# Patient Record
Sex: Female | Born: 1965 | Race: White | Hispanic: No | Marital: Married | Smoking: Former smoker
Health system: Southern US, Community
[De-identification: ages and names within clinical notes are randomized; demographics above are authoritative.]

## PROBLEM LIST (undated history)

## (undated) DIAGNOSIS — R3129 Other microscopic hematuria: Secondary | ICD-10-CM

## (undated) DIAGNOSIS — Z9889 Other specified postprocedural states: Secondary | ICD-10-CM

## (undated) DIAGNOSIS — Z7901 Long term (current) use of anticoagulants: Secondary | ICD-10-CM

## (undated) DIAGNOSIS — M1990 Unspecified osteoarthritis, unspecified site: Secondary | ICD-10-CM

## (undated) DIAGNOSIS — Z8742 Personal history of other diseases of the female genital tract: Secondary | ICD-10-CM

## (undated) DIAGNOSIS — Z87442 Personal history of urinary calculi: Secondary | ICD-10-CM

## (undated) DIAGNOSIS — R112 Nausea with vomiting, unspecified: Secondary | ICD-10-CM

## (undated) DIAGNOSIS — K602 Anal fissure, unspecified: Secondary | ICD-10-CM

## (undated) DIAGNOSIS — T7840XA Allergy, unspecified, initial encounter: Secondary | ICD-10-CM

## (undated) DIAGNOSIS — M25511 Pain in right shoulder: Secondary | ICD-10-CM

## (undated) DIAGNOSIS — I499 Cardiac arrhythmia, unspecified: Secondary | ICD-10-CM

## (undated) DIAGNOSIS — E785 Hyperlipidemia, unspecified: Secondary | ICD-10-CM

## (undated) DIAGNOSIS — E119 Type 2 diabetes mellitus without complications: Secondary | ICD-10-CM

## (undated) DIAGNOSIS — Z8489 Family history of other specified conditions: Secondary | ICD-10-CM

## (undated) DIAGNOSIS — Z8719 Personal history of other diseases of the digestive system: Secondary | ICD-10-CM

## (undated) DIAGNOSIS — G8929 Other chronic pain: Secondary | ICD-10-CM

## (undated) DIAGNOSIS — K589 Irritable bowel syndrome without diarrhea: Secondary | ICD-10-CM

## (undated) DIAGNOSIS — L719 Rosacea, unspecified: Secondary | ICD-10-CM

## (undated) DIAGNOSIS — I480 Paroxysmal atrial fibrillation: Secondary | ICD-10-CM

## (undated) DIAGNOSIS — F419 Anxiety disorder, unspecified: Secondary | ICD-10-CM

## (undated) DIAGNOSIS — E282 Polycystic ovarian syndrome: Secondary | ICD-10-CM

## (undated) DIAGNOSIS — N201 Calculus of ureter: Secondary | ICD-10-CM

## (undated) DIAGNOSIS — I10 Essential (primary) hypertension: Secondary | ICD-10-CM

## (undated) DIAGNOSIS — R011 Cardiac murmur, unspecified: Secondary | ICD-10-CM

## (undated) DIAGNOSIS — G2581 Restless legs syndrome: Secondary | ICD-10-CM

## (undated) HISTORY — DX: Anal fissure, unspecified: K60.2

## (undated) HISTORY — DX: Other microscopic hematuria: R31.29

## (undated) HISTORY — PX: CARPAL TUNNEL RELEASE: SHX101

## (undated) HISTORY — DX: Polycystic ovarian syndrome: E28.2

## (undated) HISTORY — DX: Irritable bowel syndrome, unspecified: K58.9

## (undated) HISTORY — DX: Essential (primary) hypertension: I10

## (undated) HISTORY — DX: Allergy, unspecified, initial encounter: T78.40XA

## (undated) HISTORY — PX: KNEE ARTHROSCOPY: SUR90

## (undated) HISTORY — DX: Hyperlipidemia, unspecified: E78.5

## (undated) HISTORY — PX: OTHER SURGICAL HISTORY: SHX169

## (undated) HISTORY — PX: INTRAUTERINE DEVICE (IUD) INSERTION: SHX5877

## (undated) HISTORY — DX: Unspecified osteoarthritis, unspecified site: M19.90

## (undated) HISTORY — PX: WISDOM TOOTH EXTRACTION: SHX21

## (undated) HISTORY — DX: Personal history of other diseases of the female genital tract: Z87.42

## (undated) HISTORY — DX: Type 2 diabetes mellitus without complications: E11.9

## (undated) HISTORY — PX: HAND TENDON SURGERY: SHX663

## (undated) HISTORY — DX: Rosacea, unspecified: L71.9

---

## 1997-01-13 ENCOUNTER — Encounter: Payer: Self-pay | Admitting: Internal Medicine

## 1997-01-13 LAB — CONVERTED CEMR LAB

## 1998-05-16 ENCOUNTER — Other Ambulatory Visit: Admission: RE | Admit: 1998-05-16 | Discharge: 1998-05-16 | Payer: Self-pay | Admitting: *Deleted

## 1999-09-23 ENCOUNTER — Other Ambulatory Visit: Admission: RE | Admit: 1999-09-23 | Discharge: 1999-09-23 | Payer: Self-pay | Admitting: *Deleted

## 2000-12-02 ENCOUNTER — Other Ambulatory Visit: Admission: RE | Admit: 2000-12-02 | Discharge: 2000-12-02 | Payer: Self-pay | Admitting: *Deleted

## 2002-04-03 ENCOUNTER — Other Ambulatory Visit: Admission: RE | Admit: 2002-04-03 | Discharge: 2002-04-03 | Payer: Self-pay | Admitting: Internal Medicine

## 2003-04-16 ENCOUNTER — Other Ambulatory Visit: Admission: RE | Admit: 2003-04-16 | Discharge: 2003-04-16 | Payer: Self-pay | Admitting: *Deleted

## 2003-04-29 ENCOUNTER — Encounter: Payer: Self-pay | Admitting: Internal Medicine

## 2004-12-13 HISTORY — PX: CARPAL TUNNEL RELEASE: SHX101

## 2004-12-21 ENCOUNTER — Other Ambulatory Visit: Admission: RE | Admit: 2004-12-21 | Discharge: 2004-12-21 | Payer: Self-pay | Admitting: Obstetrics and Gynecology

## 2005-05-12 ENCOUNTER — Ambulatory Visit (HOSPITAL_BASED_OUTPATIENT_CLINIC_OR_DEPARTMENT_OTHER): Admission: RE | Admit: 2005-05-12 | Discharge: 2005-05-12 | Payer: Self-pay | Admitting: *Deleted

## 2005-05-12 ENCOUNTER — Ambulatory Visit (HOSPITAL_COMMUNITY): Admission: RE | Admit: 2005-05-12 | Discharge: 2005-05-12 | Payer: Self-pay | Admitting: *Deleted

## 2005-12-22 ENCOUNTER — Ambulatory Visit: Payer: Self-pay | Admitting: Internal Medicine

## 2005-12-28 ENCOUNTER — Ambulatory Visit: Payer: Self-pay | Admitting: Internal Medicine

## 2006-02-01 ENCOUNTER — Encounter: Admission: RE | Admit: 2006-02-01 | Discharge: 2006-02-01 | Payer: Self-pay | Admitting: Orthopaedic Surgery

## 2006-02-08 ENCOUNTER — Encounter: Admission: RE | Admit: 2006-02-08 | Discharge: 2006-02-08 | Payer: Self-pay | Admitting: Orthopaedic Surgery

## 2006-09-15 ENCOUNTER — Other Ambulatory Visit: Admission: RE | Admit: 2006-09-15 | Discharge: 2006-09-15 | Payer: Self-pay | Admitting: Obstetrics and Gynecology

## 2006-11-11 ENCOUNTER — Encounter: Payer: Self-pay | Admitting: Internal Medicine

## 2006-11-11 ENCOUNTER — Ambulatory Visit: Payer: Self-pay | Admitting: Cardiology

## 2006-11-22 ENCOUNTER — Ambulatory Visit: Payer: Self-pay

## 2006-11-22 ENCOUNTER — Encounter: Payer: Self-pay | Admitting: Cardiology

## 2006-11-24 ENCOUNTER — Ambulatory Visit: Payer: Self-pay | Admitting: Cardiology

## 2007-01-02 ENCOUNTER — Ambulatory Visit: Payer: Self-pay | Admitting: Internal Medicine

## 2007-01-02 LAB — CONVERTED CEMR LAB
AST: 18 units/L (ref 0–37)
BUN: 12 mg/dL (ref 6–23)
Calcium: 9.2 mg/dL (ref 8.4–10.5)
Cholesterol: 214 mg/dL (ref 0–200)
Direct LDL: 153.5 mg/dL
GFR calc Af Amer: 90 mL/min
GFR calc non Af Amer: 74 mL/min
HDL: 47.4 mg/dL (ref >39.0)
Potassium: 5.2 meq/L — ABNORMAL HIGH (ref 3.5–5.1)
Total CHOL/HDL Ratio: 4.5
Triglycerides: 55 mg/dL (ref 0–149)
VLDL: 11 mg/dL (ref 0–40)

## 2007-06-19 ENCOUNTER — Ambulatory Visit: Payer: Self-pay | Admitting: Internal Medicine

## 2007-06-19 LAB — CONVERTED CEMR LAB
BUN: 14 mg/dL (ref 6–23)
Basophils Absolute: 0 10*3/uL (ref 0.0–0.1)
Basophils Relative: 0.8 % (ref 0.0–1.0)
CO2: 30 meq/L (ref 19–32)
Calcium: 9 mg/dL (ref 8.4–10.5)
Creatinine, Ser: 0.8 mg/dL (ref 0.4–1.2)
Direct LDL: 157 mg/dL
Free T4: 0.9 ng/dL (ref 0.6–1.6)
GFR calc non Af Amer: 85 mL/min
Glucose, Bld: 91 mg/dL (ref 70–99)
HDL: 39.5 mg/dL (ref >39.0)
Hgb A1c MFr Bld: 5.7 % (ref 4.6–6.0)
Lymphocytes Relative: 32 % (ref 12.0–46.0)
MCHC: 33.4 g/dL (ref 30.0–36.0)
Platelets: 214 10*3/uL (ref 150–400)
RDW: 11.8 % (ref 11.5–14.6)
Sodium: 142 meq/L (ref 135–145)
TSH: 1.91 microintl units/mL (ref 0.35–5.50)

## 2007-07-03 ENCOUNTER — Telehealth: Payer: Self-pay | Admitting: Internal Medicine

## 2007-07-18 ENCOUNTER — Encounter: Payer: Self-pay | Admitting: Internal Medicine

## 2007-07-18 DIAGNOSIS — I10 Essential (primary) hypertension: Secondary | ICD-10-CM | POA: Insufficient documentation

## 2007-07-18 DIAGNOSIS — E785 Hyperlipidemia, unspecified: Secondary | ICD-10-CM | POA: Insufficient documentation

## 2007-07-21 ENCOUNTER — Inpatient Hospital Stay (HOSPITAL_COMMUNITY): Admission: AD | Admit: 2007-07-21 | Discharge: 2007-07-21 | Payer: Self-pay | Admitting: Obstetrics and Gynecology

## 2007-07-24 ENCOUNTER — Telehealth: Payer: Self-pay | Admitting: Internal Medicine

## 2007-08-22 ENCOUNTER — Telehealth: Payer: Self-pay | Admitting: Internal Medicine

## 2007-10-16 ENCOUNTER — Ambulatory Visit: Payer: Self-pay | Admitting: Internal Medicine

## 2007-10-16 DIAGNOSIS — N979 Female infertility, unspecified: Secondary | ICD-10-CM | POA: Insufficient documentation

## 2007-10-16 DIAGNOSIS — E282 Polycystic ovarian syndrome: Secondary | ICD-10-CM | POA: Insufficient documentation

## 2007-10-18 ENCOUNTER — Encounter: Payer: Self-pay | Admitting: Internal Medicine

## 2007-10-24 LAB — CONVERTED CEMR LAB
CO2: 29 meq/L (ref 19–32)
Chloride: 105 meq/L (ref 96–112)
Cholesterol: 203 mg/dL (ref 0–200)
Direct LDL: 150.5 mg/dL
Potassium: 4.1 meq/L (ref 3.5–5.1)
Sodium: 142 meq/L (ref 135–145)
Total CHOL/HDL Ratio: 5.6
Triglycerides: 62 mg/dL (ref 0–149)
VLDL: 12 mg/dL (ref 0–40)

## 2007-11-20 ENCOUNTER — Ambulatory Visit: Payer: Self-pay | Admitting: Internal Medicine

## 2007-11-20 LAB — CONVERTED CEMR LAB: LDL Goal: 130 mg/dL

## 2007-11-22 ENCOUNTER — Encounter: Payer: Self-pay | Admitting: Internal Medicine

## 2007-11-23 ENCOUNTER — Telehealth: Payer: Self-pay | Admitting: Internal Medicine

## 2007-12-19 ENCOUNTER — Ambulatory Visit: Payer: Self-pay | Admitting: Internal Medicine

## 2008-01-15 ENCOUNTER — Ambulatory Visit: Payer: Self-pay | Admitting: Internal Medicine

## 2008-01-19 ENCOUNTER — Telehealth: Payer: Self-pay | Admitting: Internal Medicine

## 2008-02-06 ENCOUNTER — Telehealth: Payer: Self-pay | Admitting: Family Medicine

## 2008-03-18 ENCOUNTER — Telehealth: Payer: Self-pay | Admitting: Internal Medicine

## 2008-04-04 ENCOUNTER — Telehealth: Payer: Self-pay | Admitting: *Deleted

## 2008-04-05 ENCOUNTER — Telehealth: Payer: Self-pay | Admitting: Internal Medicine

## 2008-04-09 ENCOUNTER — Telehealth: Payer: Self-pay | Admitting: Internal Medicine

## 2008-04-12 ENCOUNTER — Ambulatory Visit: Payer: Self-pay | Admitting: Internal Medicine

## 2008-04-30 ENCOUNTER — Ambulatory Visit: Payer: Self-pay | Admitting: Gastroenterology

## 2008-04-30 DIAGNOSIS — M129 Arthropathy, unspecified: Secondary | ICD-10-CM | POA: Insufficient documentation

## 2008-04-30 LAB — CONVERTED CEMR LAB
Basophils Absolute: 0 10*3/uL (ref 0.0–0.1)
Basophils Relative: 0.4 % (ref 0.0–1.0)
Eosinophils Relative: 3.4 % (ref 0.0–5.0)
HCT: 43.9 % (ref 36.0–46.0)
Lymphocytes Relative: 22.8 % (ref 12.0–46.0)
Monocytes Relative: 8.4 % (ref 3.0–12.0)
Platelets: 219 10*3/uL (ref 150–400)
RBC: 4.65 M/uL (ref 3.87–5.11)
RDW: 11.8 % (ref 11.5–14.6)

## 2008-05-01 ENCOUNTER — Ambulatory Visit: Payer: Self-pay | Admitting: Gastroenterology

## 2008-05-07 ENCOUNTER — Telehealth: Payer: Self-pay | Admitting: Gastroenterology

## 2008-05-21 ENCOUNTER — Telehealth: Payer: Self-pay | Admitting: Gastroenterology

## 2008-05-23 ENCOUNTER — Telehealth: Payer: Self-pay | Admitting: Gastroenterology

## 2008-06-04 ENCOUNTER — Ambulatory Visit: Payer: Self-pay | Admitting: Gastroenterology

## 2008-07-22 ENCOUNTER — Telehealth: Payer: Self-pay | Admitting: Gastroenterology

## 2008-08-09 ENCOUNTER — Telehealth: Payer: Self-pay | Admitting: Gastroenterology

## 2008-08-20 ENCOUNTER — Ambulatory Visit: Payer: Self-pay | Admitting: Gastroenterology

## 2008-08-29 ENCOUNTER — Ambulatory Visit: Payer: Self-pay | Admitting: Internal Medicine

## 2008-09-02 ENCOUNTER — Telehealth: Payer: Self-pay | Admitting: Internal Medicine

## 2008-09-09 ENCOUNTER — Telehealth: Payer: Self-pay | Admitting: Gastroenterology

## 2008-09-09 ENCOUNTER — Telehealth: Payer: Self-pay | Admitting: *Deleted

## 2008-09-17 ENCOUNTER — Telehealth: Payer: Self-pay | Admitting: Internal Medicine

## 2008-09-30 ENCOUNTER — Ambulatory Visit: Payer: Self-pay | Admitting: Internal Medicine

## 2008-09-30 DIAGNOSIS — J209 Acute bronchitis, unspecified: Secondary | ICD-10-CM | POA: Insufficient documentation

## 2008-10-16 ENCOUNTER — Telehealth: Payer: Self-pay | Admitting: Gastroenterology

## 2008-10-23 ENCOUNTER — Ambulatory Visit: Payer: Self-pay | Admitting: Internal Medicine

## 2008-11-11 ENCOUNTER — Telehealth: Payer: Self-pay | Admitting: Internal Medicine

## 2008-11-18 ENCOUNTER — Ambulatory Visit: Payer: Self-pay | Admitting: Internal Medicine

## 2008-11-18 LAB — CONVERTED CEMR LAB
Bilirubin Urine: NEGATIVE
Blood in Urine, dipstick: NEGATIVE
Glucose, Urine, Semiquant: NEGATIVE
Ketones, urine, test strip: NEGATIVE
Nitrite: NEGATIVE
Protein, U semiquant: NEGATIVE
Specific Gravity, Urine: 1.02
Urobilinogen, UA: 0.2
WBC Urine, dipstick: NEGATIVE
pH: 7

## 2008-11-19 ENCOUNTER — Encounter: Payer: Self-pay | Admitting: Internal Medicine

## 2008-11-19 LAB — CONVERTED CEMR LAB
AST: 19 units/L (ref 0–37)
Albumin: 3.8 g/dL (ref 3.5–5.2)
BUN: 14 mg/dL (ref 6–23)
Basophils Absolute: 0 10*3/uL (ref 0.0–0.1)
Basophils Relative: 0.9 % (ref 0.0–3.0)
Calcium: 9 mg/dL (ref 8.4–10.5)
Cholesterol: 187 mg/dL (ref 0–200)
Creatinine, Ser: 0.7 mg/dL (ref 0.4–1.2)
Eosinophils Absolute: 0.2 10*3/uL (ref 0.0–0.7)
GFR calc non Af Amer: 99 mL/min
HCT: 43.5 % (ref 36.0–46.0)
Lymphocytes Relative: 29.4 % (ref 12.0–46.0)
MCHC: 34.9 g/dL (ref 30.0–36.0)
MCV: 94.7 fL (ref 78.0–100.0)
Monocytes Relative: 9.9 % (ref 3.0–12.0)
Neutrophils Relative %: 56.8 % (ref 43.0–77.0)
Platelets: 202 10*3/uL (ref 150–400)
RDW: 12.5 % (ref 11.5–14.6)
TSH: 2.3 microintl units/mL (ref 0.35–5.50)
Triglycerides: 37 mg/dL (ref 0–149)
WBC: 5.5 10*3/uL (ref 4.5–10.5)

## 2008-12-13 LAB — HM PAP SMEAR: HM Pap smear: NORMAL

## 2008-12-18 ENCOUNTER — Encounter: Admission: RE | Admit: 2008-12-18 | Discharge: 2008-12-18 | Payer: Self-pay | Admitting: Obstetrics and Gynecology

## 2008-12-26 ENCOUNTER — Telehealth: Payer: Self-pay | Admitting: *Deleted

## 2008-12-27 ENCOUNTER — Encounter: Payer: Self-pay | Admitting: Internal Medicine

## 2008-12-27 ENCOUNTER — Telehealth: Payer: Self-pay | Admitting: *Deleted

## 2009-01-24 ENCOUNTER — Telehealth: Payer: Self-pay | Admitting: Gastroenterology

## 2009-01-27 ENCOUNTER — Encounter: Payer: Self-pay | Admitting: Gastroenterology

## 2009-02-21 ENCOUNTER — Encounter: Admission: RE | Admit: 2009-02-21 | Discharge: 2009-02-21 | Payer: Self-pay | Admitting: Family Medicine

## 2009-03-10 ENCOUNTER — Telehealth: Payer: Self-pay | Admitting: Internal Medicine

## 2009-03-27 ENCOUNTER — Telehealth: Payer: Self-pay | Admitting: *Deleted

## 2009-04-21 ENCOUNTER — Ambulatory Visit: Payer: Self-pay | Admitting: Internal Medicine

## 2009-05-19 ENCOUNTER — Telehealth: Payer: Self-pay | Admitting: *Deleted

## 2009-06-05 ENCOUNTER — Telehealth: Payer: Self-pay | Admitting: Gastroenterology

## 2009-06-30 ENCOUNTER — Telehealth: Payer: Self-pay | Admitting: Internal Medicine

## 2009-07-15 ENCOUNTER — Ambulatory Visit: Payer: Self-pay | Admitting: Internal Medicine

## 2009-07-21 ENCOUNTER — Encounter: Payer: Self-pay | Admitting: Internal Medicine

## 2009-07-24 ENCOUNTER — Telehealth: Payer: Self-pay | Admitting: *Deleted

## 2009-09-17 ENCOUNTER — Telehealth: Payer: Self-pay | Admitting: *Deleted

## 2009-09-23 ENCOUNTER — Ambulatory Visit: Payer: Self-pay | Admitting: Internal Medicine

## 2009-09-23 DIAGNOSIS — R002 Palpitations: Secondary | ICD-10-CM | POA: Insufficient documentation

## 2009-09-23 DIAGNOSIS — Z87891 Personal history of nicotine dependence: Secondary | ICD-10-CM | POA: Insufficient documentation

## 2009-11-18 ENCOUNTER — Ambulatory Visit: Payer: Self-pay | Admitting: Internal Medicine

## 2009-11-18 LAB — CONVERTED CEMR LAB
Albumin: 4 g/dL (ref 3.5–5.2)
BUN: 10 mg/dL (ref 6–23)
CO2: 28 meq/L (ref 19–32)
Calcium: 9 mg/dL (ref 8.4–10.5)
Chloride: 102 meq/L (ref 96–112)
Cholesterol: 201 mg/dL — ABNORMAL HIGH (ref 0–200)
Creatinine, Ser: 1 mg/dL (ref 0.4–1.2)
Direct LDL: 154.4 mg/dL
Eosinophils Relative: 3 % (ref 0.0–5.0)
HDL: 40.1 mg/dL (ref >39.00)
Lymphs Abs: 1.1 10*3/uL (ref 0.7–4.0)
Monocytes Absolute: 0.7 10*3/uL (ref 0.1–1.0)
Monocytes Relative: 13.4 % — ABNORMAL HIGH (ref 3.0–12.0)
Neutrophils Relative %: 61.5 % (ref 43.0–77.0)
Platelets: 184 10*3/uL (ref 150.0–400.0)
RBC: 4.62 M/uL (ref 3.87–5.11)
RDW: 11.8 % (ref 11.5–14.6)
Total CHOL/HDL Ratio: 5
Total Protein: 7.4 g/dL (ref 6.0–8.3)
WBC: 5 10*3/uL (ref 4.5–10.5)

## 2009-11-25 ENCOUNTER — Ambulatory Visit: Payer: Self-pay | Admitting: Internal Medicine

## 2009-11-25 DIAGNOSIS — R7309 Other abnormal glucose: Secondary | ICD-10-CM | POA: Insufficient documentation

## 2009-12-24 ENCOUNTER — Telehealth: Payer: Self-pay | Admitting: *Deleted

## 2009-12-29 ENCOUNTER — Ambulatory Visit: Payer: Self-pay | Admitting: Internal Medicine

## 2009-12-29 DIAGNOSIS — J45909 Unspecified asthma, uncomplicated: Secondary | ICD-10-CM | POA: Insufficient documentation

## 2010-01-28 ENCOUNTER — Telehealth: Payer: Self-pay | Admitting: Internal Medicine

## 2010-01-28 ENCOUNTER — Ambulatory Visit: Payer: Self-pay | Admitting: Internal Medicine

## 2010-03-06 ENCOUNTER — Telehealth: Payer: Self-pay | Admitting: Internal Medicine

## 2010-03-10 ENCOUNTER — Ambulatory Visit: Payer: Self-pay | Admitting: Internal Medicine

## 2010-03-10 DIAGNOSIS — L255 Unspecified contact dermatitis due to plants, except food: Secondary | ICD-10-CM | POA: Insufficient documentation

## 2010-03-27 ENCOUNTER — Telehealth: Payer: Self-pay | Admitting: Internal Medicine

## 2010-05-25 ENCOUNTER — Ambulatory Visit: Payer: Self-pay | Admitting: Family Medicine

## 2010-07-08 ENCOUNTER — Ambulatory Visit (HOSPITAL_BASED_OUTPATIENT_CLINIC_OR_DEPARTMENT_OTHER): Admission: RE | Admit: 2010-07-08 | Discharge: 2010-07-08 | Payer: Self-pay | Admitting: Orthopedic Surgery

## 2010-07-08 HISTORY — PX: KNEE ARTHROSCOPY: SUR90

## 2010-10-13 ENCOUNTER — Encounter: Payer: Self-pay | Admitting: Internal Medicine

## 2010-10-13 LAB — CONVERTED CEMR LAB: Pap Smear: NORMAL

## 2010-11-06 ENCOUNTER — Encounter: Payer: Self-pay | Admitting: Internal Medicine

## 2010-11-09 ENCOUNTER — Telehealth: Payer: Self-pay | Admitting: Internal Medicine

## 2010-11-11 ENCOUNTER — Ambulatory Visit: Payer: Self-pay | Admitting: Internal Medicine

## 2010-11-11 DIAGNOSIS — J042 Acute laryngotracheitis: Secondary | ICD-10-CM | POA: Insufficient documentation

## 2010-11-11 DIAGNOSIS — R062 Wheezing: Secondary | ICD-10-CM | POA: Insufficient documentation

## 2010-11-13 ENCOUNTER — Telehealth: Payer: Self-pay | Admitting: Internal Medicine

## 2010-11-19 ENCOUNTER — Telehealth: Payer: Self-pay | Admitting: *Deleted

## 2010-11-20 ENCOUNTER — Telehealth: Payer: Self-pay | Admitting: *Deleted

## 2010-11-23 ENCOUNTER — Telehealth: Payer: Self-pay | Admitting: Internal Medicine

## 2010-12-10 ENCOUNTER — Ambulatory Visit: Payer: Self-pay | Admitting: Internal Medicine

## 2010-12-10 LAB — CONVERTED CEMR LAB
AST: 22 units/L (ref 0–37)
Albumin: 3.9 g/dL (ref 3.5–5.2)
Alkaline Phosphatase: 79 units/L (ref 39–117)
BUN: 15 mg/dL (ref 6–23)
Bilirubin, Direct: 0.1 mg/dL (ref 0.0–0.3)
CO2: 25 meq/L (ref 19–32)
Calcium: 8.6 mg/dL (ref 8.4–10.5)
Chloride: 101 meq/L (ref 96–112)
Creatinine, Ser: 0.8 mg/dL (ref 0.4–1.2)
Eosinophils Absolute: 0.2 10*3/uL (ref 0.0–0.7)
Eosinophils Relative: 2.9 % (ref 0.0–5.0)
GFR calc non Af Amer: 89.95 mL/min (ref >60.00)
MCV: 95.2 fL (ref 78.0–100.0)
Monocytes Relative: 10.4 % (ref 3.0–12.0)
Neutrophils Relative %: 58.5 % (ref 43.0–77.0)
Platelets: 242 10*3/uL (ref 150.0–400.0)
WBC: 5.8 10*3/uL (ref 4.5–10.5)

## 2011-01-05 ENCOUNTER — Encounter: Payer: Self-pay | Admitting: Internal Medicine

## 2011-01-05 ENCOUNTER — Ambulatory Visit
Admission: RE | Admit: 2011-01-05 | Discharge: 2011-01-05 | Payer: Self-pay | Source: Home / Self Care | Attending: Internal Medicine | Admitting: Internal Medicine

## 2011-01-05 DIAGNOSIS — E669 Obesity, unspecified: Secondary | ICD-10-CM | POA: Insufficient documentation

## 2011-01-08 ENCOUNTER — Ambulatory Visit
Admission: RE | Admit: 2011-01-08 | Discharge: 2011-01-08 | Payer: Self-pay | Source: Home / Self Care | Attending: Internal Medicine | Admitting: Internal Medicine

## 2011-01-10 LAB — CONVERTED CEMR LAB: Pap Smear: NORMAL

## 2011-01-14 NOTE — Progress Notes (Signed)
Summary: need refill on cardizem until mail order gets here  Phone Note Call from Patient Call back at [facility] Phone [phone]   Caller: Patient Summary of Call: Pt would like just [facility] pills [facility] due to her mail order company not getting it to her for a few days. Initial call taken by: Romualdo Bolk, CMA (AAMA),  December 24, 2009 2:05 PM  Follow-up for Phone Call        Pt aware that rx was sent. Follow-up by: Romualdo Bolk, CMA (AAMA),  December 24, 2009 2:06 PM    Prescriptions: CARDIZEM LA 180 MG  TB24 (DILTIAZEM HCL COATED BEADS) 1 by mouth once daily  #30 x 0   Entered by:   Romualdo Bolk, CMA (AAMA)   Authorized by:   Madelin Headings MD   Signed by:   Romualdo Bolk, CMA (AAMA) on 12/24/2009   Method used:   [facility] to        [facility] [phone]* (retail)       [address]       [place], [place]  [address]       Ph: [phone] or [phone]       Fax: [phone]   RxID:   [id number]

## 2011-01-14 NOTE — Progress Notes (Signed)
Summary: Pt needs to know if she should still use Albuterol?  Phone Note Call from Patient Call back at Home Phone [phone]   Caller: Patient Summary of Call: Pt is wondering if she can still use the Albuterol along with other meds given? Pls call.  Initial call taken by: Lucy Antigua,  November 13, 2010 3:52 PM  Follow-up for Phone Call        yes Follow-up by: Madelin Headings MD,  November 13, 2010 3:54 PM  Additional Follow-up for Phone Call Additional follow up Details #1::        called pt - informed what dr. Fabian Sharp said    KIK Additional Follow-up by: Duard Brady LPN,  November 13, 2010 5:26 PM

## 2011-01-14 NOTE — Assessment & Plan Note (Signed)
Summary: sore throat /wheezing/dm/pt rsc/cjr   Vital Signs:  Patient profile:   45 year old female Menstrual status:  irregular Weight:      183 pounds BMI:     36.47 O2 Sat:      97 % on Room air Temp:     98.6 degrees F oral Pulse rate:   106 / minute BP sitting:   172 / 84  (left arm) Cuff size:   regular  Vitals Entered By: Alfred Levins, CMA (November 11, 2010 1:42 PM)  O2 Flow:  Room air CC: st, wheezing, cough, laryngitis x1 wk   History of Present Illness: Gail [name] comes in today  for above . [facility] with inhaler etc.  now is "coughing so much  getting reflux" and coughing badly  that throat hurts a lot  last pm had 45 minute couhging fit .No current fever.  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  Taking albuterol and and  chlortrimeton.   and promethizine and not he[ing. as well as  tessalon perles   Bp had been normal.     but up in [facility].    Taking meds  needs refill   Preventive Screening-Counseling & Management  Alcohol-Tobacco     Alcohol drinks/day: <1     Alcohol type: wine     Smoking Status: quit     Year Quit: age 49  Caffeine-Diet-Exercise     Caffeine use/day: 0     Does Patient Exercise: no  Current Medications (verified): 1)  Cardizem La 180 Mg  Tb24 (Diltiazem Hcl Coated Beads) .Marland Kitchen.. 1 By Mouth Once Daily 2)  Zinc Oxide Cream 30 Grams One Tube .... Apply To Rectal Area Two Times A Day 3)  Miralax   Powd (Polyethylene Glycol 3350) .... Take One Scoop Mixed in 8 Ounces of Water in The Morning and At Bedtime. 4)  Asmanex 120 Metered Doses 220 Mcg/inh Aepb (Mometasone Furoate) .... 2 Puffs Two Times A Day 5)  Proair Hfa 108 (90 Base) Mcg/act Aers (Albuterol Sulfate) .... 2 Puffs Every 4 Hours As Needed 6)  Prilosec Otc 20 Mg Tbec (Omeprazole Magnesium) 7)  Boric Acid  Powd (Boric Acid) 8)  Mirena 20 Mcg/24hr Iud (Levonorgestrel) 9)  Nystatin 100000 Unit/gm Crea  (Nystatin)  Allergies (verified): 1)  ! Codeine 2)  Erythromycin Ethylsuccinate  Past History:  Past medical, surgical, family and social histories (including risk factors) reviewed, and no changes noted (except as noted below).  Past Medical History: Reviewed history from 01/28/2010 and no changes required. Hyperlipidemia Hypertension PCO Cardiology consult for palpitations nl echo Dr Diona Browner Anal Fissure Arthritis hx of allergies    as a child on shots but not   asthma.    rosacea?  Past Surgical History: Reviewed history from 06/04/2008 and no changes required. Carpal tunnel release x2 (bilateral) Wisdom teeth extraction Left knee arthroscopy Wrist tendon release bilateral thumbs  Family History: Reviewed history from 10/23/2008 and no changes required. Family History of CAD Female 1st degree relative  Family History Hypertension Family History Lung cancer uncle Family History of Breast Cancer: Great Aunt No FH of Colon Cancer     Social History: Reviewed history from 04/21/2009 and no changes required. Occupation:early childcare worker   Married Former Smoker-stopped at age 13 Alcohol Use - yes 3 drinks in a month Caffeine Use 2-3 drinks a month Illicit Drug Use - no  Patient does not  get regular exercise.    goin to school also for degree has new pet    Review of Systems       The patient complains of anorexia, hoarseness, chest pain, and prolonged cough.  The patient denies fever, weight loss, vision loss, decreased hearing, syncope, dyspnea on exertion, peripheral edema, headaches, abdominal pain, melena, difficulty walking, abnormal bleeding, enlarged lymph nodes, and angioedema.    Physical Exam  General:  very hoarse   in nad  hard to talk   no stridor   Head:  Normocephalic and atraumatic without obvious abnormalities. No apparent alopecia or balding. Eyes:  clear  Ears:  External ear exam shows no significant lesions or deformities.  Otoscopic  examination reveals clear canals, tympanic membranes are intact bilaterally without bulging, retraction, inflammation or discharge. Hearing is grossly normal bilaterally. Nose:  no external deformity and no external erythema.  mild congestion and face onn tender Mouth:  pharynx pink and moist.  no edema and nl airway Neck:  No deformities, masses, or tenderness noted. Lungs:  no intercostal retractions, no dullness, and no fremitus.  upper airway  wheeziness  with forced expiration  ? if wheezing lower.  soome improved after neb but very hoarse anduncomfortable Heart:  normal rate, regular rhythm, no murmur, and no gallop.   Abdomen:  Bowel sounds positive,abdomen soft and non-tender without masses, organomegaly or hernias noted. Pulses:  nl cap refill  Neurologic:  non focal  Skin:  turgor normal and color normal.   Cervical Nodes:  No lymphadenopathy noted Psych:  Oriented X3, good eye contact, not anxious appearing, and not depressed appearing.     Impression & Recommendations:  Problem # 1:  LARYNGOTRACHEITIS, ACUTE (ICD-464.20) severe and exposures  viral  vs atypicals   with..post coughing emmesis  but no stridore  some response to neb,     all cough measures not helpful   Problem # 2:  WHEEZING (ICD-786.07)  see above .  Orders: Nebulizer Tx (32440)  Problem # 3:  HYPERTENSION (ICD-401.9)  has been ol  otherwise     will recheck out of office  Her updated medication list for this problem includes:    Cardizem La 180 Mg Tb24 (Diltiazem hcl coated beads) .Marland Kitchen... 1 by mouth once daily  BP today: 172/84 Prior BP: 150/82 (05/25/2010)  Prior 10 Yr Risk Heart Disease: 6 % (07/15/2009)  Labs Reviewed: K+: 3.4 (11/18/2009) Creat: : 1.0 (11/18/2009)   Chol: 201 (11/18/2009)   HDL: 40.10 (11/18/2009)   LDL: 136 (11/18/2008)   TG: 61.0 (11/18/2009)  Problem # 4:  ASTHMA (ICD-493.90)  The following medications were removed from the medication list:    Prednisone 20 Mg Tabs  (Prednisone) .Marland Kitchen... Take 3 by mouth  once daily x 3 day ,2 po qd x 3 day,1 by mouth once daily x 3d, 1/2 by mouth  for 3 days or as directed Her updated medication list for this problem includes:    Asmanex 120 Metered Doses 220 Mcg/inh Aepb (Mometasone furoate) .Marland Kitchen... 2 puffs two times a day    Proair Hfa 108 (90 Base) Mcg/act Aers (Albuterol sulfate) .Marland Kitchen... 2 puffs every 4 hours as needed    Prednisone 20 Mg Tabs (Prednisone) .Marland Kitchen... Take  3 by mouth once daily  for 2 days then 2 by mouth once daily for 3 days or as directed  Orders: Nebulizer Tx (10272)  Problem # 5:  gerd adding to signs   taking  otc zantac to help  Complete Medication List: 1)  Cardizem La 180 Mg Tb24 (Diltiazem hcl coated beads) .Marland Kitchen.. 1 by mouth once daily 2)  Zinc Oxide Cream 30 Grams One Tube  .... Apply to rectal area two times a day 3)  Miralax Powd (Polyethylene glycol 3350) .... Take one scoop mixed in 8 ounces of water in the morning and at bedtime. 4)  Asmanex 120 Metered Doses 220 Mcg/inh Aepb (Mometasone furoate) .... 2 puffs two times a day 5)  Proair Hfa 108 (90 Base) Mcg/act Aers (Albuterol sulfate) .... 2 puffs every 4 hours as needed 6)  Prilosec Otc 20 Mg Tbec (Omeprazole magnesium) 7)  Boric Acid Powd (Boric acid) 8)  Mirena 20 Mcg/24hr Iud (Levonorgestrel) 9)  Nystatin 100000 Unit/gm Crea (Nystatin) 10)  Azithromycin 250 Mg Tabs (Azithromycin) .... 2po x 1 then 1 by mouth once daily for 4 more days 11)  Prednisone 20 Mg Tabs (Prednisone) .... Take  3 by mouth once daily  for 2 days then 2 by mouth once daily for 3 days or as directed  Patient Instructions: 1)  fluids  and  relative voice rest   until better  2)  meds  a sdicussed  3)  call if fever  shortness of breath .   4)  expect improvement in 5 days  . cough may last longer.   Prescriptions: CARDIZEM LA 180 MG  TB24 (DILTIAZEM HCL COATED BEADS) 1 by mouth once daily  #30 Tablet x 3   Entered and Authorized by:   Madelin Headings MD   Signed  by:   Madelin Headings MD on 11/11/2010   Method used:   Electronically to        [facility] [id number]* (retail)       [address] [place], [place]  [address]       Ph: [phone]       Fax: [phone]   RxID:   [id number] PREDNISONE 20 MG TABS (PREDNISONE) take  3 by mouth once daily  for 2 days then 2 by mouth once daily for 3 days or as directed  #20 x 0   Entered and Authorized by:   Madelin Headings MD   Signed by:   Madelin Headings MD on 11/11/2010   Method used:   Electronically to        [facility] [id number]* (retail)       [address] [place], [place]  [address]       Ph: [phone]       Fax: [phone]   RxID:   [id number] AZITHROMYCIN 250 MG TABS (AZITHROMYCIN) 2po x 1 then 1 by mouth once daily for 4 more days  #6 x 0   Entered and Authorized by:   Madelin Headings MD   Signed by:   Madelin Headings MD on 11/11/2010   Method used:   Electronically to        [facility] [address] (retail)       [address] [place], [place]  [address]       Ph: [phone]       Fax: [phone]   RxID:   [id number]    Orders Added: 1)  Est. Patient Level IV [26948] 2)  Nebulizer Tx [phone]

## 2011-01-14 NOTE — Assessment & Plan Note (Signed)
Summary: diarrhea/ssc   Vital Signs:  Patient profile:   45 year old female Menstrual status:  irregular Height:      59.5 inches Weight:      177 pounds BMI:     35.28 Temp:     98.8 degrees F oral Pulse rate:   78 / minute BP sitting:   140 / 80  (left arm) Cuff size:   regular  Vitals Entered By: Romualdo Bolk, CMA (AAMA) (January 28, 2010 9:24 AM) CC: This started on 2/9 with nausea, vomiting and fever. Then 2/10 had diarrhea and abd. cramp and has had this since then. Ate Brat diet.   History of Present Illness: Gail [name] comesin today for  acute work in . Onset with myalgias and vomiting  last week  and then  developed diarrhea   ...was out 2 days of work.   then 3 days ago  ate light and just  had diarrhea     Some improvement and beginning to eat reg food.   36 hours ago but had abd cramps.   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    went out to eat and had pain after  reg food.  Increasing diarrhea again.   watery stool      about 6  some togetther.  No recent fever.  No diarrhea in the daycare.  both parents have had Gi NVD  in the past weeks.   On rosacea  to start  doxy soon.   on metrogel generic   .    Preventive Screening-Counseling & Management  Alcohol-Tobacco     Alcohol drinks/day: <1     Alcohol type: wine     Smoking Status: quit     Year Quit: age 80  Caffeine-Diet-Exercise     Caffeine use/day: 0     Does Patient Exercise: no  Current Medications (verified): 1)  Cardizem La 180 Mg  Tb24 (Diltiazem Hcl Coated Beads) .Marland Kitchen.. 1 By Mouth Once Daily 2)  Zinc Oxide Cream 30 Grams One Tube .... Apply To Rectal Area Two Times A Day 3)  Miralax   Powd (Polyethylene Glycol 3350) .... Take One Scoop Mixed in 8 Ounces of Water in The Morning and At Bedtime. 4)  Benefiber   Powd (Wheat Dextrin) .... Take 1 Scoop Dissolved in Water Once Daily 5)  Asmanex 120 Metered Doses 220 Mcg/inh Aepb (Mometasone Furoate) .... 2 Puffs Two Times A Day 6)   Proair Hfa 108 (90 Base) Mcg/act Aers (Albuterol Sulfate) 7)  Prilosec Otc 20 Mg Tbec (Omeprazole Magnesium) 8)  Boric Acid  Powd (Boric Acid) 9)  Mirena 20 Mcg/24hr Iud (Levonorgestrel) 10)  Nystatin 100000 Unit/gm Crea (Nystatin) 11)  Verapamil Hcl  Powd (Verapamil Hcl) Gel .... Apply A Small Amount To Anal Fissure Two To Three Times A Day For 2-3 Weeks. 12)  Benzonatate 100 Mg Caps (Benzonatate) .... Take 1-2 By Mouth Three Times A Day As Needed Cough  Allergies (verified): 1)  ! Codeine 2)  Erythromycin Ethylsuccinate  Past History:  Past medical, surgical, family and social histories (including risk factors) reviewed, and no changes noted (except as noted below).  Past Medical History: Hyperlipidemia Hypertension PCO Cardiology consult for palpitations nl echo Dr Diona Browner Anal Fissure Arthritis hx of allergies    as a child on shots but not   asthma.    rosacea?  Past Surgical History: Reviewed history from 06/04/2008 and no changes required. Carpal tunnel release x2 (  bilateral) Wisdom teeth extraction Left knee arthroscopy Wrist tendon release bilateral thumbs  Past History:  Care Management: Gynecology: [facility]: [facility]   Marylene Buerger.  Family History: Reviewed history from 10/23/2008 and no changes required. Family History of CAD Female 1st degree relative  Family History Hypertension Family History Lung cancer uncle Family History of Breast Cancer: Great Aunt No FH of Colon Cancer     Social History: Reviewed history from 04/21/2009 and no changes required. Occupation:early childcare worker   Married Former Smoker-stopped at age 40 Alcohol Use - yes 3 drinks in a month Caffeine Use 2-3 drinks a month Illicit Drug Use - no  Patient does not get regular exercise.    goin to school also for degree  Review of Systems       The patient complains of anorexia.  The patient denies fever, weight loss, weight gain, vision loss,  decreased hearing, hoarseness, chest pain, syncope, dyspnea on exertion, peripheral edema, prolonged cough, headaches, hemoptysis, melena, hematochezia, severe indigestion/heartburn, hematuria, incontinence, difficulty walking, unusual weight change, abnormal bleeding, enlarged lymph nodes, and angioedema.    Physical Exam  General:  midly ill in nad   non toxic Head:  normocephalic and atraumatic.   Eyes:  vision grossly intact, pupils equal, and pupils round.   Ears:  R ear normal and L ear normal.   Nose:  no external deformity, no external erythema, and no nasal discharge.   Mouth:  good dentition and pharynx pink and moist.   Lungs:  Normal respiratory effort, chest expands symmetrically. Lungs are clear to auscultation, no crackles or wheezes.no dullness.   Heart:  Normal rate and regular rhythm. S1 and S2 normal without gallop, murmur, click, rub or other extra sounds.no lifts.   Abdomen:  Bowel sounds positive,abdomen soft and  without masses, organomegaly or hernias noted.no distention, no guarding, no rigidity, and no rebound tenderness.   Pulses:  nl cap refill  Extremities:  no clubbing cyanosis or edema  Neurologic:  non focal alert & oriented X3 and gait normal.   Skin:  turgor normal, color normal, no ecchymoses, and no petechiae.   Cervical Nodes:  No lymphadenopathy noted Psych:  Oriented X3, good eye contact, not anxious appearing, and not depressed appearing.     Impression & Recommendations:  Problem # 1:  DIARRHEA (ICD-787.91) suspect viral or norovirus that is in the community . 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 currently contiue symptom rx .  get stool culture if persistent and progressive  call about this.  note for work.   Complete Medication List: 1)  Cardizem La 180 Mg Tb24 (Diltiazem hcl coated beads) .Marland Kitchen.. 1 by mouth once daily 2)  Zinc Oxide Cream 30 Grams One Tube  .... Apply to rectal area two times a day 3)  Miralax Powd (Polyethylene glycol  3350) .... Take one scoop mixed in 8 ounces of water in the morning and at bedtime. 4)  Benefiber Powd (Wheat dextrin) .... Take 1 scoop dissolved in water once daily 5)  Asmanex 120 Metered Doses 220 Mcg/inh Aepb (Mometasone furoate) .... 2 puffs two times a day 6)  Proair Hfa 108 (90 Base) Mcg/act Aers (Albuterol sulfate) 7)  Prilosec Otc 20 Mg Tbec (Omeprazole magnesium) 8)  Boric Acid Powd (Boric acid) 9)  Mirena 20 Mcg/24hr Iud (Levonorgestrel) 10)  Nystatin 100000 Unit/gm Crea (Nystatin) 11)  Verapamil Hcl Powd (verapamil Hcl) Gel  .... Apply a small amount to anal fissure two to three  times a day for 2-3 weeks. 12)  Benzonatate 100 Mg Caps (Benzonatate) .... Take 1-2 by mouth three times a day as needed cough 13)  Promethazine Hcl 25 Mg Tabs (Promethazine hcl) .Marland Kitchen.. 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    2)  if getting worse   tomorrow call and we can arrange    stool culture.. 3)  avoid caffiene sweet beverages  fried and heavy foods.  4)  Do not go out to eat until better.  Prescriptions: PROMETHAZINE HCL 25 MG TABS (PROMETHAZINE HCL) 1 by mouth q4-6 hours  for nausea and vomiting  #15 x 0   Entered and Authorized by:   Madelin Headings MD   Signed by:   Madelin Headings MD on 01/28/2010   Method used:   [facility]        [facility] [phone]* (retail)       [address]       [place], [place]  [address]       Ph: [phone] or [phone]       Fax: [phone]   RxID:   [id number]

## 2011-01-14 NOTE — Progress Notes (Signed)
Summary: Not any better- please triage  Phone Note Call from Patient Call back at Home Phone [phone]   Caller: Patient Summary of Call: [facility] We did recieve the records. Pt is still not any better. They done a strep test and told her that they were neg. Pt still has a sore throat, no voice and coughing. Pt is taking promethazine cough syrup, tessalone pearls, and proventil inh. Pt was told by [facility] that she needs an antibiotic.  Initial call taken by: Romualdo Bolk, CMA (AAMA),  November 09, 2010 11:03 AM  Follow-up for Phone Call        Left message to call back or come in tomorrow for OV. Follow-up by: Lynann Beaver CMA AAMA,  November 09, 2010 11:07 AM

## 2011-01-14 NOTE — Assessment & Plan Note (Signed)
Summary: COUGH, URI? // RS   Vital Signs:  Patient profile:   45 year old female Menstrual status:  irregular Weight:      182 pounds O2 Sat:      98 % on Room air Temp:     98.4 degrees F Pulse rate:   122 / minute BP sitting:   120 / 80  (right arm) Cuff size:   regular  Vitals Entered By: Romualdo Bolk, CMA (AAMA) (December 29, 2009 9:52 AM)  O2 Flow:  Room air CC: Coughing x 1 week with congestion and some chest tighness and wheezing that started on 1/16   History of Present Illness: Gail [name] comes in today for   acute problem.  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   head is improved but still drainage.  Tried promethizine at night.    No  fever.    now couging up yellow phlegm  no blood.   No cp sob or exacerbation of her wheezing . No NVD. NO rashes . RSV is in the infant room at work.  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   BP Palpitations ..seem to be stable. Hx of asthmatic bronchitis prev assoicated with mold in previous residence.   Preventive Screening-Counseling & Management  Alcohol-Tobacco     Alcohol drinks/day: <1     Alcohol type: wine     Smoking Status: quit     Year Quit: age 35  Caffeine-Diet-Exercise     Caffeine use/day: 0     Does Patient Exercise: no  Current Medications (verified): 1)  Cardizem La 180 Mg  Tb24 (Diltiazem Hcl Coated Beads) .Marland Kitchen.. 1 By Mouth Once Daily 2)  Zinc Oxide Cream 30 Grams One Tube .... Apply To Rectal Area Two Times A Day 3)  Miralax   Powd (Polyethylene Glycol 3350) .... Take One Scoop Mixed in 8 Ounces of Water in The Morning and At Bedtime. 4)  Benefiber   Powd (Wheat Dextrin) .... Take 1 Scoop Dissolved in Water Once Daily 5)  Asmanex 120 Metered Doses 220 Mcg/inh Aepb (Mometasone Furoate) .... 2 Puffs Two Times A Day 6)  Proair Hfa 108 (90 Base) Mcg/act Aers (Albuterol Sulfate) 7)  Prilosec Otc 20 Mg Tbec  (Omeprazole Magnesium) 8)  Boric Acid  Powd (Boric Acid) 9)  Mirena 20 Mcg/24hr Iud (Levonorgestrel) 10)  Nystatin 100000 Unit/gm Crea (Nystatin) 11)  Cardizem La 180 Mg Xr24h-Tab (Diltiazem Hcl Coated Beads) .Marland Kitchen.. 1 By Mouth Once Daily 12)  Amoxicillin 500 Mg Tabs (Amoxicillin) .Marland Kitchen.. 1 By Mouth Two Times A Day 13)  Verapamil Hcl  Powd (Verapamil Hcl) Gel .... Apply A Small Amount To Anal Fissure Two To Three Times A Day For 2-3 Weeks.  Allergies (verified): 1)  ! Codeine 2)  Erythromycin Ethylsuccinate  Past History:  Past medical, surgical, family and social histories (including risk factors) reviewed, and no changes noted (except as noted below).  Past Medical History: Reviewed history from 07/15/2009 and no changes required. Hyperlipidemia Hypertension PCO Cardiology consult for palpitations nl echo Dr Diona Browner Anal Fissure Arthritis hx of allergies    as a child on shots but not   asthma.     Past Surgical History: Reviewed history from 06/04/2008 and no changes required. Carpal tunnel release x2 (bilateral) Wisdom teeth extraction Left knee arthroscopy Wrist tendon release bilateral thumbs  Family History: Reviewed history from 10/23/2008 and no changes required. Family History of CAD Female 1st degree relative  Family History Hypertension Family History Lung cancer uncle Family History of Breast Cancer: Great Aunt No FH of Colon Cancer     Social History: Reviewed history from 04/21/2009 and no changes required. Occupation:early childcare worker   Married Former Smoker-stopped at age 34 Alcohol Use - yes 3 drinks in a month Caffeine Use 2-3 drinks a month Illicit Drug Use - no  Patient does not get regular exercise.    goin to school also for degree  Review of Systems  The patient denies anorexia, fever, weight loss, weight gain, vision loss, decreased hearing, chest pain, prolonged cough, abdominal pain, difficulty walking, unusual weight change, abnormal  bleeding, and enlarged lymph nodes.    Physical Exam  General:  mildly hoarse  dry  ittitative bronchial cough in nad  Head:  Normocephalic and atraumatic without obvious abnormalities. No apparent alopecia or balding. Eyes:  PERRL, EOMs full, conjunctiva clear  Ears:  R ear normal, L ear normal, and no external deformities.   Nose:  mild congestion    Mouth:  pharynx pink and moist.   Neck:  No deformities, masses, or tenderness noted. Lungs:  Normal respiratory effort, chest expands symmetrically. Lungs are clear to auscultation, no crackles or wheezes.no dullness.   Heart:  Normal rate and regular rhythm. S1 and S2 normal without gallop, murmur, click, rub or other extra sounds.no lifts.   Skin:  nl cap refill   Cervical Nodes:  No lymphadenopathy noted   Impression & Recommendations:  Problem # 1:  COUGH (ICD-786.2) poss exposure to RSV  in infant rooom   could be   c/w this.       Problem # 2:  ASTHMA (ICD-493.90) seems stable but at risk   Her updated medication list for this problem includes:    Asmanex 120 Metered Doses 220 Mcg/inh Aepb (Mometasone furoate) .Marland Kitchen... 2 puffs two times a day    Proair Hfa 108 (90 Base) Mcg/act Aers (Albuterol sulfate)  Problem # 3:  PALPITATIONS, RECURRENT (ICD-785.1) Assessment: Improved  Problem # 4:  HYPERTENSION (ICD-401.9) Assessment: Unchanged stable for now.  Her updated medication list for this problem includes:    Cardizem La 180 Mg Tb24 (Diltiazem hcl coated beads) .Marland Kitchen... 1 by mouth once daily    Cardizem La 180 Mg Xr24h-tab (Diltiazem hcl coated beads) .Marland Kitchen... 1 by mouth once daily  Complete Medication List: 1)  Cardizem La 180 Mg Tb24 (Diltiazem hcl coated beads) .Marland Kitchen.. 1 by mouth once daily 2)  Zinc Oxide Cream 30 Grams One Tube  .... Apply to rectal area two times a day 3)  Miralax Powd (Polyethylene glycol 3350) .... Take one scoop mixed in 8 ounces of water in the morning and at bedtime. 4)  Benefiber Powd (Wheat dextrin) ....  Take 1 scoop dissolved in water once daily 5)  Asmanex 120 Metered Doses 220 Mcg/inh Aepb (Mometasone furoate) .... 2 puffs two times a day 6)  Proair Hfa 108 (90 Base) Mcg/act Aers (Albuterol sulfate) 7)  Prilosec Otc 20 Mg Tbec (Omeprazole magnesium) 8)  Boric Acid Powd (Boric acid) 9)  Mirena 20 Mcg/24hr Iud (Levonorgestrel) 10)  Nystatin 100000 Unit/gm Crea (Nystatin) 11)  Cardizem La 180 Mg Xr24h-tab (Diltiazem hcl coated beads) .Marland Kitchen.. 1 by mouth once daily 12)  Amoxicillin 500 Mg Tabs (Amoxicillin) .Marland Kitchen.. 1 by mouth two times a day 13)  Verapamil Hcl Powd (verapamil Hcl) Gel  .Marland KitchenMarland KitchenMarland Kitchen  Apply a small amount to anal fissure two to three times a day for 2-3 weeks. 14)  Benzonatate 100 Mg Caps (Benzonatate) .... Take 1-2 by mouth three times a day as needed cough 15)  Zithromax 250 Mg Tabs (Azithromycin) .... Take 2 then 1 by mouth once daily  Patient Instructions: 1)  I think this is viral  but possible atypical bacteria  .  2)  Acute Bronchitis symptoms for less then 10 days are not  helped by antibiotics. Take over the counter cough medications. Call if no improvement in 5-7 days, sooner if increasing cough, fever, or new symptoms ( shortness of breath, chest pain) .  3)  Can add antibiotic if not improving in the next 3-4 days. 4)  Chlorpheniramine antihistamin OTC may be helpful for drainage and help cough ( they come in 4-6 hour and 12 hours and could cause drowsiness. Prescriptions: ZITHROMAX 250 MG TABS (AZITHROMYCIN) take 2 then 1 by mouth once daily  #6 x 0   Entered and Authorized by:   Madelin Headings MD   Signed by:   Madelin Headings MD on 12/29/2009   Method used:   Print then Give to Patient   RxID:   [id number] BENZONATATE 100 MG CAPS (BENZONATATE) take 1-2 by mouth three times a day as needed cough  #40 x 1   Entered and Authorized by:   Madelin Headings MD   Signed by:   Madelin Headings MD on 12/29/2009   Method used:   Electronically to        [facility] [address]  (retail)       [address] [place], [place]  [address]       Ph: [phone]       Fax: [phone]   RxID:   [id number]

## 2011-01-14 NOTE — Assessment & Plan Note (Signed)
Summary: poison ivy all over/pt req shot of prednisone/cjr   Vital Signs:  Patient profile:   45 year old female Menstrual status:  irregular Weight:      182 pounds Pulse rate:   72 / minute BP sitting:   150 / 100  (left arm) Cuff size:   regular  Vitals Entered By: Romualdo Bolk, CMA Duncan Dull) (March 10, 2010 2:32 PM) CC: Poison oak all over body x 1 week.   History of Present Illness: Gail [name] comesin today for   fu of PI  rx over the phone with topical rx steroids.  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 Itching on trunk and arms  none on face.  Left knee pain and arthritis ? asks about  rx  for this .  had exercise in the past ? what to take.   Bp had been good but rushing today. Asthma : stable  Preventive Screening-Counseling & Management  Alcohol-Tobacco     Alcohol drinks/day: <1     Alcohol type: wine     Smoking Status: quit     Year Quit: age 22  Caffeine-Diet-Exercise     Caffeine use/day: 0     Does Patient Exercise: no  Current Medications (verified): 1)  Cardizem La 180 Mg  Tb24 (Diltiazem Hcl Coated Beads) .Marland Kitchen.. 1 By Mouth Once Daily 2)  Zinc Oxide Cream 30 Grams One Tube .... Apply To Rectal Area Two Times A Day 3)  Miralax   Powd (Polyethylene Glycol 3350) .... Take One Scoop Mixed in 8 Ounces of Water in The Morning and At Bedtime. 4)  Benefiber   Powd (Wheat Dextrin) .... Take 1 Scoop Dissolved in Water Once Daily 5)  Asmanex 120 Metered Doses 220 Mcg/inh Aepb (Mometasone Furoate) .... 2 Puffs Two Times A Day 6)  Proair Hfa 108 (90 Base) Mcg/act Aers (Albuterol Sulfate) 7)  Prilosec Otc 20 Mg Tbec (Omeprazole Magnesium) 8)  Boric Acid  Powd (Boric Acid) 9)  Mirena 20 Mcg/24hr Iud (Levonorgestrel) 10)  Nystatin 100000 Unit/gm Crea (Nystatin) 11)  Verapamil Hcl  Powd (Verapamil Hcl) Gel .... Apply A Small Amount  To Anal Fissure Two To Three Times A Day For 2-3 Weeks. 12)  Benzonatate 100 Mg Caps (Benzonatate) .... Take 1-2 By Mouth Three Times A Day As Needed Cough 13)  Promethazine Hcl 25 Mg Tabs (Promethazine Hcl) .Marland Kitchen.. 1 By Mouth Q4-6 Hours  For Nausea and Vomiting 14)  Fluocinonide 0.05 % Crea (Fluocinonide) .... Use Two Times A Day To Poison Ivy Not On Face  Allergies (verified): 1)  ! Codeine 2)  Erythromycin Ethylsuccinate  Past History:  Past medical, surgical, family and social histories (including risk factors) reviewed for relevance to current acute and chronic problems.  Past Medical History: Reviewed history from 01/28/2010 and no changes required. Hyperlipidemia Hypertension PCO Cardiology consult for palpitations nl echo Dr Diona Browner Anal Fissure Arthritis hx of allergies    as a child on shots but not   asthma.    rosacea?  Past Surgical History: Reviewed history from 06/04/2008 and no changes required. Carpal tunnel release x2 (bilateral) Wisdom teeth extraction Left knee arthroscopy Wrist tendon release bilateral thumbs  Past History:  Care Management: Gynecology: [facility]: [facility]   Marylene Buerger.  Family History: Reviewed history from 10/23/2008 and no changes required.  Family History of CAD Female 1st degree relative  Family History Hypertension Family History Lung cancer uncle Family History of Breast Cancer: Great Aunt No FH of Colon Cancer     Social History: Reviewed history from 04/21/2009 and no changes required. Occupation:early childcare worker   Married Former Smoker-stopped at age 60 Alcohol Use - yes 3 drinks in a month Caffeine Use 2-3 drinks a month Illicit Drug Use - no  Patient does not get regular exercise.    goin to school also for degree  Review of Systems  The patient denies anorexia, fever, weight loss, weight gain, vision loss, chest pain, syncope, dyspnea on exertion, peripheral edema, abdominal pain,  muscle weakness, difficulty walking, abnormal bleeding, enlarged lymph nodes, and angioedema.         hard to sleep cause of itching  Physical Exam  General:  Well-developed,well-nourished,in no acute distress; alert,appropriate and cooperative throughout examination Head:  normocephalic and atraumatic.   Msk:  mildly antalgic gait  Pulses:  pulses intact without delay   Skin:  turgor normal, color normal, no ecchymoses, and no petechiae.  patches or red excoriated areas  on trunk upper back proximal thighs  and one area left breast  face looks ok faced area on arms . No edema . Cervical Nodes:  No lymphadenopathy noted Psych:  Oriented X3, good eye contact, not anxious appearing, and not depressed appearing.     Impression & Recommendations:  Problem # 1:  RHUS DERMATITIS (ICD-692.6) Assessment New failed  topicals    disc options and will do oral CS    avoidance and prevention counseled  Her updated medication list for this problem includes:    Promethazine Hcl 25 Mg Tabs (Promethazine hcl) .Marland Kitchen... 1 by mouth q4-6 hours  for nausea and vomiting    Fluocinonide 0.05 % Crea (Fluocinonide) ..... Use two times a day to poison ivy not on face    Prednisone 20 Mg Tabs (Prednisone) .Marland Kitchen... Take 3 by mouth  once daily x 3 day ,2 po qd x 3 day,1 by mouth once daily x 3d, 1/2 by mouth  for 3 days or as directed  Problem # 2:  KNEE PAIN (UJW-119.14) Assessment: New disc   about  poss exercises to prevent further eterioration and should check with her specialist.   Problem # 3:  HYPERTENSION (ICD-401.9) up today  recheck  and call if not controlled  Her updated medication list for this problem includes:    Cardizem La 180 Mg Tb24 (Diltiazem hcl coated beads) .Marland Kitchen... 1 by mouth once daily  Complete Medication List: 1)  Cardizem La 180 Mg Tb24 (Diltiazem hcl coated beads) .Marland Kitchen.. 1 by mouth once daily 2)  Zinc Oxide Cream 30 Grams One Tube  .... Apply to rectal area two times a day 3)  Miralax Powd  (Polyethylene glycol 3350) .... Take one scoop mixed in 8 ounces of water in the morning and at bedtime. 4)  Benefiber Powd (Wheat dextrin) .... Take 1 scoop dissolved in water once daily 5)  Asmanex 120 Metered Doses 220 Mcg/inh Aepb (Mometasone furoate) .... 2 puffs two times a day 6)  Proair Hfa 108 (90 Base) Mcg/act Aers (Albuterol sulfate) 7)  Prilosec Otc 20 Mg Tbec (Omeprazole magnesium) 8)  Boric Acid Powd (Boric acid) 9)  Mirena 20 Mcg/24hr Iud (Levonorgestrel) 10)  Nystatin 100000 Unit/gm Crea (Nystatin) 11)  Verapamil Hcl Powd (verapamil Hcl) Gel  .... Apply a small amount to anal fissure two to three times a  day for 2-3 weeks. 12)  Benzonatate 100 Mg Caps (Benzonatate) .... Take 1-2 by mouth three times a day as needed cough 13)  Promethazine Hcl 25 Mg Tabs (Promethazine hcl) .Marland Kitchen.. 1 by mouth q4-6 hours  for nausea and vomiting 14)  Fluocinonide 0.05 % Crea (Fluocinonide) .... Use two times a day to poison ivy not on face 15)  Prednisone 20 Mg Tabs (Prednisone) .... Take 3 by mouth  once daily x 3 day ,2 po qd x 3 day,1 by mouth once daily x 3d, 1/2 by mouth  for 3 days or as directed  Patient Instructions: 1)  take the prednisone    2)  call if not improving 3)  Knee exercises to help knee  Prescriptions: PREDNISONE 20 MG TABS (PREDNISONE) take 3 by mouth  once daily x 3 day ,2 po qd x 3 day,1 by mouth once daily x 3d, 1/2 by mouth  for 3 days or as directed  #30 x 0   Entered and Authorized by:   Madelin Headings MD   Signed by:   Madelin Headings MD on 03/10/2010   Method used:   Electronically to        [facility] [phone]* (retail)       [address]       [place], [place]  [address]       Ph: [phone] or [phone]       Fax: [phone]   RxID:   [id number]

## 2011-01-14 NOTE — Letter (Signed)
Summary: [facility]  [facility]   Imported By: Maryln Gottron 11/12/2010 13:24:31  _____________________________________________________________________  External Attachment:    Type:   Image     Comment:   External Document

## 2011-01-14 NOTE — Assessment & Plan Note (Signed)
Summary: tb reading//ccm  Nurse Visit   Allergies: 1)  ! Codeine 2)  Erythromycin Ethylsuccinate  PPD Results    Date of reading: 01/08/2011    Results: < 5mm    Interpretation: negative   Romualdo Bolk, CMA Duncan Dull)  January 08, 2011 4:07 PM

## 2011-01-14 NOTE — Progress Notes (Signed)
Summary: REQUEST FOR Rx CHANGE  Phone Note From Pharmacy   Caller: [facility]) Summary of Call: [facility] [place] called to adv that Cardizem is not available.... They have been changing most Rx for Cardizem to  Diltiazem  /  Diltiazem XR.... Need a return call from Dr Fabian Sharp or nurse adv that Rx can be switched ....  # [phone] (can speak to sarah or ben)...... Debbra Riding  November 20, 2010 3:37 PM  Initial call taken by: Debbra Riding,  November 20, 2010 3:39 PM  Follow-up for Phone Call        see previous phone note. Follow-up by: Romualdo Bolk, CMA Duncan Dull),  November 20, 2010 3:51 PM
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
## 2011-01-14 NOTE — Progress Notes (Signed)
Summary: Cardizem la out of stock  Phone Note From Pharmacy   Caller: [facility] Summary of Call: CArdizem LA 180mg  generic is currently out of stock and the only thing available is the brand name. Would you like to switch it to something or have them get the brand name? Initial call taken by: Romualdo Bolk, CMA Duncan Dull),  November 19, 2010 11:35 AM  Follow-up for Phone Call        what ever patient thinks will be better  she had a problem with the last generic .   does she want to try again or go with the brand? Follow-up by: Madelin Headings MD,  November 19, 2010 12:45 PM  Additional Follow-up for Phone Call Additional follow up Details #1::        Left message to call back. Additional Follow-up by: Romualdo Bolk, CMA Duncan Dull),  November 19, 2010 12:48 PM    Additional Follow-up for Phone Call Additional follow up Details #2::    pt cb Follow-up by: Heron Sabins,  November 19, 2010 1:24 PM  Additional Follow-up for Phone Call Additional follow up Details #3:: Details for Additional Follow-up Action Taken: Left message for pt to call back and let us know what she would like to do. Romualdo Bolk, CMA (AAMA)  November 20, 2010 8:55 AM    [facility] called to adv that Cardizem is not available.... They have been changing most Rx for Cardizem to  Diltiazem  /  Diltiazem XR.... Need a return call from Dr Fabian Sharp or nurse adv that Rx can be switched ....  # [phone]...Marland KitchenMarland KitchenMarland Kitchen Debbra Riding  November 20, 2010 3:37 PM  Spoke to pt and she has been taking diltiazem all along. Pt wants to go ahead and change it. [facility] and told them that it would be okay to do generic diltiazem xr 180. I called [facility] and told them to go ahead and do dilitazem xr 180.  Additional Follow-up by: Romualdo Bolk, CMA (AAMA),  November 20, 2010 3:54 PM

## 2011-01-14 NOTE — Progress Notes (Signed)
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 Now her eyes are itching, red and matted shut. Pt uses [facility] Initial call taken by: Romualdo Bolk, CMA Duncan Dull),  March 27, 2010 9:50 AM  Follow-up for Phone Call        Pt called back saying that she is not running a fever and doesn't wear contacts. Eyes are still red and itchy.  Follow-up by: Romualdo Bolk, CMA Duncan Dull),  March 27, 2010 1:47 PM  Additional Follow-up for Phone Call Additional follow up Details #1::        Per Dr. Fabian Sharp- Polytrim #1 1 gtt to affected eye qid Left message on machine that we sent rx into [facility] on [address].  Additional Follow-up by: Romualdo Bolk, CMA (AAMA),  March 27, 2010 5:22 PM    New/Updated Medications: POLYTRIM 10000-0.1 UNIT/ML-% SOLN (POLYMYXIN B-TRIMETHOPRIM) 1 gtt in affected qid Prescriptions: POLYTRIM 10000-0.1 UNIT/ML-% SOLN (POLYMYXIN B-TRIMETHOPRIM) 1 gtt in affected qid  #1 x 0   Entered by:   Romualdo Bolk, CMA (AAMA)   Authorized by:   Madelin Headings MD   Signed by:   Romualdo Bolk, CMA (AAMA) on 03/27/2010   Method used:   [facility]ronically to        [facility] [phone][facility] (retail)       [address]       [place], [place]  [address]       Ph: [phone]       Fax: [phone]   RxID:   [id number]

## 2011-01-14 NOTE — Progress Notes (Signed)
Summary: Pt  is having a severe problem with yeast per OBGYN-  Phone Note Call from Patient Call back at Home Phone [phone]   Caller: Patient Summary of Call: Pt called and went to OBGYN and has severe with yeast. Pt is req for [facility] to call her asap.  Initial call taken by: Lucy Antigua,  November 23, 2010 4:40 PM  Follow-up for Phone Call        Left message to call back. Follow-up by: Romualdo Bolk, CMA Duncan Dull),  November 24, 2010 8:39 AM  Additional Follow-up for Phone Call Additional follow up Details #1::        Spoke to pt- Pt is wanting to know if she needs a cpx or just a ov. Pt takes boric acid pills for yeast. Pt was covered from front to back with yeast. Pt is wanting to take metformin because they think her sugar is elevated. Pt wants to know if she just needs a cpx, ov or labs then a ov. Additional Follow-up by: Romualdo Bolk, CMA Duncan Dull),  November 24, 2010 4:26 PM    Additional Follow-up for Phone Call Additional follow up Details #2::    Per Dr. Fabian Sharp- get cpx labs, plus hga1c, then schedule cpx 1st week of jan. Also get records from gyn. 9:15 1st wed in Jan. Follow-up by: Romualdo Bolk, CMA Duncan Dull),  November 24, 2010 5:23 PM  Additional Follow-up for Phone Call Additional follow up Details #3:: Details for Additional Follow-up Action Taken: Pt aware and appts schedule. Pt to call gyn to get her records faxed. Additional Follow-up by: Romualdo Bolk, CMA (AAMA),  November 25, 2010 8:52 AM

## 2011-01-14 NOTE — Progress Notes (Signed)
Summary: diarrhea  Phone Note Call from Patient   Caller: Patient Summary of Call: Pt called saying that on Wed she had nausea, vomiting and diarrhea. Today it is mostly abd pain and diarrhea. Initial call taken by: Romualdo Bolk, CMA Duncan Dull),  January 28, 2010 8:13 AM  Follow-up for Phone Call        Per Dr. Gilda Crease in at 9:30am Pt aware. Follow-up by: Romualdo Bolk, CMA (AAMA),  January 28, 2010 8:19 AM

## 2011-01-14 NOTE — Assessment & Plan Note (Signed)
Summary: fever/runny nose/coughing/wheezing/cjr   Vital Signs:  Patient profile:   45 year old female Menstrual status:  irregular Temp:     98.7 degrees F oral BP sitting:   150 / 82  (left arm) Cuff size:   regular  Vitals Entered By: Sid Falcon LPN (May 25, 2010 2:01 PM) CC: Runny nose, cough   History of Present Illness: Onset last week nasal congestion, sneezing, and cough. ?low grade fever.  Body aches and fatigue.  Cough mostly dry. Some mild wheezing off and on. ?hx of asthma.  Pt takes Asmanex regularly-once daily.  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  Allergies: 1)  ! Codeine 2)  Erythromycin Ethylsuccinate  Past History:  Past Medical History: Last updated: 01/28/2010 Hyperlipidemia Hypertension PCO Cardiology consult for palpitations nl echo Dr Diona Browner Anal Fissure Arthritis hx of allergies    as a child on shots but not   asthma.    rosacea?  Review of Systems      See HPI  Physical Exam  General:  Well-developed,well-nourished,in no acute distress; alert,appropriate and cooperative throughout examination Ears:  External ear exam shows no significant lesions or deformities.  Otoscopic examination reveals clear canals, tympanic membranes are intact bilaterally without bulging, retraction, inflammation or discharge. Hearing is grossly normal bilaterally. Mouth:  Oral mucosa and oropharynx without lesions or exudates.  Teeth in good repair. Neck:  No deformities, masses, or tenderness noted. Lungs:  few faint wheezes.  No rales.  symmetric breath sounds. Heart:  Normal rate and regular rhythm. S1 and S2 normal without gallop, murmur, click, rub or other extra sounds.   Impression & Recommendations:  Problem # 1:  ASTHMATIC BRONCHITIS, ACUTE (ICD-466.0) suspect viral trigger.  No antibiotics at this time.  Refill Proair.  Increase Asmanex to two times a day (at least short term) and pt requesting refill  promethazine DM cough syrup. Her updated medication list for this problem includes:    Asmanex 120 Metered Doses 220 Mcg/inh Aepb (Mometasone furoate) .Marland Kitchen... 2 puffs two times a day    Proair Hfa 108 (90 Base) Mcg/act Aers (Albuterol sulfate) .Marland Kitchen... 2 puffs every 4 hours as needed    Benzonatate 100 Mg Caps (Benzonatate) .Marland Kitchen... Take 1-2 by mouth three times a day as needed cough    Promethazine-dm 6.25-15 Mg/77ml Syrp (Promethazine-dm) ..... One tsp every 4-6 hours as needed cough  Complete Medication List: 1)  Cardizem La 180 Mg Tb24 (Diltiazem hcl coated beads) .Marland Kitchen.. 1 by mouth once daily 2)  Zinc Oxide Cream 30 Grams One Tube  .... Apply to rectal area two times a day 3)  Miralax Powd (Polyethylene glycol 3350) .... Take one scoop mixed in 8 ounces of water in the morning and at bedtime. 4)  Benefiber Powd (Wheat dextrin) .... Take 1 scoop dissolved in water once daily 5)  Asmanex 120 Metered Doses 220 Mcg/inh Aepb (Mometasone furoate) .... 2 puffs two times a day 6)  Proair Hfa 108 (90 Base) Mcg/act Aers (Albuterol sulfate) .... 2 puffs every 4 hours as needed 7)  Prilosec Otc 20 Mg Tbec (Omeprazole magnesium) 8)  Boric Acid Powd (Boric acid) 9)  Mirena 20 Mcg/24hr Iud (Levonorgestrel) 10)  Nystatin 100000 Unit/gm Crea (Nystatin) 11)  Verapamil Hcl Powd (verapamil Hcl) Gel  .... Apply a small amount to anal fissure two to three times a day for 2-3 weeks. 12)  Benzonatate 100 Mg Caps (Benzonatate) .... Take 1-2 by mouth three times a day as  needed cough 13)  Promethazine Hcl 25 Mg Tabs (Promethazine hcl) .Marland Kitchen.. 1 by mouth q4-6 hours  for nausea and vomiting 14)  Fluocinonide 0.05 % Crea (Fluocinonide) .... Use two times a day to poison ivy not on face 15)  Prednisone 20 Mg Tabs (Prednisone) .... Take 3 by mouth  once daily x 3 day ,2 po qd x 3 day,1 by mouth once daily x 3d, 1/2 by mouth  for 3 days or as directed 16)  Polytrim 10000-0.1 Unit/ml-% Soln (Polymyxin b-trimethoprim) .Marland Kitchen.. 1 gtt in  affected qid 17)  Promethazine-dm 6.25-15 Mg/90ml Syrp (Promethazine-dm) .... One tsp every 4-6 hours as needed cough  Patient Instructions: 1)  Acute Bronchitis symptoms for less then 10 days are not  helped by antibiotics. Take over the counter cough medications. Call if no improvement in 5-7 days, sooner if increasing cough, fever, or new symptoms ( shortness of breath, chest pain) .  Prescriptions: PROMETHAZINE-DM 6.25-15 MG/5ML SYRP (PROMETHAZINE-DM) one tsp every 4-6 hours as needed cough  #120 x 0   Entered and Authorized by:   Evelena Peat MD   Signed by:   Evelena Peat MD on 05/25/2010   Method used:   Electronically to        [facility] [address] [phone]* (retail)       [address] [place], [place]  [address]       Ph: [phone]       Fax: [phone]   RxID:   [id number] PROAIR HFA 108 (90 BASE) MCG/ACT AERS (ALBUTEROL SULFATE) 2 puffs every 4 hours as needed  #1 x 2   Entered and Authorized by:   Evelena Peat MD   Signed by:   Evelena Peat MD on 05/25/2010   Method used:   Electronically to        [facility] [address] [phone]* (retail)       [address] [place], [place]  [address]       Ph: [phone]       Fax: [phone]   RxID:   [id number] ASMANEX 120 METERED DOSES 220 MCG/INH AEPB (MOMETASONE FUROATE) 2 puffs two times a day  #1 x 4   Entered and Authorized by:   Evelena Peat MD   Signed by:   Evelena Peat MD on 05/25/2010   Method used:   Electronically to        [facility] [address] [phone]* (retail)       [address] [place], [place]  [address]       Ph: [phone]       Fax: [phone]   RxID:   [id number]

## 2011-01-20 NOTE — Assessment & Plan Note (Signed)
Summary: CPX   Vital Signs:  Patient profile:   45 year old female Menstrual status:  IUD Height:      59.25 inches Weight:      181 pounds BMI:     36.38 Pulse rate:   83 / minute BP sitting:   120 / 80  (left arm) Cuff size:   regular  Vitals Entered By: Romualdo Bolk, CMA (AAMA) (January 05, 2011 3:41 PM)  Nutrition Counseling: Patient's BMI is greater than 25 and therefore counseled on weight management options. CC: CPX without pap- Pt has a gyn who does paps. LMP - Character: varies Menarche (age onset years): 14   Menses interval (days): 28 Menstrual flow (days): 5 Menstrual Status IUD Last PAP Result normal   History of Present Illness: Gail [name] comes in today  for  preventive visit and follow up of medical problems.  Bp: seem s to be doing ok .denies side effects of medication Asthma: Uing asmanex  1  or twice a day as needed.is better from her last respiratory exacerbation. Recurrent yeast:   Using boric acid  weekly to control the  yeast .   had antibiotic and pred then.   OBESITY:  still working hard on this her GYN and her had questions about possibility of [place] and to get her sugar checked because she had such a bad yeast infection   Preventive Care Screening  Pap Smear:    Date:  10/13/2010    Results:  normal   Prior Values:    Pap Smear:  normal (11/04/2009)    Mammogram:  normal (12/13/2008)    Colonoscopy:  Location:  [facility].   (05/01/2008)    Last Tetanus Booster:  Tdap (11/25/2009)   Preventive Screening-Counseling & Management  Alcohol-Tobacco     Alcohol drinks/day: <1     Alcohol type: wine     Smoking Status: quit     Year Quit: age 88  Caffeine-Diet-Exercise     Caffeine use/day: 0     Does Patient Exercise: no  Hep-HIV-STD-Contraception     Dental Visit-last 6 months yes     Sun Exposure-Excessive: no  Safety-Violence-Falls     Seat Belt Use: yes     Firearms in the Home: no firearms in  the home     Smoke Detectors: yes  Current Medications (verified): 1)  Cardizem La 180 Mg  Tb24 (Diltiazem Hcl Coated Beads) .Marland Kitchen.. 1 By Mouth Once Daily 2)  Zinc Oxide Cream 30 Grams One Tube .... Apply To Rectal Area Two Times A Day 3)  Miralax   Powd (Polyethylene Glycol 3350) .... Take One Scoop Mixed in 8 Ounces of Water in The Morning and At Bedtime. 4)  Asmanex 120 Metered Doses 220 Mcg/inh Aepb (Mometasone Furoate) .... 2 Puffs Two Times A Day 5)  Proair Hfa 108 (90 Base) Mcg/act Aers (Albuterol Sulfate) .... 2 Puffs Every 4 Hours As Needed 6)  Prilosec Otc 20 Mg Tbec (Omeprazole Magnesium) 7)  Boric Acid  Powd (Boric Acid) 8)  Mirena 20 Mcg/24hr Iud (Levonorgestrel) 9)  Nystatin 100000 Unit/gm Crea (Nystatin) 10)  Acidophilus  Caps (Lactobacillus)  Allergies (verified): 1)  ! Codeine 2)  Erythromycin Ethylsuccinate  Past History:  Past medical, surgical, family and social histories (including risk factors) reviewed, and no changes noted (except as noted below).  Past Medical History: Reviewed history from 01/28/2010 and no changes required. Hyperlipidemia Hypertension PCO Cardiology consult for palpitations nl echo Dr Clance Boll  Fissure Arthritis hx of allergies    as a child on shots but not   asthma.    rosacea?  Past Surgical History: Reviewed history from 06/04/2008 and no changes required. Carpal tunnel release x2 (bilateral) Wisdom teeth extraction Left knee arthroscopy Wrist tendon release bilateral thumbs  Past History:  Care Management: Gynecology: [facility]: [facility]   Marylene Buerger.  Family History: Reviewed history from 10/23/2008 and no changes required. Family History of CAD Female 1st degree relative  Family History Hypertension Family History Lung cancer uncle Family History of Breast Cancer: Great Aunt No FH of Colon Cancer     Social History: Reviewed history from 11/11/2010 and no changes  required. Occupation:early childcare worker    has a new Printmaker.  Married Former Smoker-stopped at age 27 Alcohol Use - yes 3 drinks in a month Caffeine Use 2-3 drinks a month Illicit Drug Use - no  Patient does not get regular exercise.    goin to school also for degree has new pet   Dental Care w/in 6 mos.:  yes Sun Exposure-Excessive:  no  Review of Systems  The patient denies anorexia, fever, weight loss, vision loss, decreased hearing, hoarseness, chest pain, syncope, peripheral edema, prolonged cough, headaches, abdominal pain, melena, hematochezia, severe indigestion/heartburn, hematuria, transient blindness, difficulty walking, unusual weight change, abnormal bleeding, and enlarged lymph nodes.         sob could be from condition   some cough  in cold. l eft knee    had recent AGE  from school getting better.   Physical Exam  General:  Well-developed,well-nourished,in no acute distress; alert,appropriate and cooperative throughout examination Head:  normocephalic and atraumatic.   Eyes:  PERRL, EOMs full, conjunctiva clear  Ears:  R ear normal, L ear normal, and no external deformities.   Nose:  no external deformity and no nasal discharge.   Mouth:  good dentition and pharynx pink and moist.   Neck:  No deformities, masses, or tenderness noted. Breasts:  No mass, nodules, thickening, tenderness, bulging, retraction, inflamation, nipple discharge or skin changes noted.   Lungs:  Normal respiratory effort, chest expands symmetrically. Lungs are clear to auscultation, no crackles or wheezes.no dullness.   Heart:  Normal rate and regular rhythm. S1 and S2 normal without gallop, murmur, click, rub or other extra sounds. Abdomen:  Bowel sounds positive,abdomen soft and non-tender without masses, organomegaly or hernias noted. Genitalia:  per gyne Msk:  no joint swelling, no redness over joints, and no joint deformities.   Pulses:  pulses intact without delay   Extremities:  no  clubbing cyanosis or edema  Neurologic:  alert & oriented X3, cranial nerves IIi-XII intact, strength normal in all extremities, gait normal, and DTRs symmetrical and normal.   Skin:  turgor normal, color normal, no ecchymoses, no petechiae, no purpura, and no ulcerations.   Cervical Nodes:  No lymphadenopathy noted Axillary Nodes:  No palpable lymphadenopathy Inguinal Nodes:  No significant adenopathy Psych:  Normal eye contact, appropriate affect. Cognition appears normal.    Impression & Recommendations:  Problem # 1:  PREVENTIVE HEALTH CARE (ICD-V70.0) Discussed nutrition,exercise,diet,healthy weight, vitamin D and calcium.     counseled   Problem # 2:  HYPERTENSION (ICD-401.9) Assessment: Unchanged  Her updated medication list for this problem includes:    Cardizem La 180 Mg Tb24 (Diltiazem hcl coated beads) .Marland Kitchen... 1 by mouth once daily  BP today: 120/80 Prior BP: 172/84 (11/11/2010)  Prior 10 Yr Risk Heart Disease: 6 % (  07/15/2009)  Labs Reviewed: K+: 3.9 (12/10/2010) Creat: : 0.8 (12/10/2010)   Chol: 205 (12/10/2010)   HDL: 40.40 (12/10/2010)   LDL: 136 (11/18/2008)   TG: 68.0 (12/10/2010)  Problem # 3:  HYPERGLYCEMIA, BORDERLINE (ICD-790.29) disc options  and limitations of data   but med is weight lnuetral   she states she sometimes gets 180 PP bg  Her updated medication list for this problem includes:    Metformin Hcl 500 Mg Xr24h-tab (Metformin hcl) .Marland Kitchen... 1 by mouth once daily  or as directed  Problem # 4:  ASTHMA (ICD-493.90) reviewed using  controller inhalers early if resp infections  The following medications were removed from the medication list:    Prednisone 20 Mg Tabs (Prednisone) .Marland Kitchen... Take  3 by mouth once daily  for 2 days then 2 by mouth once daily for 3 days or as directed Her updated medication list for this problem includes:    Asmanex 120 Metered Doses 220 Mcg/inh Aepb (Mometasone furoate) .Marland Kitchen... 2 puffs two times a day    Proair Hfa 108 (90 Base)  Mcg/act Aers (Albuterol sulfate) .Marland Kitchen... 2 puffs every 4 hours as needed  Problem # 5:  HYPERLIPIDEMIA (ICD-272.4) Assessment: Unchanged  Labs Reviewed: SGOT: 22 (12/10/2010)   SGPT: 22 (12/10/2010)  Lipid Goals: Chol Goal: 200 (11/20/2007)   HDL Goal: 40 (11/20/2007)   LDL Goal: 130 (11/20/2007)   TG Goal: 150 (11/20/2007)  Prior 10 Yr Risk Heart Disease: 6 % (07/15/2009)   HDL:40.40 (12/10/2010), 40.10 (11/18/2009)  LDL:136 (11/18/2008), DEL (10/18/2007)  Chol:205 (12/10/2010), 201 (11/18/2009)  Trig:68.0 (12/10/2010), 61.0 (11/18/2009)  Problem # 6:  OBESITY (ICD-278.00)  Complete Medication List: 1)  Cardizem La 180 Mg Tb24 (Diltiazem hcl coated beads) .Marland Kitchen.. 1 by mouth once daily 2)  Zinc Oxide Cream 30 Grams One Tube  .... Apply to rectal area two times a day 3)  Miralax Powd (Polyethylene glycol 3350) .... Take one scoop mixed in 8 ounces of water in the morning and at bedtime. 4)  Asmanex 120 Metered Doses 220 Mcg/inh Aepb (Mometasone furoate) .... 2 puffs two times a day 5)  Proair Hfa 108 (90 Base) Mcg/act Aers (Albuterol sulfate) .... 2 puffs every 4 hours as needed 6)  Prilosec Otc 20 Mg Tbec (Omeprazole magnesium) 7)  Boric Acid Powd (Boric acid) 8)  Mirena 20 Mcg/24hr Iud (Levonorgestrel) 9)  Nystatin 100000 Unit/gm Crea (Nystatin) 10)  Acidophilus Caps (Lactobacillus) 11)  Metformin Hcl 500 Mg Xr24h-tab (Metformin hcl) .Marland Kitchen.. 1 by mouth once daily  or as directed  Other Orders: TB Skin Test [id number])  Patient Instructions: 1)  on the asmanex  increase to two times a day  when get a  respiratory infection.  2)  If You sugars are elevated  2 hours post eating in the 150 -180 range   can consider taking the metformin once a day  3)  Avoiding  simple sugars will help as well as losing weight 4)  hg a1c  in 3-4  months and then  return office visit .   Prescriptions: METFORMIN HCL 500 MG XR24H-TAB (METFORMIN HCL) 1 by mouth once daily  or as directed  #30 x 4   Entered and  Authorized by:   Madelin Headings MD   Signed by:   Madelin Headings MD on 01/05/2011   Method used:   Print then Give to Patient   RxID:   [id number]    Orders Added: 1)  TB Skin Test [86580] 2)  Est. Patient 40-64 years [99396] 3)  Est. Patient Level II [99212]   Immunizations Administered:  PPD Skin Test:    Vaccine Type: PPD    Site: right forearm    Mfr: Sanofi Pasteur    Dose: 0.1 ml    Route: ID    Given by: Romualdo Bolk, CMA (AAMA)    Exp. Date: 08/13/2012    Lot #: [id number]   Immunizations Administered:  PPD Skin Test:    Vaccine Type: PPD    Site: right forearm    Mfr: Sanofi Pasteur    Dose: 0.1 ml    Route: ID    Given by: Romualdo Bolk, CMA (AAMA)    Exp. Date: 08/13/2012    Lot #: [id number]

## 2011-02-08 ENCOUNTER — Telehealth: Payer: Self-pay | Admitting: *Deleted

## 2011-02-08 DIAGNOSIS — R7303 Prediabetes: Secondary | ICD-10-CM

## 2011-02-08 NOTE — Telephone Encounter (Signed)
Order sent.

## 2011-02-27 LAB — POCT I-STAT, CHEM 8
Calcium, Ion: 1.12 mmol/L (ref 1.12–1.32)
Chloride: 105 mEq/L (ref 96–112)
Creatinine, Ser: 0.9 mg/dL (ref 0.4–1.2)

## 2011-03-04 ENCOUNTER — Encounter: Payer: BC Managed Care – PPO | Attending: Internal Medicine | Admitting: *Deleted

## 2011-03-04 DIAGNOSIS — Z713 Dietary counseling and surveillance: Secondary | ICD-10-CM | POA: Insufficient documentation

## 2011-03-04 DIAGNOSIS — R7309 Other abnormal glucose: Secondary | ICD-10-CM | POA: Insufficient documentation

## 2011-03-27 ENCOUNTER — Other Ambulatory Visit: Payer: Self-pay | Admitting: Internal Medicine

## 2011-04-01 ENCOUNTER — Ambulatory Visit (INDEPENDENT_AMBULATORY_CARE_PROVIDER_SITE_OTHER): Payer: BC Managed Care – PPO | Admitting: Internal Medicine

## 2011-04-01 ENCOUNTER — Encounter: Payer: Self-pay | Admitting: Internal Medicine

## 2011-04-01 VITALS — BP 120/80 | HR 76 | Temp 98.6°F | Wt 177.0 lb

## 2011-04-01 DIAGNOSIS — M549 Dorsalgia, unspecified: Secondary | ICD-10-CM | POA: Insufficient documentation

## 2011-04-01 DIAGNOSIS — I10 Essential (primary) hypertension: Secondary | ICD-10-CM

## 2011-04-01 DIAGNOSIS — M542 Cervicalgia: Secondary | ICD-10-CM

## 2011-04-01 MED ORDER — CYCLOBENZAPRINE HCL 10 MG PO TABS: 10.0000 mg | ORAL_TABLET | Freq: Three times a day (TID) | ORAL | Status: AC | PRN

## 2011-04-01 NOTE — Progress Notes (Signed)
  Subjective:    Patient ID: Gail [name], female    DOB: [birth year], 45 y.o.   MRN: [id number]  HPI Patient comes in for acute visit for above  Was having problem with neck and  Left upper arm pain and spasm and stiffness and now when walking yesterday  Began to have left lower back pain. Aggravated by lifting and bendingLeft  Left Arm and shoulder neck pain began about 3 weeks  Ago when she  Awoke    In the am with a crick in her neck and stiffness.  Usedadvil  And biofreeze some help but then  Got stiffer and now now pain down left arm  .  Marland Kitchen  After massage  Has muscle spasm.     No weakness .    Yesterday got a catch in back. Some help with advil.   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 Review of Systems Negative for chest pain shortness of breath new GI problems blood pressures been stable.    Objective:   Physical Exam Well-developed well-nourished in no acute distress but with obvious discomfort with sitting. Gait pretty normal. Neck: No masses negative midline tenderness very tight left trapezius range of motion of shoulder within normal limits but some discomfort with rotation internal. Tenderness left SI area back no midline tenderness. Extremities no obvious weakness. Neurologic:  Normal strength in extremities toe heel walk DTR symmetrical. No focal atrophy     Assessment & Plan:  Neck pain with left trapezius spasm and radiation to the left upper arm and he this could be all cervical unclear.. if the shoulders involved. No alarm features.     Acute left back spasm  Or  catch with remote  history of same, again no alarm symptoms.  Recommend relative rest normal activity no bending or lifting note written for work. I think it is safe to use anti-inflammatory  dosage for the next week or 2 monitor blood pressure and stay on Prilosec for GI protection. We will add  Flexeril to use as needed.  If she is not getting better we'll consider physical therapy. Some stretching exercises discussed.  Hypertension controlled  continue medication.

## 2011-04-01 NOTE — Patient Instructions (Signed)
No bending or lifting for 3  Days .  Antiinflammatory  meds ok  For now.  Take  Ibuprofen 600 - 800 mg every 8 - 12 hours  For a 7-14 days   Or  2 aleve 2 x per day.  Muscle relaxant  As needed.  If not getting better then consider Physical therapy .

## 2011-04-09 ENCOUNTER — Other Ambulatory Visit (INDEPENDENT_AMBULATORY_CARE_PROVIDER_SITE_OTHER): Payer: BC Managed Care – PPO | Admitting: Internal Medicine

## 2011-04-09 DIAGNOSIS — IMO0001 Reserved for inherently not codable concepts without codable children: Secondary | ICD-10-CM

## 2011-04-13 ENCOUNTER — Encounter: Payer: Self-pay | Admitting: *Deleted

## 2011-04-15 ENCOUNTER — Encounter: Payer: Self-pay | Admitting: Internal Medicine

## 2011-04-20 ENCOUNTER — Ambulatory Visit (INDEPENDENT_AMBULATORY_CARE_PROVIDER_SITE_OTHER): Payer: BC Managed Care – PPO | Admitting: Internal Medicine

## 2011-04-20 ENCOUNTER — Encounter: Payer: Self-pay | Admitting: Internal Medicine

## 2011-04-20 VITALS — BP 130/90 | HR 60 | Wt 177.0 lb

## 2011-04-20 DIAGNOSIS — R7309 Other abnormal glucose: Secondary | ICD-10-CM

## 2011-04-20 DIAGNOSIS — J45909 Unspecified asthma, uncomplicated: Secondary | ICD-10-CM

## 2011-04-20 DIAGNOSIS — M542 Cervicalgia: Secondary | ICD-10-CM

## 2011-04-20 MED ORDER — ALBUTEROL SULFATE HFA 108 (90 BASE) MCG/ACT IN AERS: 2.0000 | INHALATION_SPRAY | Freq: Four times a day (QID) | RESPIRATORY_TRACT | Status: DC | PRN

## 2011-04-20 MED ORDER — PREDNISONE 10 MG PO TABS: ORAL_TABLET | ORAL | Status: DC

## 2011-04-20 MED ORDER — HYDROCODONE-GUAIFENESIN 5-100 MG/5ML PO SYRP: ORAL_SOLUTION | ORAL | Status: DC

## 2011-04-20 NOTE — Assessment & Plan Note (Signed)
Discussed rationale of prednisone referral as needed.

## 2011-04-20 NOTE — Progress Notes (Signed)
  Subjective:    Patient ID: Gail [name], female    DOB: 1966/05/04, 45 y.o.   MRN: [id number]  HPI  Patient comes in today as followup of her couple medical problems. Regard to her neck and back pain:  Left  Neck pain  now down upper arm  Like pinching upper left back.   Back spasm some better . Is still very uncomfortable. 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 No weakness or numbness. Massage doesn't help. Hyperglycemia: I decided to not add the metformin andHas seen nutritionist  and getting some good advice for dietary changes. Now stopped sweet tea. Used splenda.    And stevia  Better with fatigue.  His changing the way she contacts. Had lab test done.  Hypertension: Hasn't checked her reading feels that it has been okay thinks pain may be related to her numbers. 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  Wants refill of her pro-air.  No fever.  Past Medical History  Diagnosis Date  . Hyperlipidemia   . Hypertension   . PCO (polycystic ovaries)   . Palpitation     cardiology consult nl echo  . Anal fissure   . Arthritis   . Allergy     History of shots as a child  . Rosacea   . History of infertility, female    Past Surgical History  Procedure Date  . Carpal tunnel release     x2 bilateral  . Wisdom tooth extraction   . Knee arthroscopy     left   . Wrist tendon release     bilateral thumbs    reports that she has quit smoking. She does not have any smokeless tobacco history on file. She reports that she drinks alcohol. She reports that she does not use illicit drugs. family history includes Breast cancer in an unspecified family member; Coronary artery disease in an unspecified family member; Hypertension in an unspecified family member; and Lung cancer in an unspecified family member. Allergies  Allergen  Reactions  . Codeine     REACTION: itching  . Erythromycin Ethylsuccinate     REACTION: unspecified   can take azithromycin       Objective:   Physical Exam Well-developed well-nourished no acute distress but is uncomfortable trying to get a good position sitting in a chair. Negative midline tenderness but tenderness throughout the left upper trapezius and upper scapular area and proximal arm. Good range of motion of arm. No rashes noted there  slight amount of poison ivy on the right arm  Review of labs hemoglobin A1c 6.1 Mood  Stable  Intact      Assessment & Plan:  Neck and arm pain  Sounds radicular  No weakness  Start prednisone taper  expectant management if not getting better we'll see her orthopedist Dr. Luiz Blare. Hyperglycemia   discussed at length interventions really sound appropriate hemoglobin A1c is stable.  Warned that her blood sugars could be going off on the prednisone temporarily.  Hypertension  uncontrolled diastolic slightly elevated today this may be related to her pain level  Asthma stable cough today     Total visit > 50% spent counseling and coordinating care

## 2011-04-20 NOTE — Patient Instructions (Signed)
Take prednisone to see if will help pinched nerve pain. If not getting better then we should have you see orthopedic specialist.  Check BP readings ocassional. Continue lifestyle intervention healthy eating and exercise .

## 2011-04-26 ENCOUNTER — Telehealth: Payer: Self-pay

## 2011-04-26 NOTE — Telephone Encounter (Signed)
Pt called and stated that she ran out of prednisone she was taking 6 pillsx 3 days, then 4 pills x 3 days; pt stated that medicaiton is helping and she would like the rest called in to [facility] [place]

## 2011-04-27 MED ORDER — PREDNISONE 10 MG PO TABS: ORAL_TABLET | ORAL | Status: DC

## 2011-04-27 NOTE — Telephone Encounter (Signed)
Rx sent to pharmacy   

## 2011-04-30 NOTE — Assessment & Plan Note (Signed)
[facility]                            CARDIOLOGY OFFICE NOTE   TOPANGA, ALVELO                      MRN:          [id number]  DATE:11/11/2006                            DOB:          [birth year]    REFERRING PHYSICIAN:  Silas Sacramento, M.D.   PRIMARY CARE PHYSICIAN:  Neta Mends. Fabian Sharp, MD   REQUESTING PHYSICIAN:  Silas Sacramento, M.D. at [facility].   REASON FOR CONSULTATION:  Palpitations.   HISTORY OF PRESENT ILLNESS:  Ms. Oestreicher is a pleasant, 45 year old woman  with an apparent longstanding history of hypertension, as well as  polycystic ovary disease.  She apparently, in the past, was evaluated by  an endocrinologist without any other major findings suggestive of  secondary hypertension.  She has been on methyldopa long-term.  She  describes to me, at least a 2-3 year history of intermittent  palpitations, not associated with syncope.  She does feel somewhat  breathless with these events which are sometimes very brief, although at  other times more prolonged with a feeling of rapid heart rate.  She  feels somewhat anxious with these events; and, at times, has chest  discomfort.  There are several electrocardiograms available for review,  including the recent one done yesterday; and a previous tracing from  January of this year.  Each shows sinus tachycardia although the most  recent tracing from the 29th shows some inferolateral ST segment  depression less than 1 mm and with nonspecific T wave abnormalities.  These changes are less noted today although with continued sinus  tachycardia at 109 beats/minute.  It is difficult to ascertain whether  these are sudden in onset or not, based on her description.  She has no  known history of coronary artery disease, type 2 diabetes mellitus, or  family history of premature coronary artery disease.  She did have a  resting echocardiogram done in 2004 secondary to palpitations and  this  revealed normal left ventricular systolic function with no major  valvular abnormalities.  Recently in the last few weeks, she has had  more intense symptoms as well as an increase in her blood pressure.   ALLERGIES:  Erythromycin and Vicodin.   PRESENT MEDICATIONS INCLUDE:  1. Methyl dopa 250 mg p.o. b.i.d.  2. Recently added Cardizem LA 120 mg p.o. daily.  3. She takes Advil and Tylenol p.r.n.   PAST MEDICAL HISTORY:  As outlined in the history of present illness.  She reports a history of carpal tunnel surgery in 1992, prior knee  arthroscopy when she was in high school.   SOCIAL HISTORY:  The patient is married.  She has no children.  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  She  denies any active tobacco use.  She quit when she was 60 or 45 years  old.  She denies any recent recreational drug use.  She drinks perhaps 1  or 2 alcoholic beverages a month.  She has been trying to do some  exercise on a stationery bicycle and treadmill recently and  been trying  to watch her carbohydrate intake with a goal of weight loss.   REVIEW OF SYSTEMS:  As described in the history of present illness. She  wonders about some potential anxiety, although does not seem to be  particularly bothered by this on a regular basis.  She has menstrual  dysfunction, some fatigue and seasonal allergies.  She has been seeing a  chiropractor for back pain.   FAMILY HISTORY:  Significant for congestive heart failure in her  paternal grandmother who died in her late 49's.   PHYSICAL EXAMINATION:  Blood pressure initially 173/111, rechecked by me  at 166/100.  Heart rate 109 down to 100.  Weight is 174 pounds.  Overweight woman in no acute distress, without active chest pain.  HEENT:  Conjunctiva is normal.  Pharynx is clear.  NECK:  Supple without elevated jugular venous pressure, without bruits.  No thyromegaly is noted.  LUNGS:  Clear without labored breathing at  rest.  CARDIAC EXAM:  Reveals a regular rate and rhythm, without S3 gallop,  pericardial rub, or loud systolic murmur.  ABDOMEN:  Soft, nontender, no bruits are evident.  Bowel sounds are  present.  EXTREMITIES:  Show no significant pitting edema.  Distal pulses are 2+.  SKIN:  Warm and dry .  MUSCULOSKELETAL:  No kyphosis is noted.  NEUROPSYCHIATRIC:  The patient is alert and oriented x3.   RECENT LABORATORY DATA:  WBC is 6.6, hemoglobin 14.9, platelets 198,  previous TSH normal at 2.29 in January of this year LDL cholesterol of  174 at that time.   IMPRESSION/RECOMMENDATIONS:  1. A fairly longstanding history of intermittent palpitations, some      breathlessness, and intermittent chest pain.  The symptoms have      been worse recently and also associated with hypertension, although      this has also been a longstanding problem.  Her electrocardiogram      has fairly consistently shown sinus tachycardia with some ST-T wave      changes and no other specific dysrhythmias have been uncovered.      She has not had frank syncope, or any clearly exertional      precipitation of these symptoms.  I discussed a number of issues      with her today, and we will begin with a 43 year old Holter      monitor.  She has been describing daily symptoms recently.  We will      also precede with an echocardiogram with exercise echocardiogram to      assess for any potential exercise induced arrhythmias or potential      ischemia although this is less likely.  I will also arrange a 24-      hour urine collection for metanephrines and normetanephrines; and      then have her followup in the office over the      next few weeks to discuss the results.  2. Further plans to follow.     Jonelle Sidle, MD  Electronically Signed    SGM/MedQ  DD: 11/11/2006  DT: 11/12/2006  Job #: [id number]   cc:   Silas Sacramento, M.D.  Neta Mends. Fabian Sharp, MD

## 2011-04-30 NOTE — Op Note (Signed)
NAMEJESSAH, DANSER               ACCOUNT NO.:  [id number]   MEDICAL RECORD NO.:  [id number]          PATIENT TYPE:  AMB   LOCATION:  DSC                          FACILITY:  [facility]   PHYSICIAN:  Lowell Bouton, M.D.DATE OF BIRTH:  04-07-1966   DATE OF PROCEDURE:  05/12/2005  DATE OF DISCHARGE:                                 OPERATIVE REPORT   PREOPERATIVE DIAGNOSIS:  Lollie Sails stenosing tenosynovitis, left wrist.   POSTOPERATIVE DIAGNOSIS:  Lollie Sails stenosing tenosynovitis, left wrist.   PROCEDURE:  Release of first dorsal compartment, left wrist.   SURGEON:  Lowell Bouton, M.D.   ANESTHESIA:  Half percent Marcaine local with sedation.   OPERATIVE FINDINGS:  The patient had one sheath that contained multiple  slips of the APL and the EPB tendon. There was inflammation surrounding the  tendons.   PROCEDURE:  Under 1/2% Marcaine local anesthesia with a tourniquet on the  left arm, the left hand was prepped and draped in usual fashion.  After  exsanguinating the limb, the tourniquet was inflated to 250 mmHg. A  transverse incision was made over the radial styloid.  Sharp dissection was  carried through the subcutaneous tissues. Blunt dissection was carried to  the tendon sheath and the sheath was incised sharply. The scissors then used  to release the compartment completely around the tendons. Three tendons  slips were identified, one being the EPB and two the APL. The wound was then  irrigated copiously with saline and the skin was closed with a 3-0  subcuticular Prolene. Steri-Strips were applied. Half percent Marcaine had  been inserted for pain control. The patient was placed in the sterile  dressings and a thumb spica splint. She tolerated the procedure well and  went to recovery room awake and stable in good condition.      EMM/MEDQ  D:  05/12/2005  T:  05/12/2005  Job:  [id number]

## 2011-04-30 NOTE — Assessment & Plan Note (Signed)
[facility]                                 ON-CALL NOTE   TIEA, MANNINEN                        MRN:          [id number]  DATE:09/30/2007                            DOB:          1966/04/23    PRIMARY CARE PHYSICIAN:  Neta Mends. Panosh, M.D.   Phone number is [phone].   The patient is on multiple medications and wants to know what over-the-  counter medications to take for a cold.  I advised Tylenol, lots of  liquids.     Jeffrey A. Tawanna Cooler, MD  Electronically Signed    JAT/MedQ  DD: 09/30/2007  DT: 10/01/2007  Job #: [id number]

## 2011-04-30 NOTE — Assessment & Plan Note (Signed)
[facility]                            CARDIOLOGY OFFICE NOTE   RUVI, FULLENWIDER                      MRN:          [id number]  DATE:11/24/2006                            DOB:          07-04-1966    PRIMARY CARE PHYSICIAN:  Neta Mends. Panosh, MD   REASON FOR VISIT:  Followup cardiac testing.   HISTORY OF PRESENT ILLNESS:  I saw Ms. Gail [name] back in late November. She  was referred at that time with a history of palpitations, hypertension,  and some intermittent chest pain. We referred her for followup testing  including a resting echocardiogram which demonstrated normal left  ventricular systolic function at 55% to 65%, without regional wall  motion abnormalities. She underwent an exercise echocardiogram, which  showed abnormal ST segment changes (likely false-positive), although no  associated inducible wall motion abnormalities by echocardiography and  no stress-induced arrhythmias. A 24-hour urinalysis for metanephrines  and normetanephrines was also within normal limits. She comes into the  office today stating that generally she feels better having been started  on Cardizem. Her blood pressure is actually normal today. She is not  having any major problems with chest pain and states that her  palpitations are much improved. I reviewed her results today and she was  reassured. At this particular point, I would focus on blood pressure  control and observation. She planned to followup with Dr. Fabian Sharp.   ALLERGIES:  ERYTHROMYCIN AND VICODIN.   PRESENT MEDICATIONS:  1. Methyldopa 250 mg p.o. b.i.d.  2. Cardizem LA 120 mg p.o. nightly.   REVIEW OF SYSTEMS:  As described in History of Present Illness.   PHYSICAL EXAMINATION:  Blood pressure is 128/70, heart rate is 60 and  regular, weight is 172 pounds. The patient is comfortable and in no  acute distress. Examination is otherwise unchanged.   IMPRESSIONS AND RECOMMENDATIONS:  1. History of  palpitations. The 48-hour Holter monitoring demonstrated      occasional premature ventricular complexes without any sustained      arrhythmias and I suspect that the patient is symptomatic due to      this. She has normal left ventricular systolic function and no      inducible wall motion abnormalities with stress to suggest      significant ischemia. I suspect that her electrocardiographic      changes were a false-positive finding in the absence of      echocardiographic abnormalities. No evidence of pheochromocytoma      based on 24-hour urine collection. At this point, I would recommend      continued follow up with blood pressure with Dr. Fabian Sharp and      medication titration as needed. We can certainly reevaluate her if      symptoms progress significantly.  2. Cardiology follow up as needed.     Jonelle Sidle, MD  Electronically Signed    SGM/MedQ  DD: 11/24/2006  DT: 11/24/2006  Job #: [id number]   cc:   Neta Mends. Fabian Sharp, MD

## 2011-05-14 ENCOUNTER — Telehealth: Payer: Self-pay | Admitting: *Deleted

## 2011-05-14 DIAGNOSIS — M542 Cervicalgia: Secondary | ICD-10-CM

## 2011-05-14 DIAGNOSIS — M549 Dorsalgia, unspecified: Secondary | ICD-10-CM

## 2011-05-14 NOTE — Telephone Encounter (Signed)
Ok to do referral?  

## 2011-05-14 NOTE — Telephone Encounter (Signed)
Left message on machine for pt about this and order sent to [facility].

## 2011-05-14 NOTE — Telephone Encounter (Signed)
Spoke to pt and as soon as she got off the prednisone the pain comes back. She would like a referral to Katie at [facility] for Physical Therapy.

## 2011-05-25 ENCOUNTER — Other Ambulatory Visit: Payer: Self-pay | Admitting: Obstetrics and Gynecology

## 2011-05-25 DIAGNOSIS — Z1231 Encounter for screening mammogram for malignant neoplasm of breast: Secondary | ICD-10-CM

## 2011-06-01 ENCOUNTER — Ambulatory Visit
Admission: RE | Admit: 2011-06-01 | Discharge: 2011-06-01 | Disposition: A | Discharge status: Home / Self Care | Payer: BC Managed Care – PPO | Source: Ambulatory Visit | Attending: Obstetrics and Gynecology | Admitting: Obstetrics and Gynecology

## 2011-06-01 DIAGNOSIS — Z1231 Encounter for screening mammogram for malignant neoplasm of breast: Secondary | ICD-10-CM

## 2011-07-04 ENCOUNTER — Other Ambulatory Visit: Payer: Self-pay | Admitting: Internal Medicine

## 2011-07-06 ENCOUNTER — Ambulatory Visit (INDEPENDENT_AMBULATORY_CARE_PROVIDER_SITE_OTHER): Payer: BC Managed Care – PPO | Admitting: Internal Medicine

## 2011-07-06 ENCOUNTER — Encounter: Payer: Self-pay | Admitting: Internal Medicine

## 2011-07-06 ENCOUNTER — Encounter: Payer: Self-pay | Admitting: Cardiology

## 2011-07-06 VITALS — BP 170/96 | HR 114 | Temp 98.6°F | Wt 172.0 lb

## 2011-07-06 DIAGNOSIS — R002 Palpitations: Secondary | ICD-10-CM

## 2011-07-06 DIAGNOSIS — I10 Essential (primary) hypertension: Secondary | ICD-10-CM

## 2011-07-06 LAB — BASIC METABOLIC PANEL
BUN: 14 mg/dL (ref 6–23)
CO2: 29 mEq/L (ref 19–32)
Calcium: 9.4 mg/dL (ref 8.4–10.5)
Creatinine, Ser: 0.8 mg/dL (ref 0.4–1.2)
Glucose, Bld: 114 mg/dL — ABNORMAL HIGH (ref 70–99)

## 2011-07-06 LAB — T4, FREE: Free T4: 0.93 ng/dL (ref 0.60–1.60)

## 2011-07-06 LAB — MAGNESIUM: Magnesium: 2.8 mg/dL — ABNORMAL HIGH (ref 1.5–2.5)

## 2011-07-06 MED ORDER — DILTIAZEM HCL ER 240 MG PO CP24: 240.0000 mg | ORAL_CAPSULE | Freq: Every day | ORAL | Status: DC

## 2011-07-06 NOTE — Progress Notes (Signed)
Subjective:    Patient ID: Gail [name], female    DOB: [birth year], 45 y.o.   MRN: [id number]  HPI Patient  comes in as a walk in this a.m. because of concerns about above. Since her last visit she had been doing fairly well. However a 3  days ago  she went out to eat and had 2 glasses of tea with caffeine that she usually doesn't have. She began having frequent palpitations and difficulty sleeping that night. She was very busy during the weekend and had no chest pain shortness of breath and maybe a minor cough. Since that time she is having increasing frequency of palpitations and irregular heartbeat and becoming very scared and feeling bad with this. Unsure she is having racing heart. She has no syncope fever wheezing bleeding upper respiratory changes. Her blood pressure had been controlled on her diltiazem. She hasn't missed very many doses. Is difficult for her to work yesterday she comes in today for evaluation.  He has a remote history about 5 years ago but evaluation for palpitations that weren't as bad had an echocardiogram and a stress test unsure she had another monitor.  Review of Systems Negative for fever vision hearing change no recent use of inhaler. No syncope but did feel weak when she bent over and stood up. No unusual headache vision changes hearing changes. Past history family history social history reviewed in the electronic medical record.      Objective:   Physical Exam Physical Exam: Vital signs reviewed BJY:NWGN is a well-developed well-nourished alert cooperative  white female who appears her stated age in no acute distress. She appears a bit tired and somewhat anxious. HEENT: normocephalic  traumatic , Eyes: PERRL EOM's full, conjunctiva clear, Nares: paten,t no deformity discharge or tenderness., Ears: no deformity EAC's clear TMs with normal landmarks. Mouth: clear OP, no lesions, edema.  Moist mucous membranes. Dentition in adequate repair. NECK: supple without  masses, thyromegaly or bruits. CHEST/PULM:  Clear to auscultation and percussion breath sounds equal no wheeze , rales or rhonchi. No chest wall deformities or tenderness. CV: PMI is nondisplaced, S1 S2 no gallops, murmurs, rubs. Peripheral pulses are full without delay.No JVD .  EKG shows sinus rhythm at 100 regular rate with normal intervals. However on exam she had very frequent premature beats versus irregular beats that lasted at least 30 seconds. This was not captured on her EKG. She states these were not as bad at its worst.  Repeat blood pressure lying  130/82  Large cuff  right ABDOMEN: Bowel sounds normal nontender  No guard or rebound, no hepato splenomegal no CVA tenderness.  No hernia. Extremtities:  No clubbing cyanosis or edema, no acute joint swelling or redness no focal atrophy NEURO:  Oriented x3, cranial nerves 3-12 appear to be intact, no obvious focal weakness,gait within normal limits no abnormal reflexes or asymmetrical SKIN: No acute rashes normal turgor, color, no bruising or petechiae. PSYCH: Oriented, good eye contact, no obvious depression cognition and judgment appear normal. Mildly anxious. EKG SR no acute changes rate 100       Assessment & Plan:  Sudden onset and increased severity of palpitations.  I am assuming that these are very frequent premature beats versus other arrhythmia. Would assume that her caffeine effect would be worn off. Her blood pressure is very high today however comes down with rest.  Rule out metabolic triggers.  She feels badly when these events occur although there is some secondary anxiety. These  are lasting over the last 3-4 days.  Hypertension had been controlled.   Will increase her diltiazem now to try to help both issues no work today and tomorrow.  Have cardiology see her again about the advisability of either a repeat echo or other monitor.

## 2011-07-06 NOTE — Patient Instructions (Signed)
Increase the diltiazem  To 240 mg per day Will notify you  of labs when available.  then cardiology  .  Evaluation. No caffiene .

## 2011-07-07 ENCOUNTER — Ambulatory Visit (HOSPITAL_COMMUNITY): Payer: BC Managed Care – PPO | Attending: Cardiology | Admitting: Radiology

## 2011-07-07 ENCOUNTER — Ambulatory Visit (INDEPENDENT_AMBULATORY_CARE_PROVIDER_SITE_OTHER): Payer: BC Managed Care – PPO | Admitting: Cardiology

## 2011-07-07 ENCOUNTER — Encounter: Payer: Self-pay | Admitting: Cardiology

## 2011-07-07 DIAGNOSIS — I10 Essential (primary) hypertension: Secondary | ICD-10-CM

## 2011-07-07 DIAGNOSIS — R002 Palpitations: Secondary | ICD-10-CM | POA: Insufficient documentation

## 2011-07-07 DIAGNOSIS — E785 Hyperlipidemia, unspecified: Secondary | ICD-10-CM | POA: Insufficient documentation

## 2011-07-07 MED ORDER — METOPROLOL SUCCINATE ER 50 MG PO TB24: 50.0000 mg | ORAL_TABLET | Freq: Every day | ORAL | Status: DC

## 2011-07-07 NOTE — Assessment & Plan Note (Addendum)
Patient complains of recurrent palpitations. Previous stress test and 24-hour Holter unremarkable other than PVCs. She did have symptoms while I was examining her. Her symptoms correlate with what sounds to be premature beats. Recent TSH and electrolytes normal. Repeat echocardiogram. Discontinue Cardizem and add Toprol 50 mg daily for symptomatic relief. Patient instructed to avoid caffeine.

## 2011-07-07 NOTE — Patient Instructions (Signed)
Your physician recommends that you schedule a follow-up appointment in: 8 WEEKS  Your physician has requested that you have an echocardiogram. Echocardiography is a painless test that uses sound waves to create images of your heart. It provides your doctor with information about the size and shape of your heart and how well your heart's chambers and valves are working. This procedure takes approximately one hour. There are no restrictions for this procedure.   STOP DILTIAZEM  START METOPROLOL SUCC 50MG  ONCE DAILY

## 2011-07-07 NOTE — Progress Notes (Signed)
HPI: 45 year old female for evaluation of palpitations. TSH, magnesium and potassium normal in July of 2012. Stress echocardiogram in 2007 showed no ischemia and echocardiogram in 2007 showed normal LV function. Holter monitor showed occasional PVC. Laboratories for pheochromocytoma were negative. Patient presents today with complaints of increased palpitations. She has had these intermittently for years. She drank 2 caffeinated beverages last week and her palpitations increased. They are described as a butterfly sensation. They are not sustained. There are no associated symptoms. She also has some dyspnea on exertion but no orthopnea, PND, pedal edema or exertional chest pain. Because of her symptoms we were asked to further evaluate.  Current Outpatient Prescriptions  Medication Sig Dispense Refill  . albuterol (PROAIR HFA) 108 (90 BASE) MCG/ACT inhaler Inhale 2 puffs into the lungs every 6 (six) hours as needed.  1 Inhaler  3  . Boric Acid POWD by Does not apply route.        . cyclobenzaprine (FLEXERIL) 10 MG tablet Take 1 tablet (10 mg total) by mouth every 8 (eight) hours as needed for muscle spasms.  30 tablet  1  . hydrocodone-guaifenesin (HYCOTUSS) 5-100 MG/5ML syrup Take 1-2 tsp every 4-6 hours as needed for cough  180 mL  0  . Lactobacillus (ACIDOPHILUS) 10 MG CAPS Take by mouth.        . levonorgestrel (MIRENA) 20 MCG/24HR IUD 1 each by Intrauterine route once.        . mometasone (ASMANEX) 220 MCG/INH inhaler Inhale 2 puffs into the lungs daily.        Marland Kitchen nystatin (MYCOSTATIN) cream Apply topically 2 (two) times daily.        Marland Kitchen omeprazole (PRILOSEC OTC) 20 MG tablet Take 20 mg by mouth daily.        . Zinc Oxide 30.6 % CREA Apply topically.        . metoprolol (TOPROL XL) 50 MG 24 hr tablet Take 1 tablet (50 mg total) by mouth daily.  30 tablet  11    Allergies  Allergen Reactions  . Codeine     REACTION: itching  . Erythromycin Ethylsuccinate     REACTION: unspecified   can take  azithromycin    Past Medical History  Diagnosis Date  . Hyperlipidemia   . Hypertension   . PCO (polycystic ovaries)   . Anal fissure   . Arthritis   . Allergy     History of shots as a child  . Rosacea   . History of infertility, female   . IBS (irritable bowel syndrome)   . Asthma     Reflux induced    Past Surgical History  Procedure Date  . Carpal tunnel release     x2 bilateral  . Wisdom tooth extraction   . Knee arthroscopy     left   . Wrist tendon release     bilateral thumbs    History   Social History  . Marital Status: Married    Spouse Name: N/A    Number of Children: 0  . Years of Education: N/A   Occupational History  . INFANT TEACHER    Social History Main Topics  . Smoking status: Former Smoker    Quit date: 12/13/1986  . Smokeless tobacco: Not on file  . Alcohol Use: Yes     socially  . Drug Use: No  . Sexually Active: Not on file   Other Topics Concern  . 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    Family History  Problem Relation Age of Onset  . Hypertension    . Lung cancer      uncle  . Breast cancer      great aunt    ROS: no fevers or chills, productive cough, hemoptysis, dysphasia, odynophagia, melena, hematochezia, dysuria, hematuria, rash, seizure activity, orthopnea, PND, pedal edema, claudication. Remaining systems are negative.  Physical Exam: General:  Well developed/well nourished in NAD Skin warm/dry Patient not depressed No peripheral clubbing Back-normal HEENT-normal/normal eyelids Neck supple/normal carotid upstroke bilaterally; no bruits; no JVD; no thyromegaly chest - CTA/ normal expansion CV - RRR/normal S1 and S2; no rubs or gallops;  PMI nondisplaced; 1/6 systolic ejection murmur Abdomen -NT/ND, no HSM, no mass, + bowel sounds, no bruit 2+  femoral pulses, no bruits Ext-no edema, chords, 2+ DP Neuro-grossly nonfocal  ECG sinus rhythm at a rate of 101. Occasional PAC. Axis normal. No ST changes.

## 2011-07-07 NOTE — Assessment & Plan Note (Signed)
Management per primary care. 

## 2011-07-07 NOTE — Assessment & Plan Note (Signed)
We are changing Cardizem to Toprol both for blood pressure and palpitations. Follow blood pressure and increase Toprol if needed.

## 2011-07-08 ENCOUNTER — Telehealth: Payer: Self-pay | Admitting: Cardiology

## 2011-07-08 NOTE — Telephone Encounter (Signed)
PER PT AWARE OF ECHO RESULTS HAD ANOTHER QUESTION CONT TO C/O PALPITATIONS TODAY  WANTED TO KNOW HOW LONG  IT WOULD TAKE FOR  MEDS TO HELP INSTRUCTED PT TO GIVE A FEW MORE DAYS IF NO IMPROVEMENT IN PALPITATIONS OR B/P TO CALL OFF. VERBALIZED UNDERSTANDING.

## 2011-07-08 NOTE — Telephone Encounter (Signed)
Returning call back to Debra  

## 2011-07-12 ENCOUNTER — Other Ambulatory Visit: Payer: Self-pay | Admitting: Internal Medicine

## 2011-07-12 NOTE — Telephone Encounter (Signed)
Pt has seen the cardiologist. Make sure it's not a long term cough. Okay to refill x 1 per Dr. Fabian Sharp.

## 2011-07-12 NOTE — Progress Notes (Signed)
Left message on machine about results. 

## 2011-07-12 NOTE — Telephone Encounter (Signed)
Spoke to pt and she had it some last week. She hasn't had a cold with it. But it's mainly at night. She doesn't think it's from the medication. She is now on Toprol from the cardiologist.

## 2011-07-14 ENCOUNTER — Encounter: Payer: Self-pay | Admitting: *Deleted

## 2011-07-21 ENCOUNTER — Ambulatory Visit (INDEPENDENT_AMBULATORY_CARE_PROVIDER_SITE_OTHER): Payer: BC Managed Care – PPO | Admitting: Family Medicine

## 2011-07-21 ENCOUNTER — Encounter: Payer: Self-pay | Admitting: Family Medicine

## 2011-07-21 VITALS — BP 142/88 | Temp 99.2°F | Resp 12 | Wt 177.0 lb

## 2011-07-21 DIAGNOSIS — R062 Wheezing: Secondary | ICD-10-CM

## 2011-07-21 MED ORDER — PREDNISONE 10 MG PO TABS: ORAL_TABLET | ORAL | Status: AC

## 2011-07-21 NOTE — Patient Instructions (Signed)
Follow up promptly for any fever or worsening symptoms 

## 2011-07-21 NOTE — Progress Notes (Signed)
  Subjective:    Patient ID: Gail [name], female    DOB: 1966/07/08, 45 y.o.   MRN: [id number]  HPI Patient seen with cough for almost 2 weeks duration. Nonsmoker. No fever. Cough mostly dry. Was recently placed on Toprol but has had some prior history of reactive airway problems and generally well-controlled with Asmanex. She takes Asmanex 220 mcg 1 puff daily and also uses albuterol inhaler as needed. Minimal nasal congestion. Denies any significant dyspnea.   Review of Systems  Constitutional: Negative for fever and chills.  HENT: Negative for sore throat and sinus pressure.   Respiratory: Positive for cough and wheezing. Negative for shortness of breath.   Cardiovascular: Negative for chest pain.       Objective:   Physical Exam  Constitutional: She appears well-developed and well-nourished.  HENT:  Right Ear: External ear normal.  Left Ear: External ear normal.  Mouth/Throat: Oropharynx is clear and moist.  Neck: Neck supple.  Cardiovascular: Normal rate and regular rhythm.   Pulmonary/Chest:       No retractions. No tachypnea. Mild diffuse wheezes. No rales  Lymphadenopathy:    She has no cervical adenopathy.          Assessment & Plan:  Wheezing. Probably triggered by viral URI. Brief prednisone taper. Continue albuterol as needed. Increase Asmanex to 2 puffs daily during flareups. If more frequent flareups may need to discuss with cardiologist beta blocker use

## 2011-08-12 ENCOUNTER — Telehealth: Payer: Self-pay | Admitting: Cardiology

## 2011-08-12 NOTE — Telephone Encounter (Signed)
Pt is having SOB and problems she thinks it regarding her prednisone

## 2011-08-12 NOTE — Telephone Encounter (Signed)
Spoke with pt, she has been taking toprol and she has developed a cough and wheezing. Her medical doctor put her a prednisone dose pack and the symptoms have not really changed. She cont with a congested cough and wheezing. Medical doctor told her it could be the toprol. She will take 1/2 tablet to see if that changes her symptoms. Will forward for dr Jens Som review Gail [name]

## 2011-08-13 ENCOUNTER — Telehealth: Payer: Self-pay | Admitting: *Deleted

## 2011-08-13 NOTE — Telephone Encounter (Signed)
Try half dose toprol. If symptoms persist, dc toprol and resume cardizem at previous dose Gail [name]

## 2011-08-13 NOTE — Telephone Encounter (Signed)
could be but more likely triggered by rti  And ?  ragweek season.

## 2011-08-13 NOTE — Telephone Encounter (Signed)
Pt left a voicemail saying that she saw Dr. Caryl Never on 07/20/11 and was given prednisone. She is still wheezing and has started coughing again. Pt is wanting to know if this could be acid reflux. She has spoke with the cards about her medication and they told her to Try half dose toprol. If symptoms persist, dc toprol and resume cardizem at previous dose. She is wanting to know if this could be gerd causing this?

## 2011-08-13 NOTE — Telephone Encounter (Signed)
Pt aware of this. 

## 2011-08-26 NOTE — Telephone Encounter (Signed)
Left message for pt to call to discuss symptoms Gail [name]

## 2011-08-27 ENCOUNTER — Telehealth: Payer: Self-pay | Admitting: *Deleted

## 2011-08-27 NOTE — Telephone Encounter (Signed)
Pt calling stating 1/2 dose toprol is still causing some wheezing, coughing--advised to d/c toprol and i will send message to debra and dr Jens Som to call pt with a new med to try--pt agrees--nt

## 2011-08-27 NOTE — Telephone Encounter (Signed)
She can dc toprol and resume cardizem but palpitations will most likely be worse Gail [name]

## 2011-08-27 NOTE — Telephone Encounter (Signed)
Patient was told to call back , medication is not working.

## 2011-08-30 NOTE — Telephone Encounter (Signed)
Pt returning call. Please call back.

## 2011-08-30 NOTE — Telephone Encounter (Signed)
Pt aware and appt confirmation given Gail [name]

## 2011-08-30 NOTE — Telephone Encounter (Signed)
Spoke with pt, she wants to know if there is another beta blocker to try. She has a follow up on Friday this week. Will forward for dr Jens Som review Gail [name]

## 2011-08-30 NOTE — Telephone Encounter (Signed)
Will discuss at office appt. Gail [name]

## 2011-09-03 ENCOUNTER — Ambulatory Visit (INDEPENDENT_AMBULATORY_CARE_PROVIDER_SITE_OTHER): Payer: BC Managed Care – PPO | Admitting: Cardiology

## 2011-09-03 ENCOUNTER — Encounter: Payer: Self-pay | Admitting: Cardiology

## 2011-09-03 DIAGNOSIS — E785 Hyperlipidemia, unspecified: Secondary | ICD-10-CM

## 2011-09-03 DIAGNOSIS — I10 Essential (primary) hypertension: Secondary | ICD-10-CM

## 2011-09-03 MED ORDER — DILTIAZEM HCL ER COATED BEADS 240 MG PO CP24: 240.0000 mg | ORAL_CAPSULE | Freq: Every day | ORAL | Status: DC

## 2011-09-03 NOTE — Assessment & Plan Note (Signed)
Blood pressure mildly elevated. We are changing Toprol to Cardizem 240 mg daily. Monitor blood pressure and adjust medications as needed.

## 2011-09-03 NOTE — Patient Instructions (Signed)
Your physician recommends that you schedule a follow-up appointment in: 3 months  STOP METOPROLOL  START DILTIAZEM 240 MG ONCE DAILY

## 2011-09-03 NOTE — Progress Notes (Signed)
HPI: Pleasant female I have seen for evaluation of palpitations. TSH, magnesium and potassium normal in July of 2012. Stress echocardiogram in 2007 showed no ischemia and echocardiogram in 2007 showed normal LV function. Holter monitor showed occasional PVC. Laboratories for pheochromocytoma were negative. Repeat echo in July 2012 showed normal LV function. We added toprol but she has not tolerated. She has noticed increased wheezing and dyspnea since Toprol was initiated. She did reduce the dose from 50-25 mg but her symptoms did not improve. There is no chest pain. Her palpitations did improve with the Toprol.   Current Outpatient Prescriptions  Medication Sig Dispense Refill  . albuterol (PROAIR HFA) 108 (90 BASE) MCG/ACT inhaler Inhale 2 puffs into the lungs every 6 (six) hours as needed.  1 Inhaler  3  . Boric Acid POWD by Does not apply route as needed.       . cyclobenzaprine (FLEXERIL) 10 MG tablet Take 1 tablet (10 mg total) by mouth every 8 (eight) hours as needed for muscle spasms.  30 tablet  1  . hydrocodone-guaifenesin (HYCOTUSS) 5-100 MG/5ML syrup Take 1-2 tsp every 4-6 hours as needed for cough  180 mL  0  . HYDROMET 5-1.5 MG/5ML syrup TAKE 1 TO 2 TEASPOONSFUL BY MOUTH EVERY 4 TO 6 HOURS AS NEEDED FOR COUGH  180 mL  0  . Lactobacillus (ACIDOPHILUS) 10 MG CAPS Take by mouth daily.       Marland Kitchen levonorgestrel (MIRENA) 20 MCG/24HR IUD 1 each by Intrauterine route once.        . metoprolol (TOPROL-XL) 50 MG 24 hr tablet Take 25 mg by mouth daily.        . mometasone (ASMANEX) 220 MCG/INH inhaler Inhale 1 puff into the lungs daily.       Marland Kitchen nystatin (MYCOSTATIN) cream Apply topically 2 (two) times daily.        Marland Kitchen omeprazole (PRILOSEC OTC) 20 MG tablet Take 20 mg by mouth daily.        . Zinc Oxide 30.6 % CREA Apply topically as needed.          Past Medical History  Diagnosis Date  . Hyperlipidemia   . Hypertension   . PCO (polycystic ovaries)   . Anal fissure   . Arthritis   .  Allergy     History of shots as a child  . Rosacea   . History of infertility, female   . IBS (irritable bowel syndrome)   . Asthma     Reflux induced    Past Surgical History  Procedure Date  . Carpal tunnel release     x2 bilateral  . Wisdom tooth extraction   . Knee arthroscopy     left   . Wrist tendon release     bilateral thumbs    History   Social History  . Marital Status: Married    Spouse Name: N/A    Number of Children: 0  . Years of Education: N/A   Occupational History  . INFANT TEACHER    Social History Main Topics  . Smoking status: Former Smoker    Quit date: 12/13/1986  . Smokeless tobacco: Not on file  . Alcohol Use: Yes     socially  . Drug Use: No  . Sexually Active: Not on file   Other Topics Concern  . 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    ROS: no fevers or chills, productive cough, hemoptysis, dysphasia, odynophagia, melena, hematochezia, dysuria, hematuria, rash, seizure activity, orthopnea, PND, pedal edema, claudication. Remaining systems are negative.  Physical Exam: Well-developed well-nourished in no acute distress.  Skin is warm and dry.  HEENT is normal.  Neck is supple. No thyromegaly.  Chest is clear to auscultation with normal expansion.  Cardiovascular exam is diffuse mild expiratory wheeze increased with forced expiration. Abdominal exam nontender or distended. No masses palpated. Extremities show no edema. neuro grossly intact

## 2011-09-03 NOTE — Assessment & Plan Note (Signed)
Management per primary care. 

## 2011-09-03 NOTE — Assessment & Plan Note (Signed)
Felt secondary to PVCs. She did not tolerate Toprol as she had significant worsening and asthma. That has not improved despite being treated with steroids. Discontinue Toprol. Resume Cardizem 240 mg daily. She will reduce her caffeine intake which she feels exacerbated her PVCs previously.

## 2011-09-21 ENCOUNTER — Telehealth: Payer: Self-pay | Admitting: Cardiology

## 2011-09-21 NOTE — Telephone Encounter (Signed)
Pt was wondering how long it takes the cough to stop and the medication to get out of her system. She is still horse. She also was watching a TV show and they discribed her symptoms and she wants to talk to you.

## 2011-09-21 NOTE — Telephone Encounter (Signed)
Spoke with pt, she cont to have a cough and is still having hoarseness. She is wondering if maybe silent reflux. She is going to see a throat specialist. She wanted to know if we thought was a good idea. Told pt thought it was good idea esp since she is already on meds for reflux. She will let us know what she finds out [employer]

## 2011-09-24 ENCOUNTER — Telehealth: Payer: Self-pay | Admitting: *Deleted

## 2011-09-24 NOTE — Telephone Encounter (Signed)
It could be but Consider seeing pulmonary( Unsure if she has seen them before.  ) for recurrent wheezing .  Can do OV if needed in the meantime.  And try bid prilosec if not already  doing this

## 2011-09-24 NOTE — Telephone Encounter (Signed)
Pt called saying that she is still coughing and wheezing even after seeing cards and changing her medications. She is wondering if it could be her acid reflux medication is not working right.

## 2011-09-24 NOTE — Telephone Encounter (Signed)
Spoke to pt and she wants to see Dr. Fabian Sharp first before seeing pulmonary. Appt made.

## 2011-09-24 NOTE — Telephone Encounter (Signed)
Left message on machine to call back and let us know what pt wants to do.

## 2011-09-27 LAB — POCT PREGNANCY, URINE
Operator id: 114931
Preg Test, Ur: NEGATIVE

## 2011-09-27 LAB — CBC
Hemoglobin: 14.2
Platelets: 238
RDW: 12.6

## 2011-10-01 ENCOUNTER — Ambulatory Visit: Payer: BC Managed Care – PPO | Admitting: Internal Medicine

## 2011-10-18 ENCOUNTER — Other Ambulatory Visit: Payer: Self-pay | Admitting: Family Medicine

## 2011-10-19 ENCOUNTER — Telehealth: Payer: Self-pay

## 2011-10-19 MED ORDER — POLYETHYLENE GLYCOL 3350 17 GM/SCOOP PO POWD: 17.0000 g | Freq: Every day | ORAL | Status: AC

## 2011-10-19 MED ORDER — MOMETASONE FUROATE 220 MCG/INH IN AEPB: 2.0000 | INHALATION_SPRAY | Freq: Two times a day (BID) | RESPIRATORY_TRACT | Status: DC

## 2011-10-19 NOTE — Telephone Encounter (Signed)
Last seen (dr. Caryl Never- wheezing) 8/8 , last seen by you 7/24 (heart palpitations) , this is on med list but I dont see that it has been rx'd by you in this system.  Please advise

## 2011-10-19 NOTE — Telephone Encounter (Signed)
Pt would like asmanex refilled as well as Mirlax as well.  Ok per Dr. Fabian Sharp to call in rx.

## 2011-10-21 NOTE — Telephone Encounter (Signed)
i cant tell what medicine is being requested  To be refilled  From this document.   I oked  Her asthma  asmanex and miralax med i think . Is this the request? Thanks

## 2011-10-29 ENCOUNTER — Ambulatory Visit: Payer: BC Managed Care – PPO | Admitting: Gastroenterology

## 2011-11-09 ENCOUNTER — Telehealth: Payer: Self-pay | Admitting: Family Medicine

## 2011-11-09 NOTE — Telephone Encounter (Signed)
Dr Panosh pt 

## 2011-11-12 ENCOUNTER — Emergency Department (HOSPITAL_BASED_OUTPATIENT_CLINIC_OR_DEPARTMENT_OTHER)
Admission: EM | Admit: 2011-11-12 | Discharge: 2011-11-12 | Disposition: A | Discharge status: Home / Self Care | Payer: BC Managed Care – PPO | Attending: Emergency Medicine | Admitting: Emergency Medicine

## 2011-11-12 ENCOUNTER — Telehealth: Payer: Self-pay | Admitting: *Deleted

## 2011-11-12 ENCOUNTER — Encounter (HOSPITAL_BASED_OUTPATIENT_CLINIC_OR_DEPARTMENT_OTHER): Payer: Self-pay | Admitting: *Deleted

## 2011-11-12 DIAGNOSIS — K589 Irritable bowel syndrome without diarrhea: Secondary | ICD-10-CM | POA: Insufficient documentation

## 2011-11-12 DIAGNOSIS — E785 Hyperlipidemia, unspecified: Secondary | ICD-10-CM | POA: Insufficient documentation

## 2011-11-12 DIAGNOSIS — R112 Nausea with vomiting, unspecified: Secondary | ICD-10-CM | POA: Insufficient documentation

## 2011-11-12 DIAGNOSIS — N390 Urinary tract infection, site not specified: Secondary | ICD-10-CM

## 2011-11-12 DIAGNOSIS — I10 Essential (primary) hypertension: Secondary | ICD-10-CM | POA: Insufficient documentation

## 2011-11-12 DIAGNOSIS — J45909 Unspecified asthma, uncomplicated: Secondary | ICD-10-CM | POA: Insufficient documentation

## 2011-11-12 LAB — COMPREHENSIVE METABOLIC PANEL
ALT: 17 U/L (ref 0–35)
Alkaline Phosphatase: 90 U/L (ref 39–117)
CO2: 25 mEq/L (ref 19–32)
GFR calc Af Amer: >90 mL/min (ref >90)
GFR calc non Af Amer: >90 mL/min (ref >90)
Glucose, Bld: 101 mg/dL — ABNORMAL HIGH (ref 70–99)
Potassium: 3.9 mEq/L (ref 3.5–5.1)
Sodium: 137 mEq/L (ref 135–145)

## 2011-11-12 LAB — URINALYSIS, ROUTINE W REFLEX MICROSCOPIC
Bilirubin Urine: NEGATIVE
Ketones, ur: 40 mg/dL — AB
Nitrite: NEGATIVE
Urobilinogen, UA: 1 mg/dL (ref 0.0–1.0)

## 2011-11-12 LAB — CBC
Hemoglobin: 15.3 g/dL — ABNORMAL HIGH (ref 12.0–15.0)
RBC: 4.92 MIL/uL (ref 3.87–5.11)

## 2011-11-12 LAB — URINE MICROSCOPIC-ADD ON

## 2011-11-12 MED ORDER — ONDANSETRON HCL 4 MG PO TABS
4.0000 mg | ORAL_TABLET | Freq: Four times a day (QID) | ORAL | Status: AC
Start: 2011-11-12 — End: 2011-11-19

## 2011-11-12 MED ORDER — DEXTROSE 5 % IV SOLN
1.0000 g | Freq: Once | INTRAVENOUS | Status: AC
  Administered 2011-11-12: 1 g via INTRAVENOUS
  Filled 2011-11-12: qty 10

## 2011-11-12 MED ORDER — SODIUM CHLORIDE 0.9 % IV BOLUS (SEPSIS)
1000.0000 mL | Freq: Once | INTRAVENOUS | Status: AC
  Administered 2011-11-12: 1000 mL via INTRAVENOUS

## 2011-11-12 MED ORDER — ONDANSETRON HCL 4 MG/2ML IJ SOLN
4.0000 mg | Freq: Once | INTRAMUSCULAR | Status: AC
  Administered 2011-11-12: 4 mg via INTRAVENOUS
  Filled 2011-11-12: qty 2

## 2011-11-12 NOTE — Telephone Encounter (Signed)
Pt left a voicemail that I couldn't understand something about vomiting possibly. I called pt back but had to leave a voicemail to call back.

## 2011-11-12 NOTE — ED Provider Notes (Addendum)
History     CSN: [id number] Arrival date & time: 11/12/2011  5:38 PM   First MD Initiated Contact with Patient 11/12/11 1805      Chief Complaint  Patient presents with  . Emesis   patient began vomiting. This morning at 10:30. Diffuse intermittent nonbilious emesis. However, she states that the emesis turned yellow. This afternoon. She is only had 2 episodes of diarrhea. Patient had no fevers. She only has intermittent, diffuse abdominal pain, no focal pain. 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 She's had no back pain. Questionable dysuria. No recent travel.  (Consider location/radiation/quality/duration/timing/severity/associated sxs/prior treatment) HPI  Past Medical History  Diagnosis Date  . Hyperlipidemia   . Hypertension   . PCO (polycystic ovaries)   . Anal fissure   . Arthritis   . Allergy     History of shots as a child  . Rosacea   . History of infertility, female   . IBS (irritable bowel syndrome)   . Asthma     Reflux induced    Past Surgical History  Procedure Date  . Carpal tunnel release     x2 bilateral  . Wisdom tooth extraction   . Knee arthroscopy     left   . Wrist tendon release     bilateral thumbs    Family History  Problem Relation Age of Onset  . Hypertension    . Lung cancer      uncle  . Breast cancer      great aunt    History  Substance Use Topics  . Smoking status: Former Smoker    Quit date: 12/13/1986  . Smokeless tobacco: Not on file  . Alcohol Use: Yes     socially    OB History    Grav Para Term Preterm Abortions TAB SAB Ect Mult Living                  Review of Systems  All other systems reviewed and are negative.    Allergies  Codeine; Erythromycin ethylsuccinate; and Latex  Home Medications   Current Outpatient Rx  Name Route Sig Dispense Refill  . DILTIAZEM HCL ER COATED BEADS 240 MG PO CP24 Oral Take 1 capsule (240 mg total) by mouth daily. 30  capsule 12  . HYDROCORTISONE SOD SUCCINATE IJ Injection Inject 1 each as directed once.      . MOMETASONE FUROATE 220 MCG/INH IN AEPB Inhalation Inhale 1 puff into the lungs daily.      . NYSTATIN 100000 UNIT/GM EX CREA Topical Apply topically 2 (two) times daily.      Marland Kitchen OMEPRAZOLE MAGNESIUM 20 MG PO TBEC Oral Take 20 mg by mouth daily.      Marland Kitchen POLYETHYLENE GLYCOL 3350 PO POWD Oral Take 17 g by mouth daily.      . ACIDOPHILUS PEARLS PO CAPS Oral Take 1 capsule by mouth daily.      Marland Kitchen ZINC OXIDE 30.6 % EX CREA Apply externally Apply topically 2 (two) times daily.     . ALBUTEROL SULFATE HFA 108 (90 BASE) MCG/ACT IN AERS       . BORIC ACID POWD Does not apply by Does not apply route as needed.     . CYCLOBENZAPRINE HCL 10 MG PO TABS Oral Take 1 tablet (10 mg total) by mouth every 8 (eight) hours as needed for muscle spasms. 30 tablet 1  . HYDROCODONE-GUAIFENESIN 5-100 MG/5ML PO SYRP  Take 1-2 tsp every 4-6 hours as needed for cough 180 mL 0  . HYDROMET 5-1.5 MG/5ML PO SYRP  TAKE 1 TO 2 TEASPOONSFUL BY MOUTH EVERY 4 TO 6 HOURS AS NEEDED FOR COUGH 180 mL 0  . ACIDOPHILUS 10 MG PO CAPS Oral Take by mouth daily.     Marland Kitchen LEVONORGESTREL 20 MCG/24HR IU IUD Intrauterine 1 each by Intrauterine route once.        BP 175/94  Pulse 110  Temp 98.3 F (36.8 C)  Resp 18  Ht 5' (1.524 m)  Wt 175 lb (79.379 kg)  BMI 34.18 kg/m2  SpO2 100%  Physical Exam  Constitutional: She is oriented to person, place, and time. She appears well-developed and well-nourished.  HENT:  Head: Normocephalic and atraumatic.  Eyes: Conjunctivae and EOM are normal. Pupils are equal, round, and reactive to light.  Neck: Neck supple.  Cardiovascular: Normal rate and regular rhythm.  Exam reveals no gallop and no friction rub.   No murmur heard. Pulmonary/Chest: Breath sounds normal. She has no wheezes. She has no rales. She exhibits no tenderness.  Abdominal: Soft. Bowel sounds are normal. She exhibits no distension. There is no  rebound and no guarding.       Bowel sounds are normal, minimal diffuse tenderness, no focal tenderness, no rebound, rigidity or guarding  Musculoskeletal: Normal range of motion.  Neurological: She is alert and oriented to person, place, and time. No cranial nerve deficit. Coordination normal.  Skin: Skin is warm and dry. No rash noted.  Psychiatric: She has a normal mood and affect.    ED Course  Procedures (including critical care time)  Labs Reviewed  CBC - Abnormal; Notable for the following:    WBC 11.5 (*)    Hemoglobin 15.3 (*)    All other components within normal limits  COMPREHENSIVE METABOLIC PANEL - Abnormal; Notable for the following:    Glucose, Bld 101 (*)    All other components within normal limits  URINALYSIS, ROUTINE W REFLEX MICROSCOPIC - Abnormal; Notable for the following:    Ketones, ur 40 (*)    Leukocytes, UA TRACE (*)    All other components within normal limits  URINE MICROSCOPIC-ADD ON - Abnormal; Notable for the following:    Squamous Epithelial / LPF FEW (*)    All other components within normal limits   No results found.   1. UTI (lower urinary tract infection)   2. Nausea and vomiting       MDM  Pt is seen and examined;  Initial history and physical completed.  Will follow.   Results for orders placed during the hospital encounter of 11/12/11  CBC      Component Value Range   WBC 11.5 (*) 4.0 - 10.5 (K/uL)   RBC 4.92  3.87 - 5.11 (MIL/uL)   Hemoglobin 15.3 (*) 12.0 - 15.0 (g/dL)   HCT 16.1  09.6 - 04.5 (%)   MCV 91.9  78.0 - 100.0 (fL)   MCH 31.1  26.0 - 34.0 (pg)   MCHC 33.8  30.0 - 36.0 (g/dL)   RDW 40.9  81.1 - 91.4 (%)   Platelets 207  150 - 400 (K/uL)  COMPREHENSIVE METABOLIC PANEL      Component Value Range   Sodium 137  135 - 145 (mEq/L)   Potassium 3.9  3.5 - 5.1 (mEq/L)   Chloride 101  96 - 112 (mEq/L)   CO2 25  19 - 32 (mEq/L)   Glucose, Bld  101 (*) 70 - 99 (mg/dL)   BUN 17  6 - 23 (mg/dL)   Creatinine, Ser 7.82   0.50 - 1.10 (mg/dL)   Calcium 9.0  8.4 - 95.6 (mg/dL)   Total Protein 7.2  6.0 - 8.3 (g/dL)   Albumin 4.0  3.5 - 5.2 (g/dL)   AST 17  0 - 37 (U/L)   ALT 17  0 - 35 (U/L)   Alkaline Phosphatase 90  39 - 117 (U/L)   Total Bilirubin 0.6  0.3 - 1.2 (mg/dL)   GFR calc non Af Amer >90  >90 (mL/min)   GFR calc Af Amer >90  >90 (mL/min)  URINALYSIS, ROUTINE W REFLEX MICROSCOPIC      Component Value Range   Color, Urine YELLOW  YELLOW    APPearance CLEAR  CLEAR    Specific Gravity, Urine 1.025  1.005 - 1.030    pH 6.0  5.0 - 8.0    Glucose, UA NEGATIVE  NEGATIVE (mg/dL)   Hgb urine dipstick NEGATIVE  NEGATIVE    Bilirubin Urine NEGATIVE  NEGATIVE    Ketones, ur 40 (*) NEGATIVE (mg/dL)   Protein, ur NEGATIVE  NEGATIVE (mg/dL)   Urobilinogen, UA 1.0  0.0 - 1.0 (mg/dL)   Nitrite NEGATIVE  NEGATIVE    Leukocytes, UA TRACE (*) NEGATIVE   URINE MICROSCOPIC-ADD ON      Component Value Range   Squamous Epithelial / LPF FEW (*) RARE    WBC, UA 3-6  <3 (WBC/hpf)   Bacteria, UA RARE  RARE    No results found.    8:13 PM Patient feels much better, wants to go home. IV is out. She is given prescription for Zofran and instructions. She is discharged home in stable improved condition       Larayne Baxley A. Patrica Duel, MD 11/12/11 2013  Theron Arista A. Patrica Duel, MD 11/12/11 2013

## 2011-11-12 NOTE — Telephone Encounter (Signed)
Pt is having nausea and vomiting every 2 hours. Pt is not keeping anything down. No diarrhea. No normal urination. Low grade temp. No coughing or congestion.    Per Dr. Fabian Sharp- Pt needs to go to [facility] on [address] for possible dehydration. Pt aware of this.

## 2011-11-12 NOTE — ED Notes (Signed)
Vomiting onset last night still cant keep anything down feels like she may be dehydrated

## 2011-11-15 ENCOUNTER — Telehealth: Payer: Self-pay | Admitting: *Deleted

## 2011-11-15 NOTE — Telephone Encounter (Signed)
Pt called and went to the ED. She is still having symptoms of a UTI. They only have her zofran. Left message to call back

## 2011-11-16 NOTE — Telephone Encounter (Signed)
Spoke to pt- still burns occ.when she urinates. She has no lower back pain or no fever. Advised to monitor her symptoms and call back if she starts to have abd. Pain, fever or lower back pain then we can work her in.

## 2011-11-17 NOTE — Telephone Encounter (Signed)
Ok please see if you can find Urine culture result. Not said it was ordered but done see in in [facility]

## 2011-11-18 NOTE — Telephone Encounter (Signed)
Nothing found in EHR for urine culture.

## 2011-11-19 ENCOUNTER — Ambulatory Visit: Payer: BC Managed Care – PPO | Admitting: Cardiology

## 2011-12-23 ENCOUNTER — Ambulatory Visit (INDEPENDENT_AMBULATORY_CARE_PROVIDER_SITE_OTHER): Payer: BC Managed Care – PPO | Admitting: Family

## 2011-12-23 ENCOUNTER — Encounter: Payer: Self-pay | Admitting: Family

## 2011-12-23 ENCOUNTER — Ambulatory Visit (INDEPENDENT_AMBULATORY_CARE_PROVIDER_SITE_OTHER)
Admission: RE | Admit: 2011-12-23 | Discharge: 2011-12-23 | Disposition: A | Discharge status: Home / Self Care | Payer: BC Managed Care – PPO | Source: Ambulatory Visit | Attending: Family | Admitting: Family

## 2011-12-23 ENCOUNTER — Telehealth: Payer: Self-pay | Admitting: Internal Medicine

## 2011-12-23 VITALS — Temp 98.4°F | Wt 169.0 lb

## 2011-12-23 DIAGNOSIS — R300 Dysuria: Secondary | ICD-10-CM

## 2011-12-23 DIAGNOSIS — M545 Low back pain, unspecified: Secondary | ICD-10-CM

## 2011-12-23 LAB — POCT URINALYSIS DIPSTICK
Bilirubin, UA: NEGATIVE
Blood, UA: NEGATIVE
Glucose, UA: NEGATIVE
Nitrite, UA: NEGATIVE
Spec Grav, UA: >=1.03

## 2011-12-23 MED ORDER — DICLOFENAC SODIUM 75 MG PO TBEC: 75.0000 mg | DELAYED_RELEASE_TABLET | Freq: Two times a day (BID) | ORAL | Status: DC

## 2011-12-23 MED ORDER — KETOROLAC TROMETHAMINE 60 MG/2ML IM SOLN
60.0000 mg | Freq: Once | INTRAMUSCULAR | Status: AC
  Administered 2011-12-23: 60 mg via INTRAMUSCULAR

## 2011-12-23 NOTE — Telephone Encounter (Signed)
Pt called req to get back xray results. Pls call asap.

## 2011-12-23 NOTE — Progress Notes (Signed)
Subjective:    Patient ID: Gail [name], female    DOB: 1966-10-15, 46 y.o.   MRN: [id number]  HPI 46 year old white female, in with complains of low back pain been going on for about a week and a half. She originally saw Dr. Fabian Sharp prescribed a muscle relaxer for her. She's also been taken Advil and Tylenol as needed. She reports some muscle relaxers make her drowsy and the Tylenol and Advil is not helping. The pain is worse with movement, and worse when she was sitting for long periods. She has had one episode of dysuria that occurred yesterday after not being able to urinate all day. She denies any current frequency, urgency, blood in the urine or foul-smelling urine.   Review of Systems  Constitutional: Negative.   HENT: Negative.   Respiratory: Negative.   Cardiovascular: Negative.   Genitourinary: Positive for dysuria.       One episode yesterday.  Musculoskeletal: Positive for back pain.  Neurological: Negative.   Hematological: Negative.   Psychiatric/Behavioral: Negative.    Past Medical History  Diagnosis Date  . Hyperlipidemia   . Hypertension   . PCO (polycystic ovaries)   . Anal fissure   . Arthritis   . Allergy     History of shots as a child  . Rosacea   . History of infertility, female   . IBS (irritable bowel syndrome)   . Asthma     Reflux induced    History   Social History  . Marital Status: Married    Spouse Name: N/A    Number of Children: 0  . Years of Education: N/A   Occupational History  . INFANT TEACHER    Social History Main Topics  . Smoking status: Former Smoker    Quit date: 12/13/1986  . Smokeless tobacco: Not on file  . Alcohol Use: Yes     socially  . Drug Use: No  . Sexually Active: Not on file   Other Topics Concern  . 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     Past Surgical History  Procedure Date  . Carpal tunnel release     x2 bilateral  . Wisdom tooth extraction   . Knee arthroscopy     left   . Wrist tendon release     bilateral thumbs    Family History  Problem Relation Age of Onset  . Hypertension    . Lung cancer      uncle  . Breast cancer      great aunt    Allergies  Allergen Reactions  . Codeine Itching  . Erythromycin Ethylsuccinate     Stomach hurts REACTION: unspecified   can take azithromycin  . Latex Rash    Current Outpatient Prescriptions on File Prior to Visit  Medication Sig Dispense Refill  . albuterol (VENTOLIN HFA) 108 (90 BASE) MCG/ACT inhaler        . Boric Acid POWD by Does not apply route as needed.       . cyclobenzaprine (FLEXERIL) 10 MG tablet Take 1 tablet (10 mg total) by mouth every 8 (eight) hours as needed for muscle spasms.  30 tablet  1  . diltiazem (CARDIZEM CD) 240 MG 24 hr capsule Take 1 capsule (240 mg total) by mouth daily.  30 capsule  12  . hydrocodone-guaifenesin (HYCOTUSS) 5-100 MG/5ML syrup Take 1-2 tsp every 4-6 hours as needed for cough  180 mL  0  . HYDROCORTISONE SOD SUCCINATE IJ Inject 1 each as directed once.        Marland Kitchen HYDROMET 5-1.5 MG/5ML syrup TAKE 1 TO 2 TEASPOONSFUL BY MOUTH EVERY 4 TO 6 HOURS AS NEEDED FOR COUGH  180 mL  0  . Lactobacillus (ACIDOPHILUS) 10 MG CAPS Take by mouth daily.       Marland Kitchen levonorgestrel (MIRENA) 20 MCG/24HR IUD 1 each by Intrauterine route once.        . mometasone (ASMANEX) 220 MCG/INH inhaler Inhale 1 puff into the lungs daily.        Marland Kitchen nystatin (MYCOSTATIN) cream Apply topically 2 (two) times daily.        Marland Kitchen omeprazole (PRILOSEC OTC) 20 MG tablet Take 20 mg by mouth daily.        . polyethylene glycol powder (GLYCOLAX/MIRALAX) powder Take 17 g by mouth daily.        . Probiotic Product (ACIDOPHILUS PEARLS) CAPS Take 1 capsule by mouth daily.        . Zinc Oxide 30.6 % CREA Apply topically 2 (two) times daily.        No current  facility-administered medications on file prior to visit.    Temp(Src) 98.4 F (36.9 C) (Oral)  Wt 169 lb (76.658 kg)chart    Objective:   Physical Exam  Constitutional: She is oriented to person, place, and time. She appears well-developed and well-nourished.  Neck: Normal range of motion. Neck supple.  Cardiovascular: Normal rate and regular rhythm.   Pulmonary/Chest: Effort normal and breath sounds normal.  Abdominal: Soft. Bowel sounds are normal.  Musculoskeletal: Normal range of motion.  Neurological: She is alert and oriented to person, place, and time.  Skin: Skin is warm and dry.  Psychiatric: She has a normal mood and affect.          Assessment & Plan:  Assessment: Low back pain, dysuria  Plan: Since patient's urine was clear I do not believe the source of her back pain is from her bladder. The source of her back pain appears to be more musculoskeletal. X-ray of the L-spine. Continue Flexeril. Add diclofenac 75 mg twice a day with food. Ice and heat to the affected area. All the symptoms worsen or persist. Recheck her schedule, and when necessary.

## 2011-12-23 NOTE — Patient Instructions (Signed)
Back Pain, Adult Low back pain is very common. About 1 in 5 people have back pain.The cause of low back pain is rarely dangerous. The pain often gets better over time.About half of people with a sudden onset of back pain feel better in just 2 weeks. About 8 in 10 people feel better by 6 weeks.  CAUSES Some common causes of back pain include:  Strain of the muscles or ligaments supporting the spine.   Wear and tear (degeneration) of the spinal discs.   Arthritis.   Direct injury to the back.  DIAGNOSIS Most of the time, the direct cause of low back pain is not known.However, back pain can be treated effectively even when the exact cause of the pain is unknown.Answering your caregiver's questions about your overall health and symptoms is one of the most accurate ways to make sure the cause of your pain is not dangerous. If your caregiver needs more information, he or she may order lab work or imaging tests (X-rays or MRIs).However, even if imaging tests show changes in your back, this usually does not require surgery. HOME CARE INSTRUCTIONS For many people, back pain returns.Since low back pain is rarely dangerous, it is often a condition that people can learn to manageon their own.   Remain active. It is stressful on the back to sit or stand in one place. Do not sit, drive, or stand in one place for more than 30 minutes at a time. Take short walks on level surfaces as soon as pain allows.Try to increase the length of time you walk each day.   Do not stay in bed.Resting more than 1 or 2 days can delay your recovery.   Do not avoid exercise or work.Your body is made to move.It is not dangerous to be active, even though your back may hurt.Your back will likely heal faster if you return to being active before your pain is gone.   Pay attention to your body when you bend and lift. Many people have less discomfortwhen lifting if they bend their knees, keep the load close to their  bodies,and avoid twisting. Often, the most comfortable positions are those that put less stress on your recovering back.   Find a comfortable position to sleep. Use a firm mattress and lie on your side with your knees slightly bent. If you lie on your back, put a pillow under your knees.   Only take over-the-counter or prescription medicines as directed by your caregiver. Over-the-counter medicines to reduce pain and inflammation are often the most helpful.Your caregiver may prescribe muscle relaxant drugs.These medicines help dull your pain so you can more quickly return to your normal activities and healthy exercise.   Put ice on the injured area.   Put ice in a plastic bag.   Place a towel between your skin and the bag.   Leave the ice on for 15 to 20 minutes, 3 to 4 times a day for the first 2 to 3 days. After that, ice and heat may be alternated to reduce pain and spasms.   Ask your caregiver about trying back exercises and gentle massage. This may be of some benefit.   Avoid feeling anxious or stressed.Stress increases muscle tension and can worsen back pain.It is important to recognize when you are anxious or stressed and learn ways to manage it.Exercise is a great option.  SEEK MEDICAL CARE IF:  You have pain that is not relieved with rest or medicine.   You have   pain that does not improve in 1 week.   You have new symptoms.   You are generally not feeling well.  SEEK IMMEDIATE MEDICAL CARE IF:   You have pain that radiates from your back into your legs.   You develop new bowel or bladder control problems.   You have unusual weakness or numbness in your arms or legs.   You develop nausea or vomiting.   You develop abdominal pain.   You feel faint.  Document Released: 11/29/2005 Document Revised: 08/11/2011 Document Reviewed: 04/19/2011 [employer] Patient Information 2012 [employer]. 

## 2012-01-28 ENCOUNTER — Telehealth: Payer: Self-pay | Admitting: *Deleted

## 2012-01-28 NOTE — Telephone Encounter (Signed)
Pt is wanting to change from omeprazole to zantac due to cough or if there is another kind of reflux medication that she can try. She doesn't feel like the omeprazole helps.

## 2012-01-31 MED ORDER — POLYMYXIN B-TRIMETHOPRIM 10000-0.1 UNIT/ML-% OP SOLN: 1.0000 [drp] | OPHTHALMIC | Status: AC

## 2012-01-31 MED ORDER — RANITIDINE HCL 300 MG PO TABS: 300.0000 mg | ORAL_TABLET | Freq: Two times a day (BID) | ORAL | Status: DC

## 2012-01-31 NOTE — Telephone Encounter (Signed)
Rx sent in for polytrim eye drops and left message about calling in either zantac or protonix.

## 2012-01-31 NOTE — Telephone Encounter (Signed)
Pt called back saying that she wants to do Zantac 300mg  1 bid. Rx sent to pharmacy

## 2012-01-31 NOTE — Telephone Encounter (Signed)
unusre which would  Work the best .  There are no guarantees. Can try zantac 300 mg bid equivalent  Or   Can rx protonix  401 po qd 30 refill x 1  And if not getting better OV.  Ok to call in polytrim eye drops Sig 1 qtt q4 hours to affected eye while awake  Disp 1 bottle no rfill

## 2012-01-31 NOTE — Telephone Encounter (Signed)
Pt woke up this am with eye matted shut, itchy watery eye. Exposed to pink eye by one of her kids at daycare.

## 2012-02-25 ENCOUNTER — Ambulatory Visit (INDEPENDENT_AMBULATORY_CARE_PROVIDER_SITE_OTHER): Payer: BC Managed Care – PPO | Admitting: Internal Medicine

## 2012-02-25 ENCOUNTER — Encounter: Payer: Self-pay | Admitting: Internal Medicine

## 2012-02-25 VITALS — BP 120/80 | HR 60 | Temp 98.4°F | Wt 168.0 lb

## 2012-02-25 DIAGNOSIS — R002 Palpitations: Secondary | ICD-10-CM

## 2012-02-25 DIAGNOSIS — I10 Essential (primary) hypertension: Secondary | ICD-10-CM

## 2012-02-25 DIAGNOSIS — H6990 Unspecified Eustachian tube disorder, unspecified ear: Secondary | ICD-10-CM | POA: Insufficient documentation

## 2012-02-25 DIAGNOSIS — H6980 Other specified disorders of Eustachian tube, unspecified ear: Secondary | ICD-10-CM

## 2012-02-25 DIAGNOSIS — H9190 Unspecified hearing loss, unspecified ear: Secondary | ICD-10-CM

## 2012-02-25 DIAGNOSIS — H6120 Impacted cerumen, unspecified ear: Secondary | ICD-10-CM

## 2012-02-25 MED ORDER — FLUTICASONE PROPIONATE 50 MCG/ACT NA SUSP: 2.0000 | Freq: Every day | NASAL | Status: DC

## 2012-02-25 MED ORDER — HYDROCODONE-HOMATROPINE 5-1.5 MG/5ML PO SYRP: ORAL_SOLUTION | ORAL | Status: DC

## 2012-02-25 NOTE — Patient Instructions (Signed)
Your eardrum does not look infected. However he couldn't had eustachian tube dysfunction where the ear does not equalize pressure normally and your hearing can come and go with this.  I would like you to try daily no spray with a Flonase nasal cortisone and have you continue your saline nose spray.  Also can take over-the-counter antihistamine in case there is allergy drainage this is adding to this.  In the short run I am having you avoid the decongestants cause him afraid that he may get heart racing with that but sometimes these can help.   Call if you're getting increasing ear pain fever or isn't better in about a week.

## 2012-02-25 NOTE — Progress Notes (Signed)
  Subjective:    Patient ID: Gail [name], female    DOB: 1966-05-22, 46 y.o.   MRN: [id number]  HPI Patient comes in today for 2 problems because of problem with her left ear. She has had a cold sinus problem a week or so ago that was getting better however when she stands she has some intermittent decrease in her hearing on the left side. No ear pain except yesterday when she pushed on the tragal area. She did not put anything in her ear and has no fever.   She has ongoing difficulties with reflux if she doesn't take Wyn Forster has a history of asthmatic bronchitis that is better works in childcare takes care of young infants. No current smoking or exposure. She would like a refill on cough medicine if needed.  She has hypertension on medication no recent tachycardia.   Review of Systems Negative for fever chest pain shortness of breath does get reflux symptoms at times  recent cortisone injection and foot that has helped her get back to exercising.    Objective:   Physical Exam Well-developed well-nourished in no acute distress looks well today.  Mild hoarseness r throat clearing no cough    HEENT: Normocephalic ;atraumatic , Eyes;  PERRL, EOMs  Full, lids and conjunctiva clear,,Ears: no deformities, canals  moderate amount of wax in the left EAC with some flaking some questionable intermittent tenderness on tragal pole. No discharge.  Left ear was gently flushed and there was some skin flaking but the TM was intact not red and not bulging. Nose: no deformity or discharge; it is congested Mouth : OP clear without lesion or edema .  Supple without adenopathy Chest:  Clear to A&P without wheezes rales or rhonchi CV:  S1-S2 no gallops or murmurs peripheral perfusion is normal     Assessment & Plan    Left ear problem   mild wax maneuvered TM intact possible eustachian tube dysfunction  possibly related to a recent respiratory infection.  Wanted to avoid decongestants at this time we'll  have her take Flonase continue her saline spray and use an over-the-counter histamine. Should improve over the week  her ear canal slightly broad but not consistent with full-blown external otitis. Throat clearing and reflux on medication

## 2012-03-03 ENCOUNTER — Telehealth: Payer: Self-pay

## 2012-03-03 NOTE — Telephone Encounter (Signed)
Pt called and states that she had gotten the rx for cough syrup that was sent in.  Pt states she is taking her antihistamine and Flonase and it is working but pt's ear is still stopped up. Pt would like to know how long she should take the antihistamine.  Pt states her ear is still stopped.    Per Dr. Fabian Sharp pt advised to continue Flonase and take the antihistamine for 1 more week then back off to see how pt is feeling.  Pt advised to use antihistamine prn as needed during allergy season.  Pt is aware.

## 2012-04-02 ENCOUNTER — Ambulatory Visit (INDEPENDENT_AMBULATORY_CARE_PROVIDER_SITE_OTHER): Payer: BC Managed Care – PPO | Admitting: Family Medicine

## 2012-04-02 ENCOUNTER — Encounter: Payer: Self-pay | Admitting: Family Medicine

## 2012-04-02 DIAGNOSIS — J45909 Unspecified asthma, uncomplicated: Secondary | ICD-10-CM

## 2012-04-02 DIAGNOSIS — J040 Acute laryngitis: Secondary | ICD-10-CM

## 2012-04-02 MED ORDER — PREDNISONE 20 MG PO TABS: ORAL_TABLET | ORAL | Status: AC

## 2012-04-02 MED ORDER — ALBUTEROL SULFATE (2.5 MG/3ML) 0.083% IN NEBU
2.5000 mg | INHALATION_SOLUTION | Freq: Once | RESPIRATORY_TRACT | Status: AC
  Administered 2012-04-02: 2.5 mg via RESPIRATORY_TRACT

## 2012-04-02 NOTE — Patient Instructions (Signed)
Laryngitis  At the top of your windpipe is your voice box. It is the source of your voice. Inside your voice box are 2 bands of muscles called vocal cords. When you breathe, your vocal cords are relaxed and open so that air can get into the lungs. When you decide to say something, these cords come together and vibrate. The sound from these vibrations goes into your throat and comes out through your mouth as sound.  Laryngitis is an inflammation of the vocal cords that causes hoarseness, cough, loss of voice, sore throat, and dry throat. Laryngitis can be temporary (acute) or long-term (chronic). Most cases of acute laryngitis improve with time.Chronic laryngitis lasts for more than 3 weeks.  CAUSES  Laryngitis can often be related to excessive smoking, talking, or yelling, as well as inhalation of toxic fumes and allergies. Acute laryngitis is usually caused by a viral infection, vocal strain, measles or mumps, or bacterial infections. Chronic laryngitis is usually caused by vocal cord strain, vocal cord injury, postnasal drip, growths on the vocal cords, or acid reflux.  SYMPTOMS    Cough.   Sore throat.   Dry throat.  RISK FACTORS   Respiratory infections.   Exposure to irritating substances, such as cigarette smoke, excessive amounts of alcohol, stomach acids, and workplace chemicals.   Voice trauma, such as vocal cord injury from shouting or speaking too loud.  DIAGNOSIS   Your cargiver will perform a physical exam. During the physical exam, your caregiver will examine your throat. The most common sign of laryngitis is hoarseness. Laryngoscopy may be necessary to confirm the diagnosis of this condition. This procedure allows your caregiver to look into the larynx.  HOME CARE INSTRUCTIONS   Drink enough fluids to keep your urine clear or pale yellow.   Rest until you no longer have symptoms or as directed by your caregiver.   Breathe in moist air.   Take all medicine as directed by your  caregiver.   Do not smoke.   Talk as little as possible (this includes whispering).   Write on paper instead of talking until your voice is back to normal.   Follow up with your caregiver if your condition has not improved after 10 days.  SEEK MEDICAL CARE IF:    You have trouble breathing.   You cough up blood.   You have persistent fever.   You have increasing pain.   You have difficulty swallowing.  MAKE SURE YOU:   Understand these instructions.   Will watch your condition.   Will get help right away if you are not doing well or get worse.  Document Released: 11/29/2005 Document Revised: 11/18/2011 Document Reviewed: 02/04/2011  [employer] Patient Information 2012 [employer].

## 2012-04-02 NOTE — Progress Notes (Signed)
Subjective: Patient has a long history of allergies, asthma, GERD, now is hoarse. She saw her ENT are rare the week for some eustachian tube dysfunction type problems and they discussed her reflux. He increased her Prostic is over she has been hoarse the last couple of days. She has a presentation on Thursday and has said on Saturday and needs her voice.  Objective: Does not look ill but she has no voice. Her TMs are normal. Throat clear. Neck supple without significant nodes. Chest was clear to auscultation just took 4 to expiration and she has marked wheezing. Nebulizer therapy was given to her and wheeze improved.   assessment: Asthma Laryngitis  Plan She is to get her inhaler refilled. He Lucia Estelle 3 plenty of fluids Take taper dose of prednisone. She asked a question about her rosacea, and I felt it was okay for her to use the metronidazole though it was a little out of date.

## 2012-04-03 ENCOUNTER — Telehealth: Payer: Self-pay

## 2012-04-03 NOTE — Telephone Encounter (Signed)
i agree that  We should do a pulmonary consult  Please arrange referral if ok with her

## 2012-04-03 NOTE — Telephone Encounter (Signed)
Pt called and states she went to ENT on Thursday and ears are better due to Flonase and antihistamine.  Pt states her reflux medication was increased and pt was coughing at her appt and asked the ENT if she needed to see a asthma doctor.  Pt states she called [facility] and found out you can get asthma in later years but it was suggested that she may want to see an pulmonary doctor for cough and SOB.  Pt would like to know what Dr. Fabian Sharp recommends.

## 2012-04-04 NOTE — Telephone Encounter (Signed)
Left detailed message on machine for patient and referral request sent 

## 2012-04-17 ENCOUNTER — Institutional Professional Consult (permissible substitution): Payer: BC Managed Care – PPO | Admitting: Internal Medicine

## 2012-05-01 ENCOUNTER — Other Ambulatory Visit: Payer: Self-pay | Admitting: Internal Medicine

## 2012-05-03 ENCOUNTER — Ambulatory Visit (INDEPENDENT_AMBULATORY_CARE_PROVIDER_SITE_OTHER): Payer: BC Managed Care – PPO | Admitting: Internal Medicine

## 2012-05-03 ENCOUNTER — Encounter: Payer: Self-pay | Admitting: Internal Medicine

## 2012-05-03 VITALS — BP 150/90 | HR 101 | Temp 98.1°F | Ht 60.0 in | Wt 169.4 lb

## 2012-05-03 DIAGNOSIS — R053 Chronic cough: Secondary | ICD-10-CM

## 2012-05-03 DIAGNOSIS — R05 Cough: Secondary | ICD-10-CM | POA: Insufficient documentation

## 2012-05-03 DIAGNOSIS — R059 Cough, unspecified: Secondary | ICD-10-CM

## 2012-05-03 MED ORDER — PANTOPRAZOLE SODIUM 40 MG PO TBEC: 40.0000 mg | DELAYED_RELEASE_TABLET | Freq: Every day | ORAL | Status: DC

## 2012-05-03 NOTE — Patient Instructions (Addendum)
Cough is from sinus drainage, acid reflux, possibly asthma All of this is working together to cause cyclical cough/LPR cough  #Sinus drainage (unclear if this is an issue)  - start/continue netti pot daily (nurse will show you picture) -  start nasal steroid generic fluticasone inhaler 2 squirts each nostril daily as advised (nurse will do script) - follow with Dr Lazarus Salines who is planning extra sinus tests perhaps on you  #VEry likely Acid Reflux (symptoms are very c/w reflux) - take protonix 40mg  daily on empty stomach (stop prilosec)  -If protonix is expensive, take otc zegerid 20mg   1 capsule daily on empty stomach (nurse will send script)   - take diet sheet from [place] - avoid colas, spices, cheeses, spirits, red meats, beer, chocolates, fried foods etc.,   - sleep with head end of bed elevated  - eat small frequent meals  - do not go to bed for 3 hours after last meal  0   #Possible Asthma  - stop asmanex today itself - then do methacholine challenge test  In 2-3 weeks (you cannot be pregnant or have heart disease for this test)   #Cyclical cough (symptoms c/w LPR cough)  - please choose 2-3 days and observe complete voice rest - no talking or whispering  - at all times there  there is urge to cough, drink water or swallow or sip on throat lozenge  #Followup - I will see you in 4 weeks.  - at followup cough score sheet(s) - any problems call or come sooner

## 2012-05-03 NOTE — Assessment & Plan Note (Signed)
Cough is from sinus drainage, acid reflux, possibly asthma All of this is working together to cause cyclical cough/LPR cough  #Sinus drainage (unclear if this is an issue)  - start/continue netti pot daily (nurse will show you picture) -  start nasal steroid generic fluticasone inhaler 2 squirts each nostril daily as advised (nurse will do script) - follow with Dr Wolicki who is planning extra sinus tests perhaps on you  #VEry likely Acid Reflux (symptoms are very c/w reflux) - take protonix 40mg daily on empty stomach (stop prilosec)  -If protonix is expensive, take otc zegerid 20mg  1 capsule daily on empty stomach (nurse will send script)   - take diet sheet from [place] - avoid colas, spices, cheeses, spirits, red meats, beer, chocolates, fried foods etc.,   - sleep with head end of bed elevated  - eat small frequent meals  - do not go to bed for 3 hours after last meal  0   #Possible Asthma  - stop asmanex today itself - then do methacholine challenge test  In 2-3 weeks (you cannot be pregnant or have heart disease for this test)   #Cyclical cough (symptoms c/w LPR cough)  - please choose 2-3 days and observe complete voice rest - no talking or whispering  - at all times there  there is urge to cough, drink water or swallow or sip on throat lozenge  #Followup - I will see you in 4 weeks.  - at followup cough score sheet(s) - any problems call or come sooner  

## 2012-05-03 NOTE — Progress Notes (Signed)
Subjective:    Patient ID: Gail [name], female    DOB: [birth year], 46 y.o.   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 Body mass index is 33.08 kg/(m^2).  reports that she quit smoking about 25 years ago. Her smoking use included Cigarettes. She has a 3.5 pack-year smoking history. She does not have any smokeless tobacco history on file.  PCP is Lorretta Harp, MD but also sees Dr Janace Hoard in Urgent care  Referred for chronic cough:    - Says in 01-Jun-2008 mother in law passed away and then developed acute cough that same day with associated wheeze. Went to UC; cxr showed pneumonia. Recollects being treated with antibiotics and prednisone. Multiple ER visits that week. 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 Since then had persistent cough but tends to get worse when she develops recurrent URI which she is prone to because of her job.  Exposure hx: 1992-06-01 06-01-09 lived in trailer with lot of black mold (has pictures that confirm the same). Has seen ENT Dr Lazarus Salines one months ago: and told she has severe GE reflux. And started on ppi for first time but relief is only minimal. Has high bp but only on diltiazem (has hx of toprol intake but made coug worse in 2011/06/02). In terms of ge reflux: she feels she does not have it but does admit on occasion at night after full meal will wake up at night with regurgitaton and cough and at other times even after normal meal will cough. In terms of diet: one soda a day but no coffee or tea. Does apply spice to food. Wine < 1 per month. Eats cheese daily. Loves oily food. Cough is present day and night but more so early in day and late in day. Does have associated tickle in throat and clearing of throat. She has hx of high vocal cord usage (sings at church, talks all day). Most recently voice rest and prednisone seemed to help cough and albuterol prn helps cough. Asmanex  since June 01, 2008 not helping. Sometimes weather changes make cough worse.   - RSI cough score 28 and reflect very severe cough and LPR cough  - Cough differentiator she score 4 for GE reflux and 4 for neurogenic cough     Dr Gretta Cool Reflux Symptom Index (> 13-15 suggestive of LPR cough) 0 -> 5  =  none ->severe problem  Hoarseness of problem with voice 4  Clearing  Of Throat 5  Excess throat mucus or feeling of post nasal drip 4  Difficulty swallowing food, liquid or tablets 1  Cough after eating or lying down 4  Breathing difficulties or choking episodes 4  Troublesome or annoying cough 4  Sensation of something sticking in throat or lump in throat 1  Heartburn, chest pain, indigestion, or stomach acid coming up 1  TOTAL 28     Kouffman Reflux v Neurogenic Cough Differentiator Reflux Neurogenic Comments  Do you awaken from a sound sleep coughing violently?                            With trouble breathing?     Do you have choking episodes when you cannot  Get enough air, gasping for air ?              Yes  Do you usually cough when you lie down into  The bed, or when you just lie down to rest ?                          Yes    Do you usually cough after meals or eating?         Yes    Do you cough when (or after) you bend over?    Yes    Do you more-or-less cough all day long?  Yes   Does change of temperature make you cough?  Yes   Does laughing or chuckling cause you to cough?  YEs   Do fumes (perfume, automobile fumes, burned  Toast, etc.,) cause you to cough ?          Does speaking, singing, or talking on the phone cause you to cough   ?                Yes   Total 4 4        Past Medical History  Diagnosis Date  . Hyperlipidemia   . Hypertension   . PCO (polycystic ovaries)   . Anal fissure   . Arthritis   . Allergy     History of shots as a child  . Rosacea   . History of infertility, female   . IBS (irritable bowel syndrome)   . Asthma     Reflux induced       Family History  Problem Relation Age of Onset  . Hypertension    . Lung cancer      uncle  . Breast cancer      great aunt     History   Social History  . Marital Status: Married    Spouse Name: N/A    Number of Children: 0  . Years of Education: N/A   Occupational History  . INFANT TEACHER    Social History Main Topics  . Smoking status: Former Smoker -- 0.5 packs/day for 7 years    Types: Cigarettes    Quit date: 12/13/1986  . Smokeless tobacco: Not on file  . Alcohol Use: Yes     socially  . Drug Use: No  . Sexually Active: Not on file   Other Topics Concern  . 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     Allergies  Allergen Reactions  . Codeine Itching  . Erythromycin Ethylsuccinate     Stomach hurts REACTION: unspecified   can take azithromycin  . Latex Rash     Outpatient Prescriptions Prior to Visit  Medication Sig Dispense Refill  . albuterol (VENTOLIN HFA) 108 (90 BASE) MCG/ACT inhaler        . Boric Acid POWD by Does not apply route as needed.       . diclofenac (VOLTAREN) 75 MG EC tablet Take 1 tablet (75 mg total) by mouth 2 (two) times daily with a meal.  60 tablet  2  . diltiazem (CARDIZEM CD) 240 MG 24 hr capsule Take 1 capsule (240 mg total) by mouth daily.  30 capsule  12  . fluticasone (FLONASE) 50 MCG/ACT nasal spray Place 2 sprays into the nose daily.  16 g  6  . HYDROcodone-homatropine (HYDROMET) 5-1.5 MG/5ML syrup 1-2  tsp every 4-6 hours as needed for cough  180 mL  0  . Lactobacillus (ACIDOPHILUS) 10 MG CAPS Take by mouth daily.       Marland Kitchen levonorgestrel (MIRENA) 20 MCG/24HR IUD 1 each by Intrauterine route once.        . mometasone (ASMANEX) 220 MCG/INH inhaler Inhale 1 puff into the lungs daily.        Marland Kitchen nystatin (MYCOSTATIN) cream Apply topically 2 (two) times daily.        .  Zinc Oxide 30.6 % CREA Apply topically 2 (two) times daily.       Marland Kitchen omeprazole (PRILOSEC OTC) 20 MG tablet Take 20 mg by mouth daily.        . polyethylene glycol powder (GLYCOLAX/MIRALAX) powder Take 17 g by mouth daily.        . Probiotic Product (ACIDOPHILUS PEARLS) CAPS Take 1 capsule by mouth daily.              Review of Systems  Constitutional: Negative for fever and unexpected weight change.  HENT: Positive for congestion, voice change and postnasal drip. Negative for ear pain, nosebleeds, sore throat, rhinorrhea, sneezing, trouble swallowing, dental problem and sinus pressure.   Eyes: Negative for redness and itching.  Respiratory: Positive for cough and shortness of breath. Negative for chest tightness and wheezing.   Cardiovascular: Negative for palpitations and leg swelling.  Gastrointestinal: Negative for nausea and vomiting.  Genitourinary: Negative for dysuria.  Musculoskeletal: Positive for joint swelling.  Skin: Negative for rash.  Neurological: Negative for headaches.  Hematological: Does not bruise/bleed easily.  Psychiatric/Behavioral: Negative for dysphoric mood. The patient is not nervous/anxious.        Objective:   Physical Exam  Vitals reviewed. Constitutional: She is oriented to person, place, and time. She appears well-developed and well-nourished. No distress.       Body mass index is 33.08 kg/(m^2).   HENT:  Head: Normocephalic and atraumatic.  Right Ear: External ear normal.  Left Ear: External ear normal.  Mouth/Throat: Oropharynx is clear and moist. No oropharyngeal exudate.  Eyes: Conjunctivae and EOM are normal. Pupils are equal, round, and reactive to light. Right eye exhibits no discharge. Left eye exhibits no discharge. No scleral icterus.  Neck: Normal range of motion. Neck supple. No JVD present. No tracheal deviation present. No thyromegaly present.       Mild post nasal drainage Clears throat periodically  Cardiovascular: Normal rate,  regular rhythm, normal heart sounds and intact distal pulses.  Exam reveals no gallop and no friction rub.   No murmur heard. Pulmonary/Chest: Effort normal and breath sounds normal. No respiratory distress. She has no wheezes. She has no rales. She exhibits no tenderness.  Abdominal: Soft. Bowel sounds are normal. She exhibits no distension and no mass. There is no tenderness. There is no rebound and no guarding.  Musculoskeletal: Normal range of motion. She exhibits no edema and no tenderness.  Lymphadenopathy:    She has no cervical adenopathy.  Neurological: She is alert and oriented to person, place, and time. She has normal reflexes. No cranial nerve deficit. She exhibits normal muscle tone. Coordination normal.  Skin: Skin is warm and dry. No rash noted. She is not diaphoretic. No erythema. No pallor.  Psychiatric: She has a normal mood and affect. Her behavior is normal. Judgment and thought content normal.          Assessment & Plan:

## 2012-05-11 ENCOUNTER — Other Ambulatory Visit: Payer: Self-pay | Admitting: Internal Medicine

## 2012-05-24 ENCOUNTER — Encounter (HOSPITAL_COMMUNITY): Payer: BC Managed Care – PPO

## 2012-06-01 ENCOUNTER — Telehealth: Payer: Self-pay | Admitting: Internal Medicine

## 2012-06-01 NOTE — Telephone Encounter (Signed)
LMOMTCB x 1 for pt. Is pt referring to the GERD Diet?

## 2012-06-02 NOTE — Telephone Encounter (Signed)
Spoke with pt. She states that she never received the GERD diet on the day of her last visit. She states that she is needing this mailed to her. I verified the address and mailed.   She also wanted to let MR know that she had CT scan of her sinuses which was normal. She is unable to afford MCT at this time, but will discuss more at next ov. Will forward to MR as FYI.

## 2012-06-02 NOTE — Telephone Encounter (Signed)
lmomtcb  

## 2012-06-03 NOTE — Telephone Encounter (Signed)
Holding off the methacholine challenge is fine but she needs to do the sinus advice and gerd advice very strictly as advised on last OV 05/03/12  Also, just curious if she is still taking asmanex or not ?    I wanted to see her in 4 weeks after prior OV on 05/03/12 but I see no followup. You can set her with Tammy because I am booked  Thanks  MR

## 2012-06-05 NOTE — Telephone Encounter (Signed)
Called, spoke with pt.  I informed her of below per MR.  She has stopped the asmanex.  We have scheduled her for a 4 wk follow up with TP on this Friday, June 28 at 3:45 pm.  She is aware and verbalized understanding of OV and MR's recs.  Nothing further needed at this time.  Will sign off on msg and route it to MR so he is aware of the asmanex.

## 2012-06-06 NOTE — Telephone Encounter (Signed)
Ok thanks 

## 2012-06-09 ENCOUNTER — Ambulatory Visit (INDEPENDENT_AMBULATORY_CARE_PROVIDER_SITE_OTHER): Payer: BC Managed Care – PPO | Admitting: Adult Health

## 2012-06-09 ENCOUNTER — Ambulatory Visit
Admission: RE | Admit: 2012-06-09 | Discharge: 2012-06-09 | Disposition: A | Discharge status: Home / Self Care | Payer: BC Managed Care – PPO | Source: Ambulatory Visit | Attending: Adult Health | Admitting: Adult Health

## 2012-06-09 ENCOUNTER — Encounter: Payer: Self-pay | Admitting: Adult Health

## 2012-06-09 VITALS — BP 138/88 | HR 82 | Temp 97.0°F | Ht 60.0 in | Wt 168.2 lb

## 2012-06-09 DIAGNOSIS — R05 Cough: Secondary | ICD-10-CM

## 2012-06-09 DIAGNOSIS — R053 Chronic cough: Secondary | ICD-10-CM

## 2012-06-09 DIAGNOSIS — R059 Cough, unspecified: Secondary | ICD-10-CM

## 2012-06-09 MED ORDER — BENZONATATE 200 MG PO CAPS: 200.0000 mg | ORAL_CAPSULE | Freq: Three times a day (TID) | ORAL | Status: AC | PRN

## 2012-06-09 NOTE — Patient Instructions (Addendum)
GERD diet.  Increase Protonix 40mg  Twice daily  Before meal  Add Chlortrimeton 4mg  2 At bedtime   Delsym 2 tsp Twice daily   Tessalon Three times a day   Voice rest if possible.  Sips of water, sugarless candy. (no mint prodcuts) to help avoid cough and throat clearing.  I will call with xray results.  Please contact office for sooner follow up if symptoms do not improve or worsen or seek emergency care  follow up Dr. Marchelle [name] in 4-6 weeks or Gail [name]

## 2012-06-09 NOTE — Progress Notes (Signed)
Subjective:    Patient ID: Gail [name], female    DOB: [birth year], 46 y.o.   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 Body mass index is 33.08 kg/(m^2).  reports that she quit smoking about 25 years ago. Her smoking use included Cigarettes. She has a 3.5 pack-year smoking history. She does not have any smokeless tobacco history on file.  PCP is Lorretta Harp, MD but also sees Dr Janace Hoard in Urgent care  Referred for chronic cough:    - Says in 01-Jun-2008 mother in law passed away and then developed acute cough that same day with associated wheeze. Went to UC; cxr showed pneumonia. Recollects being treated with antibiotics and prednisone. Multiple ER visits that week. 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 Since then had persistent cough but tends to get worse when she develops recurrent URI which she is prone to because of her job.  Exposure hx: 1992-06-01 06-01-09 lived in trailer with lot of black mold (has pictures that confirm the same). Has seen ENT Dr Lazarus Salines one months ago: and told she has severe GE reflux. And started on ppi for first time but relief is only minimal. Has high bp but only on diltiazem (has hx of toprol intake but made coug worse in 2011/06/02). In terms of ge reflux: she feels she does not have it but does admit on occasion at night after full meal will wake up at night with regurgitaton and cough and at other times even after normal meal will cough. In terms of diet: one soda a day but no coffee or tea. Does apply spice to food. Wine < 1 per month. Eats cheese daily. Loves oily food. Cough is present day and night but more so early in day and late in day. Does have associated tickle in throat and clearing of throat. She has hx of high vocal cord usage (sings at church, talks all day). Most recently voice rest and prednisone seemed to help cough and albuterol prn helps cough. Asmanex  since June 01, 2008 not helping. Sometimes weather changes make cough worse.   - RSI cough score 28 and reflect very severe cough and LPR cough  - Cough differentiator she score 4 for GE reflux and 4 for neurogenic cough     Dr Gretta Cool Reflux Symptom Index (> 13-15 suggestive of LPR cough) 0 -> 5  =  none ->severe problem  Hoarseness of problem with voice 4  Clearing  Of Throat 5  Excess throat mucus or feeling of post nasal drip 4  Difficulty swallowing food, liquid or tablets 1  Cough after eating or lying down 4  Breathing difficulties or choking episodes 4  Troublesome or annoying cough 4  Sensation of something sticking in throat or lump in throat 1  Heartburn, chest pain, indigestion, or stomach acid coming up 1  TOTAL 28     Kouffman Reflux v Neurogenic Cough Differentiator Reflux Neurogenic Comments  Do you awaken from a sound sleep coughing violently?                            With trouble breathing?     Do you have choking episodes when you cannot  Get enough air, gasping for air ?              Yes  Do you usually cough when you lie down into  The bed, or when you just lie down to rest ?                          Yes    Do you usually cough after meals or eating?         Yes    Do you cough when (or after) you bend over?    Yes    Do you more-or-less cough all day long?  Yes   Does change of temperature make you cough?  Yes   Does laughing or chuckling cause you to cough?  YEs   Do fumes (perfume, automobile fumes, burned  Toast, etc.,) cause you to cough ?          Does speaking, singing, or talking on the phone cause you to cough   ?                Yes   Total 4 4    06/09/2012 Follow up  She is feeling better but cough is not gone  Last ov with upper airway cough ? Reflux related Changed to protonix .  asmanex stopped  Does feel some better.  Wants to wait on methacholine challenge test.  Is using GERD diet.  No chest pain , weight loss  Or fever.  No recent cxr.        Past Medical History  Diagnosis Date  . Hyperlipidemia   . Hypertension   . PCO (polycystic ovaries)   . Anal fissure   . Arthritis   . Allergy     History of shots as a child  . Rosacea   . History of infertility, female   . IBS (irritable bowel syndrome)   . Asthma     Reflux induced     Family History  Problem Relation Age of Onset  . Hypertension    . Lung cancer      uncle  . Breast cancer      great aunt     History   Social History  . Marital Status: Married    Spouse Name: N/A    Number of Children: 0  . Years of Education: N/A   Occupational History  . INFANT TEACHER    Social History Main Topics  . Smoking status: Former Smoker -- 0.5 packs/day for 7 years    Types: Cigarettes    Quit date: 12/13/1986  . Smokeless tobacco: Not on file  . Alcohol Use: Yes     socially  . Drug Use: No  . Sexually Active: Not on file   Other Topics Concern  . 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     Allergies  Allergen Reactions  . Codeine Itching  . Erythromycin Ethylsuccinate     Stomach hurts REACTION: unspecified   can take azithromycin  . Latex Rash     Outpatient Prescriptions Prior to Visit  Medication Sig Dispense Refill  . albuterol (VENTOLIN HFA) 108 (90 BASE) MCG/ACT inhaler        . Boric Acid POWD by Does not apply route as needed.       . diclofenac (VOLTAREN) 75 MG EC tablet Take 1 tablet (75 mg total) by mouth 2 (  two) times daily with a meal.  60 tablet  2  . diltiazem (CARDIZEM CD) 240 MG 24 hr capsule Take 1 capsule (240 mg total) by mouth daily.  30 capsule  12  . fluticasone (FLONASE) 50 MCG/ACT nasal spray Place 2 sprays into the nose daily.  16 g  6  . HYDROcodone-homatropine (HYDROMET) 5-1.5 MG/5ML syrup 1-2 tsp every 4-6 hours as needed for cough  180 mL  0  .  Lactobacillus (ACIDOPHILUS) 10 MG CAPS Take by mouth daily.       Marland Kitchen levonorgestrel (MIRENA) 20 MCG/24HR IUD 1 each by Intrauterine route once.        . mometasone (ASMANEX) 220 MCG/INH inhaler Inhale 1 puff into the lungs daily.        Marland Kitchen nystatin (MYCOSTATIN) cream Apply topically 2 (two) times daily.        . Zinc Oxide 30.6 % CREA Apply topically 2 (two) times daily.       Marland Kitchen omeprazole (PRILOSEC OTC) 20 MG tablet Take 20 mg by mouth daily.        . polyethylene glycol powder (GLYCOLAX/MIRALAX) powder Take 17 g by mouth daily.        . Probiotic Product (ACIDOPHILUS PEARLS) CAPS Take 1 capsule by mouth daily.              Review of Systems  Constitutional:   No  weight loss, night sweats,  Fevers, chills,  fatigue, or  lassitude.  HEENT:   No headaches,  Difficulty swallowing,  Tooth/dental problems, or  Sore throat,                No sneezing, itching, ear ache, nasal congestion, post nasal drip,   CV:  No chest pain,  Orthopnea, PND, swelling in lower extremities, anasarca, dizziness, palpitations, syncope.   GI  No heartburn, indigestion, abdominal pain, nausea, vomiting, diarrhea, change in bowel habits, loss of appetite, bloody stools.   Resp:  ,  No coughing up of blood.   Marland Kitchen  No chest wall deformity  Skin: no rash or lesions.  GU: no dysuria, change in color of urine, no urgency or frequency.  No flank pain, no hematuria   MS:  No joint pain or swelling.  No decreased range of motion.  No back pain.  Psych:  No change in mood or affect. No depression or anxiety.  No memory loss.          Objective:   GEN: A/Ox3; pleasant , NAD, well nourished   HEENT:  [place]/AT,  EACs-clear, TMs-wnl, NOSE-clear, THROAT-clear, no lesions, no postnasal drip or exudate noted.   NECK:  Supple w/ fair ROM; no JVD; normal carotid impulses w/o bruits; no thyromegaly or nodules palpated; no lymphadenopathy.  RESP  Clear  P & A; w/o, wheezes/ rales/ or rhonchi.no accessory muscle use, no  dullness to percussion  CARD:  RRR, no m/r/g  , no peripheral edema, pulses intact, no cyanosis or clubbing.  GI:   Soft & nt; nml bowel sounds; no organomegaly or masses detected.  Musco: Warm bil, no deformities or joint swelling noted.   Neuro: alert, no focal deficits noted.    Skin: Warm, no lesions or rashes          Assessment & Plan:

## 2012-06-09 NOTE — Assessment & Plan Note (Addendum)
Flare  Check xray today  Plan;  GERD diet.  Increase Protonix 40mg  Twice daily  Before meal  Add Chlortrimeton 4mg  2 At bedtime   Delsym 2 tsp Twice daily   Tessalon Three times a day   Voice rest if possible.  Sips of water, sugarless candy. (no mint prodcuts) to help avoid cough and throat clearing.  I will call with xray results.  Please contact office for sooner follow up if symptoms do not improve or worsen or seek emergency care  follow up Dr. Marchelle Gearing in 4-6 weeks or Mallori Araque

## 2012-06-12 ENCOUNTER — Ambulatory Visit (INDEPENDENT_AMBULATORY_CARE_PROVIDER_SITE_OTHER)
Admission: RE | Admit: 2012-06-12 | Discharge: 2012-06-12 | Disposition: A | Discharge status: Home / Self Care | Payer: BC Managed Care – PPO | Source: Ambulatory Visit | Attending: Adult Health | Admitting: Adult Health

## 2012-06-12 DIAGNOSIS — R05 Cough: Secondary | ICD-10-CM

## 2012-06-12 DIAGNOSIS — R059 Cough, unspecified: Secondary | ICD-10-CM

## 2012-06-14 ENCOUNTER — Telehealth: Payer: Self-pay | Admitting: Internal Medicine

## 2012-06-14 MED ORDER — PREDNISONE 20 MG PO TABS: ORAL_TABLET | ORAL | Status: DC

## 2012-06-14 NOTE — Telephone Encounter (Signed)
Pt called and stated she had poison ivy on her legs.  Requesting prednisone be called in to the [facility] in [place].  Please advise.

## 2012-06-14 NOTE — Telephone Encounter (Signed)
If no fever as stated can begin pred isone   Fu if not getting better.

## 2012-06-14 NOTE — Telephone Encounter (Signed)
Filled and sent to the pharmacy by [facility].

## 2012-06-14 NOTE — Telephone Encounter (Signed)
Caller: Bobbette/Patient; PCP: Madelin Headings.; CB#: [phone]; ; ; Call regarding Poison Ivy; she has been home treating poison ivy with Ivy rest for past 2 weeks, normally needs Prednisone to treat it, she has it scattered over body and asking about care, < 25% of body,afebrile,she has Prednisone 10mg  PO at home for asthma,21 pills, asking if she can start the Prednisone,denies emergent s/s per Poison Ivy Guideline,See in 24 hr disposition obtained per home treatment not helping, note to office per request, callback perimeters gone over

## 2012-07-06 ENCOUNTER — Telehealth: Payer: Self-pay | Admitting: Internal Medicine

## 2012-07-06 NOTE — Telephone Encounter (Signed)
LMOMTCB x 1 

## 2012-07-07 NOTE — Telephone Encounter (Signed)
Pt called to says she is going to try protonix in the morning and omeprazole in the evenings to see if this helps first before having protonix twice daily sent to the pharmacy and she needed clarification on how often ton use the Delsym which is twice a day. Pt verbalized understanding.

## 2012-07-07 NOTE — Telephone Encounter (Signed)
[id number]. [employer], CMA  

## 2012-09-08 ENCOUNTER — Telehealth: Payer: Self-pay | Admitting: Adult Health

## 2012-09-08 NOTE — Telephone Encounter (Signed)
Spoke with patient-states that Meth challenge test is too expensive and would like to know if there are any other ways to determine asthma vs GERD/acid reflux. Pt states she get ups and takes her Protonix and waits to eat about 1 hour after; between that time she is having a cough "like no other", clearing her throat a lot and feels like there is a lot of mucus in there. Once she eats then the symptoms of cough clears up for the most part. Pt has only been seen by GI for IBS in the past. Pt aware that MR is back in the office on Monday and will advise then.

## 2012-09-11 NOTE — Telephone Encounter (Signed)
lmomtcb  

## 2012-09-11 NOTE — Telephone Encounter (Signed)
Other option is to try inhaled pulmicort as an empiric challenge but then she had been on asmanex since 2009.  Best is she come in for visit after 09/19/12 so we can discuss comprehensive mgmt

## 2012-09-12 NOTE — Telephone Encounter (Signed)
ATC patient no answer LMOMTCB 

## 2012-09-12 NOTE — Telephone Encounter (Signed)
LMOM TCB x2

## 2012-09-12 NOTE — Telephone Encounter (Signed)
Pt returned call. Gail [name]  

## 2012-09-13 NOTE — Telephone Encounter (Signed)
LMOMTCB x 1 

## 2012-09-13 NOTE — Telephone Encounter (Signed)
lmomtcb x1 

## 2012-09-13 NOTE — Telephone Encounter (Signed)
I spoke with pt and is aware of MR recs. She is scheduled to see4 MR 09/26/12

## 2012-09-13 NOTE — Telephone Encounter (Signed)
Pt returned call.  Holly D Pryor ° °

## 2012-09-29 ENCOUNTER — Encounter: Payer: Self-pay | Admitting: Internal Medicine

## 2012-09-29 ENCOUNTER — Ambulatory Visit (INDEPENDENT_AMBULATORY_CARE_PROVIDER_SITE_OTHER): Payer: BC Managed Care – PPO | Admitting: Internal Medicine

## 2012-09-29 VITALS — BP 156/94 | HR 84 | Temp 98.0°F | Wt 163.0 lb

## 2012-09-29 DIAGNOSIS — L0293 Carbuncle, unspecified: Secondary | ICD-10-CM | POA: Insufficient documentation

## 2012-09-29 DIAGNOSIS — J45909 Unspecified asthma, uncomplicated: Secondary | ICD-10-CM

## 2012-09-29 DIAGNOSIS — L719 Rosacea, unspecified: Secondary | ICD-10-CM | POA: Insufficient documentation

## 2012-09-29 DIAGNOSIS — L0292 Furuncle, unspecified: Secondary | ICD-10-CM

## 2012-09-29 MED ORDER — METRONIDAZOLE 1 % EX GEL: Freq: Every day | CUTANEOUS | Status: DC

## 2012-09-29 MED ORDER — BECLOMETHASONE DIPROPIONATE 80 MCG/ACT IN AERS: 2.0000 | INHALATION_SPRAY | Freq: Two times a day (BID) | RESPIRATORY_TRACT | Status: DC

## 2012-09-29 MED ORDER — OMEPRAZOLE-SODIUM BICARBONATE 40-1100 MG PO CAPS: 1.0000 | ORAL_CAPSULE | Freq: Every day | ORAL | Status: DC

## 2012-09-29 NOTE — Patient Instructions (Signed)
Lift head of your bed as directed See anti reflux handout Use warm compress and triple antibiotic ointment to chin as directed. Monitor your blood pressure at home Follow up with Dr. Fabian Sharp within 1 month

## 2012-09-29 NOTE — Assessment & Plan Note (Signed)
46 year old female has resolving carbuncle on left lower chin. It has drained on its own. I advised against using oral antibiotics. Patient advised to continue local care. Use warm compress and apply over-the-counter triple antibiotic ointment.  Patient may have MRSA. She was advised to try using triple antibiotic ointment to inside her nares 3-4 times a day for 1 to 2 weeks.

## 2012-09-29 NOTE — Progress Notes (Signed)
Subjective:    Patient ID: Gail [name], female    DOB: [birth year], 46 y.o.   MRN: [id number]  HPI  46 year old white female with multiple complaints. Patient has history of intermittent boils. She reports previous skin lesions were tested for MRSA in the past (negative). Most recently, she developed skin lesion on left lower chin.  2 days ago area was red and swollen. Yesterday evening,  area drained on its own. Her skin lesion is much smaller and she is feeling better. She denies fever or chills.  She has history of rosacea. She ran out of MetroGel.  History of asthma. Her symptoms thought to be aggravated by reflux disease. Patient reports persistent tickle in her throat despite using protonic twice daily. She discontinued Asmanex.  Review of Systems Negative for fever or chills  Past Medical History  Diagnosis Date  . Hyperlipidemia   . Hypertension   . PCO (polycystic ovaries)   . Anal fissure   . Arthritis   . Allergy     History of shots as a child  . Rosacea   . History of infertility, female   . IBS (irritable bowel syndrome)   . Asthma     Reflux induced    History   Social History  . Marital Status: Married    Spouse Name: N/A    Number of Children: 0  . Years of Education: N/A   Occupational History  . INFANT TEACHER    Social History Main Topics  . Smoking status: Former Smoker -- 0.5 packs/day for 7 years    Types: Cigarettes    Quit date: 12/13/1986  . Smokeless tobacco: Not on file  . Alcohol Use: Yes     socially  . Drug Use: No  . Sexually Active: Not on file   Other Topics Concern  . 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    Past Surgical History  Procedure Date  . Carpal tunnel release     x2 bilateral  . Wisdom tooth extraction   . Knee arthroscopy     left   . Wrist  tendon release     bilateral thumbs    Family History  Problem Relation Age of Onset  . Hypertension    . Lung cancer      uncle  . Breast cancer      great aunt    Allergies  Allergen Reactions  . Codeine Itching  . Erythromycin Ethylsuccinate     Stomach hurts REACTION: unspecified   can take azithromycin  . Latex Rash    Current Outpatient Prescriptions on File Prior to Visit  Medication Sig Dispense Refill  . albuterol (VENTOLIN HFA) 108 (90 BASE) MCG/ACT inhaler Inhale into the lungs as needed.       . Boric Acid POWD by Does not apply route as needed.       . diclofenac (VOLTAREN) 75 MG EC tablet Take 75 mg by mouth as needed.      . fluticasone (FLONASE) 50 MCG/ACT nasal spray Place 2 sprays into the nose daily.  16 g  6  . Lactobacillus (ACIDOPHILUS) 10 MG CAPS Take by mouth daily.       Marland Kitchen levonorgestrel (MIRENA) 20 MCG/24HR IUD 1 each by Intrauterine route once.        Marland Kitchen  nystatin (MYCOSTATIN) cream Apply topically as needed.       . Zinc Oxide 30.6 % CREA Apply topically as needed.       Marland Kitchen DISCONTD: diltiazem (CARDIZEM CD) 240 MG 24 hr capsule Take 1 capsule (240 mg total) by mouth daily.  30 capsule  12  . beclomethasone (QVAR) 80 MCG/ACT inhaler Inhale 2 puffs into the lungs 2 (two) times daily.  1 Inhaler  3  . omeprazole-sodium bicarbonate (ZEGERID) 40-1100 MG per capsule Take 1 capsule by mouth daily before breakfast.  30 capsule  3  . DISCONTD: ranitidine (ZANTAC) 300 MG tablet Take 1 tablet (300 mg total) by mouth 2 (two) times daily.  60 tablet  1    BP 156/94  Pulse 84  Temp 98 F (36.7 C) (Oral)  Wt 163 lb (73.936 kg)       Objective:   Physical Exam  Constitutional: She is oriented to person, place, and time. She appears well-developed and well-nourished.  HENT:  Head: Normocephalic and atraumatic.  Mouth/Throat: Oropharynx is clear and moist.  Cardiovascular: Normal rate, regular rhythm and normal heart sounds.   No murmur  heard. Pulmonary/Chest: Effort normal and breath sounds normal. She has no wheezes.  Musculoskeletal: She exhibits no edema.  Neurological: She is alert and oriented to person, place, and time.  Skin:       Slight facial redness,  Small subcentimeter red skin lesion left lower chin.  Non tender.  No fluctuance, no drainage  Psychiatric: She has a normal mood and affect. Her behavior is normal.          Assessment & Plan:

## 2012-09-29 NOTE — Assessment & Plan Note (Signed)
Patient has persistent cough. Her symptoms thought to be aggravated by gastroesophageal reflux disease. GERD symptoms are inadequately controlled with protonix. Switch to Zegerid. Patient also advised to raise head of  bed and anti-reflux handout provided.

## 2012-09-29 NOTE — Assessment & Plan Note (Signed)
Patient has periodic flares. She was seen by dermatologist in the past. Refilled MetroGel.

## 2012-10-06 ENCOUNTER — Ambulatory Visit (INDEPENDENT_AMBULATORY_CARE_PROVIDER_SITE_OTHER): Payer: BC Managed Care – PPO | Admitting: Internal Medicine

## 2012-10-06 ENCOUNTER — Encounter: Payer: Self-pay | Admitting: Internal Medicine

## 2012-10-06 VITALS — BP 138/88 | HR 85 | Temp 98.6°F | Ht 60.0 in | Wt 164.2 lb

## 2012-10-06 DIAGNOSIS — E8881 Metabolic syndrome: Secondary | ICD-10-CM

## 2012-10-06 DIAGNOSIS — R059 Cough, unspecified: Secondary | ICD-10-CM

## 2012-10-06 DIAGNOSIS — R05 Cough: Secondary | ICD-10-CM

## 2012-10-06 DIAGNOSIS — R053 Chronic cough: Secondary | ICD-10-CM

## 2012-10-06 MED ORDER — GABAPENTIN 300 MG PO CAPS: ORAL_CAPSULE | ORAL | Status: DC

## 2012-10-06 NOTE — Patient Instructions (Addendum)
Gail  [name] is improved but we need to work hard to get rid of [name] completely The fact [name] did not worsen when went off asmanex suggests asthma unlikely. Therefore (and due to cost) will hold off methacholine challenge test Continue sinus advice given by me and ENT Dr Lazarus Salines Contniue acid reflux advice - you can change to zegerid but diet is important and outlined below For irritable larynx  - Take gabapentin 300mg  once daily x 3 days, then 300mg  twice daily x 3 days, then 300mg  three times daily to continue. If this makes you too sleepy or drowsy call us and we will cut your medication dosing down REturn in 6 weeks with [name] score at followup If [name] does not resolve, will consider speech therapy, CT chest and/or methacholine challenge      #WEight Management     - we discussed extensively about weight management   - follow low glycemic diet plan that I outlined for you after extensive discussion. Do not follow other plans  - General  - drink lot of water  - avoid all moderate and high glycemic foods especially bad fruits, breads, pastas, fried foods, battered foods, sugary foods  - make non-starchy vegetables your base in terms of volume you eat  - always make sure you balance good carbs, good protein and good fat source  - good carbs are non-starchy vegetables, uncanned beans in the left column and low glycemic fruits in the left colum  - good protein source is egg white, beans, tofu, fish, chicken breast, fish, [place] and bison. Remember meat has to be skinless  - good healthy fat source is nuts, and fish   - focusing on eating right healthy foods (left lane) and avoiding unhealthy foods (middle and right lane) is better way to lose weight than to go hypo-caloric  - focus on staying full by eating right  - having a daily and weekly plan for what you will eat and where you will eat depending on your work, social life schedule is very important   - watch out for misleading labels  on grocery aisle: High Fiber and Low Fat labeled foods generally are high in bad carbs or sugar  - measure weight once  a week  - discipline and attitude is key. Do not care for anyone else's opinion or feelings. Only yours matters   - For breakfast  - most important meal of the day. So, eat daily breakfast. Do not skip   - recommend 1/2 to 1 cup steel cut oat meal or 1/2 to 1 cup fiber one 60 cal   Or  1 to 1.5 cups Kashi go-lean with non-fat plain milk or 60 Cal Silk Soy mild. Can add Berries. Can have egg at same time for breakfast  - For snacks  - recommend total 2-3 snacks per day  - snack should be light and filling  - best times are between breakfast and lunch, lunch and dinner and sometimes post-dinner snack  - Nut are great snacks. Stick to low glycemic nuts (less than 50gm per day) and eat only the nuts in the left lane like peanuts, pista, almond, walnut  - Low glycemic fruits are great snacks. Have 1-2 servings each day of fruits from the left lane   - If you like yogurt or cottage cheese - recommend [place] or Fage 0% greek yogourt or Plan non-fat yogurt or [place]stone non-fat cottage cheese. Theyse have the least sugar. Do no exceed 100-200 gram  per day. Fruit yogurts are the worst  - For Lunch and dinner  - unlimited non-starchy vegetable (prefer raw fresh or roasted or grilled) with skinless chicken or fish  - Special Notes  - Nuts: Nut are great snacks and have heart benefits. Stick to low glycemic nuts (less than 50gm per day) and eat only the nuts in the left lane like peanuts, pista, almond, walnuts  -  Ok to eat above nuts daily but only < 50gm/day  - If you eat more than 50gm/day then you run risk of eating too many calories or saturated fat  - AVoid nuts glazed with sugar. Nuts have to be in salted/original form or roasted   - Fruits: Eat 1-2 fresh fruit servings daily but fruits can be dangerous because of high sugar content. So, choose your fruits wisely. Eat only the  low glycemic fruits (left lane). Eat them fresh.  Do not eat them canned  - Avoid all fresh juices except if you use the low glycemic fruits and make them yourself without adding extra sugar   - Dairy: Is optional. Eat zero fat or low fat, fruit free yogurts or cottage cheese but not more than 100-200g per day  - Restaurant  - all restaurants have bad and good choices. Even fast food restaurants offer you good choices  - at restaurants do no fall prey to social pressure.. One way to eat healthy at restaurant is to eat healthy snack or light healthy meal before you go to restaurant so that will prevent your cravings  - Restaurants with worst choices: [place] (except [employer], or [employer]), [place], [place]. At these restaurants avoid the bread, curry, fried and battered foods and chips  - Restaurants with best choices: greek, [place], [place], [place] (again here avoid bread, deep fried stuffed and pasta)  - Restaurants with Ok choice: McDonald's, [employer], [employer] (again here avoid the bread, fried stuff, fried meat)  - Always ask for grilled meat or vegetables, and fresh salad choices (get your salad dressing as low fat and to the side)

## 2012-10-06 NOTE — Progress Notes (Signed)
Subjective:    Patient ID: Gail [name], female    DOB: [birth year], 46 y.o.   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 Body mass index is 33.08 kg/(m^2).  reports that she quit smoking about 25 years ago. Her smoking use included Cigarettes. She has a 3.5 pack-year smoking history. She does not have any smokeless tobacco history on file.  PCP is Lorretta Harp, MD but also sees Dr Janace Hoard in Urgent care  Referred for chronic cough:    - Says in May 04, 2008 mother in law passed away and then developed acute cough that same day with associated wheeze. Went to UC; cxr showed pneumonia. Recollects being treated with antibiotics and prednisone. Multiple ER visits that week. 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 Since then had persistent cough but tends to get worse when she develops recurrent URI which she is prone to because of her job.  Exposure hx: 05-04-1992 2009/05/04 lived in trailer with lot of black mold (has pictures that confirm the same). Has seen ENT Dr Lazarus Salines one months ago: and told she has severe GE reflux. And started on ppi for first time but relief is only minimal. Has high bp but only on diltiazem (has hx of toprol intake but made coug worse in May 05, 2011). In terms of ge reflux: she feels she does not have it but does admit on occasion at night after full meal will wake up at night with regurgitaton and cough and at other times even after normal meal will cough. In terms of diet: one soda a day but no coffee or tea. Does apply spice to food. Wine < 1 per month. Eats cheese daily. Loves oily food. Cough is present day and night but more so early in day and late in day. Does have associated tickle in throat and clearing of throat. She has hx of high vocal cord usage (sings at church, talks all day). Most recently voice rest and prednisone seemed to help cough and albuterol prn helps cough. Asmanex  since May 04, 2008 not helping. Sometimes weather changes make cough worse.   - RSI cough score 28 and reflect very severe cough and LPR cough  - Cough differentiator she score 4 for GE reflux and 4 for neurogenic cough    REC Cough is from sinus drainage, acid reflux, possibly asthma All of this is working together to cause cyclical cough/LPR cough  #Sinus drainage (unclear if this is an issue)  - start/continue netti pot daily (nurse will show you picture) -  start nasal steroid generic fluticasone inhaler 2 squirts each nostril daily as advised (nurse will do script) - follow with Dr Lazarus Salines who is planning extra sinus tests perhaps on you  #VEry likely Acid Reflux (symptoms are very c/w reflux) - take protonix 40mg  daily on empty stomach (stop prilosec)  -If protonix is expensive, take otc zegerid 20mg   1 capsule daily on empty stomach (nurse will send script)   - take diet sheet from [place] - avoid colas, spices, cheeses, spirits, red meats, beer, chocolates, fried foods etc.,   - sleep with head end of bed elevated  - eat small frequent meals  - do not go to bed for 3 hours after last meal    #Possible Asthma  - stop asmanex today itself - then do methacholine challenge test  In 2-3 weeks (you cannot be pregnant or  have heart disease for this test)   #Cyclical cough (symptoms c/w LPR cough)  - please choose 2-3 days and observe complete voice rest - no talking or whispering  - at all times there  there is urge to cough, drink water or swallow or sip on throat lozenge  #Followup - I will see you in 4 weeks.  - at followup cough score sheet(s) - any problems call or come sooner  06/09/2012 Follow up  She is feeling better but cough is not gone  Last ov with upper airway cough ? Reflux related Changed to protonix .  asmanex stopped  Does feel some better.  Wants to wait on methacholine challenge test.  Is using GERD diet.  No chest pain , weight loss  Or fever.  No recent cxr.     GERD diet.  Increase Protonix 40mg  Twice daily Before meal  Add Chlortrimeton 4mg  2 At bedtime  Delsym 2 tsp Twice daily  Tessalon Three times a day  Voice rest if possible.  Sips of water, sugarless candy. (no mint prodcuts) to help avoid cough and throat clearing.  I will call with xray results.  Please contact office for sooner follow up if symptoms do not improve or worsen or seek emergency care  follow up Dr. Marchelle Gearing in 4-6 weeks or [place]   OV 10/06/2012  Cough improved but not resolved. RSI cough score is 17 and persists.  Cough worst early morning and after GERD'ogenic foods like pizza. She has made sincere effort in cutting gerd type foods like soft drinks, chocolates, fried foods. There is associated clearing of throat (even in office). Cough better rest of day. Cough also worse with laughing etc., but no nocutrnal cough. She is trying water but is struggling with regular water; wants a soft drink bu is finding it brings on cough. She clearly finds GERD relation to cough. Of note, last note from ENT 05/13/12 they are considering CT scan paranasal sinuses but also feel GER dominant element in cough  She is confused about medications. SHe is asking if zegerid is better than protonix. Curerently not on ICS; cough did not get worse when she came off asmanex. In fact cough is beter.  Sinus drainage is better; managing with one chlortrimeton. She feels GERD is the bigger element causing cough. She does not think asthma is causing cough. She is struggling with diet though she has followed with instructiions.   Dr Gretta Cool Reflux Symptom Index (> 13-15 suggestive of LPR cough) 05/03/12 10/06/2012   Hoarseness of problem with voice 4 3  Clearing  Of Throat 5 3  Excess throat mucus or feeling of post nasal drip 4 4  Difficulty swallowing food, liquid or tablets 1 1  Cough after eating or lying down 4 1  Breathing difficulties or choking episodes 4 1  Troublesome or annoying cough 4 3   Sensation of something sticking in throat or lump in throat 1 1  Heartburn, chest pain, indigestion, or stomach acid coming up 1 0  TOTAL 28 17     Kouffman Reflux v Neurogenic Cough Differentiator Reflux 10/06/2012  Comments  Do you awaken from a sound sleep coughing violently?                            With trouble breathing?     Do you have choking episodes when you cannot  Get enough air, gasping for air ?  Yes    Do you usually cough when you lie down into  The bed, or when you just lie down to rest ?                          Yes    Do you usually cough after meals or eating?         Yes yes   Do you cough when (or after) you bend over?    Yes    TOtal 4 1   NEUROGEIC     Do you more-or-less cough all day long? y           y   Does change of temperature make you cough? y y   Does laughing or chuckling cause you to cough? y y   Do fumes (perfume, automobile fumes, burned  Toast, etc.,) cause you to cough ?      y y   Does speaking, singing, or talking on the phone cause you to cough   ?               n n   Total 4 4     Review of Systems  Constitutional: Negative for fever and unexpected weight change.  HENT: Negative for ear pain, nosebleeds, congestion, sore throat, rhinorrhea, sneezing, trouble swallowing, dental problem, postnasal drip and sinus pressure.   Eyes: Negative for redness and itching.  Respiratory: Negative for cough, chest tightness, shortness of breath and wheezing.   Cardiovascular: Negative for palpitations and leg swelling.  Gastrointestinal: Negative for nausea and vomiting.  Genitourinary: Negative for dysuria.  Musculoskeletal: Negative for joint swelling.  Skin: Negative for rash.  Neurological: Negative for headaches.  Hematological: Does not bruise/bleed easily.  Psychiatric/Behavioral: Negative for dysphoric mood. The patient is not nervous/anxious.       Past, Family, Social reviewed: no change since last  visit  Objective:   Physical Exam GEN: A/Ox3; pleasant , NAD, well nourished   HEENT:  [place]/AT,  EACs-clear, TMs-wnl, NOSE-clear, THROAT-clear, no lesions, no postnasal drip or exudate noted.   NECK:  Supple w/ fair ROM; no JVD; normal carotid impulses w/o bruits; no thyromegaly or nodules palpated; no lymphadenopathy.  RESP  Clear  P & A; w/o, wheezes/ rales/ or rhonchi.no accessory muscle use, no dullness to percussion  CARD:  RRR, no m/r/g  , no peripheral edema, pulses intact, no cyanosis or clubbing.  GI:   Soft & nt; nml bowel sounds; no organomegaly or masses detected.  Musco: Warm bil, no deformities or joint swelling noted.   Neuro: alert, no focal deficits noted.    Skin: Warm, no lesions or rashes          Assessment & Plan:

## 2012-10-08 DIAGNOSIS — E8881 Metabolic syndrome: Secondary | ICD-10-CM | POA: Insufficient documentation

## 2012-10-08 NOTE — Assessment & Plan Note (Signed)
Improved 50% . REsidual cough less likely due to cough variant ashma. 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  PLAN Glad  Cough is improved but we need to work hard to get rid of cough completely The fact cough did not worsen when went off asmanex suggests asthma unlikely. Therefore (and due to cost) will hold off methacholine challenge test Continue sinus advice given by me and ENT Dr Lazarus Salines Contniue acid reflux advice - you can change to zegerid but diet is important and outlined below For irritable larynx  - Take gabapentin 300mg  once daily x 3 days, then 300mg  twice daily x 3 days, then 300mg  three times daily to continue. If this makes you too sleepy or drowsy call us and we will cut your medication dosing down REturn in 6 weeks with cough score at followup If cough does not resolve, will consider speech therapy, CT chest and/or methacholine challenge   > 50% of this > 25 min visit spent in face to face counseling (15 min visit converted to 25 min)

## 2012-10-08 NOTE — Assessment & Plan Note (Signed)
I think the gerd diet given in office is very restricitve but is a good test that GERD playing a role in cough. Given her metabolic syndrome I have advised a low glycemic diet and gave detailed instruction handout on what to follow

## 2012-10-17 ENCOUNTER — Telehealth: Payer: Self-pay | Admitting: Internal Medicine

## 2012-10-17 NOTE — Telephone Encounter (Signed)
I spoke with pt and she was stated she was calling to confirm to take the gabapentin directions. I advised her it states 1 tab daily x 3 days, then 1 tab BID x 3 days, then 1 tab TID and stay at that dose. I spoke with pt and she voiced her understanding. She needed nothing further

## 2012-10-30 ENCOUNTER — Telehealth: Payer: Self-pay | Admitting: Internal Medicine

## 2012-10-30 NOTE — Telephone Encounter (Signed)
Spoke with pt She states that since she started taking the gabapentin bid she is very tired and "spaced out" She has noticed that her cough has pretty much resolved though, so does not want to stop med completely Please advise recs thanks!

## 2012-10-31 NOTE — Telephone Encounter (Signed)
lmomtcb x1 

## 2012-10-31 NOTE — Telephone Encounter (Signed)
Pt returned call, advised of MR's recs as stated below.  Pt stated she will take her gabapentin this evening, and then skip her afternoon dose tomorrow.   Pt will call back if her cough increases; otherwise will keep 12.9.13 follow up w/ MR.  Nothing further needed at this time.

## 2012-10-31 NOTE — Telephone Encounter (Signed)
Lower to once a day; take it in evening and see if that helps with tiredness. If not, call and we can reduce dose. MEd should not be stopped abruptly

## 2012-11-06 ENCOUNTER — Encounter: Payer: Self-pay | Admitting: Internal Medicine

## 2012-11-06 ENCOUNTER — Ambulatory Visit (INDEPENDENT_AMBULATORY_CARE_PROVIDER_SITE_OTHER): Payer: BC Managed Care – PPO | Admitting: Internal Medicine

## 2012-11-06 VITALS — BP 140/80 | HR 94 | Temp 98.2°F | Wt 159.0 lb

## 2012-11-06 DIAGNOSIS — R21 Rash and other nonspecific skin eruption: Secondary | ICD-10-CM

## 2012-11-06 DIAGNOSIS — A499 Bacterial infection, unspecified: Secondary | ICD-10-CM

## 2012-11-06 DIAGNOSIS — B9689 Other specified bacterial agents as the cause of diseases classified elsewhere: Secondary | ICD-10-CM

## 2012-11-06 DIAGNOSIS — L089 Local infection of the skin and subcutaneous tissue, unspecified: Secondary | ICD-10-CM

## 2012-11-06 MED ORDER — DOXYCYCLINE HYCLATE 100 MG PO CAPS: 100.0000 mg | ORAL_CAPSULE | Freq: Two times a day (BID) | ORAL | Status: DC

## 2012-11-06 NOTE — Patient Instructions (Addendum)
Will let you know culture results . Can add antibiotic  For staph possible.  Take with plenty of water also.

## 2012-11-06 NOTE — Progress Notes (Signed)
Chief Complaint  Patient presents with  . Rash    Has a rash on belly button.  Also has a place on her nose.    HPI: Patient comes in today for SDA for  new problem evaluation. Had developed a rash near nose left and ongoing with soreness and ocass itching   [facility] who said come to pcp to culture  Occurring About 10 days  For nose rash pustules at times  using triple antibiotic of nose .  Umbilical  Rash noted odor and irritation not a lot of discharge.  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  No current uri sx.   ROS: See pertinent positives and negatives per HPI. rx for poss elr for her cough and poss habit neuro cause of cough now on gabapentin with poss some help .  Continuing.   Past Medical History  Diagnosis Date  . Hyperlipidemia   . Hypertension   . PCO (polycystic ovaries)   . Anal fissure   . Arthritis   . Allergy     History of shots as a child  . Rosacea   . History of infertility, female   . IBS (irritable bowel syndrome)   . Asthma     Reflux induced    Family History  Problem Relation Age of Onset  . Hypertension    . Lung cancer      uncle  . Breast cancer      great aunt    History   Social History  . Marital Status: Married    Spouse Name: N/A    Number of Children: 0  . Years of Education: N/A   Occupational History  . INFANT TEACHER    Social History Main Topics  . Smoking status: Former Smoker -- 0.5 packs/day for 7 years    Types: Cigarettes    Quit date: 12/13/1986  . Smokeless tobacco: None  . Alcohol Use: Yes     Comment: socially  . Drug Use: No  . Sexually Active: None   Other Topics Concern  . 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    Outpatient Encounter Prescriptions as of 11/06/2012  Medication Sig Dispense Refill  .  Chlorpheniramine Maleate (CHLORPHEN MALEATE PO) Take 1 tablet by mouth daily.      . diclofenac (VOLTAREN) 75 MG EC tablet Take 75 mg by mouth as needed.      . diltiazem (CARDIZEM CD) 240 MG 24 hr capsule Take 240 mg by mouth daily.      . fluticasone (FLONASE) 50 MCG/ACT nasal spray Place 2 sprays into the nose daily.  16 g  6  . gabapentin (NEURONTIN) 300 MG capsule 300mg  once daily x 3 days, then 300mg  twice daily x 3 days, then 300mg  three times daily to continue  100 capsule  0  . Lactobacillus (ACIDOPHILUS) 10 MG CAPS Take by mouth daily.       Marland Kitchen levonorgestrel (MIRENA) 20 MCG/24HR IUD 1 each by Intrauterine route once.        . metroNIDAZOLE (METROGEL) 1 % gel Apply topically daily.  60 g  3  . nystatin (MYCOSTATIN) cream Apply topically as needed.       Maxwell Caul Bicarbonate (ZEGERID) 20-1100 MG CAPS Take 1 capsule by mouth daily before  breakfast.      . Zinc Oxide 30.6 % CREA Apply topically as needed.       Marland Kitchen albuterol (VENTOLIN HFA) 108 (90 BASE) MCG/ACT inhaler Inhale into the lungs as needed.       . Boric Acid POWD by Does not apply route as needed.       . doxycycline (VIBRAMYCIN) 100 MG capsule Take 1 capsule (100 mg total) by mouth 2 (two) times daily.  20 capsule  0  . [DISCONTINUED] beclomethasone (QVAR) 80 MCG/ACT inhaler Inhale 2 puffs into the lungs 2 (two) times daily.  1 Inhaler  3  . [DISCONTINUED] pantoprazole (PROTONIX) 40 MG tablet Take 1 tablet by mouth Daily.        EXAM:  BP 140/80  Pulse 94  Temp 98.2 F (36.8 C) (Oral)  Wt 159 lb (72.122 kg)  SpO2 98%  LMP 09/19/2012  There is no height on file to calculate BMI.  GENERAL: vitals reviewed and listed above, alert, oriented, appears well hydrated and in no acute distress  HEENT: atraumatic, conjunctiva  clear,   Nose patent base of nostril mid and left is flat pink red areas without vesicle or pustule or ulcer  Slight crust in nostril left.  Face non tender and no lesion. Swab culture  done NECK: no obvious masses on inspection palpation   LUNGS: clear to auscultation bilaterally, no wheezes, rales or rhonchi, good air movement Skin umbi area with mild redness irritation and no dc or abscess or hernia noted.  CV: HRRR, no clubbing cyanosis or  peripheral edema nl cap refill   MS: moves all extremities without noticeable focal  abnormality  PSYCH: pleasant and cooperative, no obvious depression or anxiety  ASSESSMENT AND PLAN:  Discussed the following assessment and plan:  1. Localized bacterial skin infection  Wound culture   r/o mrsa  cx done    2. Rash periumbilical      -Patient advised to return or notify health care team  immediately if symptoms worsen or persist or new concerns arise.  Patient Instructions  Will let you know culture results . Can add antibiotic  For staph possible.  Take with plenty of water also.    Neta Mends. Soma Bachand M.D.

## 2012-11-09 LAB — WOUND CULTURE
Gram Stain: NONE SEEN
Organism ID, Bacteria: NO GROWTH

## 2012-11-14 ENCOUNTER — Encounter: Payer: Self-pay | Admitting: Family Medicine

## 2012-11-15 ENCOUNTER — Telehealth: Payer: Self-pay | Admitting: Internal Medicine

## 2012-11-15 NOTE — Telephone Encounter (Signed)
The pt was seen in the office on 11/06/12.  She has taken 3 days of doxycycline and has developed a rash on her face that is painful to scratch.  The area of concern on her face has dried up and is no longer flaky and itching.  Looking much better.  The culture did not show any growth.  The pt would like to know if she needs another antibiotic or just to keep an eye on the area beside her nose.  I instructed her not to take any more doxycycline until she hears from our office.

## 2012-11-15 NOTE — Telephone Encounter (Signed)
No oral antibiotic at this time  Observe carefully and keep [place] informed

## 2012-11-16 NOTE — Telephone Encounter (Signed)
Called and left message on personally identified voicemail informing the pt.  Instructed her to call back if any questions and to monitor her condition.  If worsening sx than she should call back and notify us immediately.

## 2012-11-18 ENCOUNTER — Ambulatory Visit (INDEPENDENT_AMBULATORY_CARE_PROVIDER_SITE_OTHER): Payer: BC Managed Care – PPO | Admitting: Family

## 2012-11-18 ENCOUNTER — Encounter: Payer: Self-pay | Admitting: Family

## 2012-11-18 VITALS — BP 124/68 | HR 84 | Temp 98.0°F | Resp 16 | Wt 160.0 lb

## 2012-11-18 DIAGNOSIS — L089 Local infection of the skin and subcutaneous tissue, unspecified: Secondary | ICD-10-CM

## 2012-11-18 DIAGNOSIS — B958 Unspecified staphylococcus as the cause of diseases classified elsewhere: Secondary | ICD-10-CM

## 2012-11-18 DIAGNOSIS — T7840XA Allergy, unspecified, initial encounter: Secondary | ICD-10-CM

## 2012-11-18 DIAGNOSIS — L282 Other prurigo: Secondary | ICD-10-CM

## 2012-11-18 DIAGNOSIS — Z888 Allergy status to other drugs, medicaments and biological substances status: Secondary | ICD-10-CM

## 2012-11-18 MED ORDER — PREDNISONE 20 MG PO TABS: 60.0000 mg | ORAL_TABLET | Freq: Every day | ORAL | Status: AC

## 2012-11-18 NOTE — Progress Notes (Signed)
Subjective:    Patient ID: Gail [name], female    DOB: [birth year], 46 y.o.   MRN: [id number]  HPI 46 year old white female, nonsmoker, patient of Dr. Fabian Sharp is in today with c/o a rash to her face, and torso after being on doxycycline for a staph infection x 3 days. She has since stopped the medication but continues to have hives and itching. The staph infection has cleared. Has also recently started drinking almond milk. Denies any changes in detergents, soaps or lotions.    Review of Systems  Constitutional: Negative.   Respiratory: Negative.  Negative for cough and wheezing.   Cardiovascular: Negative.   Musculoskeletal: Negative.   Skin: Positive for rash.       Rash to the face, torso that is itchy and red.   Hematological: Negative.   Psychiatric/Behavioral: Negative.    Past Medical History  Diagnosis Date  . Hyperlipidemia   . Hypertension   . PCO (polycystic ovaries)   . Anal fissure   . Arthritis   . Allergy     History of shots as a child  . Rosacea   . History of infertility, female   . IBS (irritable bowel syndrome)   . Asthma     Reflux induced    History   Social History  . Marital Status: Married    Spouse Name: N/A    Number of Children: 0  . Years of Education: N/A   Occupational History  . INFANT TEACHER    Social History Main Topics  . Smoking status: Former Smoker -- 0.5 packs/day for 7 years    Types: Cigarettes    Quit date: 12/13/1986  . Smokeless tobacco: Not on file  . Alcohol Use: Yes     Comment: socially  . Drug Use: No  . Sexually Active: Not on file   Other Topics Concern  . 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    Past Surgical History  Procedure Date  . Carpal tunnel release     x2 bilateral  . Wisdom tooth extraction   . Knee arthroscopy     left   .  Wrist tendon release     bilateral thumbs    Family History  Problem Relation Age of Onset  . Hypertension    . Lung cancer      uncle  . Breast cancer      great aunt    Allergies  Allergen Reactions  . Codeine Itching  . Erythromycin Ethylsuccinate     Stomach hurts REACTION: unspecified   can take azithromycin  . Doxycycline Rash  . Latex Rash    Current Outpatient Prescriptions on File Prior to Visit  Medication Sig Dispense Refill  . albuterol (VENTOLIN HFA) 108 (90 BASE) MCG/ACT inhaler Inhale into the lungs as needed.       . Boric Acid POWD by Does not apply route as needed.       . Chlorpheniramine Maleate (CHLORPHEN MALEATE PO) Take 1 tablet by mouth daily.      . diclofenac (VOLTAREN) 75 MG EC tablet Take 75 mg by mouth as needed.      . diltiazem (CARDIZEM CD) 240 MG 24 hr capsule Take 240 mg by mouth daily.      Marland Kitchen doxycycline (VIBRAMYCIN) 100 MG  capsule Take 1 capsule (100 mg total) by mouth 2 (two) times daily.  20 capsule  0  . fluticasone (FLONASE) 50 MCG/ACT nasal spray Place 2 sprays into the nose daily.  16 g  6  . gabapentin (NEURONTIN) 300 MG capsule 300mg  once daily x 3 days, then 300mg  twice daily x 3 days, then 300mg  three times daily to continue  100 capsule  0  . Lactobacillus (ACIDOPHILUS) 10 MG CAPS Take by mouth daily.       Marland Kitchen levonorgestrel (MIRENA) 20 MCG/24HR IUD 1 each by Intrauterine route once.        . metroNIDAZOLE (METROGEL) 1 % gel Apply topically daily.  60 g  3  . nystatin (MYCOSTATIN) cream Apply topically as needed.       Maxwell Caul Bicarbonate (ZEGERID) 20-1100 MG CAPS Take 1 capsule by mouth daily before breakfast.      . Zinc Oxide 30.6 % CREA Apply topically as needed.       . [DISCONTINUED] ranitidine (ZANTAC) 300 MG tablet Take 1 tablet (300 mg total) by mouth 2 (two) times daily.  60 tablet  1    BP 124/68  Pulse 84  Temp 98 F (36.7 C) (Oral)  Resp 16  Wt 160 lb (72.576 kg)  LMP 10/08/2013chart    Objective:    Physical Exam  Constitutional: She is oriented to person, place, and time. She appears well-developed and well-nourished.  Neck: Normal range of motion. Neck supple.  Cardiovascular: Normal rate, regular rhythm and normal heart sounds.   Pulmonary/Chest: Effort normal and breath sounds normal.  Neurological: She is alert and oriented to person, place, and time.  Skin: Skin is warm and dry. There is erythema.       Red, raised, excoriated rash to the face and torso. No drainage or discharge. No signs of infection.   Psychiatric: She has a normal mood and affect.          Assessment & Plan:  Assessment: Allergic Reaction-Medication, Pruritus, staph infection of the skin-resolved  Plan: Prednisone 60mg  po x 5 days. Call the office if symptoms worsen or persist. Recheck as scheduled and sooner as needed.

## 2012-11-20 ENCOUNTER — Ambulatory Visit (INDEPENDENT_AMBULATORY_CARE_PROVIDER_SITE_OTHER): Payer: BC Managed Care – PPO | Admitting: Internal Medicine

## 2012-11-20 ENCOUNTER — Telehealth: Payer: Self-pay | Admitting: Internal Medicine

## 2012-11-20 ENCOUNTER — Encounter: Payer: Self-pay | Admitting: Internal Medicine

## 2012-11-20 VITALS — BP 132/92 | HR 100 | Temp 97.6°F | Ht 60.0 in | Wt 162.8 lb

## 2012-11-20 DIAGNOSIS — R053 Chronic cough: Secondary | ICD-10-CM

## 2012-11-20 DIAGNOSIS — R05 Cough: Secondary | ICD-10-CM

## 2012-11-20 DIAGNOSIS — R059 Cough, unspecified: Secondary | ICD-10-CM

## 2012-11-20 NOTE — Progress Notes (Signed)
Subjective:    Patient ID: Gail [name], female    DOB: [birth year], 46 y.o.   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 Body mass index is 33.08 kg/(m^2).  reports that she quit smoking about 25 years ago. Her smoking use included Cigarettes. She has a 3.5 pack-year smoking history. She does not have any smokeless tobacco history on file.  PCP is Lorretta Harp, MD but also sees Dr Janace Hoard in Urgent care  Referred for chronic cough:    - Says in May 04, 2008 mother in law passed away and then developed acute cough that same day with associated wheeze. Went to UC; cxr showed pneumonia. Recollects being treated with antibiotics and prednisone. Multiple ER visits that week. 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 Since then had persistent cough but tends to get worse when she develops recurrent URI which she is prone to because of her job.  Exposure hx: 05-04-1992 2009/05/04 lived in trailer with lot of black mold (has pictures that confirm the same). Has seen ENT Dr Lazarus Salines one months ago: and told she has severe GE reflux. And started on ppi for first time but relief is only minimal. Has high bp but only on diltiazem (has hx of toprol intake but made coug worse in May 05, 2011). In terms of ge reflux: she feels she does not have it but does admit on occasion at night after full meal will wake up at night with regurgitaton and cough and at other times even after normal meal will cough. In terms of diet: one soda a day but no coffee or tea. Does apply spice to food. Wine < 1 per month. Eats cheese daily. Loves oily food. Cough is present day and night but more so early in day and late in day. Does have associated tickle in throat and clearing of throat. She has hx of high vocal cord usage (sings at church, talks all day). Most recently voice rest and prednisone seemed to help cough and albuterol prn helps cough. Asmanex  since May 04, 2008 not helping. Sometimes weather changes make cough worse.   - RSI cough score 28 and reflect very severe cough and LPR cough  - Cough differentiator she score 4 for GE reflux and 4 for neurogenic cough    REC Cough is from sinus drainage, acid reflux, possibly asthma All of this is working together to cause cyclical cough/LPR cough  #Sinus drainage (unclear if this is an issue)  - start/continue netti pot daily (nurse will show you picture) -  start nasal steroid generic fluticasone inhaler 2 squirts each nostril daily as advised (nurse will do script) - follow with Dr Lazarus Salines who is planning extra sinus tests perhaps on you  #VEry likely Acid Reflux (symptoms are very c/w reflux) - take protonix 40mg  daily on empty stomach (stop prilosec)  -If protonix is expensive, take otc zegerid 20mg   1 capsule daily on empty stomach (nurse will send script)   - take diet sheet from [place] - avoid colas, spices, cheeses, spirits, red meats, beer, chocolates, fried foods etc.,   - sleep with head end of bed elevated  - eat small frequent meals  - do not go to bed for 3 hours after last meal    #Possible Asthma  - stop asmanex today itself - then do methacholine challenge test  In 2-3 weeks (you cannot be pregnant or  have heart disease for this test)   #Cyclical cough (symptoms c/w LPR cough)  - please choose 2-3 days and observe complete voice rest - no talking or whispering  - at all times there  there is urge to cough, drink water or swallow or sip on throat lozenge  #Followup - I will see you in 4 weeks.  - at followup cough score sheet(s) - any problems call or come sooner  06/09/2012 Follow up  She is feeling better but cough is not gone  Last ov with upper airway cough ? Reflux related Changed to protonix .  asmanex stopped  Does feel some better.  Wants to wait on methacholine challenge test.  Is using GERD diet.  No chest pain , weight loss  Or fever.  No recent cxr.     GERD diet.  Increase Protonix 40mg  Twice daily Before meal  Add Chlortrimeton 4mg  2 At bedtime  Delsym 2 tsp Twice daily  Tessalon Three times a day  Voice rest if possible.  Sips of water, sugarless candy. (no mint prodcuts) to help avoid cough and throat clearing.  I will call with xray results.  Please contact office for sooner follow up if symptoms do not improve or worsen or seek emergency care  follow up Dr. Marchelle Gearing in 4-6 weeks or [place]   OV 10/06/2012  Cough improved but not resolved. RSI cough score is 17 and persists.  Cough worst early morning and after GERD'ogenic foods like pizza. She has made sincere effort in cutting gerd type foods like soft drinks, chocolates, fried foods. There is associated clearing of throat (even in office). Cough better rest of day. Cough also worse with laughing etc., but no nocutrnal cough. She is trying water but is struggling with regular water; wants a soft drink bu is finding it brings on cough. She clearly finds GERD relation to cough. Of note, last note from ENT 05/13/12 they are considering CT scan paranasal sinuses but also feel GER dominant element in cough  She is confused about medications. SHe is asking if zegerid is better than protonix. Curerently not on ICS; cough did not get worse when she came off asmanex. In fact cough is beter.  Sinus drainage is better; managing with one chlortrimeton. She feels GERD is the bigger element causing cough. She does not think asthma is causing cough. She is struggling with diet though she has followed with instructiions.  REC Glad Cough is improved but we need to work hard to get rid of cough completely  The fact cough did not worsen when went off asmanex suggests asthma unlikely. Therefore (and due to cost) will hold off methacholine challenge test  Continue sinus advice given by me and ENT Dr Lazarus Salines  Contniue acid reflux advice - you can change to zegerid but diet is important and outlined below   For irritable larynx  - Take gabapentin 300mg  once daily x 3 days, then 300mg  twice daily x 3 days, then 300mg  three times daily to continue. If this makes you too sleepy or drowsy call us and we will cut your medication dosing down  REturn in 6 weeks with cough score at followup  If cough does not resolve, will consider speech therapy, CT chest and/or methacholine challenge    OV 11/20/2012  Cough nearly has resolved. She is happy. Neurontin worked like Pharmacist, community in 2 days but tolerating only one tab at night. She feels relieved.   Clearing  Of Throat 5 3 1  Excess throat mucus or feeling of post nasal drip 4 4 0  Difficulty swallowing food, liquid or tablets 1 1 1   Cough after eating or lying down 4 1 0  Breathing difficulties or choking episodes 4 1 0  Troublesome or annoying cough 4 3 0  Sensation of something sticking in throat or lump in throat 1 1 0  Heartburn, chest pain, indigestion, or stomach acid coming up 1 0 0  TOTAL 28 17 2   date 05/03/12 10/06/12 11/20/2012 Rx with neurontin      Kouffman Reflux v Neurogenic Cough Differentiator Reflux 10/06/2012  11/20/2012   Do you awaken from a sound sleep coughing violently?                            With trouble breathing?   n  Do you have choking episodes when you cannot  Get enough air, gasping for air ?              Yes  n  Do you usually cough when you lie down into  The bed, or when you just lie down to rest ?                          Yes    Do you usually cough after meals or eating?         Yes yes n  Do you cough when (or after) you bend over?    Yes  n  TOtal 4 1 0  NEUROGEIC     Do you more-or-less cough all day long? y           y n  Does change of temperature make you cough? y y n  Does laughing or chuckling cause you to cough? y y Research scientist (medical), wih laundry  Do fumes (perfume, automobile fumes, burned  Toast, etc.,) cause you to cough ?      y y n  Does speaking, singing, or talking on the phone cause you to cough   ?                n n If sings too much  Total 4 4 1       Review of Systems  Constitutional: Negative for fever and unexpected weight change.  HENT: Negative for ear pain, nosebleeds, congestion, sore throat, rhinorrhea, sneezing, trouble swallowing, dental problem, postnasal drip and sinus pressure.   Eyes: Negative for redness and itching.  Respiratory: Negative for cough, chest tightness, shortness of breath and wheezing.   Cardiovascular: Negative for palpitations and leg swelling.  Gastrointestinal: Negative for nausea and vomiting.  Genitourinary: Negative for dysuria.  Musculoskeletal: Negative for joint swelling.  Skin: Negative for rash.  Neurological: Negative for headaches.  Hematological: Does not bruise/bleed easily.  Psychiatric/Behavioral: Negative for dysphoric mood. The patient is not nervous/anxious.        Objective:   Physical Exam GEN: A/Ox3; pleasant , NAD, well nourished   HEENT:  [place]/AT,  EACs-clear, TMs-wnl, NOSE-clear, THROAT-clear, no lesions, no postnasal drip or exudate noted.   NECK:  Supple w/ fair ROM; no JVD; normal carotid impulses w/o bruits; no thyromegaly or nodules palpated; no lymphadenopathy.  RESP  Clear  P & A; w/o, wheezes/ rales/ or rhonchi.no accessory muscle use, no dullness to percussion  CARD:  RRR, no m/r/g  , no peripheral edema, pulses intact, no cyanosis or clubbing.  GI:  Soft & nt; nml bowel sounds; no organomegaly or masses detected.  Musco: Warm bil, no deformities or joint swelling noted.   Neuro: alert, no focal deficits noted.    Skin: Warm, no lesions or rashes          Assessment & Plan:

## 2012-11-20 NOTE — Patient Instructions (Addendum)
Glad cough is better and almost gone Continue neurontin 300mg once at night till mid feb 2013 and then after that  - take it Monday, wed, Friday night x 1 week  - then Monday, Friday night x 1 week  - then Monday alone x 1 week  - then stop REturn to see me early April 2013 or sooner if needed  

## 2012-11-20 NOTE — Telephone Encounter (Signed)
[facility] Triage Call Report Triage Record Num: [id number] Operator: Revonda Humphrey Patient Name: Gail [name] Call Date & Time: 11/18/2012 8:24:34AM Patient Phone: [phone] PCP: Neta Mends. Panosh Patient Gender: Female PCP Fax : [phone] Patient DOB: 1966/10/30 [facility] Name: [facility] Reason for Call: Caller: [name]/Patient; PCP: Berniece Andreas (Family Practice); CB#: [phone]; Call regarding Rash/Hives; On Doxycycline starting on 11/06/2012 for lesion under nose. Hives started on Tuesday 12/3, called office and told to start Benadryl. Spoke with pharmacist and has been taking Claritin. Stopped antibiotic but still hives spreading and very itchy. Now Across cheek, nose under eyes and spots in hair, on abdomen, chest. Tender under eye, keeps rubbing due to itching. Triaged in Hives Guideline - Disposition: See Provider Within 4 Hours due to new onset of hives and after beginning new prescribed medication. Appt today 9:30 am [facility] office. Protocol(s) Used: Hives Recommended Outcome per Protocol: Call Provider within 4 Hours Reason for Outcome: New onset of hives after beginning new prescribed, nonprescribed, or alternative/complementary medication Care Advice: ~

## 2012-11-22 NOTE — Assessment & Plan Note (Signed)
Glad cough is better and almost gone Continue neurontin 300mg  once at night till mid feb 2013 and then after that  - take it Monday, wed, Friday night x 1 week  - then Monday, Friday night x 1 week  - then Monday alone x 1 week  - then stop REturn to see me early April 2013 or sooner if needed

## 2012-11-27 ENCOUNTER — Other Ambulatory Visit: Payer: Self-pay | Admitting: Obstetrics and Gynecology

## 2012-11-27 DIAGNOSIS — Z1231 Encounter for screening mammogram for malignant neoplasm of breast: Secondary | ICD-10-CM

## 2012-12-02 ENCOUNTER — Other Ambulatory Visit: Payer: Self-pay | Admitting: Internal Medicine

## 2012-12-25 ENCOUNTER — Ambulatory Visit: Payer: BC Managed Care – PPO

## 2013-01-01 ENCOUNTER — Ambulatory Visit (INDEPENDENT_AMBULATORY_CARE_PROVIDER_SITE_OTHER): Payer: BC Managed Care – PPO | Admitting: Family Medicine

## 2013-01-01 ENCOUNTER — Other Ambulatory Visit: Payer: Self-pay | Admitting: Internal Medicine

## 2013-01-01 ENCOUNTER — Encounter: Payer: Self-pay | Admitting: Family Medicine

## 2013-01-01 VITALS — BP 124/82 | HR 94 | Temp 98.2°F | Wt 162.0 lb

## 2013-01-01 DIAGNOSIS — J069 Acute upper respiratory infection, unspecified: Secondary | ICD-10-CM

## 2013-01-01 NOTE — Progress Notes (Signed)
Chief Complaint  Patient presents with  . rsv    two children were sent home with this last week; head stopped up, nose running, fatigue     HPI: -started: 3 days ago -symptoms:nasal congestion, sore throat, cough, facial pain over the weekend - but better now "cold like symptoms" -denies:fever, SOB, NVD, tooth pain -has tried: has tried nettie pot once -sick contacts: yes, at work -Hx of: chronic cough - on gabapentin   ROS: See pertinent positives and negatives per HPI.  Past Medical History  Diagnosis Date  . Hyperlipidemia   . Hypertension   . PCO (polycystic ovaries)   . Anal fissure   . Arthritis   . Allergy     History of shots as a child  . Rosacea   . History of infertility, female   . IBS (irritable bowel syndrome)   . Asthma     Reflux induced    Family History  Problem Relation Age of Onset  . Hypertension    . Lung cancer      uncle  . Breast cancer      great aunt    History   Social History  . Marital Status: Married    Spouse Name: N/A    Number of Children: 0  . Years of Education: N/A   Occupational History  . INFANT TEACHER    Social History Main Topics  . Smoking status: Former Smoker -- 0.5 packs/day for 7 years    Types: Cigarettes    Quit date: 12/13/1986  . Smokeless tobacco: None  . Alcohol Use: Yes     Comment: socially  . Drug Use: No  . Sexually Active: None   Other Topics Concern  . 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    Current outpatient prescriptions:albuterol (VENTOLIN HFA) 108 (90 BASE) MCG/ACT inhaler, Inhale into the lungs as needed. , Disp: , Rfl: ;  Boric Acid POWD, by Does not apply route as needed. , Disp: , Rfl: ;  Chlorpheniramine Maleate (CHLORPHEN MALEATE PO), Take 1 tablet by mouth daily., Disp: , Rfl: ;  diltiazem (CARDIZEM CD) 240 MG 24 hr  capsule, Take 240 mg by mouth daily., Disp: , Rfl:  diltiazem (DILACOR XR) 240 MG 24 hr capsule, TAKE 1 CAPSULE BY MOUTH DAILY, Disp: 30 capsule, Rfl: 0;  fluticasone (FLONASE) 50 MCG/ACT nasal spray, USE 2 SPRAYS IN EACH NOSTRIL EVERY DAY, Disp: 16 g, Rfl: 0;  gabapentin (NEURONTIN) 300 MG capsule, Take 300 mg by mouth daily., Disp: , Rfl: ;  Lactobacillus (ACIDOPHILUS) 10 MG CAPS, Take by mouth daily. , Disp: , Rfl:  levonorgestrel (MIRENA) 20 MCG/24HR IUD, 1 each by Intrauterine route once.  , Disp: , Rfl: ;  metroNIDAZOLE (METROGEL) 1 % gel, Apply topically daily., Disp: 60 g, Rfl: 3;  nystatin (MYCOSTATIN) cream, Apply topically as needed. , Disp: , Rfl: ;  Omeprazole-Sodium Bicarbonate (ZEGERID) 20-1100 MG CAPS, Take 1 capsule by mouth daily before breakfast., Disp: , Rfl: ;  Zinc Oxide 30.6 % CREA, Apply topically as needed. , Disp: , Rfl:  [DISCONTINUED] ranitidine (ZANTAC) 300 MG tablet, Take 1 tablet (300 mg total) by mouth 2 (two) times daily., Disp: 60 tablet, Rfl: 1  EXAM:  Filed Vitals:   01/01/13 0928  BP: 124/82  Pulse: 94  Temp: 98.2 F (36.8  C)    There is no height on file to calculate BMI.  GENERAL: vitals reviewed and listed above, alert, oriented, appears well hydrated and in no acute distress  HEENT: atraumatic, conjunttiva clear, no obvious abnormalities on inspection of external nose and ears, normal appearance of ear canals and TMs, clear nasal congestion, mild post oropharyngeal erythema with PND, no tonsillar edema or exudate, no sinus TTP  NECK: no obvious masses on inspection  LUNGS: clear to auscultation bilaterally, no wheezes, rales or rhonchi, good air movement  CV: HRRR, no peripheral edema  MS: moves all extremities without noticeable abnormality  PSYCH: pleasant and cooperative, no obvious depression or anxiety  ASSESSMENT AND PLAN:  Discussed the following assessment and plan:  1. Upper respiratory infection    -likely viral, advised  supportive care, return precuations -Patient advised to return or notify a doctor immediately if symptoms worsen or persist or new concerns arise.  Patient Instructions  INSTRUCTIONS FOR UPPER RESPIRATORY INFECTION:  -plenty of rest and fluids  -nasal saline wash 2-3 times daily (use prepackaged nasal saline or bottled/distilled water if making your own)   -clean nose with nasal saline before using the nasal steroid or sinex  -can use sinex nasal spray for drainage and nasal congestion - but do NOT use longer then 3-4 days  -can use tylenol or ibuprofen as directed for aches and sorethroat  -in the winter time, using a humidifier at night is helpful (please follow cleaning instructions)  -if you are taking a cough medication - use only as directed, may also try a teaspoon of honey to coat the throat and throat lozenges  -for sore throat, salt water gargles can help  -follow up if you have fevers, facial pain, tooth pain, difficulty breathing or are worsening or not getting better in 5-7 days      KIM, HANNAH R.

## 2013-01-01 NOTE — Patient Instructions (Addendum)

## 2013-01-08 ENCOUNTER — Ambulatory Visit
Admission: RE | Admit: 2013-01-08 | Discharge: 2013-01-08 | Disposition: A | Discharge status: Home / Self Care | Payer: BC Managed Care – PPO | Source: Ambulatory Visit | Attending: Obstetrics and Gynecology | Admitting: Obstetrics and Gynecology

## 2013-01-08 DIAGNOSIS — Z1231 Encounter for screening mammogram for malignant neoplasm of breast: Secondary | ICD-10-CM

## 2013-01-18 ENCOUNTER — Telehealth: Payer: Self-pay | Admitting: Internal Medicine

## 2013-01-18 NOTE — Telephone Encounter (Signed)
Patient Instructions   Per ov note 11/20/12  Glad cough is better and almost gone  Continue neurontin 300mg  once at night till mid feb 2013 and then after that  - take it Monday, wed, Friday night x 1 week  - then Monday, Friday night x 1 week  - then Monday alone x 1 week  - then stop  REturn to see me early April 2013 or sooner if needed   -----  I spoke with pt and she stated she only has 5 tabs left of the gabapentin. She takes this once a day. She will not start weaning off until next week. She will need refill. Please advise quantity we can give pt since you are wanting her to wean off MR thanks

## 2013-01-19 MED ORDER — GABAPENTIN 300 MG PO CAPS: 300.0000 mg | ORAL_CAPSULE | Freq: Every day | ORAL | Status: DC

## 2013-01-19 NOTE — Telephone Encounter (Signed)
Spoke with patient, reiterated gabapentin taper.  Verified pharmacy-- [facility] [address]. Gabapentin #16 0 refills sent in and nothing further needed at this time.  Continue neurontin 300mg  once at night till mid feb 2013 and then after that  - take it Monday, wed, Friday night x 1 week  - then Monday, Friday night x 1 week  - then Monday alone x 1 week  - then stop

## 2013-01-19 NOTE — Telephone Encounter (Signed)
lmomtcb---ok to send in #16 of the gabapentin.  Need to find out which pharmacy the pt would like this called to.

## 2013-01-19 NOTE — Telephone Encounter (Signed)
Returning call can be reached at [phone].Gail [name]

## 2013-01-19 NOTE — Telephone Encounter (Signed)
Give daily supply till 01/28/13 Sunday  Then use the tapering schedule to give the dose and send that many gabapentins

## 2013-01-24 ENCOUNTER — Other Ambulatory Visit: Payer: Self-pay | Admitting: Internal Medicine

## 2013-01-24 NOTE — Telephone Encounter (Signed)
This patient has not been seen for this in quite some time.  Please advise.  Thanks!!

## 2013-01-28 NOTE — Telephone Encounter (Signed)
Ok to refill x a year   flonase

## 2013-02-02 ENCOUNTER — Telehealth: Payer: Self-pay | Admitting: Internal Medicine

## 2013-02-02 ENCOUNTER — Encounter: Payer: Self-pay | Admitting: Cardiology

## 2013-02-02 ENCOUNTER — Ambulatory Visit (INDEPENDENT_AMBULATORY_CARE_PROVIDER_SITE_OTHER): Payer: BC Managed Care – PPO | Admitting: Cardiology

## 2013-02-02 VITALS — BP 126/82 | HR 86 | Wt 162.0 lb

## 2013-02-02 DIAGNOSIS — R002 Palpitations: Secondary | ICD-10-CM

## 2013-02-02 MED ORDER — ATENOLOL 25 MG PO TABS: 25.0000 mg | ORAL_TABLET | Freq: Every day | ORAL | Status: DC | PRN

## 2013-02-02 NOTE — Patient Instructions (Addendum)
Your physician recommends that you schedule a follow-up appointment in: AS NEEDED  TAKE ATENOLOL 25 MG ONCE DAILY AS NEEDED FOR PALPITATIONS

## 2013-02-02 NOTE — Assessment & Plan Note (Signed)
Patient continues to have palpitations.previous evaluation showed PVCs. She did not tolerate Toprol. We will try atenolol 25 mg daily when necessary. LV function has been normal previously.

## 2013-02-02 NOTE — Assessment & Plan Note (Signed)
Blood pressure controlled. Continue present medications. 

## 2013-02-02 NOTE — Assessment & Plan Note (Signed)
Management per primary care. 

## 2013-02-02 NOTE — Telephone Encounter (Signed)
Spoke with pt and went over the AVS from last ov re neurontin She verbalized understanding and states nothing further needed She will call back to schedule April appt

## 2013-02-02 NOTE — Progress Notes (Signed)
HPI: Pleasant female I have seen for evaluation of palpitations. TSH, magnesium and potassium normal in July of 2012. Stress echocardiogram in 2007 showed no ischemia and echocardiogram in 2007 showed normal LV function. Holter monitor showed occasional PVC. Laboratories for pheochromocytoma were negative. Repeat echo in July 2012 showed normal LV function. We added toprol but she did not tolerate due to asthma. We therefore changed to Cardizem. I last saw her in September 2012. Since then, she notes some dyspnea on exertion which is unchanged. No orthopnea, PND or exertional chest pain. She is describing worsening palpitations. They are described as a brief flutter and nonsustained. No syncope.  Current Outpatient Prescriptions  Medication Sig Dispense Refill  . Chlorpheniramine Maleate (CHLORPHEN MALEATE PO) Take 1 tablet by mouth as needed.       . diltiazem (CARDIZEM CD) 240 MG 24 hr capsule Take 240 mg by mouth daily.      . fluticasone (FLONASE) 50 MCG/ACT nasal spray INSTILL 2 SPRAYS IN EACH NOSTRIL ONCE DAILY  16 g  11  . gabapentin (NEURONTIN) 300 MG capsule Take 1 capsule (300 mg total) by mouth daily.  16 capsule  0  . Lactobacillus (ACIDOPHILUS) 10 MG CAPS Take by mouth daily.       Marland Kitchen levonorgestrel (MIRENA) 20 MCG/24HR IUD 1 each by Intrauterine route once.        . metroNIDAZOLE (METROGEL) 1 % gel Apply topically daily.  60 g  3  . nystatin (MYCOSTATIN) cream Apply topically as needed.       Maxwell Caul Bicarbonate (ZEGERID) 20-1100 MG CAPS Take 1 capsule by mouth daily before breakfast.      . Zinc Oxide 30.6 % CREA Apply topically as needed.       . [DISCONTINUED] ranitidine (ZANTAC) 300 MG tablet Take 1 tablet (300 mg total) by mouth 2 (two) times daily.  60 tablet  1   No current facility-administered medications for this visit.     Past Medical History  Diagnosis Date  . Hyperlipidemia   . Hypertension   . PCO (polycystic ovaries)   . Anal fissure   .  Arthritis   . Allergy     History of shots as a child  . Rosacea   . History of infertility, female   . IBS (irritable bowel syndrome)   . Asthma     Reflux induced    Past Surgical History  Procedure Laterality Date  . Carpal tunnel release      x2 bilateral  . Wisdom tooth extraction    . Knee arthroscopy      left   . Wrist tendon release      bilateral thumbs    History   Social History  . Marital Status: Married    Spouse Name: N/A    Number of Children: 0  . Years of Education: N/A   Occupational History  . INFANT TEACHER    Social History Main Topics  . Smoking status: Former Smoker -- 0.50 packs/day for 7 years    Types: Cigarettes    Quit date: 12/13/1986  . Smokeless tobacco: Not on file  . Alcohol Use: Yes     Comment: socially  . Drug Use: No  . Sexually Active: Not on file   Other Topics Concern  . 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    ROS: no fevers or chills, productive cough, hemoptysis, dysphasia, odynophagia, melena, hematochezia, dysuria, hematuria, rash, seizure activity, orthopnea, PND, pedal edema, claudication. Remaining systems are negative.  Physical Exam: Well-developed well-nourished in no acute distress.  Skin is warm and dry.  HEENT is normal.  Neck is supple.  Chest is clear to auscultation with normal expansion.  Cardiovascular exam is regular rate and rhythm.  Abdominal exam nontender or distended. No masses palpated. Extremities show no edema. neuro grossly intact  ECG sinus rhythm at a rate of 86. Nonspecific ST changes.

## 2013-02-12 ENCOUNTER — Other Ambulatory Visit: Payer: Self-pay | Admitting: Internal Medicine

## 2013-02-14 ENCOUNTER — Other Ambulatory Visit: Payer: Self-pay | Admitting: *Deleted

## 2013-02-14 MED ORDER — OMEPRAZOLE-SODIUM BICARBONATE 20-1100 MG PO CAPS: 1.0000 | ORAL_CAPSULE | Freq: Every day | ORAL | Status: DC

## 2013-03-13 ENCOUNTER — Telehealth: Payer: Self-pay | Admitting: Internal Medicine

## 2013-03-13 NOTE — Telephone Encounter (Signed)
Yes cough resolved   Dr. Kalman Shan, M.D., [facility].C.P Pulmonary and Critical Care Medicine Staff Physician [facility] Pager: [phone], If no answer or between  15:00h - 7:00h: call [phone]  03/13/2013 10:56 PM

## 2013-03-13 NOTE — Telephone Encounter (Signed)
I spoke with pt. She is wanting to know if she can come off the zegerid. She stated this has been making her nauseated x 2 months and she takes this with 8OZ of water and 30 mins before she eats. She did not take this medication on Monday nor today and was fine. Pt stated her cough has been under control since being off the gabapentin. Pt is aware MR is out of the office. Please advise thanks

## 2013-03-14 NOTE — Telephone Encounter (Signed)
LMTCB x 1 

## 2013-03-14 NOTE — Telephone Encounter (Signed)
Pt aware. Yurika Pereda, CMA  

## 2013-03-23 ENCOUNTER — Encounter: Payer: Self-pay | Admitting: Internal Medicine

## 2013-03-23 ENCOUNTER — Ambulatory Visit (INDEPENDENT_AMBULATORY_CARE_PROVIDER_SITE_OTHER): Payer: BC Managed Care – PPO | Admitting: Internal Medicine

## 2013-03-23 VITALS — BP 130/78 | HR 81 | Temp 98.3°F | Ht 64.0 in | Wt 162.6 lb

## 2013-03-23 DIAGNOSIS — R059 Cough, unspecified: Secondary | ICD-10-CM

## 2013-03-23 DIAGNOSIS — R05 Cough: Secondary | ICD-10-CM

## 2013-03-23 DIAGNOSIS — R053 Chronic cough: Secondary | ICD-10-CM

## 2013-03-23 NOTE — Patient Instructions (Addendum)
-  Glad that cough is completely resolved or near resolved even off Neurontin - continue aggressive sinus control as before - In addition try 3% hypertonic Lloyd Huger med nasal saline spray 2 squirts each nostril at night; this will be a maintenance medication over-the-counter - You can continue to watch cough without acid reflux medication - Treat all acute sinusitis and bronchitis promptly through primary care physician - Return to see me as needed

## 2013-03-23 NOTE — Progress Notes (Signed)
Subjective:    Patient ID: Gail [name], female    DOB: Apr 06, 1966, 47 y.o.   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 Body mass index is 33.08 kg/(m^2).  reports that she quit smoking about 25 years ago. Her smoking use included Cigarettes. She has a 3.5 pack-year smoking history. She does not have any smokeless tobacco history on file.  PCP is Lorretta Harp, MD but also sees Dr Janace Hoard in Urgent care  Referred for chronic cough:    - Says in May 12, 2008 mother in law passed away and then developed acute cough that same day with associated wheeze. Went to UC; cxr showed pneumonia. Recollects being treated with antibiotics and prednisone. Multiple ER visits that week. 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 Since then had persistent cough but tends to get worse when she develops recurrent URI which she is prone to because of her job.  Exposure hx: 05/12/92 12-May-2009 lived in trailer with lot of black mold (has pictures that confirm the same). Has seen ENT Dr Lazarus Salines one months ago: and told she has severe GE reflux. And started on ppi for first time but relief is only minimal. Has high bp but only on diltiazem (has hx of toprol intake but made coug worse in 05-13-11). In terms of ge reflux: she feels she does not have it but does admit on occasion at night after full meal will wake up at night with regurgitaton and cough and at other times even after normal meal will cough. In terms of diet: one soda a day but no coffee or tea. Does apply spice to food. Wine < 1 per month. Eats cheese daily. Loves oily food. Cough is present day and night but more so early in day and late in day. Does have associated tickle in throat and clearing of throat. She has hx of high vocal cord usage (sings at church, talks all day). Most recently voice rest and prednisone seemed to help cough and albuterol prn helps cough. Asmanex since 05/12/08 not  helping. Sometimes weather changes make cough worse.   - RSI cough score 28 and reflect very severe cough and LPR cough  - Cough differentiator she score 4 for GE reflux and 4 for neurogenic cough    REC Cough is from sinus drainage, acid reflux, possibly asthma All of this is working together to cause cyclical cough/LPR cough  #Sinus drainage (unclear if this is an issue)  - start/continue netti pot daily (nurse will show you picture) -  start nasal steroid generic fluticasone inhaler 2 squirts each nostril daily as advised (nurse will do script) - follow with Dr Lazarus Salines who is planning extra sinus tests perhaps on you  #VEry likely Acid Reflux (symptoms are very c/w reflux) - take protonix 40mg  daily on empty stomach (stop prilosec)  -If protonix is expensive, take otc zegerid 20mg   1 capsule daily on empty stomach (nurse will send script)   - take diet sheet from [place] - avoid colas, spices, cheeses, spirits, red meats, beer, chocolates, fried foods etc.,   - sleep with head end of bed elevated  - eat small frequent meals  - do not go to bed for 3 hours after last meal    #Possible Asthma  - stop asmanex today itself - then do methacholine challenge test  In 2-3 weeks (you cannot be pregnant or have heart disease  for this test)   #Cyclical cough (symptoms c/w LPR cough)  - please choose 2-3 days and observe complete voice rest - no talking or whispering  - at all times there  there is urge to cough, drink water or swallow or sip on throat lozenge  #Followup - I will see you in 4 weeks.  - at followup cough score sheet(s) - any problems call or come sooner  06/09/2012 Follow up  She is feeling better but cough is not gone  Last ov with upper airway cough ? Reflux related Changed to protonix .  asmanex stopped  Does feel some better.  Wants to wait on methacholine challenge test.  Is using GERD diet.  No chest pain , weight loss  Or fever.  No recent cxr.    GERD diet.   Increase Protonix 40mg  Twice daily Before meal  Add Chlortrimeton 4mg  2 At bedtime  Delsym 2 tsp Twice daily  Tessalon Three times a day  Voice rest if possible.  Sips of water, sugarless candy. (no mint prodcuts) to help avoid cough and throat clearing.  I will call with xray results.  Please contact office for sooner follow up if symptoms do not improve or worsen or seek emergency care  follow up Dr. Marchelle Gearing in 4-6 weeks or [place]   OV 10/06/2012  Cough improved but not resolved. RSI cough score is 17 and persists.  Cough worst early morning and after GERD'ogenic foods like pizza. She has made sincere effort in cutting gerd type foods like soft drinks, chocolates, fried foods. There is associated clearing of throat (even in office). Cough better rest of day. Cough also worse with laughing etc., but no nocutrnal cough. She is trying water but is struggling with regular water; wants a soft drink bu is finding it brings on cough. She clearly finds GERD relation to cough. Of note, last note from ENT 05/13/12 they are considering CT scan paranasal sinuses but also feel GER dominant element in cough  She is confused about medications. SHe is asking if zegerid is better than protonix. Curerently not on ICS; cough did not get worse when she came off asmanex. In fact cough is beter.  Sinus drainage is better; managing with one chlortrimeton. She feels GERD is the bigger element causing cough. She does not think asthma is causing cough. She is struggling with diet though she has followed with instructiions.  REC Glad Cough is improved but we need to work hard to get rid of cough completely  The fact cough did not worsen when went off asmanex suggests asthma unlikely. Therefore (and due to cost) will hold off methacholine challenge test  Continue sinus advice given by me and ENT Dr Lazarus Salines  Contniue acid reflux advice - you can change to zegerid but diet is important and outlined below  For irritable  larynx  - Take gabapentin 300mg  once daily x 3 days, then 300mg  twice daily x 3 days, then 300mg  three times daily to continue. If this makes you too sleepy or drowsy call us and we will cut your medication dosing down  REturn in 6 weeks with cough score at followup  If cough does not resolve, will consider speech therapy, CT chest and/or methacholine challenge    OV 11/20/2012  Cough nearly has resolved. She is happy. Neurontin worked like Pharmacist, community in 2 days but tolerating only one tab at night. She feels relieved.    REC   Glad cough is better and almost  gone  Continue neurontin 300mg  once at night till mid feb 2014 and then after that  - take it Monday, wed, Friday night x 1 week  - then Monday, Friday night x 1 week  - then Monday alone x 1 week  - then stop  REturn to see me early April 2014 or sooner if needed    OV 03/23/2013  Followup for chronic cough  - She is now off Neurontin since mid February 2014. Cough continues to be under remission. RSI cough score is just to. She has occasional clearing of the throat when she drinks something acidic. She stopped the Zegerid 2 weeks ago because of nausea but has not had a relapse and cough. She continues a sinus control measures with Flonase. She is asking about a saline irrigator that her friend uses. Overall really doing well. No new medical problems other than the fact for the past one day falling sick exposure to kids she's having a flare up of acute rhinitis  Clearing  Of Throat 5 3 1  0  Excess throat mucus or feeling of post nasal drip 4 4 0 1  Difficulty swallowing food, liquid or tablets 1 1 1 1   Cough after eating or lying down 4 1 0 0  Breathing difficulties or choking episodes 4 1 0 0  Troublesome or annoying cough 4 3 0 0  Sensation of something sticking in throat or lump in throat 1 1 0 0  Heartburn, chest pain, indigestion, or stomach acid coming up 1 0 0 0  TOTAL 28 17 2 2   date 05/03/12 10/06/12 11/20/2012 Rx with  neurontin  03/23/2013 Off neurontin     Kouffman Reflux v Neurogenic Cough Differentiator Reflux 10/06/2012  11/20/2012  03/23/2013   Do you awaken from a sound sleep coughing violently?                            With trouble breathing?   n n  Do you have choking episodes when you cannot  Get enough air, gasping for air ?              Yes  n n  Do you usually cough when you lie down into  The bed, or when you just lie down to rest ?                          Yes   n  Do you usually cough after meals or eating?         Yes yes n n  Do you cough when (or after) you bend over?    Yes  n n  TOtal 4 1 0 0  NEUROGEIC      Do you more-or-less cough all day long? y           y n n  Does change of temperature make you cough? y y n n  Does laughing or chuckling cause you to cough? y y Research scientist (medical), wih laundry n  Do fumes (perfume, automobile fumes, burned  Toast, etc.,) cause you to cough ?      y y n n  Does speaking, singing, or talking on the phone cause you to cough   ?               n n If sings too much n  Total 4 4 1  0  Review of Systems  Constitutional: Negative for fever and unexpected weight change.  HENT: Negative for ear pain, nosebleeds, congestion, sore throat, rhinorrhea, sneezing, trouble swallowing, dental problem, postnasal drip and sinus pressure.   Eyes: Negative for redness and itching.  Respiratory: Negative for cough, chest tightness, shortness of breath and wheezing.   Cardiovascular: Negative for palpitations and leg swelling.  Gastrointestinal: Negative for nausea and vomiting.  Genitourinary: Negative for dysuria.  Musculoskeletal: Negative for joint swelling.  Skin: Negative for rash.  Neurological: Negative for headaches.  Hematological: Does not bruise/bleed easily.  Psychiatric/Behavioral: Negative for dysphoric mood. The patient is not nervous/anxious.       Current outpatient prescriptions:atenolol (TENORMIN) 25 MG tablet, Take 1 tablet (25 mg  total) by mouth daily as needed., Disp: 30 tablet, Rfl: 12;  Chlorpheniramine Maleate (CHLORPHEN MALEATE PO), Take 1 tablet by mouth as needed. , Disp: , Rfl: ;  diltiazem (CARDIZEM CD) 240 MG 24 hr capsule, Take 240 mg by mouth daily., Disp: , Rfl: ;  fluticasone (FLONASE) 50 MCG/ACT nasal spray, INSTILL 2 SPRAYS IN EACH NOSTRIL ONCE DAILY, Disp: 16 g, Rfl: 11 Lactobacillus (ACIDOPHILUS) 10 MG CAPS, Take by mouth daily. , Disp: , Rfl: ;  levonorgestrel (MIRENA) 20 MCG/24HR IUD, 1 each by Intrauterine route once.  , Disp: , Rfl: ;  metroNIDAZOLE (METROGEL) 1 % gel, Apply topically daily., Disp: 60 g, Rfl: 3;  nystatin (MYCOSTATIN) cream, Apply topically as needed. , Disp: , Rfl: ;  Zinc Oxide 30.6 % CREA, Apply topically as needed. 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  RESP  Clear  P & A; w/o, wheezes/ rales/ or rhonchi.no accessory muscle use, no dullness to percussion  CARD:  RRR, no m/r/g  , no peripheral edema, pulses intact, no cyanosis or clubbing.  GI:   Soft & nt; nml bowel sounds; no organomegaly or masses detected.  Musco: Warm bil, no deformities or joint swelling noted.   Neuro: alert, no focal deficits noted.    Skin: Warm, no lesions or rashes          Assessment & Plan:

## 2013-03-24 ENCOUNTER — Other Ambulatory Visit: Payer: Self-pay | Admitting: Internal Medicine

## 2013-03-24 NOTE — Assessment & Plan Note (Signed)
-  Glad that cough is completely resolved or near resolved even off Neurontin - continue aggressive sinus control as before - In addition try 3% hypertonic Lloyd Huger med nasal saline spray 2 squirts each nostril at night; this will be a maintenance medication over-the-counter - You can continue to watch cough without acid reflux medication - Treat all acute sinusitis and bronchitis promptly through primary care physician - Return to see me as needed     (> 50% of this 15 min visit spent in face to face counseling)

## 2013-03-28 ENCOUNTER — Other Ambulatory Visit: Payer: Self-pay | Admitting: Cardiology

## 2013-05-09 ENCOUNTER — Other Ambulatory Visit: Payer: Self-pay | Admitting: Family Medicine

## 2013-05-09 ENCOUNTER — Ambulatory Visit: Payer: BC Managed Care – PPO

## 2013-05-09 ENCOUNTER — Ambulatory Visit (INDEPENDENT_AMBULATORY_CARE_PROVIDER_SITE_OTHER): Payer: BC Managed Care – PPO | Admitting: Family Medicine

## 2013-05-09 ENCOUNTER — Ambulatory Visit: Payer: BC Managed Care – PPO | Admitting: Internal Medicine

## 2013-05-09 VITALS — BP 140/85 | HR 75 | Temp 98.0°F | Resp 18 | Ht 60.0 in | Wt 160.4 lb

## 2013-05-09 DIAGNOSIS — R300 Dysuria: Secondary | ICD-10-CM

## 2013-05-09 DIAGNOSIS — R109 Unspecified abdominal pain: Secondary | ICD-10-CM

## 2013-05-09 LAB — POCT URINALYSIS DIPSTICK
Blood, UA: NEGATIVE
Ketones, UA: NEGATIVE
Protein, UA: NEGATIVE
Spec Grav, UA: 1.02
Urobilinogen, UA: 0.2
pH, UA: 8

## 2013-05-09 LAB — POCT CBC
Granulocyte percent: 66.4 %G (ref 37–80)
MCH, POC: 31.2 pg (ref 27–31.2)
MID (cbc): 0.4 (ref 0–0.9)
MPV: 10.5 fL (ref 0–99.8)
POC Granulocyte: 3.5 (ref 2–6.9)
POC MID %: 7.8 %M (ref 0–12)
Platelet Count, POC: 211 10*3/uL (ref 142–424)
RBC: 4.39 M/uL (ref 4.04–5.48)

## 2013-05-09 LAB — COMPREHENSIVE METABOLIC PANEL
AST: 17 U/L (ref 0–37)
Albumin: 4.4 g/dL (ref 3.5–5.2)
Alkaline Phosphatase: 82 U/L (ref 39–117)
BUN: 13 mg/dL (ref 6–23)
Potassium: 4.1 mEq/L (ref 3.5–5.3)

## 2013-05-09 LAB — POCT UA - MICROSCOPIC ONLY
Bacteria, U Microscopic: NEGATIVE
Casts, Ur, LPF, POC: NEGATIVE
Crystals, Ur, HPF, POC: NEGATIVE

## 2013-05-09 LAB — POCT WET PREP WITH KOH
Clue Cells Wet Prep HPF POC: NEGATIVE
RBC Wet Prep HPF POC: NEGATIVE
Trichomonas, UA: NEGATIVE

## 2013-05-09 LAB — POCT URINE PREGNANCY: Preg Test, Ur: NEGATIVE

## 2013-05-09 NOTE — Patient Instructions (Addendum)
If you have any more or persistent pain over the next day or so please let me know. Try a stool softener for your bowels.  If you are still hurting tomorrow please come back and see me- call sooner if you get worse, or go to the ED. Drink plenty of fluids.  I will be in touch with the rest of your labs when they come in.

## 2013-05-09 NOTE — Progress Notes (Addendum)
[facility] [address], [place] [address] [phone] [phone]  Date:  05/09/2013   Name:  Gail [name]   DOB:  April 11, 1966   MRN:  [id number]  PCP:  Flo Shanks, MD    Chief Complaint: Abdominal Pain and Dysuria   History of Present Illness:  Gail [name] is a 47 y.o. very pleasant female patient who presents with the following:  Yesterday she did not have a BM like she typically would.  She was very busy at work and did not have time to use the restroom.  She then had a large BM, and noted onset of abdominal pain right after the BM.  She rested and laid down, walked around.  She felt better towards the evening.   This am when she went to urinate she noted some pain when she urinated.  When she pushed to pass urine and stool she had some pain in her lower abdomen.    She does not note any dysuria, no hematuria. No blood seen in her stool.  She does have IBS, but lately her symptoms have been pretty quiet.   No vomiting, but she has noted some mild nausea Also some back pain but she has moved some furniture lately.   No vaginal discharge, but she has noted a mild odor.  She wonders if she might have BV.    She is feeling a good bit better now, but would like for [place] to check her out to be sure all is ok  Patient Active Problem List   Diagnosis Date Noted  . Metabolic syndrome 10/08/2012  . Carbuncle 09/29/2012  . Rosacea 09/29/2012  . Chronic cough 05/03/2012  . Hearing difficulty 02/25/2012  . Eustachian tube dysfunction 02/25/2012  . Heart palpitations 07/07/2011  . Neck pain 04/01/2011  . Back pain 04/01/2011  . OBESITY 01/05/2011  . ASTHMA 12/29/2009  . HYPERGLYCEMIA, BORDERLINE 11/25/2009  . PALPITATIONS, RECURRENT 09/23/2009  . TOBACCO USE, QUIT 09/23/2009  . ARTHRITIS 04/30/2008  . POLYCYSTIC OVARIES 10/16/2007  . FEMALE INFERTILITY 10/16/2007  . HYPERLIPIDEMIA 07/18/2007  . HYPERTENSION 07/18/2007    Past Medical History  Diagnosis  Date  . Hyperlipidemia   . Hypertension   . PCO (polycystic ovaries)   . Anal fissure   . Arthritis   . Allergy     History of shots as a child  . Rosacea   . History of infertility, female   . IBS (irritable bowel syndrome)   . Asthma     Reflux induced    Past Surgical History  Procedure Laterality Date  . Carpal tunnel release      x2 bilateral  . Wisdom tooth extraction    . Knee arthroscopy      left   . Wrist tendon release      bilateral thumbs    History  Substance Use Topics  . Smoking status: Former Smoker -- 0.50 packs/day for 7 years    Types: Cigarettes    Quit date: 12/13/1986  . Smokeless tobacco: Not on file  . Alcohol Use: Yes     Comment: socially    Family History  Problem Relation Age of Onset  . Hypertension    . Lung cancer      uncle  . Breast cancer      great aunt    Allergies  Allergen Reactions  . Codeine Itching  . Erythromycin Ethylsuccinate     Stomach hurts REACTION: unspecified  can take azithromycin  . Doxycycline Rash  . Latex Rash    Medication list has been reviewed and updated.  Current Outpatient Prescriptions on File Prior to Visit  Medication Sig Dispense Refill  . diltiazem (DILACOR XR) 240 MG 24 hr capsule TAKE 1 CAPSULE BY MOUTH DAILY  30 capsule  9  . fluticasone (FLONASE) 50 MCG/ACT nasal spray INSTILL 2 SPRAYS IN EACH NOSTRIL ONCE DAILY  16 g  11  . Lactobacillus (ACIDOPHILUS) 10 MG CAPS Take by mouth daily.       Marland Kitchen levonorgestrel (MIRENA) 20 MCG/24HR IUD 1 each by Intrauterine route once.        . nystatin (MYCOSTATIN) cream Apply topically as needed.       . Zinc Oxide 30.6 % CREA Apply topically as needed.       Marland Kitchen atenolol (TENORMIN) 25 MG tablet Take 1 tablet (25 mg total) by mouth daily as needed.  30 tablet  12  . Chlorpheniramine Maleate (CHLORPHEN MALEATE PO) Take 1 tablet by mouth as needed.       . diltiazem (CARDIZEM CD) 240 MG 24 hr capsule Take 240 mg by mouth daily.      . metroNIDAZOLE  (METROGEL) 1 % gel Apply topically daily.  60 g  3  . Omeprazole-Sodium Bicarbonate (ZEGERID) 20-1100 MG CAPS Take 1 capsule by mouth daily before breakfast.  30 each  5  . [DISCONTINUED] ranitidine (ZANTAC) 300 MG tablet Take 1 tablet (300 mg total) by mouth 2 (two) times daily.  60 tablet  1   No current facility-administered medications on file prior to visit.    Review of Systems:  As per HPI- otherwise negative.   Physical Examination: Filed Vitals:   05/09/13 1119  BP: 144/82  Pulse: 98  Temp: 98 F (36.7 C)  Resp: 18   Filed Vitals:   05/09/13 1119  Height: 5' (1.524 m)  Weight: 160 lb 6.4 oz (72.757 kg)   Body mass index is 31.33 kg/(m^2). Ideal Body Weight: Weight in (lb) to have BMI = 25: 127.7  GEN: WDWN, NAD, Non-toxic, A & O x 3, overweight HEENT: Atraumatic, Normocephalic. Neck supple. No masses, No LAD. Ears and Nose: No external deformity. CV: RRR, No M/G/R. No JVD. No thrill. No extra heart sounds. PULM: CTA B, no wheezes, crackles, rhonchi. No retractions. No resp. distress. No accessory muscle use. ABD: S,  ND, +BS. No rebound. No HSM.  Mild epigastric tenderness to palpation. No other pain to palpation EXTR: No c/c/e NEURO Normal gait.  PSYCH: Normally interactive. Conversant. Not depressed or anxious appearing.  Calm demeanor.  Pelvic: normal exam, mirena strings are in place, no CMT, no adnexal tenderness or masses   Results for orders placed in visit on 05/09/13  POCT UA - MICROSCOPIC ONLY      Result Value Range   WBC, Ur, HPF, POC 0-4     RBC, urine, microscopic 0-2     Bacteria, U Microscopic neg     Mucus, UA trace     Epithelial cells, urine per micros 2-6     Crystals, Ur, HPF, POC neg     Casts, Ur, LPF, POC neg     Yeast, UA neg    POCT URINALYSIS DIPSTICK      Result Value Range   Color, UA yellow     Clarity, UA clear     Glucose, UA neg     Bilirubin, UA neg     Ketones, UA neg  Spec Grav, UA 1.020     Blood, UA neg      pH, UA 8.0     Protein, UA neg     Urobilinogen, UA 0.2     Nitrite, UA neg     Leukocytes, UA Negative    POCT CBC      Result Value Range   WBC 5.2  4.6 - 10.2 K/uL   Lymph, poc 1.3  0.6 - 3.4   POC LYMPH PERCENT 25.8  10 - 50 %L   MID (cbc) 0.4  0 - 0.9   POC MID % 7.8  0 - 12 %M   POC Granulocyte 3.5  2 - 6.9   Granulocyte percent 66.4  37 - 80 %G   RBC 4.39  4.04 - 5.48 M/uL   Hemoglobin 13.7  12.2 - 16.2 g/dL   HCT, POC 40.9  81.1 - 47.9 %   MCV 98.1 (*) 80 - 97 fL   MCH, POC 31.2  27 - 31.2 pg   MCHC 31.8  31.8 - 35.4 g/dL   RDW, POC 91.4     Platelet Count, POC 211  142 - 424 K/uL   MPV 10.5  0 - 99.8 fL  POCT URINE PREGNANCY      Result Value Range   Preg Test, Ur Negative    POCT WET PREP WITH KOH      Result Value Range   Trichomonas, UA Negative     Clue Cells Wet Prep HPF POC neg     Epithelial Wet Prep HPF POC 4-6     Yeast Wet Prep HPF POC neg     Bacteria Wet Prep HPF POC small     RBC Wet Prep HPF POC neg     WBC Wet Prep HPF POC 8-12     KOH Prep POC Negative     [facility] reading (PRIMARY) by  Dr. Patsy Lager. Abdominal series: negative bowel gas pattern, stool throughout colon.   *RADIOLOGY REPORT*  Clinical Data: Low abdominal pain, some nausea  ACUTE ABDOMEN SERIES (ABDOMEN 2 VIEW & CHEST 1 VIEW)  Comparison: Chest x-ray of 06/12/2012  Findings: No active infiltrate or effusion is seen. Mediastinal contours appear normal. The heart is within normal limits in size.  Supine and erect views the abdomen show no bowel obstruction. No free air is seen. An IUD is noted within the region of the uterus. Mild lumbar scoliosis convex to the right is noted with associated degenerative change.  IMPRESSION:  1. No active lung disease. 2. No bowel obstruction. No free air. 3. IUD overlies the mid pelvis. 4. Mild lumbar scoliosis.   Assessment and Plan: Dysuria - Plan: POCT UA - Microscopic Only, POCT urinalysis dipstick, POCT urine pregnancy, Urine  culture  Abdominal  pain, other specified site - Plan: POCT CBC, POCT Wet Prep with KOH, Comprehensive metabolic panel, CANCELED: DG Abd 2 Views  Possible UTI, but urine is not convincing.  Await urine culture- she was more concerned about abdominal pain and does not have UTI symptoms so will defer tx for culture.  CBC is reassuring.  At this time her exam is benign, with only mild epigastric tenderness.  She will follow- up with me if her symptoms return.    Follow- up lumbar scoliosis/ degenerative change with the rest of her labs.    Signed Abbe Amsterdam, MD  05/14/13- she called to let us know she has received her urine culture result.  Also will ask her  to recheck her UA and micro in a couple of weeks- hand wrote this information on her letter..  Mailed a copy of her abdominal series x-ray, with notes regarding scoliosis.

## 2013-05-10 ENCOUNTER — Encounter: Payer: Self-pay | Admitting: Family Medicine

## 2013-05-11 MED ORDER — CEPHALEXIN 500 MG PO CAPS: 500.0000 mg | ORAL_CAPSULE | Freq: Two times a day (BID) | ORAL | Status: DC

## 2013-05-11 NOTE — Addendum Note (Signed)
Addended by: Abbe Amsterdam C on: 05/11/2013 06:53 AM   Modules accepted: Orders

## 2013-05-12 ENCOUNTER — Telehealth: Payer: Self-pay | Admitting: Family Medicine

## 2013-05-12 LAB — URINE CULTURE: Colony Count: 35000

## 2013-05-12 NOTE — Telephone Encounter (Signed)
Called again today to be sure she received my message from yesterday regarding her UTI, but no answer

## 2013-05-14 ENCOUNTER — Telehealth: Payer: Self-pay | Admitting: Family Medicine

## 2013-05-14 ENCOUNTER — Encounter: Payer: Self-pay | Admitting: Family Medicine

## 2013-05-14 NOTE — Telephone Encounter (Signed)
No answer

## 2013-05-14 NOTE — Telephone Encounter (Signed)
Pt CB and reported that she did get Dr Cyndie Chime message and has started on Abx.

## 2013-05-14 NOTE — Addendum Note (Signed)
Addended by: Abbe Amsterdam C on: 05/14/2013 03:25 PM   Modules accepted: Orders

## 2013-06-03 ENCOUNTER — Telehealth: Payer: Self-pay

## 2013-06-03 NOTE — Telephone Encounter (Signed)
Patient would like to know if we took xray of tailbone last time she was here. She says she injured a few years ago and is having some pain and discomfort there and is wondering if that is part of the issues she is having.

## 2013-06-04 NOTE — Telephone Encounter (Signed)
No but she can come in for this if she would like. Called her to advise

## 2013-06-11 ENCOUNTER — Ambulatory Visit (INDEPENDENT_AMBULATORY_CARE_PROVIDER_SITE_OTHER): Payer: BC Managed Care – PPO | Admitting: Family Medicine

## 2013-06-11 ENCOUNTER — Ambulatory Visit: Payer: BC Managed Care – PPO

## 2013-06-11 VITALS — BP 160/80 | HR 92 | Temp 98.7°F | Resp 20 | Ht 59.75 in | Wt 163.2 lb

## 2013-06-11 DIAGNOSIS — M533 Sacrococcygeal disorders, not elsewhere classified: Secondary | ICD-10-CM

## 2013-06-11 DIAGNOSIS — R300 Dysuria: Secondary | ICD-10-CM

## 2013-06-11 LAB — POCT URINALYSIS DIPSTICK
Bilirubin, UA: NEGATIVE
Blood, UA: NEGATIVE
Glucose, UA: NEGATIVE
Ketones, UA: NEGATIVE
Leukocytes, UA: NEGATIVE
Nitrite, UA: NEGATIVE
Protein, UA: NEGATIVE
Spec Grav, UA: 1.02
Urobilinogen, UA: 0.2
pH, UA: 7

## 2013-06-11 LAB — IFOBT (OCCULT BLOOD): IFOBT: NEGATIVE

## 2013-06-11 LAB — POCT UA - MICROSCOPIC ONLY
Casts, Ur, LPF, POC: NEGATIVE
Crystals, Ur, HPF, POC: NEGATIVE
Mucus, UA: NEGATIVE
RBC, urine, microscopic: NEGATIVE
WBC, Ur, HPF, POC: NEGATIVE
Yeast, UA: NEGATIVE

## 2013-06-11 NOTE — Progress Notes (Signed)
Subjective: Patient has had a couple of episodes where she is injured her coccyx. The last time was when she fell backwards earlier this year against a table leg, hitting her tailbone right on it. She hurts. She is continued to hurt since then. She was concerned she might have a fracture there. 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 Sometimes when she strains and hard stools it hurts her. Has not passed any blood per. She does have a history of irritable bowel. She has had a colonoscopy in the past. She's had a little bit of dysuria recently.  Objective: Abdomen has normal bowel sounds, soft with some mild generalized tenderness. No CVA tenderness. She is tender over her low sacrum and coccyx region. Digital exam was done and she is quite tender along the coccyx.  [facility] reading (PRIMARY) by  Dr. Alwyn Ren Mildly abnormal on lateral view.  ? Old fracture.  However all looks in line..Await radiology reading.  Results for orders placed in visit on 06/11/13  POCT UA - MICROSCOPIC ONLY      Result Value Range   WBC, Ur, HPF, POC neg     RBC, urine, microscopic neg     Bacteria, U Microscopic trace     Mucus, UA neg     Epithelial cells, urine per micros 0-3     Crystals, Ur, HPF, POC neg     Casts, Ur, LPF, POC neg     Yeast, UA neg    POCT URINALYSIS DIPSTICK      Result Value Range   Color, UA yellow     Clarity, UA clear     Glucose, UA neg     Bilirubin, UA neg     Ketones, UA neg     Spec Grav, UA 1.020     Blood, UA neg     pH, UA 7.0     Protein, UA neg     Urobilinogen, UA 0.2     Nitrite, UA neg     Leukocytes, UA Negative    IFOBT (OCCULT BLOOD)      Result Value Range   IFOBT Negative      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 I'll wait and see what the radiologist says. Advised to take a nonsteroidal, but she has a history of some  difficulty with them. She will try Aleve twice a day. Also advise using a stool softener and some MiraLax. Return if worse.   Wrote a prescription for a donut cushion.

## 2013-06-11 NOTE — Patient Instructions (Addendum)
Try taking Aleve twice daily if you can tolerate it.  Get a donut cushion to use  Return if worse  Tailbone Injury The tailbone (coccyx) is the small bone at the lower end of the spine. A tailbone injury may involve stretched ligaments, bruising, or a broken bone (fracture). Women are more vulnerable to this injury due to having a wider pelvis. CAUSES  This type of injury typically occurs from falling and landing on the tailbone. Repeated strain or friction from actions such as rowing and bicycling may also injure the area. The tailbone can be injured during childbirth. Infections or tumors may also press on the tailbone and cause pain. Sometimes, the cause of injury is unknown. SYMPTOMS   Bruising.  Pain when sitting.  Painful bowel movements.  In women, pain during intercourse. DIAGNOSIS  Your caregiver can diagnose a tailbone injury based on your symptoms and a physical exam. X-rays may be taken if a fracture is suspected. Your caregiver may also use an MRI scan imaging test to evaluate your symptoms. TREATMENT  Your caregiver may prescribe medicines to help relieve your pain. Most tailbone injuries heal on their own in 4 to 6 weeks. However, if the injury is caused by an infection or tumor, the recovery period may vary. PREVENTION  Wear appropriate padding and sports gear when bicycling and rowing. This can help prevent an injury from repeated strain or friction. HOME CARE INSTRUCTIONS   Put ice on the injured area.  Put ice in a plastic bag.  Place a towel between your skin and the bag.  Leave the ice on for 15-20 minutes, every hour while awake for the first 1 to 2 days.  Sit on a large, rubber or inflated ring or cushion to ease your pain. Lean forward when sitting to help decrease discomfort.  Avoid sitting for long periods of time.  Increase your activity as the pain allows.  Only take over-the-counter or prescription medicines for pain, discomfort, or fever as  directed by your caregiver.  You may use stool softeners if it is painful to have a bowel movement, or as directed by your caregiver.  Eat a diet with plenty of fiber to help prevent constipation.  Keep all follow-up appointments as directed by your caregiver. SEEK MEDICAL CARE IF:   Your pain becomes worse.  Your bowel movements cause a great deal of discomfort.  You are unable to have a bowel movement.  You have a fever. MAKE SURE YOU:  Understand these instructions.  Will watch your condition.  Will get help right away if you are not doing well or get worse. Document Released: 11/26/2000 Document Revised: 02/21/2012 Document Reviewed: 06/24/2011 [facility] Patient Information 2014 [place], [place].

## 2013-06-29 ENCOUNTER — Other Ambulatory Visit: Payer: Self-pay | Admitting: Internal Medicine

## 2013-07-04 ENCOUNTER — Telehealth: Payer: Self-pay | Admitting: Internal Medicine

## 2013-07-04 NOTE — Telephone Encounter (Signed)
Can I sch appt for headaches and bp on this Friday please?

## 2013-07-04 NOTE — Telephone Encounter (Signed)
She will need to come in and see WP.  Has only been seen for acute problems for over 1 year.  Needs to see WP.  Please make appt with the pt.  Thanks!!!

## 2013-07-04 NOTE — Telephone Encounter (Signed)
Pt is requesting this Friday after around 3pm. Pt would like to have bp check. Pt is having headaches on and off since 06-29-13

## 2013-07-05 NOTE — Telephone Encounter (Signed)
Left message on pt cell appt 3 pm tomorrow

## 2013-07-05 NOTE — Telephone Encounter (Signed)
Appt has been confirm

## 2013-07-05 NOTE — Telephone Encounter (Signed)
I placed her on the schedule for 07/06/13 2 3pm.  Please notify the patient.

## 2013-07-06 ENCOUNTER — Encounter: Payer: Self-pay | Admitting: Internal Medicine

## 2013-07-06 ENCOUNTER — Ambulatory Visit (INDEPENDENT_AMBULATORY_CARE_PROVIDER_SITE_OTHER): Payer: BC Managed Care – PPO | Admitting: Internal Medicine

## 2013-07-06 VITALS — BP 150/90 | HR 92 | Temp 98.6°F | Wt 166.0 lb

## 2013-07-06 DIAGNOSIS — I10 Essential (primary) hypertension: Secondary | ICD-10-CM

## 2013-07-06 DIAGNOSIS — R519 Headache, unspecified: Secondary | ICD-10-CM | POA: Insufficient documentation

## 2013-07-06 DIAGNOSIS — F458 Other somatoform disorders: Secondary | ICD-10-CM

## 2013-07-06 DIAGNOSIS — R51 Headache: Secondary | ICD-10-CM

## 2013-07-06 MED ORDER — DILTIAZEM HCL ER 180 MG PO CP24: 360.0000 mg | ORAL_CAPSULE | Freq: Every day | ORAL | Status: DC

## 2013-07-06 MED ORDER — CYCLOBENZAPRINE HCL 5 MG PO TABS: ORAL_TABLET | ORAL | Status: DC

## 2013-07-06 NOTE — Patient Instructions (Addendum)
Check readings  And if elevated  Can change dose to diltiazem  360  Highest dose. Consider seeing dentist  About  Teeth gri and antitting.   The pain sound like a neuritis.   And antiinflammatories can help   But can interefer with blood pressure  Control.  If   Needed we can get neurology appt  If ongoing .  Consider y oga for muscle  tension . Can try 5 mg flexeril 1-2 at night  While having the headache.

## 2013-07-06 NOTE — Progress Notes (Signed)
Chief Complaint  Patient presents with  . Headache    Has had a headache since Sunday of last week.  Also needed to follow up  on her BP.    HPI: Patient comes in today for SDA for  new problem evaluation. HA     ocass nerve pain  behind left ear. But increased recently.   But has had  4-45 fdays  Of reg has and  Last weekend was worse   Sharp pains off and on .   Scalp  a little sore.     Not as bad itoday  But last night intense.  Seen recently last month in  urgen care  BP 150/90 range .   Had uti and coccyx pain  Bp concern Only missed a few days a while back of bp meds  Off b blocker and helped cough .  Dentist says teeth grinding.  Could do this at night bite guard discussed in  Past   Some gum chewing.   ocass advil.  ROS: See pertinent positives and negatives per HPI. Graduated [employer] may 14   Past Medical History  Diagnosis Date  . Hyperlipidemia   . Hypertension   . PCO (polycystic ovaries)   . Anal fissure   . Arthritis   . Allergy     History of shots as a child  . Rosacea   . History of infertility, female   . IBS (irritable bowel syndrome)     Family History  Problem Relation Age of Onset  . Hypertension    . Lung cancer      uncle  . Breast cancer      great aunt  . Hypertension Mother   . Heart disease Mother   . Hypertension Father   . Dementia Maternal Grandfather   . Heart disease Paternal Grandmother   . Dementia Paternal Grandfather     History   Social History  . Marital Status: Married    Spouse Name: N/A    Number of Children: 0  . Years of Education: N/A   Occupational History  . INFANT TEACHER    Social History Main Topics  . Smoking status: Former Smoker -- 0.50 packs/day for 7 years    Types: Cigarettes    Quit date: 12/13/1986  . Smokeless tobacco: None  . Alcohol Use: 0.0 oz/week    0 Glasses of wine per week     Comment: socially  . Drug Use: No  . Sexually Active: None     Comment: married   Other Topics Concern  .  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    Outpatient Encounter Prescriptions as of 07/06/2013  Medication Sig Dispense Refill  . diltiazem (DILACOR XR) 180 MG 24 hr capsule Take 2 capsules (360 mg total) by mouth daily.  60 capsule  0  . fluticasone (FLONASE) 50 MCG/ACT nasal spray 2 sprays per nostril daily.  MUST HAVE OFFICE VISIT FOR FURTHER REFILLS.  16 g  0  . Lactobacillus (ACIDOPHILUS) 10 MG CAPS Take by mouth daily.       Marland Kitchen levonorgestrel (MIRENA) 20 MCG/24HR IUD 1 each by Intrauterine route once.        . metroNIDAZOLE (METROGEL)  1 % gel Apply topically daily. Prn      . nystatin (MYCOSTATIN) cream Apply topically as needed.       . Zinc Oxide 30.6 % CREA Apply topically as needed.       . [DISCONTINUED] diltiazem (DILACOR XR) 240 MG 24 hr capsule TAKE 1 CAPSULE BY MOUTH DAILY  30 capsule  9  . cyclobenzaprine (FLEXERIL) 5 MG tablet Take 1-2 at night as needed.  30 tablet  0  . [DISCONTINUED] atenolol (TENORMIN) 25 MG tablet Take 1 tablet (25 mg total) by mouth daily as needed.  30 tablet  12  . [DISCONTINUED] cephALEXin (KEFLEX) 500 MG capsule Take 1 capsule (500 mg total) by mouth 2 (two) times daily.  14 capsule  0  . [DISCONTINUED] Chlorpheniramine Maleate (CHLORPHEN MALEATE PO) Take 1 tablet by mouth as needed.       . [DISCONTINUED] diltiazem (CARDIZEM CD) 240 MG 24 hr capsule Take 240 mg by mouth daily.      . [DISCONTINUED] Omeprazole-Sodium Bicarbonate (ZEGERID) 20-1100 MG CAPS Take 1 capsule by mouth daily before breakfast.  30 each  5   No facility-administered encounter medications on file as of 07/06/2013.    EXAM:  BP 150/90  Pulse 92  Temp(Src) 98.6 F (37 C) (Oral)  Wt 166 lb (75.297 kg)  BMI 32.68 kg/m2  SpO2 98%  Body mass index is 32.68 kg/(m^2).  GENERAL: vitals reviewed and  listed above, alert, oriented, appears well hydrated and in no acute distress HEENT: atraumatic, conjunctiva  clear, no obvious abnormalities on inspection of external nose and ears OP : no lesion edema or exudate  NECK: no obvious masses on inspection palpation mild tenderness angle of left jaw points to behind left ear. LUNGS: clear to auscultation bilaterally, no wheezes, rales or rhonchi, good air movement CV: HRRR, no clubbing cyanosis or  peripheral edema nl cap refill  Abdomen soft without organomegaly guarding or rebound neurologic intact Cranial nerves III through XII appear intact DTRs present motor strength normal no obvious focal abnormalities MS: moves all extremities without noticeable focal  abnormality PSYCH: pleasant and cooperative, no obvious depression or anxiety Lab Results  Component Value Date   WBC 5.2 05/09/2013   HGB 13.7 05/09/2013   HCT 43.1 05/09/2013   PLT 207 11/12/2011   GLUCOSE 89 05/09/2013   CHOL 205* 12/10/2010   TRIG 68.0 12/10/2010   HDL 40.40 12/10/2010   LDLDIRECT 160.7 12/10/2010   LDLCALC 136* 11/18/2008   ALT 14 05/09/2013   AST 17 05/09/2013   NA 134* 05/09/2013   K 4.1 05/09/2013   CL 101 05/09/2013   CREATININE 0.73 05/09/2013   BUN 13 05/09/2013   CO2 25 05/09/2013   TSH 1.20 07/06/2011   HGBA1C 6.1 07/06/2011    ASSESSMENT AND PLAN:  Discussed the following assessment and plan:  Head pain left    HYPERTENSION  Teeth grinding Consider bruxism is aggravator of pain that sounds like a neuritis if in the neck jaw. Don't think is related to blood pressure or vascular system. However blood pressure is a bit up today she is only on the plain diltiazem instead of beta blocker which seemed to have aggravated coughing.  At this time short-term low-dose muscle relaxant at night see her dentist consider bite guard into inflammatory is with caution we can alter her hypertension medicine and the next step would be adding another medicine could try a  maximum dose of diltiazem360  her pulse is elevated enough  I don't think she would block down.   And followup preventive visit in one to 2 months anyway. Blood pressure readings come down and would not have to adjust her medicine -Patient advised to return or notify health care team  if symptoms worsen or persist or new concerns arise.  Patient Instructions  Check readings  And if elevated  Can change dose to diltiazem  360  Highest dose. Consider seeing dentist  About  Teeth gri and antitting.   The pain sound like a neuritis.   And antiinflammatories can help   But can interefer with blood pressure  Control.  If   Needed we can get neurology appt  If ongoing .  Consider y oga for muscle  tension . Can try 5 mg flexeril 1-2 at night  While having the headache.    Neta Mends. Panosh M.D.  Total visit > 50% spent counseling and coordinating care

## 2013-08-06 ENCOUNTER — Telehealth: Payer: Self-pay | Admitting: Internal Medicine

## 2013-08-06 NOTE — Telephone Encounter (Signed)
I spoke with pt. She stated her dentist did a sinus scan and stated her left sinuses is stopped up. They rec she see ENT and she wanted to know who we use. i advised her normally [facility]. She stated she has been seen there before. She was told it could be d/t polyps, pnd or her tmj. I offered to see her here tomorrow but she is going to see about ENT. Nothing further needed

## 2013-08-21 ENCOUNTER — Telehealth: Payer: Self-pay | Admitting: Internal Medicine

## 2013-08-21 NOTE — Telephone Encounter (Signed)
Patient is aware 

## 2013-08-21 NOTE — Telephone Encounter (Signed)
Take temperature if having fever 100.3 and over or rash advise office visit  Otherwise can  Observe .

## 2013-08-21 NOTE — Telephone Encounter (Signed)
PCP Panosh per patient (per Epic, PCP [facility]).  Attempted to reach patient regarding questions about tick bite.  On callback, RN unable to reach patient at number given; message left on identified voicemail to contact office for assistance.  krs/can

## 2013-08-21 NOTE — Telephone Encounter (Signed)
Patient is calling because she had a tick bite Friday.  Last night she had some back pain, chills, flu like symptoms, fatigue.  She went to bed early and took a " muscle relaxer". This morning she felt a little nauseous but had some improvement with her symptoms.  She did not have any fever, diarrhea or vomiting.  She has had a history of UTI in the past.  Please advise.

## 2013-09-19 ENCOUNTER — Encounter: Payer: Self-pay | Admitting: Internal Medicine

## 2013-09-19 ENCOUNTER — Telehealth: Payer: Self-pay | Admitting: Internal Medicine

## 2013-09-19 ENCOUNTER — Ambulatory Visit (INDEPENDENT_AMBULATORY_CARE_PROVIDER_SITE_OTHER): Payer: BC Managed Care – PPO | Admitting: Internal Medicine

## 2013-09-19 VITALS — BP 142/90 | HR 103 | Temp 98.0°F | Wt 166.0 lb

## 2013-09-19 DIAGNOSIS — I10 Essential (primary) hypertension: Secondary | ICD-10-CM

## 2013-09-19 DIAGNOSIS — J069 Acute upper respiratory infection, unspecified: Secondary | ICD-10-CM

## 2013-09-19 MED ORDER — NYSTATIN 100000 UNIT/GM EX OINT: TOPICAL_OINTMENT | CUTANEOUS | Status: DC

## 2013-09-19 MED ORDER — LOSARTAN POTASSIUM 50 MG PO TABS: 50.0000 mg | ORAL_TABLET | Freq: Every day | ORAL | Status: DC

## 2013-09-19 NOTE — Telephone Encounter (Signed)
Per WP, yes if not decongestant

## 2013-09-19 NOTE — Telephone Encounter (Signed)
Patient notified of all.  She will check with pharmacist if any further questions.

## 2013-09-19 NOTE — Telephone Encounter (Signed)
Pt is inquiring to see if she can take chloratromaton (for allergies/ runny nose). She states that she has to be careful of what she takes because of her blood pressure. Please advise.

## 2013-09-19 NOTE — Progress Notes (Signed)
Chief Complaint  Patient presents with  . Follow-up    uri  . Hypertension    HPI: Patient comes in today for SDA for  new problem evaluation. Went to  Med clinic for [facility]   bp 170/102   And  Told to come in to pcp for bp control. Never went up on her diltiazem M. to 360 is only taking to 240 gets occasional palpitations. Exposed to many upper respiratory symptoms she is now on the toddler room would like to change jobs to decrease exposure. And stress on her body No current chest pain shortness of breath except as associated with drainage and cough. She has a history of cough with ACE inhibitors. Asks for a refill of nystatin ointment to chair [facility] for some perineal GU yeast flares. She has an appointment in December ROS: See pertinent positives and negatives per HPI.  Past Medical History  Diagnosis Date  . Hyperlipidemia   . Hypertension   . PCO (polycystic ovaries)   . Anal fissure   . Arthritis   . Allergy     History of shots as a child  . Rosacea   . History of infertility, female   . IBS (irritable bowel syndrome)     Family History  Problem Relation Age of Onset  . Hypertension    . Lung cancer      uncle  . Breast cancer      great aunt  . Hypertension Mother   . Heart disease Mother   . Hypertension Father   . Dementia Maternal Grandfather   . Heart disease Paternal Grandmother   . Dementia Paternal Grandfather     History   Social History  . Marital Status: Married    Spouse Name: N/A    Number of Children: 0  . Years of Education: N/A   Occupational History  . INFANT TEACHER    Social History Main Topics  . Smoking status: Former Smoker -- 0.50 packs/day for 7 years    Types: Cigarettes    Quit date: 12/13/1986  . Smokeless tobacco: None  . Alcohol Use: 0.0 oz/week    0 Glasses of wine per week     Comment: socially  . Drug Use: No  . Sexual Activity: None     Comment: married   Other Topics Concern  . 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    Outpatient Encounter Prescriptions as of 09/19/2013  Medication Sig Dispense Refill  . cyclobenzaprine (FLEXERIL) 5 MG tablet Take 1-2 at night as needed.  30 tablet  0  . diltiazem (DILACOR XR) 240 MG 24 hr capsule Take 240 mg by mouth daily.      . fluticasone (FLONASE) 50 MCG/ACT nasal spray 2 sprays per nostril daily.  MUST HAVE OFFICE VISIT FOR FURTHER REFILLS.  16 g  0  . Lactobacillus (ACIDOPHILUS) 10 MG CAPS Take by mouth daily.       Marland Kitchen levonorgestrel (MIRENA) 20 MCG/24HR IUD 1 each by Intrauterine route once.        . metroNIDAZOLE (METROGEL) 1 % gel Apply topically daily. Prn      . nystatin (MYCOSTATIN) cream Apply topically as needed.       Marland Kitchen  Zinc Oxide 30.6 % CREA Apply topically as needed.       . [DISCONTINUED] diltiazem (DILACOR XR) 180 MG 24 hr capsule Take 2 capsules (360 mg total) by mouth daily.  60 capsule  0  . losartan (COZAAR) 50 MG tablet Take 1 tablet (50 mg total) by mouth daily.  30 tablet  3  . nystatin ointment (MYCOSTATIN) Apply topically as directed  30 g  0   No facility-administered encounter medications on file as of 09/19/2013.    EXAM:  BP 142/90  Pulse 103  Temp(Src) 98 F (36.7 C) (Oral)  Wt 166 lb (75.297 kg)  BMI 32.68 kg/m2  SpO2 98%  Body mass index is 32.68 kg/(m^2).  GENERAL: vitals reviewed and listed above, alert, oriented, appears well hydrated and in no acute distress appears congested nontoxic HEENT: atraumatic, conjunctiva  clear, no obvious abnormalities on inspection of external nose and ears congested  NECK: no obvious masses on inspection palpation no significant adenopathy LUNGS: clear to auscultation bilaterally, no wheezes, rales or rhonchi, good air movement CV: HRRR, no clubbing cyanosisnl cap refill  No g or  m  MS: moves all extremities without noticeable focal  abnormality PSYCH: pleasant and cooperative, no obvious depression or anxiety  ASSESSMENT AND PLAN:  Discussed the following assessment and plan:  HYPERTENSION - on 240 ccb  cant take acei add arb for now consider diuerteic combo if needed   URI, acute - Appears to be a viral symptomatic treatment avoid decongestants at this time Review health care maintenance lab monitoring at followup visit. -Patient advised to return or notify health care team  if symptoms worsen or persist or new concerns arise.  Patient Instructions  Add  Losartan 50 mg per day  to the  Diltiazem   240. Mg per day   Check bp readings about 3 x per week.  ROV in about 6-8 weeks  Or as needed.     Neta Mends. Jodye Scali M.D.

## 2013-09-19 NOTE — Patient Instructions (Signed)
Add  Losartan 50 mg per day  to the  Diltiazem   240. Mg per day   Check bp readings about 3 x per week.  ROV in about 6-8 weeks  Or as needed.

## 2013-10-18 ENCOUNTER — Other Ambulatory Visit: Payer: Self-pay

## 2013-11-13 ENCOUNTER — Telehealth: Payer: Self-pay | Admitting: Internal Medicine

## 2013-11-13 NOTE — Telephone Encounter (Signed)
Pt would like  appt after 3pm tomorrow for fup on blood pressure. Can I sch?

## 2013-11-13 NOTE — Telephone Encounter (Signed)
Sure

## 2013-11-14 ENCOUNTER — Ambulatory Visit (INDEPENDENT_AMBULATORY_CARE_PROVIDER_SITE_OTHER): Payer: 59 | Admitting: Internal Medicine

## 2013-11-14 ENCOUNTER — Encounter: Payer: Self-pay | Admitting: Internal Medicine

## 2013-11-14 VITALS — BP 146/92 | HR 77 | Temp 98.2°F | Wt 167.0 lb

## 2013-11-14 DIAGNOSIS — R350 Frequency of micturition: Secondary | ICD-10-CM

## 2013-11-14 DIAGNOSIS — I10 Essential (primary) hypertension: Secondary | ICD-10-CM

## 2013-11-14 LAB — POCT URINALYSIS DIPSTICK
Bilirubin, UA: NEGATIVE
Blood, UA: NEGATIVE
Glucose, UA: NEGATIVE
Nitrite, UA: NEGATIVE
Spec Grav, UA: >=1.03
pH, UA: 6

## 2013-11-14 NOTE — Patient Instructions (Signed)
BP  seems  Better  Lab today. If  ok then ROV  in 3 months or as needed .  Repeat were 135/78 138/76

## 2013-11-14 NOTE — Progress Notes (Signed)
Chief Complaint  Patient presents with  . Follow-up    BP    HPI: FU    bp   New med  Seems to do ok no se  Initially bp in teens; no syncope . Some palpitations going out of town  Urinating more .   no fever flank pain  Took ibu recently ocass for foot .  trying to go out of town .   Not sick currently  ROS: See pertinent positives and negatives per HPI. No edema  Past Medical History  Diagnosis Date  . Hyperlipidemia   . Hypertension   . PCO (polycystic ovaries)   . Anal fissure   . Arthritis   . Allergy     History of shots as a child  . Rosacea   . History of infertility, female   . IBS (irritable bowel syndrome)     Family History  Problem Relation Age of Onset  . Hypertension    . Lung cancer      uncle  . Breast cancer      great aunt  . Hypertension Mother   . Heart disease Mother   . Hypertension Father   . Dementia Maternal Grandfather   . Heart disease Paternal Grandmother   . Dementia Paternal Grandfather     History   Social History  . Marital Status: Married    Spouse Name: N/A    Number of Children: 0  . Years of Education: N/A   Occupational History  . INFANT TEACHER    Social History Main Topics  . Smoking status: Former Smoker -- 0.50 packs/day for 7 years    Types: Cigarettes    Quit date: 12/13/1986  . Smokeless tobacco: None  . Alcohol Use: 0.0 oz/week    0 Glasses of wine per week     Comment: socially  . Drug Use: No  . Sexual Activity: None     Comment: married   Other Topics Concern  . 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    Outpatient Encounter Prescriptions as of 11/14/2013  Medication Sig  . cyclobenzaprine (FLEXERIL) 5 MG tablet Take 1-2 at night as needed.  . diltiazem (DILACOR XR) 240 MG 24 hr capsule Take 240 mg by mouth daily.  .  fluticasone (FLONASE) 50 MCG/ACT nasal spray 2 sprays per nostril daily.  MUST HAVE OFFICE VISIT FOR FURTHER REFILLS.  . Lactobacillus (ACIDOPHILUS) 10 MG CAPS Take by mouth daily.   Marland Kitchen levonorgestrel (MIRENA) 20 MCG/24HR IUD 1 each by Intrauterine route once.    Marland Kitchen losartan (COZAAR) 50 MG tablet Take 1 tablet (50 mg total) by mouth daily.  . metroNIDAZOLE (METROGEL) 1 % gel Apply topically daily. Prn  . nystatin ointment (MYCOSTATIN) Apply topically as directed  . Zinc Oxide 30.6 % CREA Apply topically as needed.     EXAM:  BP 146/92  Pulse 77  Temp(Src) 98.2 F (36.8 C) (Oral)  Wt 167 lb (75.751 kg)  SpO2 98%  Body mass index is 32.87 kg/(m^2). bp 135/78 right left 138/76 GENERAL: vitals reviewed and listed above, alert, oriented, appears well hydrated and in no acute distress HEENT: atraumatic, conjunctiva  clear, no obvious abnormalities on inspection of external nose  and ears  NECK: no obvious masses on inspection palpation  CV: HRRR, no clubbing cyanosis or  peripheral edema nl cap refill  MS: moves all extremities without noticeable focal  abnormality PSYCH: pleasant and cooperative, no obvious depression or anxiety  ASSESSMENT AND PLAN:  Discussed the following assessment and plan:  HYPERTENSION - seems better no sig se  of med  continue cbeck bmp today.  - Plan: POC Urinalysis Dipstick, Basic metabolic panel  Urinary frequency - no infection seen on ds  - Plan: POC Urinalysis Dipstick, Basic metabolic panel, CANCELED: POCT urine pregnancy  -Patient advised to return or notify health care team  if symptoms worsen or persist or new concerns arise.  Patient Instructions  BP  seems  Better  Lab today. If  ok then ROV  in 3 months or as needed .  Repeat were 135/78 138/76     Neta Mends. Panosh M.D.

## 2013-11-14 NOTE — Telephone Encounter (Signed)
Pt has been sch

## 2013-11-15 LAB — BASIC METABOLIC PANEL
BUN: 15 mg/dL (ref 6–23)
CO2: 27 mEq/L (ref 19–32)
Calcium: 9.2 mg/dL (ref 8.4–10.5)
Chloride: 105 mEq/L (ref 96–112)
Creatinine, Ser: 0.8 mg/dL (ref 0.4–1.2)
GFR: 87.39 mL/min (ref >60.00)
Glucose, Bld: 92 mg/dL (ref 70–99)
Potassium: 4.1 mEq/L (ref 3.5–5.1)

## 2013-11-16 ENCOUNTER — Encounter: Payer: Self-pay | Admitting: Family Medicine

## 2013-12-25 ENCOUNTER — Other Ambulatory Visit: Payer: Self-pay

## 2013-12-25 DIAGNOSIS — Z1231 Encounter for screening mammogram for malignant neoplasm of breast: Secondary | ICD-10-CM

## 2014-01-11 ENCOUNTER — Ambulatory Visit
Admission: RE | Admit: 2014-01-11 | Discharge: 2014-01-11 | Disposition: A | Discharge status: Home / Self Care | Payer: 59 | Source: Ambulatory Visit

## 2014-01-11 DIAGNOSIS — Z1231 Encounter for screening mammogram for malignant neoplasm of breast: Secondary | ICD-10-CM

## 2014-01-20 ENCOUNTER — Other Ambulatory Visit: Payer: Self-pay | Admitting: Internal Medicine

## 2014-02-02 ENCOUNTER — Encounter: Payer: Self-pay | Admitting: Family

## 2014-02-02 ENCOUNTER — Ambulatory Visit (INDEPENDENT_AMBULATORY_CARE_PROVIDER_SITE_OTHER): Payer: 59 | Admitting: Family

## 2014-02-02 VITALS — BP 130/80 | HR 108 | Temp 99.0°F | Resp 18 | Wt 167.0 lb

## 2014-02-02 DIAGNOSIS — H6691 Otitis media, unspecified, right ear: Secondary | ICD-10-CM | POA: Insufficient documentation

## 2014-02-02 DIAGNOSIS — H6690 Otitis media, unspecified, unspecified ear: Secondary | ICD-10-CM

## 2014-02-02 MED ORDER — AMOXICILLIN-POT CLAVULANATE 875-125 MG PO TABS: 1.0000 | ORAL_TABLET | Freq: Two times a day (BID) | ORAL | Status: DC

## 2014-02-02 MED ORDER — BENZONATATE 100 MG PO CAPS: 100.0000 mg | ORAL_CAPSULE | Freq: Three times a day (TID) | ORAL | Status: DC | PRN

## 2014-02-02 MED ORDER — ALBUTEROL SULFATE HFA 108 (90 BASE) MCG/ACT IN AERS: 2.0000 | INHALATION_SPRAY | Freq: Four times a day (QID) | RESPIRATORY_TRACT | Status: DC | PRN

## 2014-02-02 NOTE — Assessment & Plan Note (Signed)
Will rx with amoxicillin.  Refill albuterol to be used prn wheezing, add tessalon prn cough. Pt is instructed to follow up if symptoms worsen or if not improved in 2-3 days.

## 2014-02-02 NOTE — Progress Notes (Signed)
Subjective:    Patient ID: Gail [name], female    DOB: 12-Mar-1966, 48 y.o.   MRN: [id number]  HPI  Ms. Fear is a 48 yr old female who presents today with chief complaint of cough. She reports hx of chonic cough in the past which has been managed by Dr. Leafy Kindle. She reports that her chronic cough has been resolved for some time.  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 Reports + associated wheezing.  Cough is somewhat productive.  Denies associated fever.  + aching on Thursday night.  "cold all over."  She reports chronic left sided sinus congestion.  Some right sided ear discomfort. Some blood in nasal drainage.   Review of Systems See HPI  Past Medical History  Diagnosis Date  . Hyperlipidemia   . Hypertension   . PCO (polycystic ovaries)   . Anal fissure   . Arthritis   . Allergy     History of shots as a child  . Rosacea   . History of infertility, female   . IBS (irritable bowel syndrome)     History   Social History  . Marital Status: Married    Spouse Name: N/A    Number of Children: 0  . Years of Education: N/A   Occupational History  . INFANT TEACHER    Social History Main Topics  . Smoking status: Former Smoker -- 0.50 packs/day for 7 years    Types: Cigarettes    Quit date: 12/13/1986  . Smokeless tobacco: Not on file  . Alcohol Use: 0.0 oz/week    0 Glasses of wine per week     Comment: socially  . Drug Use: No  . Sexual Activity: Not on file     Comment: married   Other Topics Concern  . 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    Past Surgical History  Procedure Laterality Date  . Carpal tunnel release      x2 bilateral  . Wisdom tooth extraction    . Knee arthroscopy     left   . Wrist tendon release      bilateral thumbs    Family History  Problem Relation Age of Onset  . Hypertension    . Lung cancer      uncle  . Breast cancer      great aunt  . Hypertension Mother   . Heart disease Mother   . Hypertension Father   . Dementia Maternal Grandfather   . Heart disease Paternal Grandmother   . Dementia Paternal Grandfather     Allergies  Allergen Reactions  . Ace Inhibitors Cough  . Codeine Itching  . Erythromycin Ethylsuccinate     Stomach hurts REACTION: unspecified   can take azithromycin  . Doxycycline Rash  . Latex Rash    Current Outpatient Prescriptions on File Prior to Visit  Medication Sig Dispense Refill  . cyclobenzaprine (FLEXERIL) 5 MG tablet Take 1-2 at night as needed.  30 tablet  0  . diltiazem (DILACOR XR) 240 MG 24 hr capsule Take 240 mg by mouth daily.      Marland Kitchen  fluticasone (FLONASE) 50 MCG/ACT nasal spray 2 sprays per nostril daily.  MUST HAVE OFFICE VISIT FOR FURTHER REFILLS.  16 g  0  . Lactobacillus (ACIDOPHILUS) 10 MG CAPS Take by mouth daily.       Marland Kitchen levonorgestrel (MIRENA) 20 MCG/24HR IUD 1 each by Intrauterine route once.        Marland Kitchen losartan (COZAAR) 50 MG tablet TAKE 1 TABLET BY MOUTH EVERY DAY  30 tablet  5  . metroNIDAZOLE (METROGEL) 1 % gel Apply topically daily. Prn      . nystatin ointment (MYCOSTATIN) Apply topically as directed  30 g  0  . Zinc Oxide 30.6 % CREA Apply topically as needed.       . [DISCONTINUED] ranitidine (ZANTAC) 300 MG tablet Take 1 tablet (300 mg total) by mouth 2 (two) times daily.  60 tablet  1   No current facility-administered medications on file prior to visit.    BP 130/80  Pulse 108  Temp(Src) 99 F (37.2 C) (Oral)  Resp 18  Wt 167 lb (75.751 kg)  SpO2 97%       Objective:   Physical Exam  Constitutional: She is oriented to person, place, and time. She appears well-developed and well-nourished. No distress.  HENT:  Head: Normocephalic and atraumatic.  Right Ear: Ear  canal normal. Tympanic membrane is erythematous.  Left Ear: Tympanic membrane and ear canal normal.  Mouth/Throat: No oropharyngeal exudate, posterior oropharyngeal edema or posterior oropharyngeal erythema.  Cardiovascular: Normal rate and regular rhythm.   No murmur heard. Pulmonary/Chest: Effort normal.  Few coarse upper airway rhonchi, no wheezing noted.   Lymphadenopathy:    She has no cervical adenopathy.  Neurological: She is alert and oriented to person, place, and time.  Psychiatric: She has a normal mood and affect. Her behavior is normal. Judgment and thought content normal.          Assessment & Plan:

## 2014-02-02 NOTE — Patient Instructions (Signed)
Please call if symptoms worsen or if not improved in 2-3 days.   

## 2014-02-04 ENCOUNTER — Telehealth: Payer: Self-pay | Admitting: Internal Medicine

## 2014-02-04 NOTE — Telephone Encounter (Signed)
[facility] Triage Call Report Triage Record Num: [id number] Operator: Liana Gerold Patient Name: Gail [name] Call Date & Time: 02/02/2014 8:34:42AM Patient Phone: [phone] PCP: Standley Brooking. Panosh Patient Gender: Female PCP Fax : [phone] Patient DOB: [birth year] Practice Name: [facility] Reason for Call: Caller: Adaja/Patient; PCP: Shanon Ace (Family Practice); CB#: [phone]; Call regarding Cough/Congestion; Onset 01/31/14 Pt states she has cough, congestion, wheezing, bodyache, afebrile. All emergent symptoms ruled out per "pressure/pain above or below eyes, near ears over sinuses and yellow/green drainage from nose or down back of throat." Home care advice given. Per disposition see provider within 24 hours appt scheduled for 0930, 02/02/14 with Lenna Sciara, NP. Protocol(s) Used: Flu-Like Symptoms Recommended Outcome per Protocol: See Provider within 24 hours Reason for Outcome: Pressure/pain above or below eyes, near ears over sinuses (may also be described as fullness, worsens when bending forward, teeth or eye pain) AND yellow-green drainage from nose or down back of throat. Care Advice: ~ Use a cool mist humidifier to moisten air. Be sure to clean according to manufacturer's instructions. Speak with provider if swelling, redness and tenderness over sinuses above or below eyes develops or has temperature greater than 101.5 F (38.6 C). ~ ~ Consider use of a saline nasal spray per package directions to help relieve nasal congestion. ~ If you can, stop smoking now and avoid all secondhand smoke. ~ SYMPTOM / CONDITION MANAGEMENT A warm, moist compress placed on face, over eyes for 15 to 20 minutes, 5 to 6 times a day, may help relieve the congestion. ~ Analgesic/Antipyretic Advice - NSAIDs: Consider aspirin, ibuprofen, naproxen or ketoprofen for pain or fever as directed on label or by pharmacist/provider. PRECAUTIONS: - If over 46 years of age, should not take  longer than 1 week without consulting provider. EXCEPTIONS: - Should not be used if taking blood thinners or have bleeding problems. - Do not use if have history of sensitivity/allergy to any of these medications; or history of cardiovascular, ulcer, kidney, liver disease or diabetes unless approved by provider. - Do not exceed recommended dose or frequency. ~ Influenza Respiratory Hygiene: - Cover the nose/mouth tightly with a tissue when coughing or sneezing. - Use tissue one time and discard in the nearest waste receptacle. - Wash hands with soap and water or alcohol-based hand rub for at least 15 seconds after coming into contact with respiratory secretions and contaminated objects/materials. - When a tissue is not available, cough into the bend of the elbow. - Avoid touching your eyes, nose or mouth. - When ill wear a disposable face mask when around others, especially around those at high risk for flu-related complications, to help decrease the likelihood of infecting them. ~ ~ Do not go to work, school, or any community activity until you have not had a fever (100F or 37.8C) for at least 24 02/02/2014 8:50:55AM Page 1 of 2 [id number] [facility] Triage Call Report Patient Name: Maiko Salais continuation page/s hNoausrasl wIrritihgoatuito tna kTion gR feeliveevre-r Cedouncgiensgt imone:dication. This means staying at home for at least 3 to 5, possibly 7 days. - Wash hands with soap and water. - Add 1/2 teaspoon salt to 1 cup warm water to make saltwater solution. - Use a bulb syringe to draw up the saltwater solution. Turn the bulb upright to squeeze out any air. Fill the syringe until is full. - Bend over sink bowl and lean slightly toward sink. Gently insert the tip of the syringe into nostril about 1/2 inch. Point tip toward outer corner  of eye and GENTLY squeeze bulb to squirt saltwater into nostril. Forceful irrigation to clear sinuses requires the use of sterile water  or saline. - Let solution drain from nose. Solution may come out of the other nostril or even your mouth. Do two full syringes of solution in each nostril. - Rinse syringe in clean water several times. Be sure water comes out clear. Dry the bulb syringe and store in a container or plastic bag. ~ Total water intake includes drinking water, water in beverages, and water contained in food. Fluids make up about 80% of the body's total hydration need. Individual fluid requirement to maintain hydration vary based on physical activity, environmental factors and illness. Limit fluids that contain sugar, caffeine, or alcohol. Urine will be very light yellow color when you drink enough fluids. ~ 02

## 2014-02-04 NOTE — Telephone Encounter (Signed)
Patient Information:  Caller Name: Beatric  Phone: [phone]  Patient: Gail [name], Gail [name]  Gender: Female  DOB: [birth year]  Age: 48 Years  PCP: Shanon Ace [facility])  Pregnant: No  Office Follow Up:  Does the office need to follow up with this patient?: No  Instructions For The Office: N/A   Symptoms  Reason For Call & Symptoms: Pt was seen in office 02/02/2014 and pt had an ear infection, bronchitis and got Augmentin.  Afebrile.  She feels better today.  She is sore from coughing.  She has used her humidifier.  Pt is also having heart palpitations more frequently but not as bad as 02/04/14.  He has a HX of heart palpatations  And just calling for reassurance with taking new medications, Albuterol, Tessalon Perles and Augmentin.   Reviewed Health History In EMR: Yes  Reviewed Medications In EMR: Yes  Reviewed Allergies In EMR: Yes  Reviewed Surgeries / Procedures: Yes  Date of Onset of Symptoms: 01/31/2014  Treatments Tried: Augmentin  Treatments Tried Worked: No OB / GYN:  LMP: Unknown  Guideline(s) Used:  Heart Rate and Heartbeat Questions  Disposition Per Guideline:   See Within 2 Weeks in Office  Reason For Disposition Reached:   Palpitations are a chronic symptom (recurrent or ongoing AND present > 4 weeks)  Advice Given:  Expected Course  : If your symptoms do not improve over the next couple days then you should make an appointment to see your doctor.  Call Back If:  Chest pain, lightheadedness, or difficulty breathing occurs  Heart beating more than 130 beats / minute  More than 3 extra or skipped beats / minute  You become worse.  Patient Will Follow Care Advice:  YES

## 2014-02-17 ENCOUNTER — Other Ambulatory Visit: Payer: Self-pay | Admitting: Cardiology

## 2014-03-08 ENCOUNTER — Telehealth: Payer: Self-pay | Admitting: Family Medicine

## 2014-03-08 NOTE — Telephone Encounter (Signed)
Pt returned my call.  Left a message on my machine.  Called her back.  Did not reach her.  Left a message.

## 2014-03-08 NOTE — Telephone Encounter (Signed)
Spoke to the patient.  She states her heart continues to skip beats.  It doesn't happen everyday and tends to happen more in the afternoon.  She has had no changes in medications.  Has no caffeine or change in diet.  She is under a lot of stress.  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  Scheduled her on 03/13/14.  Had to use SDA slot due to pt's schedule.

## 2014-03-08 NOTE — Telephone Encounter (Signed)
Pt returned by call and left a message on my machine.  Returned her call and left a message on her cell for her to return my call.

## 2014-03-08 NOTE — Telephone Encounter (Signed)
The patient called and left a message on my machine.  Stated she continues to have heart palpitations but they seem to be worsening. Tried calling the pt back.  Left a message on her cell.  No machine at her home number.

## 2014-03-10 NOTE — Telephone Encounter (Signed)
Fine  will see her next week.

## 2014-03-13 ENCOUNTER — Encounter: Payer: Self-pay | Admitting: Internal Medicine

## 2014-03-13 ENCOUNTER — Ambulatory Visit (INDEPENDENT_AMBULATORY_CARE_PROVIDER_SITE_OTHER): Payer: 59 | Admitting: Internal Medicine

## 2014-03-13 VITALS — BP 128/80 | HR 94 | Temp 98.9°F | Ht 59.25 in | Wt 168.0 lb

## 2014-03-13 DIAGNOSIS — R4589 Other symptoms and signs involving emotional state: Secondary | ICD-10-CM

## 2014-03-13 DIAGNOSIS — F411 Generalized anxiety disorder: Secondary | ICD-10-CM | POA: Insufficient documentation

## 2014-03-13 DIAGNOSIS — R002 Palpitations: Secondary | ICD-10-CM

## 2014-03-13 DIAGNOSIS — F430 Acute stress reaction: Secondary | ICD-10-CM

## 2014-03-13 DIAGNOSIS — R7309 Other abnormal glucose: Secondary | ICD-10-CM

## 2014-03-13 DIAGNOSIS — I10 Essential (primary) hypertension: Secondary | ICD-10-CM

## 2014-03-13 DIAGNOSIS — R011 Cardiac murmur, unspecified: Secondary | ICD-10-CM

## 2014-03-13 DIAGNOSIS — E785 Hyperlipidemia, unspecified: Secondary | ICD-10-CM

## 2014-03-13 MED ORDER — LORAZEPAM 0.5 MG PO TABS: 0.5000 mg | ORAL_TABLET | Freq: Two times a day (BID) | ORAL | Status: DC | PRN

## 2014-03-13 NOTE — Patient Instructions (Signed)
Will notify you  of labs when available. These may be triggered by severe stress.  Sound like the PVCs that you have had before. blood pressure is good.  In the short run  Can use  Lorazepam as needed twice a day or as needed.  Return office visit in 3-4 weeks or as needed.

## 2014-03-13 NOTE — Progress Notes (Signed)
Chief Complaint  Patient presents with  . Follow-up    HPI: Pt comes in for increasing worries aboutpalpitations  And under stress   Church job and work  See phone note .   Began about march 15th after a very stressful performance  Was very nervous after sing . shaken fterwareds.  Then began next day to have palpitations.  Then nervous  Soft ball game .gets spells or  Premature beats without assoc sx but can make her worry. No racing syncope  ocass toingling feeling  And  Could be  tenssion and neck sore .  No cp sob fever  just wants to make sure not heart.  No using caffeine, etoh.  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   ROS: See pertinent positives and negatives per HPI.rosacea breaking out from stress sees chiro and says has tense muscle in legs . Last labs a while back  Last cards eval 2012 nl echo.  Was given atenolol to use prn 25 but hasn't tried this    Past Medical History  Diagnosis Date  . Hyperlipidemia   . Hypertension   . PCO (polycystic ovaries)   . Anal fissure   . Arthritis   . Allergy     History of shots as a child  . Rosacea   . History of infertility, female   . IBS (irritable bowel syndrome)     Family History  Problem Relation Age of Onset  . Hypertension    . Lung cancer      uncle  . Breast cancer      great aunt  . Hypertension Mother   . Heart disease Mother   . Hypertension Father   . Dementia Maternal Grandfather   . Heart disease Paternal Grandmother   . Dementia Paternal Grandfather     History   Social History  . Marital Status: Married    Spouse Name: N/A    Number of Children: 0  . Years of Education: N/A   Occupational History  . INFANT TEACHER    Social History Main Topics  . Smoking status: Former Smoker -- 0.50 packs/day for 7 years    Types: Cigarettes    Quit date: 12/13/1986  . Smokeless tobacco: None  . Alcohol Use: 0.0 oz/week    0 Glasses of wine per week   Comment: socially  . Drug Use: No  . Sexual Activity: None     Comment: married   Other Topics Concern  . 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    Outpatient Encounter Prescriptions as of 03/13/2014  Medication Sig  . albuterol (PROVENTIL HFA;VENTOLIN HFA) 108 (90 BASE) MCG/ACT inhaler Inhale 2 puffs into the lungs every 6 (six) hours as needed for wheezing or shortness of breath.  . diltiazem (DILACOR XR) 240 MG 24 hr capsule TAKE ONE CAPSULE BY MOUTH DAILY  . fluticasone (FLONASE) 50 MCG/ACT nasal spray 2 sprays per nostril daily.  MUST HAVE OFFICE VISIT FOR FURTHER REFILLS.  . Lactobacillus (ACIDOPHILUS) 10 MG CAPS Take by mouth daily.   Marland Kitchen levonorgestrel (MIRENA) 20 MCG/24HR IUD 1 each by Intrauterine route once.    Marland Kitchen losartan (COZAAR) 50  MG tablet TAKE 1 TABLET BY MOUTH EVERY DAY  . metroNIDAZOLE (METROGEL) 1 % gel Apply topically daily. Prn  . nystatin ointment (MYCOSTATIN) Apply topically as directed  . Zinc Oxide 30.6 % CREA Apply topically as needed.   . [DISCONTINUED] diltiazem (DILACOR XR) 240 MG 24 hr capsule Take 240 mg by mouth daily.  Marland Kitchen LORazepam (ATIVAN) 0.5 MG tablet Take 1 tablet (0.5 mg total) by mouth 2 (two) times daily as needed for anxiety.  . [DISCONTINUED] amoxicillin-clavulanate (AUGMENTIN) 875-125 MG per tablet Take 1 tablet by mouth 2 (two) times daily.  . [DISCONTINUED] benzonatate (TESSALON) 100 MG capsule Take 1 capsule (100 mg total) by mouth 3 (three) times daily as needed for cough.  . [DISCONTINUED] cyclobenzaprine (FLEXERIL) 5 MG tablet Take 1-2 at night as needed.    EXAM:  BP 128/80  Pulse 94  Temp(Src) 98.9 F (37.2 C) (Oral)  Ht 4' 11.25" (1.505 m)  Wt 168 lb (76.204 kg)  BMI 33.64 kg/m2  SpO2 98%  Body mass index is 33.64 kg/(m^2).  GENERAL: vitals reviewed and  listed above, alert, oriented, appears well hydrated and in no acute distress nl affect but slightly anxioous talkative nl cognition no tremor nl neurogrossly  HEENT: atraumatic, conjunctiva  clear, no obvious abnormalities on inspection of external nose and ears OP : no lesion edema or exudate  NECK: no obvious masses on inspection palpation  No nodes  LUNGS: clear to auscultation bilaterally, no wheezes, rales or rhonchi, good air movement CV: HRRR, no clubbing cyanosis or  peripheral edema nl cap refill  Short sem flow type m at LUSB sitting  And can be heard in left neck pulse r and 88  MS: moves all extremities without noticeable focal  abnormality PSYCH: pleasant and cooperative,   ASSESSMENT AND PLAN:  Discussed the following assessment and plan:  Heart palpitations - noted to be benign pvc in past  increasing poss anxiety trigger more frequent - Plan: Basic metabolic panel, CBC with Differential, Hemoglobin A1c, Hepatic function panel, Lipid panel, TSH, T4, free  Unspecified essential hypertension - controlled  - Plan: Basic metabolic panel, CBC with Differential, Hemoglobin A1c, Hepatic function panel, Lipid panel, TSH, T4, free  HYPERLIPIDEMIA - Plan: Basic metabolic panel, CBC with Differential, Hemoglobin A1c, Hepatic function panel, Lipid panel, TSH, T4, free  HYPERGLYCEMIA, BORDERLINE - due for recheck non fasting  - Plan: Basic metabolic panel, CBC with Differential, Hemoglobin A1c, Hepatic function panel, Lipid panel, TSH, T4, free  Systolic murmur - poss flow murmur from anxiety  but did hear in left neck will reexamine at fu visit  Anxiety in acute stress reaction - disc strtategies  trial ativan short term , get labs and fu consider cbt other if needed  Was given atenolol 25 to use prn from dr C in past never used ? May be worth a try but try the benzo first at this time -Patient advised to return or notify health care team  if symptoms worsen ,persist or new concerns  arise. No evidence of acute card pulm problem today   Patient Instructions  Will notify you  of labs when available. These may be triggered by severe stress.  Sound like the PVCs that you have had before. blood pressure is good.  In the short run  Can use  Lorazepam as needed twice a day or as needed.  Return office visit in 3-4 weeks or as needed.       Standley Brooking. Kamica Florance M.D.  Pre visit review using our clinic review tool, if applicable. No additional management support is needed unless otherwise documented below in the visit note.

## 2014-03-14 LAB — BASIC METABOLIC PANEL
BUN: 15 mg/dL (ref 6–23)
CHLORIDE: 102 meq/L (ref 96–112)
CO2: 27 mEq/L (ref 19–32)
CREATININE: 0.9 mg/dL (ref 0.4–1.2)
Calcium: 9.1 mg/dL (ref 8.4–10.5)
GFR: 76.69 mL/min (ref >60.00)
Glucose, Bld: 105 mg/dL — ABNORMAL HIGH (ref 70–99)
Potassium: 4.1 mEq/L (ref 3.5–5.1)
Sodium: 137 mEq/L (ref 135–145)

## 2014-03-14 LAB — LIPID PANEL
CHOLESTEROL: 190 mg/dL (ref 0–200)
HDL: 43.2 mg/dL (ref >39.00)
LDL CALC: 128 mg/dL — AB (ref 0–99)
TRIGLYCERIDES: 92 mg/dL (ref 0.0–149.0)
Total CHOL/HDL Ratio: 4
VLDL: 18.4 mg/dL (ref 0.0–40.0)

## 2014-03-14 LAB — CBC WITH DIFFERENTIAL/PLATELET
BASOS ABS: 0 10*3/uL (ref 0.0–0.1)
Basophils Relative: 0.7 % (ref 0.0–3.0)
EOS PCT: 2.1 % (ref 0.0–5.0)
Eosinophils Absolute: 0.1 10*3/uL (ref 0.0–0.7)
HCT: 41.6 % (ref 36.0–46.0)
Hemoglobin: 14.1 g/dL (ref 12.0–15.0)
LYMPHS ABS: 1.8 10*3/uL (ref 0.7–4.0)
Lymphocytes Relative: 25.7 % (ref 12.0–46.0)
MCHC: 34 g/dL (ref 30.0–36.0)
MCV: 94.2 fl (ref 78.0–100.0)
MONO ABS: 0.7 10*3/uL (ref 0.1–1.0)
Monocytes Relative: 10 % (ref 3.0–12.0)
Neutro Abs: 4.2 10*3/uL (ref 1.4–7.7)
Neutrophils Relative %: 61.5 % (ref 43.0–77.0)
PLATELETS: 230 10*3/uL (ref 150.0–400.0)
RBC: 4.42 Mil/uL (ref 3.87–5.11)
RDW: 12.6 % (ref 11.5–14.6)
WBC: 6.9 10*3/uL (ref 4.5–10.5)

## 2014-03-14 LAB — HEPATIC FUNCTION PANEL
ALBUMIN: 4.2 g/dL (ref 3.5–5.2)
ALT: 15 U/L (ref 0–35)
AST: 19 U/L (ref 0–37)
Alkaline Phosphatase: 67 U/L (ref 39–117)
Bilirubin, Direct: 0 mg/dL (ref 0.0–0.3)
Total Bilirubin: 0.5 mg/dL (ref 0.3–1.2)
Total Protein: 7.3 g/dL (ref 6.0–8.3)

## 2014-03-14 LAB — TSH: TSH: 1.1 u[IU]/mL (ref 0.35–5.50)

## 2014-03-14 LAB — T4, FREE: Free T4: 0.76 ng/dL (ref 0.60–1.60)

## 2014-03-14 LAB — HEMOGLOBIN A1C: HEMOGLOBIN A1C: 5.9 % (ref 4.6–6.5)

## 2014-03-19 ENCOUNTER — Telehealth: Payer: Self-pay | Admitting: Family Medicine

## 2014-03-19 NOTE — Telephone Encounter (Signed)
The patient called and left a voicemail message asking whether she could be going through the change.  Wanted to know if this would show up in her lab work. Please advise.  Thanks!

## 2014-03-19 NOTE — Telephone Encounter (Signed)
Possible to get some changes that come and go Will notify you  of labs when available. We obtained will not tell us this. If she is skipping periods  Sometimes a lab test is helpful but mostly a  Clinical diagnosis.

## 2014-03-20 NOTE — Telephone Encounter (Signed)
Left a message at the below listed number for the pt to return my call. 

## 2014-03-22 NOTE — Telephone Encounter (Signed)
Left a message on the patient's cell to return my call.

## 2014-03-22 NOTE — Telephone Encounter (Signed)
Left a message for the pt to return my call.  

## 2014-03-27 NOTE — Telephone Encounter (Signed)
Left a message on personally identified cell informing the pt of all.  Instructed her to call back if there are any further questions.

## 2014-04-08 ENCOUNTER — Ambulatory Visit: Payer: 59 | Admitting: Internal Medicine

## 2014-04-17 ENCOUNTER — Encounter: Payer: Self-pay | Admitting: Internal Medicine

## 2014-04-17 ENCOUNTER — Ambulatory Visit (INDEPENDENT_AMBULATORY_CARE_PROVIDER_SITE_OTHER): Payer: 59 | Admitting: Internal Medicine

## 2014-04-17 VITALS — BP 126/60 | Temp 98.4°F | Ht 59.25 in | Wt 170.0 lb

## 2014-04-17 DIAGNOSIS — F430 Acute stress reaction: Secondary | ICD-10-CM

## 2014-04-17 DIAGNOSIS — R232 Flushing: Secondary | ICD-10-CM

## 2014-04-17 DIAGNOSIS — R002 Palpitations: Secondary | ICD-10-CM

## 2014-04-17 DIAGNOSIS — F411 Generalized anxiety disorder: Secondary | ICD-10-CM

## 2014-04-17 DIAGNOSIS — L719 Rosacea, unspecified: Secondary | ICD-10-CM

## 2014-04-17 DIAGNOSIS — I10 Essential (primary) hypertension: Secondary | ICD-10-CM

## 2014-04-17 DIAGNOSIS — R4589 Other symptoms and signs involving emotional state: Secondary | ICD-10-CM

## 2014-04-17 MED ORDER — NYSTATIN 100000 UNIT/GM EX OINT: TOPICAL_OINTMENT | CUTANEOUS | Status: DC

## 2014-04-17 MED ORDER — METRONIDAZOLE 1 % EX GEL: Freq: Every day | CUTANEOUS | Status: DC

## 2014-04-17 NOTE — Patient Instructions (Addendum)
Can check   fsh hormone  could  be perimenopausal. And triggering  Some of the stress sx . If so consider talking with your gyne . Exam is good and labs are good.

## 2014-04-17 NOTE — Progress Notes (Signed)
Chief Complaint  Patient presents with  . Follow-up    pitations  bp stress    HPI: Fu palpitations ht and anxiety stress Weepy  More recently   But functional and working    ? Fertility loss.  Wonders if she is perimenopausal  has iud  Mom had menopause in early 46s.  Some hot flushes didn't take any of the lorazepam but has them in case  Things doing somewhat better  Still job looking.  physicality of getting on floor is a problem  Her 46 yo dog bit her in meantime  Td utd.  ROS: See pertinent positives and negatives per HPI. Asks for refill rosacea medna dn as needed  Mycostatin usually given by gyne   Past Medical History  Diagnosis Date  . Hyperlipidemia   . Hypertension   . PCO (polycystic ovaries)   . Anal fissure   . Arthritis   . Allergy     History of shots as a child  . Rosacea   . History of infertility, female   . IBS (irritable bowel syndrome)     Family History  Problem Relation Age of Onset  . Hypertension    . Lung cancer      uncle  . Breast cancer      great aunt  . Hypertension Mother   . Heart disease Mother   . Hypertension Father   . Dementia Maternal Grandfather   . Heart disease Paternal Grandmother   . Dementia Paternal Grandfather     History   Social History  . Marital Status: Married    Spouse Name: N/A    Number of Children: 0  . Years of Education: N/A   Occupational History  . INFANT TEACHER    Social History Main Topics  . Smoking status: Former Smoker -- 0.50 packs/day for 7 years    Types: Cigarettes    Quit date: 12/13/1986  . Smokeless tobacco: None  . Alcohol Use: 0.0 oz/week    0 Glasses of wine per week     Comment: socially  . Drug Use: No  . Sexual Activity: None     Comment: married   Other Topics Concern  . 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    Outpatient Encounter Prescriptions as of 04/17/2014  Medication Sig  . albuterol (PROVENTIL HFA;VENTOLIN HFA) 108 (90 BASE) MCG/ACT inhaler Inhale 2 puffs into the lungs every 6 (six) hours as needed for wheezing or shortness of breath.  . diltiazem (DILACOR XR) 240 MG 24 hr capsule TAKE ONE CAPSULE BY MOUTH DAILY  . fluticasone (FLONASE) 50 MCG/ACT nasal spray 2 sprays per nostril daily.  MUST HAVE OFFICE VISIT FOR FURTHER REFILLS.  . Lactobacillus (ACIDOPHILUS) 10 MG CAPS Take by mouth daily.   Marland Kitchen levonorgestrel (MIRENA) 20 MCG/24HR IUD 1 each by Intrauterine route once.    Marland Kitchen LORazepam (ATIVAN) 0.5 MG tablet Take 1 tablet (0.5 mg total) by mouth 2 (two) times daily as needed for anxiety.  Marland Kitchen losartan (COZAAR) 50 MG tablet TAKE 1 TABLET BY MOUTH EVERY DAY  . metroNIDAZOLE (METROGEL) 1 % gel Apply topically daily. Prnfor rosacea  . nystatin ointment (MYCOSTATIN) Apply topically as directed  . Zinc  Oxide 30.6 % CREA Apply topically as needed.   . [DISCONTINUED] metroNIDAZOLE (METROGEL) 1 % gel Apply topically daily. Prn  . [DISCONTINUED] nystatin ointment (MYCOSTATIN) Apply topically as directed    EXAM:  BP 126/60  Temp(Src) 98.4 F (36.9 C) (Oral)  Ht 4' 11.25" (1.505 m)  Wt 170 lb (77.111 kg)  BMI 34.04 kg/m2  Body mass index is 34.04 kg/(m^2).  GENERAL: vitals reviewed and listed above, alert, oriented, appears well hydrated and in no acute distress good eye contact weepy at times  Speaking of never bearing her own child  HEENT: atraumatic, conjunctiva  clear, no obvious abnormalities on inspection of external nose and ears NECK: no obvious masses on inspection palpation  LUNGS: clear to auscultation bilaterally, no wheezes, rales or rhonchi, good air movement CV: HRRR, no clubbing cyanosis or  peripheral edema nl cap refill  Short 2/6 sem usb no radiation MS: moves all extremities without noticeable focal  abnormality PSYCH: pleasant and cooperative,  Lab  Results  Component Value Date   WBC 6.9 03/13/2014   HGB 14.1 03/13/2014   HCT 41.6 03/13/2014   PLT 230.0 03/13/2014   GLUCOSE 105* 03/13/2014   CHOL 190 03/13/2014   TRIG 92.0 03/13/2014   HDL 43.20 03/13/2014   LDLDIRECT 160.7 12/10/2010   LDLCALC 128* 03/13/2014   ALT 15 03/13/2014   AST 19 03/13/2014   NA 137 03/13/2014   K 4.1 03/13/2014   CL 102 03/13/2014   CREATININE 0.9 03/13/2014   BUN 15 03/13/2014   CO2 27 03/13/2014   TSH 1.10 03/13/2014   HGBA1C 5.9 03/13/2014    ASSESSMENT AND PLAN:  Discussed the following assessment and plan:  Flushes - poss menopausal? ok to check fsh disc limitationas of the test - Plan: Follicle Stimulating Hormone  HYPERTENSION  Heart palpitations - improved some   Anxiety in acute stress reaction - improved not takin med   Rosacea - ok to refill med Could consider r ssri if continuing sx and  Or perimenopausal may talk with her gyne.  Her weepiness seems related to reminder and finality of  loss of fertility. I think she may do fine with support and follow  Up. -Patient advised to return or notify health care team  if symptoms worsen ,persist or new concerns arise. Ok to refill meds   Patient Instructions  Can check   fsh hormone  could  be perimenopausal. And triggering  Some of the stress sx . If so consider talking with your gyne . Exam is good and labs are good.      Standley Brooking. Panosh M.D.  Pre visit review using our clinic review tool, if applicable. No additional management support is needed unless otherwise documented below in the visit note.

## 2014-04-18 LAB — FOLLICLE STIMULATING HORMONE: FSH: 6.4 m[IU]/mL

## 2014-04-22 ENCOUNTER — Telehealth: Payer: Self-pay | Admitting: Internal Medicine

## 2014-04-22 NOTE — Telephone Encounter (Signed)
[employer] denied PA request for 1% Metronidazole.  Pt must try and fail one of the following 0.75% Metronidazole or 0.75% Metrogel

## 2014-04-22 NOTE — Telephone Encounter (Signed)
Ok to try metronidazole gel .75%

## 2014-04-23 MED ORDER — METRONIDAZOLE 0.75 % EX GEL: 1.0000 "application " | Freq: Every day | CUTANEOUS | Status: DC

## 2014-04-23 NOTE — Telephone Encounter (Signed)
Sent in new rx to the pharmacy.  Left a message on personally identified cell informing the pt to pick up new rx.  Instructed her to call back if any questions.

## 2014-05-10 ENCOUNTER — Other Ambulatory Visit (HOSPITAL_COMMUNITY)
Admission: RE | Admit: 2014-05-10 | Discharge: 2014-05-10 | Disposition: A | Discharge status: Home / Self Care | Payer: 59 | Source: Ambulatory Visit | Attending: Internal Medicine | Admitting: Internal Medicine

## 2014-05-10 ENCOUNTER — Ambulatory Visit (INDEPENDENT_AMBULATORY_CARE_PROVIDER_SITE_OTHER): Payer: 59 | Admitting: Internal Medicine

## 2014-05-10 ENCOUNTER — Encounter: Payer: Self-pay | Admitting: Internal Medicine

## 2014-05-10 VITALS — BP 144/84 | Temp 98.3°F | Wt 171.0 lb

## 2014-05-10 DIAGNOSIS — N760 Acute vaginitis: Secondary | ICD-10-CM | POA: Insufficient documentation

## 2014-05-10 DIAGNOSIS — B3731 Acute candidiasis of vulva and vagina: Secondary | ICD-10-CM

## 2014-05-10 DIAGNOSIS — B373 Candidiasis of vulva and vagina: Secondary | ICD-10-CM

## 2014-05-10 DIAGNOSIS — M25559 Pain in unspecified hip: Secondary | ICD-10-CM

## 2014-05-10 DIAGNOSIS — R21 Rash and other nonspecific skin eruption: Secondary | ICD-10-CM

## 2014-05-10 MED ORDER — FLUCONAZOLE 150 MG PO TABS: 150.0000 mg | ORAL_TABLET | Freq: Once | ORAL | Status: DC

## 2014-05-10 MED ORDER — METRONIDAZOLE 500 MG PO TABS: 500.0000 mg | ORAL_TABLET | Freq: Two times a day (BID) | ORAL | Status: DC

## 2014-05-10 NOTE — Patient Instructions (Signed)
looks like yeast  Take diflucan and repeat in 3 days if needed. Will let you know when results back about  Possible BV.

## 2014-05-10 NOTE — Progress Notes (Signed)
Pre visit review using our clinic review tool, if applicable. No additional management support is needed unless otherwise documented below in the visit note.   Chief Complaint  Patient presents with  . Vaginal Itching  . Vaginal Discharge    HPI: Patient comes in today for SDA for  new problem evaluation. Called gyne but couldn't g  et  oin at right time thought so comes to PCP today for evaluation ( Friday) side itching last week thought it was a thigh and had a red mark near underwear area. Using monistat  Externally  . Few bumps on outside  remote hx of  Vaginal boric acid  Was helpful as needed  In past,  Gyne  Called and ? To see this .Nystatin powder use.  Now on prednisone.   For hip leg for this week and 2 more days ROS: See pertinent positives and negatives per HPI. No utis sx   Past Medical History  Diagnosis Date  . Hyperlipidemia   . Hypertension   . PCO (polycystic ovaries)   . Anal fissure   . Arthritis   . Allergy     History of shots as a child  . Rosacea   . History of infertility, female   . IBS (irritable bowel syndrome)     Family History  Problem Relation Age of Onset  . Hypertension    . Lung cancer      uncle  . Breast cancer      great aunt  . Hypertension Mother   . Heart disease Mother   . Hypertension Father   . Dementia Maternal Grandfather   . Heart disease Paternal Grandmother   . Dementia Paternal Grandfather     History   Social History  . Marital Status: Married    Spouse Name: N/A    Number of Children: 0  . Years of Education: N/A   Occupational History  . INFANT TEACHER    Social History Main Topics  . Smoking status: Former Smoker -- 0.50 packs/day for 7 years    Types: Cigarettes    Quit date: 12/13/1986  . Smokeless tobacco: None  . Alcohol Use: 0.0 oz/week    0 Glasses of wine per week     Comment: socially  . Drug Use: No  . Sexual Activity: None     Comment: married   Other Topics Concern  . 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    Outpatient Encounter Prescriptions as of 05/10/2014  Medication Sig  . albuterol (PROVENTIL HFA;VENTOLIN HFA) 108 (90 BASE) MCG/ACT inhaler Inhale 2 puffs into the lungs every 6 (six) hours as needed for wheezing or shortness of breath.  . diltiazem (DILACOR XR) 240 MG 24 hr capsule TAKE ONE CAPSULE BY MOUTH DAILY  . fluticasone (FLONASE) 50 MCG/ACT nasal spray 2 sprays per nostril daily.  MUST HAVE OFFICE VISIT FOR FURTHER REFILLS.  . Lactobacillus (ACIDOPHILUS) 10 MG CAPS Take by mouth daily.   Marland Kitchen levonorgestrel (MIRENA) 20 MCG/24HR IUD 1 each by Intrauterine route once.    Marland Kitchen LORazepam (ATIVAN) 0.5 MG tablet Take 1 tablet (0.5 mg total) by mouth 2 (two) times daily as needed for anxiety.  Marland Kitchen losartan (COZAAR) 50  MG tablet TAKE 1 TABLET BY MOUTH EVERY DAY  . metroNIDAZOLE (METROGEL) 0.75 % gel Apply 1 application topically daily.  Marland Kitchen nystatin ointment (MYCOSTATIN) Apply topically as directed  . Zinc Oxide 30.6 % CREA Apply topically as needed.   . fluconazole (DIFLUCAN) 150 MG tablet Take 1 tablet (150 mg total) by mouth once. Can repeat in 3 days  . metroNIDAZOLE (FLAGYL) 500 MG tablet Take 1 tablet (500 mg total) by mouth 2 (two) times daily. If needed for bacterial infection    EXAM:  BP 144/84  Temp(Src) 98.3 F (36.8 C) (Oral)  Wt 171 lb (77.565 kg)  Body mass index is 34.24 kg/(m^2).  GENERAL: vitals reviewed and listed above, alert, oriented, appears well hydrated and in no acute distress HEENT: atraumatic, conjunctiva  clear, no obvious abnormalities on inspection of external nose and ears MS: moves all extremities without noticeable focal  Abnormality som erigh tleg pain with hip rotation PSYCH: pleasant and cooperative, no obvious depression or anxiety EXT GU:  2 +  red introitus with white dc noted  Scattered follicular rash over shaved area but no abscess or boils noted no adenopathy  Set for y trich and bv  . ASSESSMENT AND PLAN:  Discussed the following assessment and plan:  Vaginitis and vulvovaginitis - recurrent poss sever aggravated but pred? can add metronidazole if needed affirm sent today   Vagina, candidiasis  Rash and nonspecific skin eruption  Hip pain - under rx on pred   -Patient advised to return or notify health care team  if symptoms worsen ,persist or new concerns arise.  Patient Instructions  looks like yeast  Take diflucan and repeat in 3 days if needed. Will let you know when results back about  Possible BV.    Standley Brooking. Panosh M.D.

## 2014-05-10 NOTE — Addendum Note (Signed)
Addended by: Miles Costain T on: 05/10/2014 04:52 PM   Modules accepted: Orders

## 2014-06-03 ENCOUNTER — Encounter: Payer: Self-pay | Admitting: Internal Medicine

## 2014-06-03 ENCOUNTER — Ambulatory Visit (INDEPENDENT_AMBULATORY_CARE_PROVIDER_SITE_OTHER): Payer: 59 | Admitting: Internal Medicine

## 2014-06-03 VITALS — BP 136/74 | Temp 98.0°F | Ht 59.25 in | Wt 173.0 lb

## 2014-06-03 DIAGNOSIS — E282 Polycystic ovarian syndrome: Secondary | ICD-10-CM

## 2014-06-03 DIAGNOSIS — R002 Palpitations: Secondary | ICD-10-CM

## 2014-06-03 DIAGNOSIS — I10 Essential (primary) hypertension: Secondary | ICD-10-CM

## 2014-06-03 DIAGNOSIS — R232 Flushing: Secondary | ICD-10-CM

## 2014-06-03 NOTE — Patient Instructions (Addendum)
Can check fasting blood sugar and should be below 100 for normal 100-125 pre diabetes or glucose intolerance  Can also check ocass 2 hours after eating.    120 range   Some elevated .   200 and over is diabetes  . Pre diabetes in between.  Blood  pressure is better  Today . Consider  seeing Dr Thornell Mule eric if  Ears still a problem .

## 2014-06-03 NOTE — Progress Notes (Signed)
Chief Complaint  Patient presents with  . Follow-up    HPI:  FU flushing stress palpitations  bp etc  better tolerating thinks stress has been issue  Stress  Accreditation at work. But getting job interview.  No ew sx  Vaginal sx better after 3 day monisat also checked with gyne . Asks about Bg monitoring and what are normals . ROS: See pertinent positives and negatives per HPI. Gest problem with ears ? Fluid  Not a lot of pain got mixed messages from dentist office and ent about  Opacification of left max sinus no fever face pain at this time    Past Medical History  Diagnosis Date  . Hyperlipidemia   . Hypertension   . PCO (polycystic ovaries)   . Anal fissure   . Arthritis   . Allergy     History of shots as a child  . Rosacea   . History of infertility, female   . IBS (irritable bowel syndrome)     Family History  Problem Relation Age of Onset  . Hypertension    . Lung cancer      uncle  . Breast cancer      great aunt  . Hypertension Mother   . Heart disease Mother   . Hypertension Father   . Dementia Maternal Grandfather   . Heart disease Paternal Grandmother   . Dementia Paternal Grandfather     History   Social History  . Marital Status: Married    Spouse Name: N/A    Number of Children: 0  . Years of Education: N/A   Occupational History  . INFANT TEACHER    Social History Main Topics  . Smoking status: Former Smoker -- 0.50 packs/day for 7 years    Types: Cigarettes    Quit date: 12/13/1986  . Smokeless tobacco: None  . Alcohol Use: 0.0 oz/week    0 Glasses of wine per week     Comment: socially  . Drug Use: No  . Sexual Activity: None     Comment: married   Other Topics Concern  . 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    Outpatient Encounter Prescriptions  as of 06/03/2014  Medication Sig  . albuterol (PROVENTIL HFA;VENTOLIN HFA) 108 (90 BASE) MCG/ACT inhaler Inhale 2 puffs into the lungs every 6 (six) hours as needed for wheezing or shortness of breath.  . diltiazem (DILACOR XR) 240 MG 24 hr capsule TAKE ONE CAPSULE BY MOUTH DAILY  . fluconazole (DIFLUCAN) 150 MG tablet Take 1 tablet (150 mg total) by mouth once. Can repeat in 3 days  . fluticasone (FLONASE) 50 MCG/ACT nasal spray 2 sprays per nostril daily.  MUST HAVE OFFICE VISIT FOR FURTHER REFILLS.  . Lactobacillus (ACIDOPHILUS) 10 MG CAPS Take by mouth daily.   Marland Kitchen levonorgestrel (MIRENA) 20 MCG/24HR IUD 1 each by Intrauterine route once.    Marland Kitchen LORazepam (ATIVAN) 0.5 MG tablet Take 1 tablet (0.5 mg total) by mouth 2 (two) times daily as needed for anxiety.  Marland Kitchen losartan (COZAAR) 50 MG tablet TAKE 1 TABLET BY MOUTH EVERY DAY  . metroNIDAZOLE (FLAGYL) 500 MG tablet Take 1 tablet (500 mg total) by mouth 2 (two) times daily. If  needed for bacterial infection  . metroNIDAZOLE (METROGEL) 0.75 % gel Apply 1 application topically daily.  Marland Kitchen nystatin ointment (MYCOSTATIN) Apply topically as directed  . Zinc Oxide 30.6 % CREA Apply topically as needed.     EXAM:  BP 136/74  Temp(Src) 98 F (36.7 C) (Oral)  Ht 4' 11.25" (1.505 m)  Wt 173 lb (78.472 kg)  BMI 34.65 kg/m2  Body mass index is 34.65 kg/(m^2).  GENERAL: vitals reviewed and listed above, alert, oriented, appears well hydrated and in no acute distress HEENT: atraumatic, conjunctiva  clear, no obvious abnormalities on inspection of external nose and ears tms no acute findings  Mild nasal congestionNECK: no obvious masses on inspection palpation  LUNGS: clear to auscultation bilaterally, no wheezes, rales or rhonchi, CV: HRRR, no clubbing cyanosis or  peripheral edema nl cap refill  MS: moves all extremities without noticeable focal  abnormality PSYCH: pleasant and cooperative, no obvious depression or anxiety Lab Results  Component Value  Date   WBC 6.9 03/13/2014   HGB 14.1 03/13/2014   HCT 41.6 03/13/2014   PLT 230.0 03/13/2014   GLUCOSE 105* 03/13/2014   CHOL 190 03/13/2014   TRIG 92.0 03/13/2014   HDL 43.20 03/13/2014   LDLDIRECT 160.7 12/10/2010   LDLCALC 128* 03/13/2014   ALT 15 03/13/2014   AST 19 03/13/2014   NA 137 03/13/2014   K 4.1 03/13/2014   CL 102 03/13/2014   CREATININE 0.9 03/13/2014   BUN 15 03/13/2014   CO2 27 03/13/2014   TSH 1.10 03/13/2014   HGBA1C 5.9 03/13/2014   fsh normal ASSESSMENT AND PLAN:  Discussed the following assessment and plan:  Flushes  Unspecified essential hypertension  Heart palpitations  Polycystic ovaries All a bit better  Stress and other issues agravating  Disc diet monitorig etc   Ears porb ETD doubt sig sinus infection at this time disc  -Patient advised to return or notify health care team  if symptoms worsen ,persist or new concerns arise.  Patient Instructions  Can check fasting blood sugar and should be below 100 for normal 100-125 pre diabetes or glucose intolerance  Can also check ocass 2 hours after eating.    120 range   Some elevated .   200 and over is diabetes  . Pre diabetes in between.  Blood  pressure is better  Today . Consider  seeing Dr Thornell Mule eric if  Ears still a problem .   Standley Brooking. Panosh M.D.  Pre visit review using our clinic review tool, if applicable. No additional management support is needed unless otherwise documented below in the visit note.

## 2014-08-13 ENCOUNTER — Other Ambulatory Visit: Payer: Self-pay | Admitting: Internal Medicine

## 2014-08-13 NOTE — Telephone Encounter (Signed)
Sent to the pharmacy by e-scribe. 

## 2014-08-29 ENCOUNTER — Encounter: Payer: Self-pay | Admitting: Gastroenterology

## 2014-09-04 ENCOUNTER — Encounter: Payer: Self-pay | Admitting: Internal Medicine

## 2014-09-18 ENCOUNTER — Encounter: Payer: Self-pay | Admitting: Internal Medicine

## 2014-09-18 ENCOUNTER — Ambulatory Visit (INDEPENDENT_AMBULATORY_CARE_PROVIDER_SITE_OTHER): Payer: 59 | Admitting: Internal Medicine

## 2014-09-18 ENCOUNTER — Ambulatory Visit: Payer: 59 | Admitting: Internal Medicine

## 2014-09-18 VITALS — BP 124/80 | Temp 98.3°F | Wt 182.0 lb

## 2014-09-18 DIAGNOSIS — R400 Somnolence: Secondary | ICD-10-CM | POA: Insufficient documentation

## 2014-09-18 DIAGNOSIS — R2230 Localized swelling, mass and lump, unspecified upper limb: Secondary | ICD-10-CM | POA: Insufficient documentation

## 2014-09-18 DIAGNOSIS — R2232 Localized swelling, mass and lump, left upper limb: Secondary | ICD-10-CM

## 2014-09-18 DIAGNOSIS — I10 Essential (primary) hypertension: Secondary | ICD-10-CM

## 2014-09-18 NOTE — Patient Instructions (Signed)
You will be contacted about  ultrasound and mammogram and then next step if needed.   Contact us about time  So we can arrange GTT and lab including b12 level in November. Because of the fatigue after eating.

## 2014-09-18 NOTE — Progress Notes (Signed)
Chief Complaint  Patient presents with  . lump    left armpit    HPI: Patient Gail [name]  comes in today for SDA for  new problem evaluation. Dentist over the last weekend some fullness in her left axilla not really tender her husband checked it to him he felt it was different than the other side feels like an ache-like swelling in the axilla. Last mammogram was in January has friends and acquaintances. Younger than her with breast cancer. No systemic symptoms although does complain of fatigue after eating once her B12 level checked. Blood sugars have been okay in the past was really checking them now. She tries to do a lower carb diet and avoid the sweets and sugars. Blood pressure appears to have been good recently She had a question diagnosis of PCO by GYN but has not been on metformin. Father recently diagnosed with an illness with recurrent falling either B12 deficiency concern about ALS. ROS: See pertinent positives and negatives per HPI.  Past Medical History  Diagnosis Date  . Hyperlipidemia   . Hypertension   . PCO (polycystic ovaries)   . Anal fissure   . Arthritis   . Allergy     History of shots as a child  . Rosacea   . History of infertility, female   . IBS (irritable bowel syndrome)     Family History  Problem Relation Age of Onset  . Hypertension    . Lung cancer      uncle  . Breast cancer      great aunt  . Hypertension Mother   . Heart disease Mother   . Hypertension Father   . Dementia Maternal Grandfather   . Heart disease Paternal Grandmother   . Dementia Paternal Grandfather     History   Social History  . Marital Status: Married    Spouse Name: N/A    Number of Children: 0  . Years of Education: N/A   Occupational History  . INFANT TEACHER    Social History Main Topics  . Smoking status: Former Smoker -- 0.50 packs/day for 7 years    Types: Cigarettes    Quit date: 12/13/1986  . Smokeless tobacco: None  . Alcohol Use: 0.0  oz/week    0 Glasses of wine per week     Comment: socially  . Drug Use: No  . Sexual Activity: None     Comment: married   Other Topics Concern  . 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    Outpatient Encounter Prescriptions as of 09/18/2014  Medication Sig  . albuterol (PROVENTIL HFA;VENTOLIN HFA) 108 (90 BASE) MCG/ACT inhaler Inhale 2 puffs into the lungs every 6 (six) hours as needed for wheezing or shortness of breath.  . diltiazem (DILACOR XR) 240 MG 24 hr capsule TAKE ONE CAPSULE BY MOUTH DAILY  . fluticasone (FLONASE) 50 MCG/ACT nasal spray 2 sprays per nostril daily.  MUST HAVE OFFICE VISIT FOR FURTHER REFILLS.  . Lactobacillus (ACIDOPHILUS) 10 MG CAPS Take by mouth daily.   Marland Kitchen levonorgestrel (MIRENA) 20 MCG/24HR IUD 1 each by Intrauterine route once.    Marland Kitchen losartan (COZAAR) 50 MG tablet TAKE 1 TABLET BY MOUTH EVERY DAY  .  metroNIDAZOLE (METROGEL) 0.75 % gel Apply 1 application topically daily.  Marland Kitchen nystatin ointment (MYCOSTATIN) Apply topically as directed  . Zinc Oxide 30.6 % CREA Apply topically as needed.   . [DISCONTINUED] fluconazole (DIFLUCAN) 150 MG tablet Take 1 tablet (150 mg total) by mouth once. Can repeat in 3 days  . [DISCONTINUED] LORazepam (ATIVAN) 0.5 MG tablet Take 1 tablet (0.5 mg total) by mouth 2 (two) times daily as needed for anxiety.  . [DISCONTINUED] metroNIDAZOLE (FLAGYL) 500 MG tablet Take 1 tablet (500 mg total) by mouth 2 (two) times daily. If needed for bacterial infection    EXAM:  BP 124/80  Temp(Src) 98.3 F (36.8 C) (Oral)  Wt 182 lb (82.555 kg)  Body mass index is 36.45 kg/(m^2).  GENERAL: vitals reviewed and listed above, alert, oriented, appears well hydrated and in no acute distress HEENT: atraumatic, conjunctiva  clear, no obvious abnormalities on inspection of  external nose and earsNECK: no obvious masses on inspection palpation  Breast exam no obvious skin dimpling or nodules the left axilla looks fuller than the right and there is a mass affect about egg size really tender and can't really move it. No skin dimpling noted no discharge noted abdomen soft without organomegaly guarding or rebound CV: HRRR, no clubbing cyanosis or  peripheral edema nl cap refill  MS: moves all extremities without noticeable focal  abnormality PSYCH: pleasant and cooperative, no obvious depression or anxiety Lab Results  Component Value Date   WBC 6.9 03/13/2014   HGB 14.1 03/13/2014   HCT 41.6 03/13/2014   PLT 230.0 03/13/2014   GLUCOSE 105* 03/13/2014   CHOL 190 03/13/2014   TRIG 92.0 03/13/2014   HDL 43.20 03/13/2014   LDLDIRECT 160.7 12/10/2010   LDLCALC 128* 03/13/2014   ALT 15 03/13/2014   AST 19 03/13/2014   NA 137 03/13/2014   K 4.1 03/13/2014   CL 102 03/13/2014   CREATININE 0.9 03/13/2014   BUN 15 03/13/2014   CO2 27 03/13/2014   TSH 1.10 03/13/2014   HGBA1C 5.9 03/13/2014    ASSESSMENT AND PLAN:  Discussed the following assessment and plan:  Axillary lump, left - swelling check [place]  - Plan: US BREAST COMPLETE UNI LEFT INC AXILLA, MM Digital Diagnostic Bilat  Intermittent sleepiness - After eating when has time off consider glucose tolerance test avoid simple carbs doubt B12 but can order this for next lab drawn  Essential hypertension - appears controlled  -Patient advised to return or notify health care team  if symptoms worsen ,persist or new concerns arise.  Patient Instructions  You will be contacted about  ultrasound and mammogram and then next step if needed.   Contact us about time  So we can arrange GTT and lab including b12 level in November. Because of the fatigue after eating.    Standley Brooking. Abhiraj Dozal M.D.

## 2014-09-23 ENCOUNTER — Other Ambulatory Visit: Payer: Self-pay | Admitting: Cardiology

## 2014-09-27 ENCOUNTER — Ambulatory Visit
Admission: RE | Admit: 2014-09-27 | Discharge: 2014-09-27 | Disposition: A | Discharge status: Home / Self Care | Payer: 59 | Source: Ambulatory Visit | Attending: Internal Medicine | Admitting: Internal Medicine

## 2014-09-27 DIAGNOSIS — R2232 Localized swelling, mass and lump, left upper limb: Secondary | ICD-10-CM

## 2014-10-03 ENCOUNTER — Telehealth: Payer: Self-pay | Admitting: Family Medicine

## 2014-10-03 DIAGNOSIS — E282 Polycystic ovarian syndrome: Secondary | ICD-10-CM

## 2014-10-03 DIAGNOSIS — R5383 Other fatigue: Secondary | ICD-10-CM

## 2014-10-03 DIAGNOSIS — R739 Hyperglycemia, unspecified: Secondary | ICD-10-CM

## 2014-10-03 NOTE — Telephone Encounter (Signed)
Pt continues to have a lump under her arm.  Would like to know what is the next step?

## 2014-10-04 NOTE — Telephone Encounter (Signed)
At this time no need to do other evaluation . Ultrasound showed fatty tissue ( a common finding )and no mass.and no Lymph nodes . Would follow this with clinical exam  . If you are worried about this you could see a surgeon to feel this area But i advise  Just repeat exam in about 3 months or as needed .

## 2014-10-04 NOTE — Telephone Encounter (Signed)
Patient is aware of ultrasound results and would like to have her glucose testing done around November 18 if possible.

## 2014-10-07 NOTE — Telephone Encounter (Signed)
Pt needs GTT and B12.  What other labs are needed?  Also need dx code.

## 2014-10-08 ENCOUNTER — Other Ambulatory Visit: Payer: Self-pay | Admitting: Family Medicine

## 2014-10-08 NOTE — Telephone Encounter (Signed)
Hg a1c, also  Dx hyperglycemia PCOS dysmetabolic exam

## 2014-10-09 NOTE — Telephone Encounter (Signed)
Pt was given message and number to call [facility] for to set up lab appt. Pt verbalized understandingl/

## 2014-10-09 NOTE — Telephone Encounter (Signed)
Gail [name], please document that you spoke to the pt and gave her the information below.  Thanks!

## 2014-10-09 NOTE — Telephone Encounter (Signed)
Labs have been ordered for Gail [name].  Pt must call and make an appt for 3 hour gtt.  Direct number to the lab is [phone].  Left a message on patient's cell for her to return my call.

## 2014-10-16 ENCOUNTER — Telehealth: Payer: Self-pay | Admitting: Family Medicine

## 2014-10-16 MED ORDER — POLYMYXIN B-TRIMETHOPRIM 10000-0.1 UNIT/ML-% OP SOLN: 1.0000 [drp] | Freq: Four times a day (QID) | OPHTHALMIC | Status: DC

## 2014-10-16 NOTE — Telephone Encounter (Signed)
Patient notified to pick up rx at [facility] in [place].

## 2014-10-16 NOTE — Telephone Encounter (Signed)
Patient called and stated that she needs eye drops for pink eye.  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  This morning she woke with matted eyes that are pink colored and itching.  Please advise.  Thanks!  She would like the rx to be sent to [facility] in [place].

## 2014-10-30 ENCOUNTER — Other Ambulatory Visit (INDEPENDENT_AMBULATORY_CARE_PROVIDER_SITE_OTHER): Payer: 59

## 2014-10-30 DIAGNOSIS — R739 Hyperglycemia, unspecified: Secondary | ICD-10-CM

## 2014-10-30 DIAGNOSIS — E282 Polycystic ovarian syndrome: Secondary | ICD-10-CM

## 2014-10-30 DIAGNOSIS — R5383 Other fatigue: Secondary | ICD-10-CM

## 2014-10-30 LAB — GLUCOSE TOLERANCE, 3 HOURS
GLUCOSE 1 HOUR GTT: 192 mg/dL
GLUCOSE 3 HOUR GTT: 142 mg/dL
GLUCOSE, 2 HOUR: 157 mg/dL
Glucose, Fasting: 111 mg/dL — ABNORMAL HIGH (ref 70–99)

## 2014-10-30 LAB — HEMOGLOBIN A1C: HEMOGLOBIN A1C: 5.9 % (ref 4.6–6.5)

## 2014-10-30 LAB — VITAMIN B12: VITAMIN B 12: 217 pg/mL (ref 211–911)

## 2014-11-01 ENCOUNTER — Telehealth: Payer: Self-pay | Admitting: Internal Medicine

## 2014-11-01 MED ORDER — METFORMIN HCL ER (MOD) 500 MG PO TB24: 500.0000 mg | ORAL_TABLET | Freq: Every day | ORAL | Status: DC

## 2014-11-01 NOTE — Telephone Encounter (Signed)
If patient ok with this   Begin metformin 500 mg ER  1 po qd disp  30 refill x 3

## 2014-11-01 NOTE — Telephone Encounter (Signed)
LM on voicemail informing the pt that the prescription has been sent to the pharmacy.

## 2014-11-01 NOTE — Telephone Encounter (Signed)
Pt called to ask if there will be a rx called in for her to try the  Metformin or does she need an appointment first.   Pharmacy; [facility]

## 2014-11-01 NOTE — Telephone Encounter (Signed)
Would you like for the pt to start on metformin over the weekend?

## 2014-11-04 ENCOUNTER — Ambulatory Visit (INDEPENDENT_AMBULATORY_CARE_PROVIDER_SITE_OTHER): Payer: 59 | Admitting: Internal Medicine

## 2014-11-04 ENCOUNTER — Encounter: Payer: Self-pay | Admitting: Internal Medicine

## 2014-11-04 VITALS — BP 136/74 | Temp 97.9°F | Ht 59.25 in | Wt 185.0 lb

## 2014-11-04 DIAGNOSIS — R7309 Other abnormal glucose: Secondary | ICD-10-CM

## 2014-11-04 DIAGNOSIS — R7302 Impaired glucose tolerance (oral): Secondary | ICD-10-CM

## 2014-11-04 DIAGNOSIS — Z6379 Other stressful life events affecting family and household: Secondary | ICD-10-CM

## 2014-11-04 DIAGNOSIS — E538 Deficiency of other specified B group vitamins: Secondary | ICD-10-CM

## 2014-11-04 MED ORDER — NYSTATIN 100000 UNIT/GM EX OINT: TOPICAL_OINTMENT | CUTANEOUS | Status: DC

## 2014-11-04 MED ORDER — METFORMIN HCL 500 MG PO TABS: 500.0000 mg | ORAL_TABLET | Freq: Every day | ORAL | Status: DC

## 2014-11-04 NOTE — Progress Notes (Signed)
Pre visit review using our clinic review tool, if applicable. No additional management support is needed unless otherwise documented below in the visit note.   Chief Complaint  Patient presents with  . Follow-up    HPI: Gail [name] 48 y.o.    Comes in for fu of GTT  See notes  Felt tired right after sugar load reminiscent of sick feeling.    Metformin rx not available yet. So hasn;t started . Other stresses  Father ALS and low B12  Getting injections but declining and she is only child .  ROS: See pertinent positives and negatives per HPI.  Past Medical History  Diagnosis Date  . Hyperlipidemia   . Hypertension   . PCO (polycystic ovaries)   . Anal fissure   . Arthritis   . Allergy     History of shots as a child  . Rosacea   . History of infertility, female   . IBS (irritable bowel syndrome)     Family History  Problem Relation Age of Onset  . Hypertension    . Lung cancer      uncle  . Breast cancer      great aunt  . Hypertension Mother   . Heart disease Mother   . Hypertension Father   . Dementia Maternal Grandfather   . Heart disease Paternal Grandmother   . Dementia Paternal Grandfather     History   Social History  . Marital Status: Married    Spouse Name: N/A    Number of Children: 0  . Years of Education: N/A   Occupational History  . INFANT TEACHER    Social History Main Topics  . Smoking status: Former Smoker -- 0.50 packs/day for 7 years    Types: Cigarettes    Quit date: 12/13/1986  . Smokeless tobacco: None  . Alcohol Use: 0.0 oz/week    0 Glasses of wine per week     Comment: socially  . Drug Use: No  . Sexual Activity: None     Comment: married   Other Topics Concern  . 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    Outpatient Encounter Prescriptions as  of 11/04/2014  Medication Sig  . albuterol (PROVENTIL HFA;VENTOLIN HFA) 108 (90 BASE) MCG/ACT inhaler Inhale 2 puffs into the lungs every 6 (six) hours as needed for wheezing or shortness of breath.  Marland Kitchen DILT-XR 240 MG 24 hr capsule TAKE ONE CAPSULE BY MOUTH EVERY DAY  . fluticasone (FLONASE) 50 MCG/ACT nasal spray 2 sprays per nostril daily.  MUST HAVE OFFICE VISIT FOR FURTHER REFILLS.  . Lactobacillus (ACIDOPHILUS) 10 MG CAPS Take by mouth daily.   Marland Kitchen levonorgestrel (MIRENA) 20 MCG/24HR IUD 1 each by Intrauterine route once.    Marland Kitchen losartan (COZAAR) 50 MG tablet TAKE 1 TABLET BY MOUTH EVERY DAY  . metFORMIN (GLUCOPHAGE) 500 MG tablet Take 1 tablet (500 mg total) by mouth daily with breakfast.  . metroNIDAZOLE (METROGEL) 0.75 % gel Apply 1 application topically daily.  Marland Kitchen nystatin ointment (MYCOSTATIN) Apply topically as directed  . trimethoprim-polymyxin b (POLYTRIM) ophthalmic solution Place 1 drop into both eyes every 6 (six) hours.  . Zinc Oxide 30.6 % CREA Apply topically as needed.   . [  DISCONTINUED] metFORMIN (GLUCOPHAGE) 500 MG tablet Take 500 mg by mouth daily with breakfast.  . [DISCONTINUED] metFORMIN (GLUMETZA) 500 MG (MOD) 24 hr tablet Take 1 tablet (500 mg total) by mouth daily with breakfast.  . [DISCONTINUED] nystatin ointment (MYCOSTATIN) Apply topically as directed    EXAM:  BP 136/74 mmHg  Temp(Src) 97.9 F (36.6 C) (Oral)  Ht 4' 11.25" (1.505 m)  Wt 185 lb (83.915 kg)  BMI 37.05 kg/m2  Body mass index is 37.05 kg/(m^2).  GENERAL: vitals reviewed and listed above, alert, oriented, appears well hydrated and in no acute distress PSYCH: pleasant and cooperative, no obvious depression or anxiety slihglty emotional when speaking of fathers decline and illness    Ref Range 5d ago    Glucose, Fasting 70 - 99 mg/dL 111 (H)   Glucose, 1 Hour GTT mg/dL 192   Glucose, 2 hour mg/dL 157   Glucose, GTT - 3 Hour mg/dL 142   Resulting Agency [facility]    Specimen  Collected: 10/30/14 7:44 AM Last Resulted: 10/30/14 2:50 PM         Ref Range 5d ago    Vitamin B-12 211 - 911 pg/mL 217        ASSESSMENT AND PLAN:  Discussed the following assessment and plan:  Abnormal glucose tolerance test  Low normal vitamin B12 level - add supp and follow   Stress due to illness of family member - father new dx ALS and declining Low Z61 level  Certainly  Some of the cause of her PP sx .; begin metformin disc diet low glycemic index and getting back with nutritonist.  Add b12 1000 per day  And plan fu   If remain low consider injectable     Plan labs next visit  b12 mma  Etc  -Patient advised to return or notify health care team  if symptoms worsen ,persist or new concerns arise.  Patient Instructions  Begin metformin 500 mg per day  With meal   We can increase  If needed .    Dose    ROV in 1 to 2  Months    Week before visit then check fasting BG and 2 hours after eating a meal  . Contact us in interim if needed.   Add b12  1000 mg per day  And we can fu level when comes back  If not helping we may .    Do other interventions.     Standley Brooking. Panosh M.D. Total visit 55mins > 50% spent counseling and coordinating care

## 2014-11-04 NOTE — Patient Instructions (Addendum)
Begin metformin 500 mg per day  With meal   We can increase  If needed .    Dose    ROV in 1 to 2  Months    Week before visit then check fasting BG and 2 hours after eating a meal  . Contact us in interim if needed.   Add b12  1000 mg per day  And we can fu level when comes back  If not helping we may .    Do other interventions.

## 2014-11-05 ENCOUNTER — Telehealth: Payer: Self-pay | Admitting: Internal Medicine

## 2014-11-05 NOTE — Telephone Encounter (Signed)
Noted.  Patient's medication has been changed to metFORMIN (GLUCOPHAGE) 500 MG tablet. Thanks!!

## 2014-11-05 NOTE — Telephone Encounter (Signed)
PA for Gail [name] was denied.  Patient must have tried and failed all of the following:  Generic Glucophage XR and generic Fortamet.  Documentation must be submitted supporting an intolerance to the medications.

## 2014-12-25 ENCOUNTER — Ambulatory Visit (INDEPENDENT_AMBULATORY_CARE_PROVIDER_SITE_OTHER): Payer: 59 | Admitting: Family Medicine

## 2014-12-25 ENCOUNTER — Encounter: Payer: Self-pay | Admitting: Family Medicine

## 2014-12-25 VITALS — BP 129/81 | HR 103 | Temp 98.6°F | Ht 59.25 in | Wt 184.0 lb

## 2014-12-25 DIAGNOSIS — J0100 Acute maxillary sinusitis, unspecified: Secondary | ICD-10-CM

## 2014-12-25 MED ORDER — HYDROCODONE-HOMATROPINE 5-1.5 MG/5ML PO SYRP: 5.0000 mL | ORAL_SOLUTION | ORAL | Status: DC | PRN

## 2014-12-25 MED ORDER — AMOXICILLIN-POT CLAVULANATE 875-125 MG PO TABS: 1.0000 | ORAL_TABLET | Freq: Two times a day (BID) | ORAL | Status: DC

## 2014-12-25 NOTE — Progress Notes (Signed)
   Subjective:    Patient ID: Gail [name], female    DOB: 08-Jun-1966, 49 y.o.   MRN: [id number]  HPI Here for one week of sinus pressure, PND, pressure around the left cheek, PND and coughing up green sputum. She has been diagnosed with chronic sinus disease by her dentist, who saw this on dental Xrays recently. She is scheduled to see an ENT by the name of Dr. Dimas Millin in [place] on 01-03-15.    Review of Systems  Constitutional: Negative.   HENT: Positive for congestion, postnasal drip and sinus pressure.   Eyes: Negative.   Respiratory: Positive for cough.        Objective:   Physical Exam  Constitutional: She appears well-developed and well-nourished.  HENT:  Right Ear: External ear normal.  Left Ear: External ear normal.  Nose: Nose normal.  Mouth/Throat: Oropharynx is clear and moist.  Eyes: Conjunctivae are normal.  Pulmonary/Chest: Effort normal and breath sounds normal.  Lymphadenopathy:    She has no cervical adenopathy.          Assessment & Plan:  Add Mucinex.

## 2014-12-25 NOTE — Progress Notes (Signed)
Pre visit review using our clinic review tool, if applicable. No additional management support is needed unless otherwise documented below in the visit note. 

## 2015-01-01 ENCOUNTER — Telehealth: Payer: Self-pay | Admitting: Family Medicine

## 2015-01-01 MED ORDER — FLUCONAZOLE 150 MG PO TABS: 150.0000 mg | ORAL_TABLET | Freq: Once | ORAL | Status: DC

## 2015-01-01 NOTE — Telephone Encounter (Signed)
Call in Diflucan 150 mg #1 with 2 refills

## 2015-01-01 NOTE — Telephone Encounter (Signed)
Pt called to report she has a yeast infection due to taking amoxicillin-clavulanate (AUGMENTIN) 875-125 MG per tablet.  Pt is requesting fluconazole. [facility] in [place].

## 2015-01-01 NOTE — Telephone Encounter (Signed)
I sent script e-scribe and spoke with pt. 

## 2015-01-06 ENCOUNTER — Ambulatory Visit: Payer: 59 | Admitting: Internal Medicine

## 2015-01-15 ENCOUNTER — Ambulatory Visit (INDEPENDENT_AMBULATORY_CARE_PROVIDER_SITE_OTHER): Payer: 59 | Admitting: Internal Medicine

## 2015-01-15 ENCOUNTER — Encounter: Payer: Self-pay | Admitting: Internal Medicine

## 2015-01-15 VITALS — BP 140/82 | Temp 97.9°F | Ht 59.25 in | Wt 183.3 lb

## 2015-01-15 DIAGNOSIS — R7309 Other abnormal glucose: Secondary | ICD-10-CM

## 2015-01-15 DIAGNOSIS — Z6379 Other stressful life events affecting family and household: Secondary | ICD-10-CM

## 2015-01-15 DIAGNOSIS — E282 Polycystic ovarian syndrome: Secondary | ICD-10-CM

## 2015-01-15 DIAGNOSIS — R7302 Impaired glucose tolerance (oral): Secondary | ICD-10-CM

## 2015-01-15 DIAGNOSIS — R5383 Other fatigue: Secondary | ICD-10-CM

## 2015-01-15 DIAGNOSIS — Z79899 Other long term (current) drug therapy: Secondary | ICD-10-CM

## 2015-01-15 NOTE — Progress Notes (Signed)
Pre visit review using our clinic review tool, if applicable. No additional management support is needed unless otherwise documented below in the visit note.  Chief Complaint  Patient presents with  . Follow-up    HPI: Gail [name] 49 y.o.   Hx of insuln resistance HT abnormal gtt recnetly placed on metformin  Interim sinus infection jan rx with augmentin . metalic in  tase in mouth  .   Still tired after eat in afternooon .  But  Busy  Father now dx confirmed with ALS and helping  2 days per week .   Still working  Trying to eat better and less less hungry n syncope . ocass cp palpitations. b12   After  Metformin  ROS: See pertinent positives and negatives per HPI.    Past Medical History  Diagnosis Date  . Hyperlipidemia   . Hypertension   . PCO (polycystic ovaries)   . Anal fissure   . Arthritis   . Allergy     History of shots as a child  . Rosacea   . History of infertility, female   . IBS (irritable bowel syndrome)     Family History  Problem Relation Age of Onset  . Hypertension    . Lung cancer      uncle  . Breast cancer      great aunt  . Hypertension Mother   . Heart disease Mother   . Hypertension Father   . Dementia Maternal Grandfather   . Heart disease Paternal Grandmother   . Dementia Paternal Grandfather     History   Social History  . Marital Status: Married    Spouse Name: N/A    Number of Children: 0  . Years of Education: N/A   Occupational History  . INFANT TEACHER    Social History Main Topics  . Smoking status: Former Smoker -- 0.50 packs/day for 7 years    Types: Cigarettes    Quit date: 12/13/1986  . Smokeless tobacco: Never Used  . Alcohol Use: No  . Drug Use: No  . Sexual Activity: None     Comment: married   Other Topics Concern  . 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    Outpatient Encounter Prescriptions as of 01/15/2015  Medication Sig  . DILT-XR 240 MG 24 hr capsule TAKE ONE CAPSULE BY MOUTH EVERY DAY  . fluconazole (DIFLUCAN) 150 MG tablet Take 1 tablet (150 mg total) by mouth once.  . fluticasone (FLONASE) 50 MCG/ACT nasal spray 2 sprays per nostril daily.  MUST HAVE OFFICE VISIT FOR FURTHER REFILLS.  . Lactobacillus (ACIDOPHILUS) 10 MG CAPS Take by mouth daily.   Marland Kitchen levonorgestrel (MIRENA) 20 MCG/24HR IUD 1 each by Intrauterine route once.    Marland Kitchen losartan (COZAAR) 50 MG tablet TAKE 1 TABLET BY MOUTH EVERY DAY  . metFORMIN (GLUCOPHAGE) 500 MG tablet Take 1 tablet (500 mg total) by mouth daily with breakfast.  . metroNIDAZOLE (METROGEL) 0.75 % gel Apply 1 application topically daily.  Marland Kitchen nystatin ointment (MYCOSTATIN) Apply topically as directed  . Zinc Oxide 30.6 % CREA Apply topically as needed.   . RESTASIS 0.05 % ophthalmic emulsion   . [DISCONTINUED] albuterol (PROVENTIL HFA;VENTOLIN HFA)  108 (90 BASE) MCG/ACT inhaler Inhale 2 puffs into the lungs every 6 (six) hours as needed for wheezing or shortness of breath. (Patient not taking: Reported on 12/25/2014)  . [DISCONTINUED] amoxicillin-clavulanate (AUGMENTIN) 875-125 MG per tablet Take 1 tablet by mouth 2 (two) times daily.  . [DISCONTINUED] HYDROcodone-homatropine (HYDROMET) 5-1.5 MG/5ML syrup Take 5 mLs by mouth every 4 (four) hours as needed.  . [DISCONTINUED] trimethoprim-polymyxin b (POLYTRIM) ophthalmic solution Place 1 drop into both eyes every 6 (six) hours. (Patient not taking: Reported on 12/25/2014)    EXAM:  BP 140/82 mmHg  Temp(Src) 97.9 F (36.6 C) (Oral)  Ht 4' 11.25" (1.505 m)  Wt 183 lb 4.8 oz (83.144 kg)  BMI 36.71 kg/m2  Body mass index is 36.71 kg/(m^2).  GENERAL: vitals reviewed and listed above, alert, oriented, appears well hydrated and in no acute distress HEENT: atraumatic, conjunctiva  clear, no obvious abnormalities on inspection of  external nose and ears  NECK: no obvious masses on inspection palpation  CV: HRRR, no g or m  no clubbing cyanosis or  peripheral edema nl cap refill  MS: moves all extremities without noticeable focal  abnormality PSYCH: pleasant and cooperative,  Lab Results  Component Value Date   WBC 6.9 03/13/2014   HGB 14.1 03/13/2014   HCT 41.6 03/13/2014   PLT 230.0 03/13/2014   GLUCOSE 105* 03/13/2014   CHOL 190 03/13/2014   TRIG 92.0 03/13/2014   HDL 43.20 03/13/2014   LDLDIRECT 160.7 12/10/2010   LDLCALC 128* 03/13/2014   ALT 15 03/13/2014   AST 19 03/13/2014   NA 137 03/13/2014   K 4.1 03/13/2014   CL 102 03/13/2014   CREATININE 0.9 03/13/2014   BUN 15 03/13/2014   CO2 27 03/13/2014   TSH 1.10 03/13/2014   HGBA1C 5.9 10/30/2014    ASSESSMENT AND PLAN:  Discussed the following assessment and plan:  PCOS (polycystic ovarian syndrome)  Abnormal glucose tolerance test  Other fatigue - mutifactoria   Medication management  Stress due to illness of family member - father  with als Was unable to get bg  Will check soon and diet change    Handling stress pretty well but could cause fatigue.  -Patient advised to return or notify health care team  if symptoms worsen ,persist or new concerns arise. In the interim  Total visit 71mins > 50% spent counseling and coordinating care   Patient Instructions  Ok to take with largest meal  Evening is ok. and also b12 later. Take fasting BG and then 2 hours after eating  2 days a week and let us know results after a few weeks.    Depending  results consider increasing  Medications.   And plan follow up.   And ROV in 3 months     Standley Brooking. Panosh M.D. Wt Readings from Last 3 Encounters:  01/15/15 183 lb 4.8 oz (83.144 kg)  12/25/14 184 lb (83.462 kg)  11/04/14 185 lb (83.915 kg)

## 2015-01-15 NOTE — Patient Instructions (Signed)
Ok to take with largest meal  Evening is ok. and also b12 later. Take fasting BG and then 2 hours after eating  2 days a week and let us know results after a few weeks.    Depending  results consider increasing  Medications.   And plan follow up.   And ROV in 3 months

## 2015-01-31 ENCOUNTER — Other Ambulatory Visit: Payer: Self-pay | Admitting: Internal Medicine

## 2015-01-31 NOTE — Telephone Encounter (Signed)
Sent to the pharmacy by e-scribe. 

## 2015-03-05 ENCOUNTER — Other Ambulatory Visit: Payer: Self-pay | Admitting: Internal Medicine

## 2015-03-05 NOTE — Telephone Encounter (Signed)
Sent to the pharmacy by e-scribe. 

## 2015-05-18 ENCOUNTER — Other Ambulatory Visit: Payer: Self-pay | Admitting: Internal Medicine

## 2015-05-21 NOTE — Telephone Encounter (Signed)
Appears to be due for lab work.

## 2015-05-21 NOTE — Telephone Encounter (Signed)
Agree lab monitoring due   Lab Results  Component Value Date   WBC 6.9 03/13/2014   HGB 14.1 03/13/2014   HCT 41.6 03/13/2014   PLT 230.0 03/13/2014   GLUCOSE 105* 03/13/2014   CHOL 190 03/13/2014   TRIG 92.0 03/13/2014   HDL 43.20 03/13/2014   LDLDIRECT 160.7 12/10/2010   LDLCALC 128* 03/13/2014   ALT 15 03/13/2014   AST 19 03/13/2014   NA 137 03/13/2014   K 4.1 03/13/2014   CL 102 03/13/2014   CREATININE 0.9 03/13/2014   BUN 15 03/13/2014   CO2 27 03/13/2014   TSH 1.10 03/13/2014   HGBA1C 5.9 10/30/2014     Have her schedule for cpx labs and hg a1c  And cpx or OV  Refill med until done

## 2015-05-22 ENCOUNTER — Other Ambulatory Visit: Payer: Self-pay | Admitting: Family Medicine

## 2015-05-22 ENCOUNTER — Other Ambulatory Visit: Payer: Self-pay | Admitting: Internal Medicine

## 2015-05-22 ENCOUNTER — Telehealth: Payer: Self-pay | Admitting: Family Medicine

## 2015-05-22 DIAGNOSIS — R739 Hyperglycemia, unspecified: Secondary | ICD-10-CM

## 2015-05-22 DIAGNOSIS — Z0000 Encounter for general adult medical examination without abnormal findings: Secondary | ICD-10-CM

## 2015-05-22 NOTE — Telephone Encounter (Signed)
lmom for pt to call back

## 2015-05-22 NOTE — Telephone Encounter (Signed)
Pt has been sch

## 2015-05-22 NOTE — Telephone Encounter (Signed)
Sent to the pharmacy by e-scribe. 

## 2015-05-22 NOTE — Telephone Encounter (Signed)
Please schedule this pt for CPX and lab work in the next 1-2 months (please do something special).  I have placed the lab work orders.  Thanks!

## 2015-06-03 ENCOUNTER — Other Ambulatory Visit: Payer: Self-pay | Admitting: Internal Medicine

## 2015-06-03 NOTE — Telephone Encounter (Signed)
Sent to the pharmacy by e-scribe.  Pt has cpx on 08/08/15

## 2015-07-02 ENCOUNTER — Telehealth: Payer: Self-pay | Admitting: Family Medicine

## 2015-07-02 NOTE — Telephone Encounter (Signed)
Pt called and left a message on my machine.  She would like to schedule an appointment next week with Dr. Regis Bill to discuss metformin.  Also need a bp check.  Please help the pt to schedule this appointment.  Thanks!

## 2015-07-03 NOTE — Telephone Encounter (Signed)
Called pt, lvm to call back and sched appt with Panosh next week

## 2015-07-04 NOTE — Telephone Encounter (Signed)
Pt sched appt for Monday 7/25 @ 4:15p

## 2015-07-07 ENCOUNTER — Encounter: Payer: Self-pay | Admitting: Internal Medicine

## 2015-07-07 ENCOUNTER — Ambulatory Visit (INDEPENDENT_AMBULATORY_CARE_PROVIDER_SITE_OTHER): Payer: 59 | Admitting: Internal Medicine

## 2015-07-07 VITALS — BP 120/78 | Temp 97.5°F | Ht 59.5 in | Wt 180.0 lb

## 2015-07-07 DIAGNOSIS — E282 Polycystic ovarian syndrome: Secondary | ICD-10-CM

## 2015-07-07 DIAGNOSIS — R5383 Other fatigue: Secondary | ICD-10-CM

## 2015-07-07 DIAGNOSIS — Z634 Disappearance and death of family member: Secondary | ICD-10-CM

## 2015-07-07 DIAGNOSIS — I10 Essential (primary) hypertension: Secondary | ICD-10-CM

## 2015-07-07 DIAGNOSIS — Z79899 Other long term (current) drug therapy: Secondary | ICD-10-CM

## 2015-07-07 DIAGNOSIS — E538 Deficiency of other specified B group vitamins: Secondary | ICD-10-CM | POA: Insufficient documentation

## 2015-07-07 DIAGNOSIS — Z789 Other specified health status: Secondary | ICD-10-CM | POA: Diagnosis not present

## 2015-07-07 NOTE — Progress Notes (Signed)
Pre visit review using our clinic review tool, if applicable. No additional management support is needed unless otherwise documented below in the visit note.  Chief Complaint  Patient presents with  . Follow-up    HPI: Gail [name] 49 y.o.  comes in for chronic disease/ medication management    Father passed June 3  als  In grief counseling every 2 weeks.  HOspice helps   Metformin at night after eating  ocass  Get stomach  ache .  Not checking as much as usual but has readings to review  Still feels tired   Take small nap n weekends  swomss check ear  ROS: See pertinent positives and negatives per HPI. No cps sob recnet palpitations no neuro sx   Past Medical History  Diagnosis Date  . Hyperlipidemia   . Hypertension   . PCO (polycystic ovaries)   . Anal fissure   . Arthritis   . Allergy     History of shots as a child  . Rosacea   . History of infertility, female   . IBS (irritable bowel syndrome)     Family History  Problem Relation Age of Onset  . Hypertension    . Lung cancer      uncle  . Breast cancer      great aunt  . Hypertension Mother   . Heart disease Mother   . Hypertension Father   . Dementia Maternal Grandfather   . Heart disease Paternal Grandmother   . Dementia Paternal Grandfather     History   Social History  . Marital Status: Married    Spouse Name: N/A  . Number of Children: 0  . Years of Education: N/A   Occupational History  . INFANT TEACHER    Social History Main Topics  . Smoking status: Former Smoker -- 0.50 packs/day for 7 years    Types: Cigarettes    Quit date: 12/13/1986  . Smokeless tobacco: Never Used  . Alcohol Use: No  . Drug Use: No  . Sexual Activity: Not on file     Comment: married   Other Topics Concern  . 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    Outpatient Prescriptions Prior to Visit  Medication Sig Dispense Refill  . DILT-XR 240 MG 24 hr capsule TAKE ONE CAPSULE BY MOUTH EVERY DAY 30 capsule 12  . fluconazole (DIFLUCAN) 150 MG tablet Take 1 tablet (150 mg total) by mouth once. 1 tablet 2  . fluticasone (FLONASE) 50 MCG/ACT nasal spray 2 sprays per nostril daily.  MUST HAVE OFFICE VISIT FOR FURTHER REFILLS. 16 g 0  . Lactobacillus (ACIDOPHILUS) 10 MG CAPS Take by mouth daily.     Marland Kitchen levonorgestrel (MIRENA) 20 MCG/24HR IUD 1 each by Intrauterine route once.      Marland Kitchen losartan (COZAAR) 50 MG tablet TAKE 1 TABLET BY MOUTH EVERY DAY 90 tablet 0  . metFORMIN (GLUCOPHAGE) 500 MG tablet TAKE 1 TABLET BY MOUTH EVERY MORNING WITH BREAKFAST 90 tablet 0  . metroNIDAZOLE (METROGEL) 0.75 % gel Apply 1 application topically daily. 45 g 2  . nystatin ointment (MYCOSTATIN) Apply topically as directed 30 g 1  . RESTASIS 0.05 % ophthalmic emulsion     .  Zinc Oxide 30.6 % CREA Apply topically as needed.      No facility-administered medications prior to visit.     EXAM:  BP 120/78 mmHg  Temp(Src) 97.5 F (36.4 C) (Oral)  Ht 4' 11.5" (1.511 m)  Wt 180 lb (81.647 kg)  BMI 35.76 kg/m2  Body mass index is 35.76 kg/(m^2).  GENERAL: vitals reviewed and listed above, alert, oriented, appears well hydrated and in no acute distress HEENT: atraumatic, conjunctiva  clear, no obvious abnormalities on inspection of external nose and ears tms nl mild erythema left ear no infectionNECK: no obvious masses on inspection palpation  LUNGS: clear to auscultation bilaterally, no wheezes, rales or rhonchi, good air movement CV: HRRR, no clubbing cyanosis or  peripheral edema nl cap refill   Short sem usb not radaition MS: moves all extremities without noticeable focal  abnormality PSYCH: pleasant and cooperative,  Mood as expected  Good eye contact  Lab Results  Component Value Date   WBC 6.9 03/13/2014   HGB 14.1 03/13/2014   HCT 41.6  03/13/2014   PLT 230.0 03/13/2014   GLUCOSE 105* 03/13/2014   CHOL 190 03/13/2014   TRIG 92.0 03/13/2014   HDL 43.20 03/13/2014   LDLDIRECT 160.7 12/10/2010   LDLCALC 128* 03/13/2014   ALT 15 03/13/2014   AST 19 03/13/2014   NA 137 03/13/2014   K 4.1 03/13/2014   CL 102 03/13/2014   CREATININE 0.9 03/13/2014   BUN 15 03/13/2014   CO2 27 03/13/2014   TSH 1.10 03/13/2014   HGBA1C 5.9 10/30/2014   BP Readings from Last 3 Encounters:  07/07/15 120/78  01/15/15 140/82  12/25/14 129/81   Wt Readings from Last 3 Encounters:  07/07/15 180 lb (81.647 kg)  01/15/15 183 lb 4.8 oz (83.144 kg)  12/25/14 184 lb (83.462 kg)     ASSESSMENT AND PLAN:  Discussed the following assessment and plan:  Other fatigue  PCOS (polycystic ovarian syndrome)  Loss or death of family member - father als June  Recent bereavement  Medication management  Low normal vitamin B12 level - check level next labs   Essential hypertension   add b12 to cpx labs  Total visit 76mins > 50% spent counseling and coordinating care as indicated in above note and in instructions to patient .  Counseled. -Patient advised to return or notify health care team  if symptoms worsen ,persist or new concerns arise.  Patient Instructions  Add b12  Oral 1000 per day.  And  Plan add b12 level to your labs.    Fasting   Can take the metformin  Lunch or dinner .   Blood pressure is good today.   Continue with grief counseling.   Can try  Swimmers ear prevention drops.  Continue  Healthy weight loss       Standley Brooking. Larinda Herter M.D.

## 2015-07-07 NOTE — Patient Instructions (Signed)
Add b12  Oral 1000 per day.  And  Plan add b12 level to your labs.    Fasting   Can take the metformin  Lunch or dinner .   Blood pressure is good today.   Continue with grief counseling.   Can try  Swimmers ear prevention drops.  Continue  Healthy weight loss

## 2015-07-14 ENCOUNTER — Other Ambulatory Visit: Payer: Self-pay | Admitting: Internal Medicine

## 2015-07-15 NOTE — Telephone Encounter (Signed)
Request is too early.  No due until Sept.

## 2015-07-18 ENCOUNTER — Other Ambulatory Visit: Payer: Self-pay | Admitting: Internal Medicine

## 2015-07-21 NOTE — Telephone Encounter (Signed)
Sent to the pharmacy by e-scribe. 

## 2015-07-25 ENCOUNTER — Other Ambulatory Visit (INDEPENDENT_AMBULATORY_CARE_PROVIDER_SITE_OTHER): Payer: 59

## 2015-07-25 DIAGNOSIS — Z0000 Encounter for general adult medical examination without abnormal findings: Secondary | ICD-10-CM | POA: Diagnosis not present

## 2015-07-25 DIAGNOSIS — E538 Deficiency of other specified B group vitamins: Secondary | ICD-10-CM

## 2015-07-25 DIAGNOSIS — R739 Hyperglycemia, unspecified: Secondary | ICD-10-CM

## 2015-07-25 LAB — BASIC METABOLIC PANEL
BUN: 16 mg/dL (ref 6–23)
CHLORIDE: 105 meq/L (ref 96–112)
CO2: 25 meq/L (ref 19–32)
Calcium: 9 mg/dL (ref 8.4–10.5)
Creatinine, Ser: 0.77 mg/dL (ref 0.40–1.20)
GFR: 85.44 mL/min (ref >60.00)
GLUCOSE: 106 mg/dL — AB (ref 70–99)
Potassium: 4.2 mEq/L (ref 3.5–5.1)
Sodium: 139 mEq/L (ref 135–145)

## 2015-07-25 LAB — HEPATIC FUNCTION PANEL
ALBUMIN: 4.1 g/dL (ref 3.5–5.2)
ALT: 12 U/L (ref 0–35)
AST: 16 U/L (ref 0–37)
Alkaline Phosphatase: 75 U/L (ref 39–117)
Bilirubin, Direct: 0 mg/dL (ref 0.0–0.3)
Total Bilirubin: 0.5 mg/dL (ref 0.2–1.2)
Total Protein: 7.1 g/dL (ref 6.0–8.3)

## 2015-07-25 LAB — LIPID PANEL
Cholesterol: 220 mg/dL — ABNORMAL HIGH (ref 0–200)
HDL: 53.3 mg/dL (ref >39.00)
LDL Cholesterol: 152 mg/dL — ABNORMAL HIGH (ref 0–99)
NONHDL: 166.98
Total CHOL/HDL Ratio: 4
Triglycerides: 75 mg/dL (ref 0.0–149.0)
VLDL: 15 mg/dL (ref 0.0–40.0)

## 2015-07-25 LAB — CBC WITH DIFFERENTIAL/PLATELET
Basophils Absolute: 0 10*3/uL (ref 0.0–0.1)
Basophils Relative: 0.6 % (ref 0.0–3.0)
EOS PCT: 4.4 % (ref 0.0–5.0)
Eosinophils Absolute: 0.3 10*3/uL (ref 0.0–0.7)
HEMATOCRIT: 42.7 % (ref 36.0–46.0)
Hemoglobin: 14.3 g/dL (ref 12.0–15.0)
Lymphocytes Relative: 25.7 % (ref 12.0–46.0)
Lymphs Abs: 1.5 10*3/uL (ref 0.7–4.0)
MCHC: 33.5 g/dL (ref 30.0–36.0)
MCV: 94 fl (ref 78.0–100.0)
MONOS PCT: 8.5 % (ref 3.0–12.0)
Monocytes Absolute: 0.5 10*3/uL (ref 0.1–1.0)
NEUTROS PCT: 60.8 % (ref 43.0–77.0)
Neutro Abs: 3.7 10*3/uL (ref 1.4–7.7)
Platelets: 232 10*3/uL (ref 150.0–400.0)
RBC: 4.54 Mil/uL (ref 3.87–5.11)
RDW: 12.9 % (ref 11.5–15.5)
WBC: 6 10*3/uL (ref 4.0–10.5)

## 2015-07-25 LAB — TSH: TSH: 2.31 u[IU]/mL (ref 0.35–4.50)

## 2015-07-25 LAB — VITAMIN B12: Vitamin B-12: 469 pg/mL (ref 211–911)

## 2015-07-25 LAB — HEMOGLOBIN A1C: Hgb A1c MFr Bld: 5.8 % (ref 4.6–6.5)

## 2015-08-01 ENCOUNTER — Other Ambulatory Visit: Payer: 59

## 2015-08-08 ENCOUNTER — Encounter: Payer: 59 | Admitting: Internal Medicine

## 2015-08-11 ENCOUNTER — Ambulatory Visit (INDEPENDENT_AMBULATORY_CARE_PROVIDER_SITE_OTHER): Payer: 59 | Admitting: Internal Medicine

## 2015-08-11 ENCOUNTER — Encounter: Payer: Self-pay | Admitting: Internal Medicine

## 2015-08-11 VITALS — BP 134/82 | Temp 98.6°F | Ht 59.0 in | Wt 178.2 lb

## 2015-08-11 DIAGNOSIS — R739 Hyperglycemia, unspecified: Secondary | ICD-10-CM

## 2015-08-11 DIAGNOSIS — Z0000 Encounter for general adult medical examination without abnormal findings: Secondary | ICD-10-CM | POA: Diagnosis not present

## 2015-08-11 DIAGNOSIS — I10 Essential (primary) hypertension: Secondary | ICD-10-CM | POA: Diagnosis not present

## 2015-08-11 DIAGNOSIS — E282 Polycystic ovarian syndrome: Secondary | ICD-10-CM | POA: Diagnosis not present

## 2015-08-11 NOTE — Progress Notes (Signed)
Pre visit review using our clinic review tool, if applicable. No additional management support is needed unless otherwise documented below in the visit note.  Chief Complaint  Patient presents with  . Annual Exam    HPI: Patient  Gail [name]  49 y.o. comes in today for Preventive Health Care visit  Since last visit doing ok  Has had PF and cortisone  Shots recently .  Back  Left foot  Numbness.   Ms issues  Limiting otherwise ok   bp good  Trying to eat healthy Taking b12 gummies Had recnet upper burning midline   abouple times in evening ? Laying down  Not now no cough  Health Maintenance  Topic Date Due  . INFLUENZA VACCINE  07/14/2015  . HIV Screening  08/09/2016 (Originally 08/31/1981)  . TETANUS/TDAP  11/26/2019   Health Maintenance Review LIFESTYLE:  Exercise:   Trying   .   Work   With Optician, dispensing   Tobacco/ETS:no Alcohol: not regSugar beverages: stevia  Sleep: ok Drug use: no  ROS:  GEN/ HEENT: No fever, significant weight changes sweats headaches vision problems hearing changes, CV/ PULM; No chest pain shortness of breath cough, syncope,edema  change in exercise tolerance. GI /GU: No adominal pain, vomiting, change in bowel habits. No blood in the stool. No significant GU symptoms. SKIN/HEME: ,no acute skin rashes suspicious lesions or bleeding. No lymphadenopathy, nodules, masses.  NEURO/ PSYCH:  No neurologic signs such as weakness . No depression anxiety. IMM/ Allergy: No unusual infections.  Allergy .   REST of 12 system review negative except as per HPI   Past Medical History  Diagnosis Date  . Hyperlipidemia   . Hypertension   . PCO (polycystic ovaries)   . Anal fissure   . Arthritis   . Allergy     History of shots as a child  . Rosacea   . History of infertility, female   . IBS (irritable bowel syndrome)     Past Surgical History  Procedure Laterality Date  . Carpal tunnel release      x2 bilateral  . Wisdom tooth extraction    . Knee  arthroscopy      left   . Wrist tendon release      bilateral thumbs    Family History  Problem Relation Age of Onset  . Hypertension    . Lung cancer      uncle  . Breast cancer      great aunt  . Hypertension Mother   . Heart disease Mother   . Hypertension Father   . Dementia Maternal Grandfather   . Heart disease Paternal Grandmother   . Dementia Paternal Grandfather   . ALS Father     deceased    Social History   Social History  . Marital Status: Married    Spouse Name: N/A  . Number of Children: 0  . Years of Education: N/A   Occupational History  . INFANT TEACHER    Social History Main Topics  . Smoking status: Former Smoker -- 0.50 packs/day for 7 years    Types: Cigarettes    Quit date: 12/13/1986  . Smokeless tobacco: Never Used  . Alcohol Use: No  . Drug Use: No  . Sexual Activity: Not Asked     Comment: married   Other Topics Concern  . 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    Outpatient Prescriptions Prior to Visit  Medication Sig Dispense Refill  . DILT-XR 240 MG 24 hr capsule TAKE ONE CAPSULE BY MOUTH EVERY DAY 30 capsule 12  . fluticasone (FLONASE) 50 MCG/ACT nasal spray 2 sprays per nostril daily.  MUST HAVE OFFICE VISIT FOR FURTHER REFILLS. 16 g 0  . Lactobacillus (ACIDOPHILUS) 10 MG CAPS Take by mouth daily.     Marland Kitchen levonorgestrel (MIRENA) 20 MCG/24HR IUD 1 each by Intrauterine route once.      Marland Kitchen losartan (COZAAR) 50 MG tablet TAKE 1 TABLET BY MOUTH EVERY DAY 90 tablet 0  . losartan (COZAAR) 50 MG tablet TAKE 1 TABLET BY MOUTH EVERY DAY 30 tablet 0  . metFORMIN (GLUCOPHAGE) 500 MG tablet TAKE 1 TABLET BY MOUTH EVERY MORNING WITH BREAKFAST 90 tablet 0  . metroNIDAZOLE (METROGEL) 0.75 % gel Apply 1 application topically daily. 45 g 2  . nystatin ointment  (MYCOSTATIN) Apply topically as directed 30 g 1  . RESTASIS 0.05 % ophthalmic emulsion     . Zinc Oxide 30.6 % CREA Apply topically as needed.     . fluconazole (DIFLUCAN) 150 MG tablet Take 1 tablet (150 mg total) by mouth once. 1 tablet 2   No facility-administered medications prior to visit.     EXAM:  BP 134/82 mmHg  Temp(Src) 98.6 F (37 C) (Oral)  Ht 4\' 11"  (1.499 m)  Wt 178 lb 3.2 oz (80.831 kg)  BMI 35.97 kg/m2  Body mass index is 35.97 kg/(m^2).  Physical Exam: Vital signs reviewed PZW:CHEN is a well-developed well-nourished alert cooperative    who appearsr stated age in no acute distress.  HEENT: normocephalic atraumatic , Eyes: PERRL EOM's full, conjunctiva clear, Nares: paten,t no deformity discharge or tenderness., Ears: no deformity EAC's clear TMs with normal landmarks. Mouth: clear OP, no lesions, edema.  Moist mucous membranes. Dentition in adequate repair. NECK: supple without masses, thyromegaly or bruits. CHEST/PULM:  Clear to auscultation and percussion breath sounds equal no wheeze , rales or rhonchi. No chest wall deformities or tenderness.Breast: normal by inspection . No dimpling, discharge, masses, tenderness or discharge . CV: PMI is nondisplaced, S1 S2 no gallops, murmurs, rubs. Peripheral pulses are full without delay.No JVD .  ABDOMEN: Bowel sounds normal nontender  No guard or rebound, no hepato splenomegal no CVA tenderness.  No hernia. Extremtities:  No clubbing cyanosis or edema, no acute joint swelling or redness no focal atrophy  hels teender after shots  NEURO:  Oriented x3, cranial nerves 3-12 appear to be intact, no obvious focal weakness,gait within normal limits no abnormal reflexes  SKIN: No acute rashes normal turgor, color, no bruising or petechiae. PSYCH: Oriented, good eye contact, no obvious depression anxiety, cognition and judgment appear normal. LN: no cervical axillary inguinal adenopathy  Lab Results  Component Value Date   WBC  6.0 07/25/2015   HGB 14.3 07/25/2015   HCT 42.7 07/25/2015   PLT 232.0 07/25/2015   GLUCOSE 106* 07/25/2015   CHOL 220* 07/25/2015   TRIG 75.0 07/25/2015   HDL 53.30 07/25/2015   LDLDIRECT 160.7 12/10/2010   LDLCALC 152* 07/25/2015   ALT 12 07/25/2015   AST 16 07/25/2015   NA 139 07/25/2015   K 4.2 07/25/2015   CL 105 07/25/2015   CREATININE 0.77 07/25/2015   BUN 16  07/25/2015   CO2 25 07/25/2015   TSH 2.31 07/25/2015   HGBA1C 5.8 07/25/2015   BP Readings from Last 3 Encounters:  08/11/15 134/82  07/07/15 120/78  01/15/15 140/82   Wt Readings from Last 3 Encounters:  08/11/15 178 lb 3.2 oz (80.831 kg)  07/07/15 180 lb (81.647 kg)  01/15/15 183 lb 4.8 oz (83.144 kg)      ASSESSMENT AND PLAN:  Discussed the following assessment and plan:  Visit for preventive health examination  Essential hypertension  Hyperglycemia - stable  a1c below 6 now   PCOS (polycystic ovarian syndrome) - on metformin ootho conditions  Are porblematic but continues to  swim.  Mild  gerd sx.  Patient Care Team: Burnis Medin, MD as PCP - General (Internal Medicine) Satira Sark, MD (Cardiology) Cheri Fowler, MD as Consulting Physician (Obstetrics and Gynecology) Patient Instructions  Consider     Water aerobics also .  For resistance training   To help supporting muscles  . blood pressure and  Labs are good  Except .  Could try  Short term celebrex for 2-3 weeks until calmed down.   Continue lifestyle intervention healthy eating and exercise . Anti reflux   measures . And otc   Pepcid  Zantac     Avoid calcium   Antacids .    Food Choices for Gastroesophageal Reflux Disease When you have gastroesophageal reflux disease (GERD), the foods you eat and your eating habits are very important. Choosing the right foods can help ease the discomfort of GERD. WHAT GENERAL GUIDELINES DO I NEED TO FOLLOW?  Choose fruits, vegetables, whole grains, low-fat dairy products, and low-fat  meat, fish, and poultry.  Limit fats such as oils, salad dressings, butter, nuts, and avocado.  Keep a food diary to identify foods that cause symptoms.  Avoid foods that cause reflux. These may be different for different people.  Eat frequent small meals instead of three large meals each day.  Eat your meals slowly, in a relaxed setting.  Limit fried foods.  Cook foods using methods other than frying.  Avoid drinking alcohol.  Avoid drinking large amounts of liquids with your meals.  Avoid bending over or lying down until 2-3 hours after eating. WHAT FOODS ARE NOT RECOMMENDED? The following are some foods and drinks that may worsen your symptoms: Vegetables Tomatoes. Tomato juice. Tomato and spaghetti sauce. Chili peppers. Onion and garlic. Horseradish. Fruits Oranges, grapefruit, and lemon (fruit and juice). Meats High-fat meats, fish, and poultry. This includes hot dogs, ribs, ham, sausage, salami, and bacon. Dairy Whole milk and chocolate milk. Sour cream. Cream. Butter. Ice cream. Cream cheese.  Beverages Coffee and tea, with or without caffeine. Carbonated beverages or energy drinks. Condiments Hot sauce. Barbecue sauce.  Sweets/Desserts Chocolate and cocoa. Donuts. Peppermint and spearmint. Fats and Oils High-fat foods, including [place] fries and potato chips. Other Vinegar. Strong spices, such as black pepper, white pepper, red pepper, cayenne, curry powder, cloves, ginger, and chili powder. The items listed above may not be a complete list of foods and beverages to avoid. Contact your dietitian for more information. Document Released: 11/29/2005 Document Revised: 12/04/2013 Document Reviewed: 10/03/2013 [facility] Patient Information 2015 [place], [place]. This information is not intended to replace advice given to you by your health care provider. Make sure you discuss any questions you have with your health care provider.     Standley Brooking. Shelda Truby M.D.

## 2015-08-11 NOTE — Patient Instructions (Signed)
Consider     Water aerobics also .  For resistance training   To help supporting muscles  . blood pressure and  Labs are good  Except .  Could try  Short term celebrex for 2-3 weeks until calmed down.   Continue lifestyle intervention healthy eating and exercise . Anti reflux   measures . And otc   Pepcid  Zantac     Avoid calcium   Antacids .    Food Choices for Gastroesophageal Reflux Disease When you have gastroesophageal reflux disease (GERD), the foods you eat and your eating habits are very important. Choosing the right foods can help ease the discomfort of GERD. WHAT GENERAL GUIDELINES DO I NEED TO FOLLOW?  Choose fruits, vegetables, whole grains, low-fat dairy products, and low-fat meat, fish, and poultry.  Limit fats such as oils, salad dressings, butter, nuts, and avocado.  Keep a food diary to identify foods that cause symptoms.  Avoid foods that cause reflux. These may be different for different people.  Eat frequent small meals instead of three large meals each day.  Eat your meals slowly, in a relaxed setting.  Limit fried foods.  Cook foods using methods other than frying.  Avoid drinking alcohol.  Avoid drinking large amounts of liquids with your meals.  Avoid bending over or lying down until 2-3 hours after eating. WHAT FOODS ARE NOT RECOMMENDED? The following are some foods and drinks that may worsen your symptoms: Vegetables Tomatoes. Tomato juice. Tomato and spaghetti sauce. Chili peppers. Onion and garlic. Horseradish. Fruits Oranges, grapefruit, and lemon (fruit and juice). Meats High-fat meats, fish, and poultry. This includes hot dogs, ribs, ham, sausage, salami, and bacon. Dairy Whole milk and chocolate milk. Sour cream. Cream. Butter. Ice cream. Cream cheese.  Beverages Coffee and tea, with or without caffeine. Carbonated beverages or energy drinks. Condiments Hot sauce. Barbecue sauce.  Sweets/Desserts Chocolate and cocoa. Donuts. Peppermint  and spearmint. Fats and Oils High-fat foods, including [place] fries and potato chips. Other Vinegar. Strong spices, such as black pepper, white pepper, red pepper, cayenne, curry powder, cloves, ginger, and chili powder. The items listed above may not be a complete list of foods and beverages to avoid. Contact your dietitian for more information. Document Released: 11/29/2005 Document Revised: 12/04/2013 Document Reviewed: 10/03/2013 [facility] Patient Information 2015 [place], [place]. This information is not intended to replace advice given to you by your health care provider. Make sure you discuss any questions you have with your health care provider.

## 2015-08-21 ENCOUNTER — Other Ambulatory Visit: Payer: Self-pay | Admitting: Orthopedic Surgery

## 2015-08-21 DIAGNOSIS — M545 Low back pain: Secondary | ICD-10-CM

## 2015-09-19 ENCOUNTER — Ambulatory Visit (INDEPENDENT_AMBULATORY_CARE_PROVIDER_SITE_OTHER): Payer: 59 | Admitting: Internal Medicine

## 2015-09-19 ENCOUNTER — Encounter: Payer: Self-pay | Admitting: Internal Medicine

## 2015-09-19 ENCOUNTER — Encounter: Payer: Self-pay | Admitting: Family Medicine

## 2015-09-19 VITALS — BP 112/80 | Temp 98.1°F | Wt 179.4 lb

## 2015-09-19 DIAGNOSIS — R82998 Other abnormal findings in urine: Secondary | ICD-10-CM

## 2015-09-19 DIAGNOSIS — Z79899 Other long term (current) drug therapy: Secondary | ICD-10-CM | POA: Diagnosis not present

## 2015-09-19 DIAGNOSIS — R110 Nausea: Secondary | ICD-10-CM

## 2015-09-19 DIAGNOSIS — N390 Urinary tract infection, site not specified: Secondary | ICD-10-CM

## 2015-09-19 DIAGNOSIS — R509 Fever, unspecified: Secondary | ICD-10-CM | POA: Diagnosis not present

## 2015-09-19 LAB — POCT URINALYSIS DIP (MANUAL ENTRY)
Bilirubin, UA: NEGATIVE
Glucose, UA: NEGATIVE
Ketones, POC UA: NEGATIVE
Nitrite, UA: NEGATIVE
PROTEIN UA: NEGATIVE
SPEC GRAV UA: 1.025
Urobilinogen, UA: 0.2
pH, UA: 6

## 2015-09-19 MED ORDER — CEPHALEXIN 500 MG PO CAPS: 500.0000 mg | ORAL_CAPSULE | Freq: Three times a day (TID) | ORAL | Status: DC

## 2015-09-19 MED ORDER — METFORMIN HCL 500 MG PO TABS: 500.0000 mg | ORAL_TABLET | Freq: Every day | ORAL | Status: DC

## 2015-09-19 NOTE — Progress Notes (Signed)
Pre visit review using our clinic review tool, if applicable. No additional management support is needed unless otherwise documented below in the visit note.   Chief Complaint  Patient presents with  . Nausea  . Generalized Body Aches  . Fever  . Headache  . Chills    HPI: Patient Gail [name]  comes in today for SDA for  new problem evaluation. Onset yest am . 1 days of above sx achiness .   Was 100.1 axillary  ... 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 Able to take  Soup. No uti sx bu has had uti in past and didn't know it.    No sever pain cough st .  ROS: See pertinent positives and negatives per HPI. No cough vomiting diarrhea no hematuria dysuria.  Past Medical History  Diagnosis Date  . Hyperlipidemia   . Hypertension   . PCO (polycystic ovaries)   . Anal fissure   . Arthritis   . Allergy     History of shots as a child  . Rosacea   . History of infertility, female   . IBS (irritable bowel syndrome)     Family History  Problem Relation Age of Onset  . Hypertension    . Lung cancer      uncle  . Breast cancer      great aunt  . Hypertension Mother   . Heart disease Mother   . Hypertension Father   . Dementia Maternal Grandfather   . Heart disease Paternal Grandmother   . Dementia Paternal Grandfather   . ALS Father     deceased    Social History   Social History  . Marital Status: Married    Spouse Name: N/A  . Number of Children: 0  . Years of Education: N/A   Occupational History  . INFANT TEACHER    Social History Main Topics  . Smoking status: Former Smoker -- 0.50 packs/day for 7 years    Types: Cigarettes    Quit date: 12/13/1986  . Smokeless tobacco: Never Used  . Alcohol Use: No  . Drug Use: No  . Sexual Activity: Not Asked     Comment: married   Other Topics Concern  . 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    Outpatient Prescriptions Prior to Visit  Medication Sig Dispense Refill  . DILT-XR 240 MG 24 hr capsule TAKE ONE CAPSULE BY MOUTH EVERY DAY 30 capsule 12  . fluticasone (FLONASE) 50 MCG/ACT nasal spray 2 sprays per nostril daily.  MUST HAVE OFFICE VISIT FOR FURTHER REFILLS. 16 g 0  . Lactobacillus (ACIDOPHILUS) 10 MG CAPS Take by mouth daily.     Marland Kitchen levonorgestrel (MIRENA) 20 MCG/24HR IUD 1 each by Intrauterine route once.      Marland Kitchen losartan (COZAAR) 50 MG tablet TAKE 1 TABLET BY MOUTH EVERY DAY 90 tablet 0  . losartan (COZAAR) 50 MG tablet TAKE 1 TABLET BY MOUTH EVERY DAY 30 tablet 0  . metroNIDAZOLE (METROGEL) 0.75 % gel Apply 1 application topically daily. 45 g 2  . nystatin ointment (MYCOSTATIN) Apply topically as directed 30 g 1  . RESTASIS 0.05 % ophthalmic emulsion     .  Zinc Oxide 30.6 % CREA Apply topically as needed.     . metFORMIN (GLUCOPHAGE) 500 MG tablet TAKE 1 TABLET BY MOUTH EVERY MORNING WITH BREAKFAST 90 tablet 0   No facility-administered medications prior to visit.     EXAM:  BP 112/80 mmHg  Temp(Src) 98.1 F (36.7 C) (Oral)  Wt 179 lb 6.4 oz (81.375 kg)  Body mass index is 36.21 kg/(m^2).  GENERAL: vitals reviewed and listed above, alert, oriented, appears well hydrated and in no acute distress  midly ill non toxic  HEENT: atraumatic, conjunctiva  clear, no obvious abnormalities on inspection of external nose and ears OP : no lesion edema or exudate  NECK: no obvious masses on inspection palpation  LUNGS: clear to auscultation bilaterally, no wheezes, rales or rhonchi, good air movement CV: HRRR, no clubbing cyanosis or  peripheral edema nl cap refill  Abs no masses  midl discomfort  Mid abdomen no g or r mild back pain. MS: moves all extremities without noticeable focal  Abnormality Skin: normal capillary refill ,turgor , color: No acute rashes ,petechiae or bruising PSYCH: pleasant and  cooperative,  ASSESSMENT AND PLAN:  Discussed the following assessment and plan:  Fever, unspecified - Plan: POCT urinalysis dipstick, Culture, Urine  Nausea without vomiting - Plan: POCT urinalysis dipstick, Culture, Urine  Leukocytes in urine - poss uti  - Plan: Culture, Urine  Medication management - refill metformin  Acts viral r/o uti   Rest fluids     Expectant management. However urinalysis shows +1 leukocytes could be from that fever but has had UTIs before the presented with nausea and body aches. Empiric treatment with antibiotically culture pending. Risk-benefit even Ok to refill metformin today  -Patient advised to return or notify health care team  if symptoms worsen ,persist or new concerns arise.  Patient Instructions  Acts viral but could be uti atypical. Antibiotic over weekend if not getting better  Culture results pending.       Standley Brooking. Panosh M.D.

## 2015-09-19 NOTE — Patient Instructions (Signed)
Acts viral but could be uti atypical. Antibiotic over weekend if not getting better  Culture results pending.

## 2015-09-21 LAB — URINE CULTURE: Colony Count: 70000

## 2015-09-22 NOTE — Progress Notes (Signed)
Quick Note:  Tell pt urine culture showed multiple bacteria but not predominant germ. Cannot tell if she has UTI. How is she doing? If still having fever And didn't take antibiotic can recollect Good clean catch urine for ua and cx ______

## 2015-09-24 ENCOUNTER — Encounter: Payer: Self-pay | Admitting: Internal Medicine

## 2015-10-02 ENCOUNTER — Other Ambulatory Visit: Payer: Self-pay

## 2015-10-02 DIAGNOSIS — Z1231 Encounter for screening mammogram for malignant neoplasm of breast: Secondary | ICD-10-CM

## 2015-10-03 ENCOUNTER — Other Ambulatory Visit: Payer: Self-pay | Admitting: Internal Medicine

## 2015-10-03 NOTE — Telephone Encounter (Signed)
Sent to the pharmacy by e-scribe. 

## 2015-10-22 ENCOUNTER — Ambulatory Visit
Admission: RE | Admit: 2015-10-22 | Discharge: 2015-10-22 | Disposition: A | Discharge status: Home / Self Care | Payer: 59 | Source: Ambulatory Visit

## 2015-10-22 DIAGNOSIS — Z1231 Encounter for screening mammogram for malignant neoplasm of breast: Secondary | ICD-10-CM

## 2015-11-19 ENCOUNTER — Ambulatory Visit (INDEPENDENT_AMBULATORY_CARE_PROVIDER_SITE_OTHER): Payer: 59 | Admitting: Internal Medicine

## 2015-11-19 ENCOUNTER — Encounter: Payer: Self-pay | Admitting: Internal Medicine

## 2015-11-19 VITALS — BP 116/70 | Temp 98.3°F | Wt 179.8 lb

## 2015-11-19 DIAGNOSIS — J988 Other specified respiratory disorders: Secondary | ICD-10-CM | POA: Diagnosis not present

## 2015-11-19 DIAGNOSIS — J019 Acute sinusitis, unspecified: Secondary | ICD-10-CM | POA: Diagnosis not present

## 2015-11-19 DIAGNOSIS — J22 Unspecified acute lower respiratory infection: Secondary | ICD-10-CM

## 2015-11-19 MED ORDER — HYDROCODONE-HOMATROPINE 5-1.5 MG/5ML PO SYRP: 5.0000 mL | ORAL_SOLUTION | ORAL | Status: DC | PRN

## 2015-11-19 MED ORDER — AMOXICILLIN-POT CLAVULANATE 875-125 MG PO TABS: 1.0000 | ORAL_TABLET | Freq: Two times a day (BID) | ORAL | Status: DC

## 2015-11-19 NOTE — Progress Notes (Signed)
Pre visit review using our clinic review tool, if applicable. No additional management support is needed unless otherwise documented below in the visit note.  Chief Complaint  Patient presents with  . Hoarse  . Nasal Congestion  . Cough  . Pressure Behind the Eyes  . Sinus Pain/Pressure  . Watery Eyes    HPI: Patient Gail [name]  comes in today for SDA for  new problem evaluation. Onset  Gi vomiting   A week ago and then  4-5 dauyscouging ans hoarse  So much  Concern about sinus infection and yellow green  gloody nose   Both  Some face pain .  No fever .   Working  Cough ing a lot now.   ROS: See pertinent positives and negatives per HPI. No cp sob  No stridor is very hoarse  Past Medical History  Diagnosis Date  . Hyperlipidemia   . Hypertension   . PCO (polycystic ovaries)   . Anal fissure   . Arthritis   . Allergy     History of shots as a child  . Rosacea   . History of infertility, female   . IBS (irritable bowel syndrome)     Family History  Problem Relation Age of Onset  . Hypertension    . Lung cancer      uncle  . Breast cancer      great aunt  . Hypertension Mother   . Heart disease Mother   . Hypertension Father   . Dementia Maternal Grandfather   . Heart disease Paternal Grandmother   . Dementia Paternal Grandfather   . ALS Father     deceased    Social History   Social History  . Marital Status: Married    Spouse Name: N/A  . Number of Children: 0  . Years of Education: N/A   Occupational History  . INFANT TEACHER    Social History Main Topics  . Smoking status: Former Smoker -- 0.50 packs/day for 7 years    Types: Cigarettes    Quit date: 12/13/1986  . Smokeless tobacco: Never Used  . Alcohol Use: No  . Drug Use: No  . Sexual Activity: Not Asked     Comment: married   Other Topics Concern  . 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    Outpatient Prescriptions Prior to Visit  Medication Sig Dispense Refill  . Cyanocobalamin (B-12) 1000 MCG SUBL Place under the tongue.    Marland Kitchen DILT-XR 240 MG 24 hr capsule TAKE ONE CAPSULE BY MOUTH EVERY DAY 30 capsule 12  . fluticasone (FLONASE) 50 MCG/ACT nasal spray 2 sprays per nostril daily.  MUST HAVE OFFICE VISIT FOR FURTHER REFILLS. 16 g 0  . Lactobacillus (ACIDOPHILUS) 10 MG CAPS Take by mouth daily.     Marland Kitchen levonorgestrel (MIRENA) 20 MCG/24HR IUD 1 each by Intrauterine route once.      Marland Kitchen losartan (COZAAR) 50 MG tablet TAKE 1 TABLET BY MOUTH EVERY DAY 90 tablet 0  . losartan (COZAAR) 50 MG tablet TAKE 1 TABLET BY MOUTH EVERY DAY 90 tablet 1  . metFORMIN (GLUCOPHAGE) 500 MG tablet Take 1 tablet (500 mg total) by mouth daily with breakfast. 90 tablet 3  .  metroNIDAZOLE (METROGEL) 0.75 % gel Apply 1 application topically daily. 45 g 2  . nystatin ointment (MYCOSTATIN) Apply topically as directed 30 g 1  . RESTASIS 0.05 % ophthalmic emulsion     . Zinc Oxide 30.6 % CREA Apply topically as needed.     . cephALEXin (KEFLEX) 500 MG capsule Take 1 capsule (500 mg total) by mouth 3 (three) times daily. 21 capsule 0   No facility-administered medications prior to visit.     EXAM:  BP 116/70 mmHg  Temp(Src) 98.3 F (36.8 C) (Oral)  Wt 179 lb 12.8 oz (81.557 kg)  Body mass index is 36.3 kg/(m^2). WDWN in NAD  quiet respirations; mildly congested  somewhat hoarse. Non toxic . HEENT: Normocephalic ;atraumatic , Eyes;  PERRL, EOMs  Full, lids and conjunctiva clear,,Ears: no deformities, canals nl, TM landmarks normal, Nose: no deformity clear discharge but congested;face minimally tender Mouth : OP clear without lesion or edema . Neck: Supple without adenopathy or masses or bruits Chest:  Clear to A&P without wheezes rales or rhonchi CV:  S1-S2 no gallops or murmurs peripheral perfusion is normal Skin :nl  perfusion and no acute rashes   ASSESSMENT AND PLAN:  Discussed the following assessment and plan:  Acute respiratory infection  Acute rhinosinusitis - prob viral  expectant managment   hold on to antibiotic rx and begin  if needed  for parameters dsicussed  Cough med for comfort  Rest etx reassuring exam  -Patient advised to return or notify health care team  if symptoms worsen ,persist or new concerns arise.  Patient Instructions  Appears to be  A viral resp infection and sinus infection that often resolves s on its own  saline nose spray fluids Cough med for comfort  Can try afrin nose spray at night x 3 night to help with  Breathing at night  If l;asting 2 weeks or relapsing sx sinus pain after improvement can add antibiotic Contact us if fever or other concerns         Standley Brooking. Preciosa Bundrick M.D.

## 2015-11-19 NOTE — Patient Instructions (Signed)
Appears to be  A viral resp infection and sinus infection that often resolves s on its own  saline nose spray fluids Cough med for comfort  Can try afrin nose spray at night x 3 night to help with  Breathing at night  If l;asting 2 weeks or relapsing sx sinus pain after improvement can add antibiotic Contact us if fever or other concerns

## 2015-12-01 ENCOUNTER — Other Ambulatory Visit: Payer: Self-pay | Admitting: Cardiology

## 2015-12-02 ENCOUNTER — Other Ambulatory Visit: Payer: Self-pay | Admitting: Cardiology

## 2015-12-08 ENCOUNTER — Encounter: Payer: Self-pay | Admitting: Internal Medicine

## 2015-12-14 DIAGNOSIS — R3129 Other microscopic hematuria: Secondary | ICD-10-CM

## 2015-12-14 HISTORY — DX: Other microscopic hematuria: R31.29

## 2015-12-29 ENCOUNTER — Other Ambulatory Visit: Payer: Self-pay | Admitting: Internal Medicine

## 2015-12-29 ENCOUNTER — Other Ambulatory Visit: Payer: Self-pay | Admitting: Cardiology

## 2015-12-29 ENCOUNTER — Encounter: Payer: Self-pay | Admitting: Internal Medicine

## 2015-12-29 NOTE — Addendum Note (Signed)
Addended by: Miles Costain T on: 12/29/2015 11:25 AM   Modules accepted: Orders

## 2015-12-30 MED ORDER — HYDROCODONE-HOMATROPINE 5-1.5 MG/5ML PO SYRP: 5.0000 mL | ORAL_SOLUTION | ORAL | Status: DC | PRN

## 2015-12-30 NOTE — Telephone Encounter (Signed)
Ok to refill  Cough med x 1

## 2016-01-04 ENCOUNTER — Other Ambulatory Visit: Payer: Self-pay | Admitting: Cardiology

## 2016-01-15 ENCOUNTER — Other Ambulatory Visit: Payer: Self-pay | Admitting: Cardiology

## 2016-01-16 ENCOUNTER — Other Ambulatory Visit: Payer: Self-pay | Admitting: Cardiology

## 2016-01-16 ENCOUNTER — Telehealth: Payer: Self-pay | Admitting: Family Medicine

## 2016-01-16 NOTE — Telephone Encounter (Signed)
Rx(s) sent to pharmacy electronically.  

## 2016-01-16 NOTE — Telephone Encounter (Signed)
Sure  Refill x 6 months

## 2016-01-16 NOTE — Telephone Encounter (Signed)
Pt called to see if you will take over diltiazem.  Has been refilled in the past by Dr. Stanford Breed.  DILT-XR 240 MG 24 hr capsule 30 capsule 0 01/16/2016      Sig:  TAKE 1 CAPSULE DAILY. NEEDS APPOINTMENT FOR FUTURE REFILLS.    Class:  Normal    DAW:  No    Comment:  PATIENT WILL NEED TO GET FUTURE REFILLS FROM PCP    Authorizing Provider:  Lelon Perla, MD    Ordering User:  Harold Hedge, CMA

## 2016-01-16 NOTE — Telephone Encounter (Signed)
Left a message on identified cell.  WP will take of medication.  Dr. Jacalyn Lefevre office did send in a 30 day supply today.  Advised she call her pharmacy when needed and have them send over an electronic request for additional refills.

## 2016-02-22 ENCOUNTER — Other Ambulatory Visit: Payer: Self-pay | Admitting: Cardiology

## 2016-02-26 ENCOUNTER — Other Ambulatory Visit: Payer: Self-pay | Admitting: Internal Medicine

## 2016-02-28 ENCOUNTER — Other Ambulatory Visit: Payer: Self-pay | Admitting: Internal Medicine

## 2016-02-28 ENCOUNTER — Telehealth: Payer: Self-pay | Admitting: Internal Medicine

## 2016-02-28 NOTE — Telephone Encounter (Signed)
[facility] Patient Name: Gail [name] DOB: 1966-10-20 Initial Comment Caller states she is completely out of bp meds. Has been calling office since Tuesday. No symptoms right now. Nurse Assessment Nurse: Christel Mormon, RN, Levada Dy Date/Time (Eastern Time): 02/28/2016 9:52:55 AM Please select the assessment type ---Refill Additional Documentation ---Pt went to pick up Rx on Tuesday but no refills and the pharmacy sent the Rx request to her cardiology office and she has not seen them in over a year. Dr Regis Bill took over the Losartan last month. The pharmacy was to resend it to Dr. Regis Bill. She also left a message at the office. The pharmacy resent the Rx and she also called the office and left a message. She even left a message that she can make an appt but needs a Rx now. Pharmacist has already given her an emergency supply of 5 days. Her last office visit was before Christmas. Does the patient have enough medication to last until the office opens? ---Unable to obtain loaner dose from Pharmacy Does the client directives allow for assistance with medications after hours? ---Yes Was the medication filled within the last 6 months? ---Yes What is the name of the medication, dose and instructions as listed on the bottle? ---Diltiazem XR 240mg  1 tab daily Name of the physician as listed on the bottle. ---Dr. Kirk Ruths - needs transferred to Dr. Regis Bill as has not seen cardiologist in over a year Pharmacy name and phone number where most recently filled. ---[facility] [phone] Guidelines Guideline Title Affirmed Question Affirmed Notes Final Disposition User Clinical Call Christel Mormon, RN, Levada Dy

## 2016-03-01 MED ORDER — LOSARTAN POTASSIUM 50 MG PO TABS: 50.0000 mg | ORAL_TABLET | Freq: Every day | ORAL | Status: DC

## 2016-03-01 MED ORDER — DILTIAZEM HCL ER 240 MG PO CP24: ORAL_CAPSULE | ORAL | Status: DC

## 2016-03-01 NOTE — Telephone Encounter (Signed)
Pt is out of med

## 2016-03-01 NOTE — Telephone Encounter (Signed)
Duplicate request.  Sent in earlier today. 

## 2016-03-01 NOTE — Telephone Encounter (Signed)
Medication sent to the pharmacy by e-scribe.  Pt notified by telephone.  Also made future bp follow up appt for 03/15/16.

## 2016-03-13 NOTE — Progress Notes (Signed)
Pre visit review using our clinic review tool, if applicable. No additional management support is needed unless otherwise documented below in the visit note.  Chief Complaint  Patient presents with  . Follow-up    HB and ? coughback   . Hypertension    HPI: Gail [name] 50 y.o.   Fu BP   Taking over medications  .  From cardiology   No change in med has been ok  Rare miss   Having ocass  HB water brash  Changing diet    To help.  Cough ocass  No wheeze sob .    Hx of  Response to gabapentin fo irritable airway in past    Last resp infection was in winter fall ROS: See pertinent positives and negatives per HPI. Stress  Husband with dm  Cancels his appts with docx  Past Medical History  Diagnosis Date  . Hyperlipidemia   . Hypertension   . PCO (polycystic ovaries)   . Anal fissure   . Arthritis   . Allergy     History of shots as a child  . Rosacea   . History of infertility, female   . IBS (irritable bowel syndrome)     Family History  Problem Relation Age of Onset  . Hypertension    . Lung cancer      uncle  . Breast cancer      great aunt  . Hypertension Mother   . Heart disease Mother   . Hypertension Father   . Dementia Maternal Grandfather   . Heart disease Paternal Grandmother   . Dementia Paternal Grandfather   . ALS Father     deceased    Social History   Social History  . Marital Status: Married    Spouse Name: N/A  . Number of Children: 0  . Years of Education: N/A   Occupational History  . INFANT TEACHER    Social History Main Topics  . Smoking status: Former Smoker -- 0.50 packs/day for 7 years    Types: Cigarettes    Quit date: 12/13/1986  . Smokeless tobacco: Never Used  . Alcohol Use: No  . Drug Use: No  . Sexual Activity: Not Asked     Comment: married   Other Topics Concern  . 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    Outpatient Prescriptions Prior to Visit  Medication Sig Dispense Refill  . Cyanocobalamin (B-12) 1000 MCG SUBL Place under the tongue.    . diltiazem (DILT-XR) 240 MG 24 hr capsule TAKE 1 CAPSULE DAILY. NEEDS APPOINTMENT FOR FUTURE REFILLS. 90 capsule 0  . fluticasone (FLONASE) 50 MCG/ACT nasal spray 2 sprays per nostril daily.  MUST HAVE OFFICE VISIT FOR FURTHER REFILLS. 16 g 0  . Lactobacillus (ACIDOPHILUS) 10 MG CAPS Take by mouth daily.     Marland Kitchen levonorgestrel (MIRENA) 20 MCG/24HR IUD 1 each by Intrauterine route once.      Marland Kitchen losartan (COZAAR) 50 MG tablet Take 1 tablet (50 mg total) by mouth daily. 90 tablet 0  . metFORMIN (GLUCOPHAGE) 500 MG tablet Take 1 tablet (500 mg total) by mouth daily with breakfast. 90 tablet 3  . metroNIDAZOLE (METROGEL) 0.75 % gel Apply 1  application topically daily. 45 g 2  . nystatin ointment (MYCOSTATIN) Apply topically as directed 30 g 1  . RESTASIS 0.05 % ophthalmic emulsion     . Zinc Oxide 30.6 % CREA Apply topically as needed.     Marland Kitchen amoxicillin-clavulanate (AUGMENTIN) 875-125 MG tablet Take 1 tablet by mouth 2 (two) times daily. 14 tablet 0  . HYDROcodone-homatropine (HYCODAN) 5-1.5 MG/5ML syrup Take 5 mLs by mouth every 4 (four) hours as needed for cough. 180 mL 0   No facility-administered medications prior to visit.     EXAM:  BP 128/78 mmHg  Temp(Src) 98.8 F (37.1 C) (Oral)  Wt 183 lb 3.2 oz (83.099 kg)  Body mass index is 36.98 kg/(m^2).  GENERAL: vitals reviewed and listed above, alert, oriented, appears well hydrated and in no acute distress HEENT: atraumatic, conjunctiva  clear, no obvious abnormalities on inspection of external nose and ears  NECK: no obvious masses on inspection palpation  LUNGS: clear to auscultation bilaterally, no wheezes, rales or rhonchi, good air movement CV: HRRR, no clubbing cyanosis or  peripheral edema nl cap refill  MS: moves  all extremities without noticeable focal  abnormality PSYCH: pleasant and cooperative, no obvious depression or anxiety BP Readings from Last 3 Encounters:  03/15/16 128/78  11/19/15 116/70  09/19/15 112/80   Lab Results  Component Value Date   WBC 6.0 07/25/2015   HGB 14.3 07/25/2015   HCT 42.7 07/25/2015   PLT 232.0 07/25/2015   GLUCOSE 106* 07/25/2015   CHOL 220* 07/25/2015   TRIG 75.0 07/25/2015   HDL 53.30 07/25/2015   LDLDIRECT 160.7 12/10/2010   LDLCALC 152* 07/25/2015   ALT 12 07/25/2015   AST 16 07/25/2015   NA 139 07/25/2015   K 4.2 07/25/2015   CL 105 07/25/2015   CREATININE 0.77 07/25/2015   BUN 16 07/25/2015   CO2 25 07/25/2015   TSH 2.31 07/25/2015   HGBA1C 5.8 07/25/2015    ASSESSMENT AND PLAN:  Discussed the following assessment and plan:  Essential hypertension  Medication management  Gastroesophageal reflux disease, esophagitis presence not specified  Cough Cough   Off and on since January    Hx irritable airway  Mild HB sx  Ranitidine   150 bid for 2-3 weeks  Then as needed  Takes b12  Plan check level at next  lab -Patient advised to return or notify health care team  if symptoms worsen ,persist or new concerns arise.  Patient Instructions  Start    Ranitidine 150 mg    Twice a day for 2-3 weeks fore the rflux  And  Diet changes that you are doing.  If cough continues can contact pulmonary as planned .  Continue  BP medication.  Have pharmacy  Contact us  Electronically   For refills  Due for  Labs and cpx in August   2017  cpx labs  Hg a1c and  B12 level    Mariann Laster K. Forrest Demuro M.D.

## 2016-03-15 ENCOUNTER — Ambulatory Visit (INDEPENDENT_AMBULATORY_CARE_PROVIDER_SITE_OTHER): Payer: 59 | Admitting: Internal Medicine

## 2016-03-15 ENCOUNTER — Encounter: Payer: Self-pay | Admitting: Internal Medicine

## 2016-03-15 VITALS — BP 128/78 | Temp 98.8°F | Wt 183.2 lb

## 2016-03-15 DIAGNOSIS — Z79899 Other long term (current) drug therapy: Secondary | ICD-10-CM | POA: Diagnosis not present

## 2016-03-15 DIAGNOSIS — R05 Cough: Secondary | ICD-10-CM

## 2016-03-15 DIAGNOSIS — I10 Essential (primary) hypertension: Secondary | ICD-10-CM | POA: Diagnosis not present

## 2016-03-15 DIAGNOSIS — K219 Gastro-esophageal reflux disease without esophagitis: Secondary | ICD-10-CM

## 2016-03-15 DIAGNOSIS — R059 Cough, unspecified: Secondary | ICD-10-CM

## 2016-03-15 NOTE — Patient Instructions (Addendum)
Start    Ranitidine 150 mg    Twice a day for 2-3 weeks fore the rflux  And  Diet changes that you are doing.  If cough continues can contact pulmonary as planned .  Continue  BP medication.  Have pharmacy  Contact us  Electronically   For refills  Due for  Labs and cpx in August   2017  cpx labs  Hg a1c and  B12 level

## 2016-03-20 ENCOUNTER — Ambulatory Visit
Admission: RE | Admit: 2016-03-20 | Discharge: 2016-03-20 | Disposition: A | Discharge status: Home / Self Care | Payer: 59 | Source: Ambulatory Visit | Attending: Orthopedic Surgery | Admitting: Orthopedic Surgery

## 2016-03-20 DIAGNOSIS — M545 Low back pain: Secondary | ICD-10-CM

## 2016-05-13 ENCOUNTER — Other Ambulatory Visit: Payer: Self-pay | Admitting: Internal Medicine

## 2016-05-14 NOTE — Telephone Encounter (Signed)
Refill  For a year by protochol

## 2016-05-14 NOTE — Telephone Encounter (Signed)
Pharmacy calling to see if the Metformin  500 mg can be filled today?

## 2016-05-14 NOTE — Telephone Encounter (Signed)
Pt is requesting a refill. Last filled on 09/19/15 #90 +3. Last OV 03/15/16. Okay to refill?

## 2016-05-18 ENCOUNTER — Other Ambulatory Visit: Payer: Self-pay | Admitting: Internal Medicine

## 2016-05-18 MED ORDER — METFORMIN HCL 500 MG PO TABS: 500.0000 mg | ORAL_TABLET | Freq: Every day | ORAL | Status: DC

## 2016-05-18 NOTE — Telephone Encounter (Signed)
Rx sent to pharmacy   

## 2016-05-18 NOTE — Telephone Encounter (Signed)
Pt needs refill metformin #90. Pt is out

## 2016-06-10 ENCOUNTER — Ambulatory Visit: Payer: 59 | Admitting: Family Medicine

## 2016-06-10 ENCOUNTER — Other Ambulatory Visit: Payer: Self-pay | Admitting: Internal Medicine

## 2016-06-11 ENCOUNTER — Other Ambulatory Visit: Payer: Self-pay | Admitting: Family Medicine

## 2016-06-11 ENCOUNTER — Telehealth: Payer: Self-pay | Admitting: Family Medicine

## 2016-06-11 DIAGNOSIS — Z0000 Encounter for general adult medical examination without abnormal findings: Secondary | ICD-10-CM

## 2016-06-11 DIAGNOSIS — R739 Hyperglycemia, unspecified: Secondary | ICD-10-CM

## 2016-06-11 NOTE — Telephone Encounter (Signed)
Pt due for cpx and lab work in August 2017.  I have placed the lab orders.  Please help the pt to make both appointments.  Thanks!!

## 2016-06-11 NOTE — Telephone Encounter (Signed)
Sent to the pharmacy by e-scribe.  Message sent to scheduling.  Pt due for cpx and lab work in Aug 17.

## 2016-06-14 NOTE — Telephone Encounter (Signed)
Pt has been sch

## 2016-07-23 ENCOUNTER — Other Ambulatory Visit (INDEPENDENT_AMBULATORY_CARE_PROVIDER_SITE_OTHER): Payer: 59

## 2016-07-23 DIAGNOSIS — Z0000 Encounter for general adult medical examination without abnormal findings: Secondary | ICD-10-CM

## 2016-07-23 DIAGNOSIS — R739 Hyperglycemia, unspecified: Secondary | ICD-10-CM

## 2016-07-23 LAB — CBC WITH DIFFERENTIAL/PLATELET
BASOS ABS: 0 10*3/uL (ref 0.0–0.1)
Basophils Relative: 0.6 % (ref 0.0–3.0)
Eosinophils Absolute: 0.1 10*3/uL (ref 0.0–0.7)
Eosinophils Relative: 1 % (ref 0.0–5.0)
HCT: 42.4 % (ref 36.0–46.0)
Hemoglobin: 14.3 g/dL (ref 12.0–15.0)
LYMPHS ABS: 1.9 10*3/uL (ref 0.7–4.0)
Lymphocytes Relative: 26.5 % (ref 12.0–46.0)
MCHC: 33.8 g/dL (ref 30.0–36.0)
MCV: 93.3 fl (ref 78.0–100.0)
MONO ABS: 0.6 10*3/uL (ref 0.1–1.0)
MONOS PCT: 8.6 % (ref 3.0–12.0)
NEUTROS ABS: 4.6 10*3/uL (ref 1.4–7.7)
NEUTROS PCT: 63.3 % (ref 43.0–77.0)
PLATELETS: 210 10*3/uL (ref 150.0–400.0)
RBC: 4.55 Mil/uL (ref 3.87–5.11)
RDW: 13.7 % (ref 11.5–15.5)
WBC: 7.2 10*3/uL (ref 4.0–10.5)

## 2016-07-23 LAB — HEMOGLOBIN A1C: Hgb A1c MFr Bld: 6.1 % (ref 4.6–6.5)

## 2016-07-23 LAB — BASIC METABOLIC PANEL
BUN: 17 mg/dL (ref 6–23)
CALCIUM: 9.1 mg/dL (ref 8.4–10.5)
CO2: 25 meq/L (ref 19–32)
Chloride: 106 mEq/L (ref 96–112)
Creatinine, Ser: 0.77 mg/dL (ref 0.40–1.20)
GFR: 85.08 mL/min (ref >60.00)
GLUCOSE: 98 mg/dL (ref 70–99)
Potassium: 4 mEq/L (ref 3.5–5.1)
Sodium: 138 mEq/L (ref 135–145)

## 2016-07-23 LAB — HEPATIC FUNCTION PANEL
ALK PHOS: 68 U/L (ref 39–117)
ALT: 15 U/L (ref 0–35)
AST: 14 U/L (ref 0–37)
Albumin: 4 g/dL (ref 3.5–5.2)
BILIRUBIN DIRECT: 0.1 mg/dL (ref 0.0–0.3)
Total Bilirubin: 0.4 mg/dL (ref 0.2–1.2)
Total Protein: 6.9 g/dL (ref 6.0–8.3)

## 2016-07-23 LAB — LIPID PANEL
CHOLESTEROL: 217 mg/dL — AB (ref 0–200)
HDL: 54.2 mg/dL (ref >39.00)
LDL Cholesterol: 147 mg/dL — ABNORMAL HIGH (ref 0–99)
NONHDL: 162.4
Total CHOL/HDL Ratio: 4
Triglycerides: 77 mg/dL (ref 0.0–149.0)
VLDL: 15.4 mg/dL (ref 0.0–40.0)

## 2016-07-23 LAB — TSH: TSH: 2.34 u[IU]/mL (ref 0.35–4.50)

## 2016-08-10 ENCOUNTER — Telehealth: Payer: Self-pay | Admitting: Family Medicine

## 2016-08-10 NOTE — Telephone Encounter (Signed)
Pt called and left a message on my machine requesting a refill of Hycodan cough syrup.  Stated she feels like she has a cold.  Tried to return the patient's call.  She will need to make an appt to get a refill of controlled cough suppressant.

## 2016-08-11 NOTE — Telephone Encounter (Signed)
°  Spoke with pt let her know she need an appt. Pt said she could not come in till Friday and may go urgent care because she need something before Friday. Pt said if she does not go to Urgent care she will call back and make an appt for Friday 08/13/16

## 2016-08-11 NOTE — Telephone Encounter (Signed)
Noted  

## 2016-08-11 NOTE — Telephone Encounter (Signed)
Please document what you told pt and what she said.  Thanks!!

## 2016-08-11 NOTE — Telephone Encounter (Signed)
Left a message for a return call.

## 2016-08-12 ENCOUNTER — Other Ambulatory Visit: Payer: Self-pay | Admitting: Internal Medicine

## 2016-08-13 ENCOUNTER — Encounter: Payer: Self-pay | Admitting: Family Medicine

## 2016-08-13 ENCOUNTER — Ambulatory Visit (INDEPENDENT_AMBULATORY_CARE_PROVIDER_SITE_OTHER): Payer: 59 | Admitting: Family Medicine

## 2016-08-13 VITALS — BP 140/80 | HR 93 | Temp 98.2°F | Ht 59.0 in | Wt 187.0 lb

## 2016-08-13 DIAGNOSIS — R05 Cough: Secondary | ICD-10-CM | POA: Diagnosis not present

## 2016-08-13 DIAGNOSIS — R059 Cough, unspecified: Secondary | ICD-10-CM

## 2016-08-13 MED ORDER — HYDROCODONE-HOMATROPINE 5-1.5 MG/5ML PO SYRP: 5.0000 mL | ORAL_SOLUTION | Freq: Four times a day (QID) | ORAL | 0 refills | Status: DC | PRN

## 2016-08-13 NOTE — Telephone Encounter (Signed)
Sent to the pharmacy by e-scribe.  Pt has upcoming cpx on 08/18/16

## 2016-08-13 NOTE — Patient Instructions (Signed)

## 2016-08-13 NOTE — Progress Notes (Signed)
Subjective:     Patient ID: Gail [name], female   DOB: [birth year], 50 y.o.   MRN: [id number]  HPI Acute visit for cough. Onset about one week ago. Patient is nonsmoker. Denies any fevers or chills. No sore throat. Minimal nasal congestion. No sick contacts. She's tried over-the-counter cough medication and cough drops without relief Cough is especially bothersome at night and frequently waking her. She has reported allergy to codeine with itching but has taken hydrocodone cough syrup in the past without difficulty.  Past Medical History:  Diagnosis Date  . Allergy    History of shots as a child  . Anal fissure   . Arthritis   . History of infertility, female   . Hyperlipidemia   . Hypertension   . IBS (irritable bowel syndrome)   . PCO (polycystic ovaries)   . Rosacea    Past Surgical History:  Procedure Laterality Date  . CARPAL TUNNEL RELEASE     x2 bilateral  . KNEE ARTHROSCOPY     left   . WISDOM TOOTH EXTRACTION    . wrist tendon release     bilateral thumbs    reports that she quit smoking about 29 years ago. Her smoking use included Cigarettes. She has a 3.50 pack-year smoking history. She has never used smokeless tobacco. She reports that she does not drink alcohol or use drugs. family history includes ALS in her father; Dementia in her maternal grandfather and paternal grandfather; Heart disease in her mother and paternal grandmother; Hypertension in her father and mother. Allergies  Allergen Reactions  . Ace Inhibitors Cough  . Codeine Itching  . Erythromycin Ethylsuccinate     Stomach hurts REACTION: unspecified   can take azithromycin  . Doxycycline Rash  . Latex Rash     Review of Systems  Constitutional: Negative for chills and fever.  HENT: Negative for sore throat.   Respiratory: Positive for cough. Negative for shortness of breath.        Objective:   Physical Exam  Constitutional: She appears well-developed and well-nourished.  HENT:   Right Ear: External ear normal.  Left Ear: External ear normal.  Mouth/Throat: Oropharynx is clear and moist.  Neck: Neck supple.  Cardiovascular: Normal rate and regular rhythm.   Pulmonary/Chest: Effort normal and breath sounds normal. No respiratory distress. She has no wheezes. She has no rales.  She has some pseudo-wheezing with expiration but no true wheezing  Lymphadenopathy:    She has no cervical adenopathy.       Assessment:     Cough. Suspect acute viral process. Nonfocal exam    Plan:     -Hycodan cough syrup 1 teaspoon every 6 hours for severe cough -Follow-up for any fever or other concerns  Eulas Post MD [facility]

## 2016-08-17 NOTE — Progress Notes (Signed)
Pre visit review using our clinic review tool, if applicable. No additional management support is needed unless otherwise documented below in the visit note.  Chief Complaint  Patient presents with  . Annual Exam    HPI: Patient  Gail [name]  50 y.o. comes in today for Preventive Health Care visit    bp controlled  No change in meds  ocass palpitations  Taking metformin  Qd ocass gi issues   Gabapentin  If  needed for cough  Also    Pain .  Back stiff in am    Cough some better   Had neg wu for  Microscopic hematuria seen at gyne exam   Health Maintenance  Topic Date Due  . INFLUENZA VACCINE  05/12/2017 (Originally 07/13/2016)  . HIV Screening  08/18/2017 (Originally 08/31/1983)  . PAP SMEAR  03/25/2019  . TETANUS/TDAP  11/26/2019   Health Maintenance Review LIFESTYLE:  Exercise:   Swimming some    Tobacco/ETS:  no Alcohol:  ocass to rare  Sugar beverages:  Mostly diet  drinks  Sleep:  6-8 hours  No osa  Drug use: no  hh of 2  Pet dog.   40 +++ work .  Had  Gyne check   Blood in urine and   Urology ct neg and neg cancer check .  Shots in spine  Weeks ago for  Back pain . No help .   Dr Mina Marble .     ROS:  Joints back stiff in am  GEN/ HEENT: No fever, significant weight changes sweats headaches vision problems hearing changes, CV/ PULM; No chest pain shortness of breath cough, syncope,edema  change in exercise tolerance. GI /GU: No adominal pain, vomiting, change in bowel habits. No blood in the stool. No significant GU symptoms. SKIN/HEME: ,no acute skin rashes suspicious lesions or bleeding. No lymphadenopathy, nodules, masses.  NEURO/ PSYCH:  No neurologic signs such as weakness numbness. No depression anxiety. IMM/ Allergy: No unusual infections.  Allergy .   REST of 12 system review negative except as per HPI   Past Medical History:  Diagnosis Date  . Allergy    History of shots as a child  . Anal fissure   . Arthritis   . History of infertility, female    . Hyperlipidemia   . Hypertension   . IBS (irritable bowel syndrome)   . Microscopic hematuria 2017   urology evaluated no sig cause    . PCO (polycystic ovaries)   . Rosacea     Past Surgical History:  Procedure Laterality Date  . CARPAL TUNNEL RELEASE     x2 bilateral  . KNEE ARTHROSCOPY     left   . WISDOM TOOTH EXTRACTION    . wrist tendon release     bilateral thumbs    Family History  Problem Relation Age of Onset  . Hypertension    . Lung cancer      uncle  . Breast cancer      great aunt  . Hypertension Mother   . Heart disease Mother   . Hypertension Father   . Dementia Maternal Grandfather   . Heart disease Paternal Grandmother   . Dementia Paternal Grandfather   . ALS Father     deceased    Social History   Social History  . Marital status: Married    Spouse name: N/A  . Number of children: 0  . Years of education: N/A   Occupational History  . INFANT TEACHER  [employer]   Social History Main Topics  . Smoking status: Former Smoker    Packs/day: 0.50    Years: 7.00    Types: Cigarettes    Quit date: 12/13/1986  . Smokeless tobacco: Never Used  . Alcohol use No  . Drug use: No  . Sexual activity: Not Asked     Comment: married   Other Topics Concern  . 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    Outpatient Medications Prior to Visit  Medication Sig Dispense Refill  . Cyanocobalamin (B-12) 1000 MCG SUBL Place under the tongue.    Marland Kitchen DILT-XR 240 MG 24 hr capsule TAKE 1 CAPSULE BY MOUTH DAILY. NEEDS APPOINTMENT FOR FUTURE REFILLS. 90 capsule 0  . fluticasone (FLONASE) 50 MCG/ACT nasal spray 2 sprays per nostril daily.  MUST HAVE OFFICE VISIT FOR FURTHER REFILLS. 16 g 0  . Lactobacillus (ACIDOPHILUS) 10 MG CAPS Take by mouth daily.     Marland Kitchen levonorgestrel  (MIRENA) 20 MCG/24HR IUD 1 each by Intrauterine route once.      Marland Kitchen losartan (COZAAR) 50 MG tablet TAKE 1 TABLET(50 MG) BY MOUTH DAILY 90 tablet 0  . metFORMIN (GLUCOPHAGE) 500 MG tablet Take 1 tablet (500 mg total) by mouth daily with breakfast. 90 tablet 3  . nystatin ointment (MYCOSTATIN) Apply topically as directed 30 g 1  . RESTASIS 0.05 % ophthalmic emulsion     . Zinc Oxide 30.6 % CREA Apply topically as needed.     Marland Kitchen HYDROcodone-homatropine (HYCODAN) 5-1.5 MG/5ML syrup Take 5 mLs by mouth every 6 (six) hours as needed. 120 mL 0   No facility-administered medications prior to visit.      EXAM:  BP 116/70 (BP Location: Left Arm, Patient Position: Sitting, Cuff Size: Large)   Temp 98.2 F (36.8 C) (Oral)   Ht 4' 11.5" (1.511 m)   Wt 187 lb (84.8 kg)   BMI 37.14 kg/m   Body mass index is 37.14 kg/m.  Physical Exam: Vital signs reviewed [id number] is a well-developed well-nourished alert cooperative    who appearsr stated age in no acute distress.  HEENT: normocephalic atraumatic , Eyes: PERRL EOM's full, conjunctiva clear, Nares: paten,t no deformity discharge or tenderness., Ears: no deformity EAC's clear TMs with normal landmarks. Mouth: clear OP, no lesions, edema.  Moist mucous membranes. Dentition in adequate repair. NECK: supple without masses, thyromegaly or bruits. CHEST/PULM:  Clear to auscultation and percussion breath sounds equal no wheeze , rales or rhonchi. No chest wall deformities or tenderness. CV: PMI is nondisplaced, S1 S2 no gallops, murmurs, rubs. Peripheral pulses are full without delay.No JVD . Breast: normal by inspection . No dimpling, discharge, masses, tenderness or discharge . ABDOMEN: Bowel sounds normal nontender  No guard or rebound, no hepato splenomegal no CVA tenderness.  No hernia. Extremtities:  No clubbing cyanosis or edema, no acute joint swelling or redness no focal atrophy NEURO:  Oriented x3, cranial nerves 3-12 appear to be intact, no  obvious focal weakness,gait within normal limits no abnormal reflexes or asymmetrical SKIN: No acute rashes normal turgor, color, no bruising or petechiae. Papule  left eye brow area  PSYCH: Oriented, good eye contact, no obvious depression anxiety, cognition and judgment  appear normal. LN: no cervical axillary inguinal adenopathy  Gyne note reviewed  Lab Results  Component Value Date   WBC 7.2 07/23/2016   HGB 14.3 07/23/2016   HCT 42.4 07/23/2016   PLT 210.0 07/23/2016   GLUCOSE 98 07/23/2016   CHOL 217 (H) 07/23/2016   TRIG 77.0 07/23/2016   HDL 54.20 07/23/2016   LDLDIRECT 160.7 12/10/2010   LDLCALC 147 (H) 07/23/2016   ALT 15 07/23/2016   AST 14 07/23/2016   NA 138 07/23/2016   K 4.0 07/23/2016   CL 106 07/23/2016   CREATININE 0.77 07/23/2016   BUN 17 07/23/2016   CO2 25 07/23/2016   TSH 2.34 07/23/2016   HGBA1C 6.1 07/23/2016    ASSESSMENT AND PLAN:  Discussed the following assessment and plan:  Visit for preventive health examination - to ge flu vaccine  at work   Essential hypertension - controlled   Medication management - metformin benefot more than risk   HLD (hyperlipidemia) - could be better   cont dis lsi  Fasting hyperglycemia - cont metformin   Low normal vitamin B12 level - ok last year  check at next a1c  Patient Care Team: Burnis Medin, MD as PCP - General (Internal Medicine) Satira Sark, MD (Cardiology) Cheri Fowler, MD as Consulting Physician (Obstetrics and Gynecology) Patient Instructions  Ok  to take ranitidine   Twice a day  Also  Gabapentin as needed with caution .    Continue metformin ok to take with meal dinner    Instead of after .  Plan check   b12 and  Hg a1c  In 6 months and then ROV .    Get YOur flu vaccine when available  bp is good today .      Standley Brooking. Flem Enderle M.D.

## 2016-08-18 ENCOUNTER — Ambulatory Visit (INDEPENDENT_AMBULATORY_CARE_PROVIDER_SITE_OTHER): Payer: 59 | Admitting: Internal Medicine

## 2016-08-18 ENCOUNTER — Encounter: Payer: Self-pay | Admitting: Internal Medicine

## 2016-08-18 VITALS — BP 116/70 | Temp 98.2°F | Ht 59.5 in | Wt 187.0 lb

## 2016-08-18 DIAGNOSIS — R7301 Impaired fasting glucose: Secondary | ICD-10-CM

## 2016-08-18 DIAGNOSIS — I10 Essential (primary) hypertension: Secondary | ICD-10-CM | POA: Diagnosis not present

## 2016-08-18 DIAGNOSIS — Z79899 Other long term (current) drug therapy: Secondary | ICD-10-CM | POA: Diagnosis not present

## 2016-08-18 DIAGNOSIS — Z0000 Encounter for general adult medical examination without abnormal findings: Secondary | ICD-10-CM

## 2016-08-18 DIAGNOSIS — E785 Hyperlipidemia, unspecified: Secondary | ICD-10-CM | POA: Diagnosis not present

## 2016-08-18 DIAGNOSIS — E538 Deficiency of other specified B group vitamins: Secondary | ICD-10-CM

## 2016-08-18 NOTE — Patient Instructions (Addendum)
Ok  to take ranitidine   Twice a day  Also  Gabapentin as needed with caution .    Continue metformin ok to take with meal dinner    Instead of after .  Plan check   b12 and  Hg a1c  In 6 months and then ROV .    Get YOur flu vaccine when available  bp is good today .

## 2016-08-27 ENCOUNTER — Ambulatory Visit (INDEPENDENT_AMBULATORY_CARE_PROVIDER_SITE_OTHER): Payer: 59 | Admitting: Family Medicine

## 2016-08-27 ENCOUNTER — Encounter: Payer: Self-pay | Admitting: Family Medicine

## 2016-08-27 VITALS — HR 97 | Temp 98.2°F | Wt 184.8 lb

## 2016-08-27 DIAGNOSIS — B373 Candidiasis of vulva and vagina: Secondary | ICD-10-CM

## 2016-08-27 DIAGNOSIS — B3731 Acute candidiasis of vulva and vagina: Secondary | ICD-10-CM

## 2016-08-27 MED ORDER — FLUCONAZOLE 150 MG PO TABS
ORAL_TABLET | ORAL | 0 refills | Status: DC
Start: 2016-08-27 — End: 2017-03-29

## 2016-08-27 NOTE — Progress Notes (Signed)
Subjective:     Patient ID: Gail [name], female   DOB: 11-16-1966, 50 y.o.   MRN: [id number]  HPI Patient seen as a work in with perianal and perineal itching She's had history of yeast vaginitis the past. She noticed some itching in her groin and vagina region couple days ago. She took some Benadryl and used some triamcinolone with minimal relief. She also tried some Desitin and nystatin without much improvement. Yesterday she noticed some itching around the rectum areas well. She has not had any recent diarrhea. She does swimming some for exercise. No recent change of toilet taper. She states that she had bacterial vaginosis about a month and half ago was treated for that. She's had some vaginal discharge Does have type 2 diabetes but apparently well-controlled  Past Medical History:  Diagnosis Date  . Allergy    History of shots as a child  . Anal fissure   . Arthritis   . History of infertility, female   . Hyperlipidemia   . Hypertension   . IBS (irritable bowel syndrome)   . Microscopic hematuria 2017   urology evaluated no sig cause    . PCO (polycystic ovaries)   . Rosacea    Past Surgical History:  Procedure Laterality Date  . CARPAL TUNNEL RELEASE     x2 bilateral  . KNEE ARTHROSCOPY     left   . WISDOM TOOTH EXTRACTION    . wrist tendon release     bilateral thumbs    reports that she quit smoking about 29 years ago. Her smoking use included Cigarettes. She has a 3.50 pack-year smoking history. She has never used smokeless tobacco. She reports that she does not drink alcohol or use drugs. family history includes ALS in her father; Dementia in her maternal grandfather and paternal grandfather; Heart disease in her mother and paternal grandmother; Hypertension in her father and mother. Allergies  Allergen Reactions  . Ace Inhibitors Cough  . Codeine Itching  . Erythromycin Ethylsuccinate     Stomach hurts REACTION: unspecified   can take azithromycin  .  Doxycycline Rash  . Latex Rash     Review of Systems  Genitourinary: Positive for vaginal discharge. Negative for dysuria.       Objective:   Physical Exam  Constitutional: She appears well-developed and well-nourished.  Cardiovascular: Normal rate and regular rhythm.   Genitourinary:  Genitourinary Comments: Very minimal erythema around the anal region. She has significant erythema in the perineum around the labia majora       Assessment:     Pruritus involving the perineum. Question Candida vaginitis    Plan:     -She will try over-the-counter Monistat or Gyne-Lotrimin -Fluconazole 150 mg and may repeat in 3 days as needed  Eulas Post MD [facility]

## 2016-08-27 NOTE — Patient Instructions (Signed)

## 2016-08-27 NOTE — Progress Notes (Signed)
Pre visit review using our clinic review tool, if applicable. No additional management support is needed unless otherwise documented below in the visit note. 

## 2016-08-28 ENCOUNTER — Ambulatory Visit: Payer: 59 | Admitting: Family Medicine

## 2016-09-17 ENCOUNTER — Telehealth: Payer: Self-pay | Admitting: Internal Medicine

## 2016-09-17 MED ORDER — NYSTATIN 100000 UNIT/GM EX OINT: TOPICAL_OINTMENT | CUTANEOUS | 0 refills | Status: DC

## 2016-09-17 NOTE — Telephone Encounter (Signed)
Sent to the pharmacy by e-scribe. 

## 2016-09-17 NOTE — Telephone Encounter (Signed)
Ok to refill nystatin   X 1

## 2016-09-17 NOTE — Telephone Encounter (Signed)
Pt need new Rx for nystatin   Pharm:  [facility] [place]

## 2016-09-17 NOTE — Telephone Encounter (Signed)
Please advise.  Seen by Dr. Elease Hashimoto on 08/27/16.  Seen pt in the office with her husband earlier this week.  Complaining of same sx.

## 2016-09-21 ENCOUNTER — Other Ambulatory Visit: Payer: Self-pay | Admitting: Internal Medicine

## 2016-09-23 NOTE — Telephone Encounter (Signed)
Sent to the pharmacy by e-scribe. 

## 2016-10-31 NOTE — Progress Notes (Signed)
Pre visit review using our clinic review tool, if applicable. No additional management support is needed unless otherwise documented below in the visit note.  Chief Complaint  Patient presents with  . Hypersomnolence  . Snoring    HPI: Gail [name] 50 y.o.  Reports that she feels that she has many the symptoms of obstructive sleep apnea or sleep disorders. Unrefreshed by sleep falling asleep sitting in a car for more than 10 minutes hard to stay awake at times in the afternoon takes 2 are sometimes on the weekend. Her husband says she's he snores loud no specific apnea symptoms. Her mom snores very loud but no diagnosis of sleep apnea. ROS: See pertinent positives and negatives per HPI.  bp has been good as well as palpitations but still gets ocassionally    Past Medical History:  Diagnosis Date  . Allergy    History of shots as a child  . Anal fissure   . Arthritis   . History of infertility, female   . Hyperlipidemia   . Hypertension   . IBS (irritable bowel syndrome)   . Microscopic hematuria 2017   urology evaluated no sig cause    . PCO (polycystic ovaries)   . Rosacea     Family History  Problem Relation Age of Onset  . Hypertension    . Lung cancer      uncle  . Breast cancer      great aunt  . Hypertension Mother   . Heart disease Mother   . Hypertension Father   . Dementia Maternal Grandfather   . Heart disease Paternal Grandmother   . Dementia Paternal Grandfather   . ALS Father     deceased    Social History   Social History  . Marital status: Married    Spouse name: N/A  . Number of children: 0  . Years of education: N/A   Occupational History  . 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 Smoking status: Former Smoker    Packs/day: 0.50    Years: 7.00    Types: Cigarettes    Quit date: 12/13/1986  . Smokeless tobacco: Never Used  . Alcohol use No  . Drug use: No  . Sexual activity: Not Asked     Comment:  married   Other Topics Concern  . 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    Outpatient Medications Prior to Visit  Medication Sig Dispense Refill  . Cyanocobalamin (B-12) 1000 MCG SUBL Place under the tongue.    Marland Kitchen DILT-XR 240 MG 24 hr capsule TAKE 1 CAPSULE BY MOUTH DAILY. NEEDS APPOINTMENT FOR FUTURE REFILLS. 90 capsule 1  . fluconazole (DIFLUCAN) 150 MG tablet Take one tablet and may repeat once in 3 days as needed 2 tablet 0  . fluticasone (FLONASE) 50 MCG/ACT nasal spray 2 sprays per nostril daily.  MUST HAVE OFFICE VISIT FOR FURTHER REFILLS. 16 g 0  . Lactobacillus (ACIDOPHILUS) 10 MG CAPS Take by mouth daily.     Marland Kitchen levonorgestrel (MIRENA) 20 MCG/24HR IUD 1 each by Intrauterine route once.      Marland Kitchen losartan (COZAAR) 50 MG tablet TAKE 1 TABLET(50 MG) BY MOUTH DAILY 90  tablet 0  . metFORMIN (GLUCOPHAGE) 500 MG tablet Take 1 tablet (500 mg total) by mouth daily with breakfast. 90 tablet 3  . metFORMIN (GLUCOPHAGE) 500 MG tablet TAKE 1 TABLET BY MOUTH DAILY WITH BREAKFAST 90 tablet 1  . nystatin ointment (MYCOSTATIN) Apply topically as directed 30 g 0  . RESTASIS 0.05 % ophthalmic emulsion     . Zinc Oxide 30.6 % CREA Apply topically as needed.      No facility-administered medications prior to visit.      EXAM:  BP 138/80 (BP Location: Right Arm)   Temp 98.3 F (36.8 C) (Oral)   Wt 183 lb 6.4 oz (83.2 kg)   BMI 36.42 kg/m   Body mass index is 36.42 kg/m. Repeat bp 138/80large  and 136/88 reg GENERAL: vitals reviewed and listed above, alert, oriented, appears well hydrated and in no acute distress HEENT: atraumatic, conjunctiva  clear, no obvious abnormalities on inspection of external nose and ears PSYCH: pleasant and cooperative, no obvious depression or anxiety  ASSESSMENT AND PLAN:  Discussed  the following assessment and plan:  Snoring - Plan: Ambulatory referral to Pulmonology  Unrefreshed by sleep - Plan: Ambulatory referral to Pulmonology  Sleepiness - Plan: Ambulatory referral to Pulmonology  Essential hypertension - has been controlled  Sleep disordered   Problem or  Sleep apnea certainly possible    . With sleepiness  Even if does get enough hours .  Plan  referral disc   Counseled.  -Patient advised to return or notify health care team  if symptoms worsen ,persist or new concerns arise. Total visit 17mins > 50% spent counseling and coordinating care as indicated in above note and in instructions to patient .   Patient Instructions  I agree that a referral to evaluate for sleep apnea or sleep disorder is indicated based on her symptoms. Someone will contact you about a consult.   Sleep Apnea Sleep apnea is a condition in which breathing pauses or becomes shallow during sleep. Episodes of sleep apnea usually last 10 seconds or longer, and they may occur as many as 20 times an hour. Sleep apnea disrupts your sleep and keeps your body from getting the rest that it needs. This condition can increase your risk of certain health problems, including:  Heart attack.  Stroke.  Obesity.  Diabetes.  Heart failure.  Irregular heartbeat. There are three kinds of sleep apnea:  Obstructive sleep apnea. This kind is caused by a blocked or collapsed airway.  Central sleep apnea. This kind happens when the part of the brain that controls breathing does not send the correct signals to the muscles that control breathing.  Mixed sleep apnea. This is a combination of obstructive and central sleep apnea. What are the causes? The most common cause of this condition is a collapsed or blocked airway. An airway can collapse or become blocked if:  Your throat muscles are abnormally relaxed.  Your tongue and tonsils are larger than normal.  You are overweight.  Your airway is  smaller than normal. What increases the risk? This condition is more likely to develop in people who:  Are overweight.  Smoke.  Have a smaller than normal airway.  Are elderly.  Are female.  Drink alcohol.  Take sedatives or tranquilizers.  Have a family history of sleep apnea. What are the signs or symptoms? Symptoms of this condition include:  Trouble staying asleep.  Daytime sleepiness and tiredness.  Irritability.  Loud snoring.  Morning headaches.  Trouble  concentrating.  Forgetfulness.  Decreased interest in sex.  Unexplained sleepiness.  Mood swings.  Personality changes.  Feelings of depression.  Waking up often during the night to urinate.  Dry mouth.  Sore throat. How is this diagnosed? This condition may be diagnosed with:  A medical history.  A physical exam.  A series of tests that are done while you are sleeping (sleep study). These tests are usually done in a sleep lab, but they may also be done at home. How is this treated? Treatment for this condition aims to restore normal breathing and to ease symptoms during sleep. It may involve managing health issues that can affect breathing, such as high blood pressure or obesity. Treatment may include:  Sleeping on your side.  Using a decongestant if you have nasal congestion.  Avoiding the use of depressants, including alcohol, sedatives, and narcotics.  Losing weight if you are overweight.  Making changes to your diet.  Quitting smoking.  Using a device to open your airway while you sleep, such as:  An oral appliance. This is a custom-made mouthpiece that shifts your lower jaw forward.  A continuous positive airway pressure (CPAP) device. This device delivers oxygen to your airway through a mask.  A nasal expiratory positive airway pressure (EPAP) device. This device has valves that you put into each nostril.  A bi-level positive airway pressure (BPAP) device. This device  delivers oxygen to your airway through a mask.  Surgery if other treatments do not work. During surgery, excess tissue is removed to create a wider airway. It is important to get treatment for sleep apnea. Without treatment, this condition can lead to:  High blood pressure.  Coronary artery disease.  (Men) An inability to achieve or maintain an erection (impotence).  Reduced thinking abilities. Follow these instructions at home:  Make any lifestyle changes that your health care provider recommends.  Eat a healthy, well-balanced diet.  Take over-the-counter and prescription medicines only as told by your health care provider.  Avoid using depressants, including alcohol, sedatives, and narcotics.  Take steps to lose weight if you are overweight.  If you were given a device to open your airway while you sleep, use it only as told by your health care provider.  Do not use any tobacco products, such as cigarettes, chewing tobacco, and e-cigarettes. If you need help quitting, ask your health care provider.  Keep all follow-up visits as told by your health care provider. This is important. Contact a health care provider if:  The device that you received to open your airway during sleep is uncomfortable or does not seem to be working.  Your symptoms do not improve.  Your symptoms get worse. Get help right away if:  You develop chest pain.  You develop shortness of breath.  You develop discomfort in your back, arms, or stomach.  You have trouble speaking.  You have weakness on one side of your body.  You have drooping in your face. These symptoms may represent a serious problem that is an emergency. Do not wait to see if the symptoms will go away. Get medical help right away. Call your local emergency services (911 in the [place] Do not drive yourself to the hospital.  This information is not intended to replace advice given to you by your health care provider. Make sure you  discuss any questions you have with your health care provider. Document Released: 11/19/2002 Document Revised: 07/25/2016 Document Reviewed: 09/08/2015 Elsevier Interactive Patient Education  2017  [place] Keirah Konitzer M.D.

## 2016-11-01 ENCOUNTER — Ambulatory Visit (INDEPENDENT_AMBULATORY_CARE_PROVIDER_SITE_OTHER): Payer: 59 | Admitting: Internal Medicine

## 2016-11-01 ENCOUNTER — Encounter: Payer: Self-pay | Admitting: Internal Medicine

## 2016-11-01 VITALS — BP 138/80 | Temp 98.3°F | Wt 183.4 lb

## 2016-11-01 DIAGNOSIS — R0683 Snoring: Secondary | ICD-10-CM | POA: Diagnosis not present

## 2016-11-01 DIAGNOSIS — G478 Other sleep disorders: Secondary | ICD-10-CM

## 2016-11-01 DIAGNOSIS — R400 Somnolence: Secondary | ICD-10-CM | POA: Diagnosis not present

## 2016-11-01 DIAGNOSIS — I10 Essential (primary) hypertension: Secondary | ICD-10-CM | POA: Diagnosis not present

## 2016-11-01 NOTE — Patient Instructions (Signed)
I agree that a referral to evaluate for sleep apnea or sleep disorder is indicated based on her symptoms. Someone will contact you about a consult.   Sleep Apnea Sleep apnea is a condition in which breathing pauses or becomes shallow during sleep. Episodes of sleep apnea usually last 10 seconds or longer, and they may occur as many as 20 times an hour. Sleep apnea disrupts your sleep and keeps your body from getting the rest that it needs. This condition can increase your risk of certain health problems, including:  Heart attack.  Stroke.  Obesity.  Diabetes.  Heart failure.  Irregular heartbeat. There are three kinds of sleep apnea:  Obstructive sleep apnea. This kind is caused by a blocked or collapsed airway.  Central sleep apnea. This kind happens when the part of the brain that controls breathing does not send the correct signals to the muscles that control breathing.  Mixed sleep apnea. This is a combination of obstructive and central sleep apnea. What are the causes? The most common cause of this condition is a collapsed or blocked airway. An airway can collapse or become blocked if:  Your throat muscles are abnormally relaxed.  Your tongue and tonsils are larger than normal.  You are overweight.  Your airway is smaller than normal. What increases the risk? This condition is more likely to develop in people who:  Are overweight.  Smoke.  Have a smaller than normal airway.  Are elderly.  Are female.  Drink alcohol.  Take sedatives or tranquilizers.  Have a family history of sleep apnea. What are the signs or symptoms? Symptoms of this condition include:  Trouble staying asleep.  Daytime sleepiness and tiredness.  Irritability.  Loud snoring.  Morning headaches.  Trouble concentrating.  Forgetfulness.  Decreased interest in sex.  Unexplained sleepiness.  Mood swings.  Personality changes.  Feelings of depression.  Waking up often  during the night to urinate.  Dry mouth.  Sore throat. How is this diagnosed? This condition may be diagnosed with:  A medical history.  A physical exam.  A series of tests that are done while you are sleeping (sleep study). These tests are usually done in a sleep lab, but they may also be done at home. How is this treated? Treatment for this condition aims to restore normal breathing and to ease symptoms during sleep. It may involve managing health issues that can affect breathing, such as high blood pressure or obesity. Treatment may include:  Sleeping on your side.  Using a decongestant if you have nasal congestion.  Avoiding the use of depressants, including alcohol, sedatives, and narcotics.  Losing weight if you are overweight.  Making changes to your diet.  Quitting smoking.  Using a device to open your airway while you sleep, such as:  An oral appliance. This is a custom-made mouthpiece that shifts your lower jaw forward.  A continuous positive airway pressure (CPAP) device. This device delivers oxygen to your airway through a mask.  A nasal expiratory positive airway pressure (EPAP) device. This device has valves that you put into each nostril.  A bi-level positive airway pressure (BPAP) device. This device delivers oxygen to your airway through a mask.  Surgery if other treatments do not work. During surgery, excess tissue is removed to create a wider airway. It is important to get treatment for sleep apnea. Without treatment, this condition can lead to:  High blood pressure.  Coronary artery disease.  (Men) An inability to achieve or maintain  an erection (impotence).  Reduced thinking abilities. Follow these instructions at home:  Make any lifestyle changes that your health care provider recommends.  Eat a healthy, well-balanced diet.  Take over-the-counter and prescription medicines only as told by your health care provider.  Avoid using  depressants, including alcohol, sedatives, and narcotics.  Take steps to lose weight if you are overweight.  If you were given a device to open your airway while you sleep, use it only as told by your health care provider.  Do not use any tobacco products, such as cigarettes, chewing tobacco, and e-cigarettes. If you need help quitting, ask your health care provider.  Keep all follow-up visits as told by your health care provider. This is important. Contact a health care provider if:  The device that you received to open your airway during sleep is uncomfortable or does not seem to be working.  Your symptoms do not improve.  Your symptoms get worse. Get help right away if:  You develop chest pain.  You develop shortness of breath.  You develop discomfort in your back, arms, or stomach.  You have trouble speaking.  You have weakness on one side of your body.  You have drooping in your face. These symptoms may represent a serious problem that is an emergency. Do not wait to see if the symptoms will go away. Get medical help right away. Call your local emergency services (911 in the [place] Do not drive yourself to the hospital.  This information is not intended to replace advice given to you by your health care provider. Make sure you discuss any questions you have with your health care provider. Document Released: 11/19/2002 Document Revised: 07/25/2016 Document Reviewed: 09/08/2015 Elsevier Interactive Patient Education  2017 [employer].

## 2016-11-21 ENCOUNTER — Other Ambulatory Visit: Payer: Self-pay | Admitting: Internal Medicine

## 2016-11-23 ENCOUNTER — Telehealth: Payer: Self-pay | Admitting: Family Medicine

## 2016-11-23 ENCOUNTER — Other Ambulatory Visit: Payer: Self-pay | Admitting: Family Medicine

## 2016-11-23 DIAGNOSIS — E538 Deficiency of other specified B group vitamins: Secondary | ICD-10-CM

## 2016-11-23 DIAGNOSIS — R7309 Other abnormal glucose: Secondary | ICD-10-CM

## 2016-11-23 NOTE — Telephone Encounter (Signed)
Pt is due for lab work and follow up in March.  I have placed the lab orders. She does not need to fast. Please help her to make both appointments.  Thanks!!

## 2016-11-23 NOTE — Telephone Encounter (Signed)
lmom for pt to call back

## 2016-11-23 NOTE — Telephone Encounter (Signed)
Sent to the pharmacy by e-scribe for 3 months.  Pt to return in March for follow up and lab work before visit.  Message sent to scheduling.

## 2016-11-23 NOTE — Telephone Encounter (Signed)
Pt has been scheduled.  °

## 2016-12-16 ENCOUNTER — Encounter: Payer: Self-pay | Admitting: Internal Medicine

## 2016-12-27 ENCOUNTER — Other Ambulatory Visit: Payer: Self-pay | Admitting: Internal Medicine

## 2016-12-28 MED ORDER — LOSARTAN POTASSIUM 50 MG PO TABS: 50.0000 mg | ORAL_TABLET | Freq: Every day | ORAL | 1 refills | Status: DC

## 2016-12-28 MED ORDER — NYSTATIN 100000 UNIT/GM EX OINT: TOPICAL_OINTMENT | CUTANEOUS | 1 refills | Status: DC

## 2016-12-28 NOTE — Telephone Encounter (Signed)
Ok to refill cozaar for 6  Months   nysatatin x 2

## 2016-12-28 NOTE — Telephone Encounter (Signed)
Sent to the pharmacy by e-scribe. 

## 2017-01-03 ENCOUNTER — Encounter: Payer: Self-pay | Admitting: Pulmonary Disease

## 2017-01-03 ENCOUNTER — Ambulatory Visit (INDEPENDENT_AMBULATORY_CARE_PROVIDER_SITE_OTHER): Payer: BLUE CROSS/BLUE SHIELD | Admitting: Pulmonary Disease

## 2017-01-03 VITALS — BP 136/96 | HR 116 | Ht 59.5 in | Wt 185.2 lb

## 2017-01-03 DIAGNOSIS — E669 Obesity, unspecified: Secondary | ICD-10-CM

## 2017-01-03 DIAGNOSIS — G4710 Hypersomnia, unspecified: Secondary | ICD-10-CM

## 2017-01-03 NOTE — Progress Notes (Signed)
Subjective:    Patient ID: Gail [name], female    DOB: 1966/10/29, 51 y.o.   MRN: [id number]  HPI   This is the case of Gail [name], 51 y.o. Female, who was referred by Dr. Shanon Ace  in consultation regarding possible OSA.   As you very well know, patient has snoring, gasping, choking, has tiredness all day. Non smoker, no asthma or copd.  She has hypersomnia affecting her fxnality. She has HTN since 51 yo. She sleeps 7 hrs/night.  Wakes up unrefreshed in am.  Naps daily. Very symptomatic.   ESS 17.   She is very symptomatic. She is a Aeronautical engineer. Gets sleepy at work.   (-) abnormal behavior in sleep.      Review of Systems  Constitutional: Negative.  Negative for fever and unexpected weight change.  HENT: Positive for congestion and sneezing. Negative for dental problem, ear pain, nosebleeds, postnasal drip, rhinorrhea, sinus pressure, sore throat and trouble swallowing.   Eyes: Negative.  Negative for redness and itching.  Respiratory: Positive for cough and shortness of breath. Negative for chest tightness and wheezing.   Cardiovascular: Negative.  Negative for palpitations and leg swelling.  Gastrointestinal: Negative.  Negative for nausea and vomiting.  Endocrine: Negative.   Genitourinary: Negative.  Negative for dysuria.  Musculoskeletal: Negative.  Negative for joint swelling.  Skin: Negative.  Negative for rash.  Allergic/Immunologic: Positive for environmental allergies.  Neurological: Negative.  Negative for headaches.  Hematological: Bruises/bleeds easily.  Psychiatric/Behavioral: Negative.  Negative for dysphoric mood. The patient is not nervous/anxious.    Past Medical History:  Diagnosis Date  . Allergy    History of shots as a child  . Anal fissure   . Arthritis   . History of infertility, female   . Hyperlipidemia   . Hypertension   . IBS (irritable bowel syndrome)   . Microscopic hematuria 2017   urology evaluated no sig cause      . PCO (polycystic ovaries)   . Rosacea    (-) CA, DVT  Family History  Problem Relation Age of Onset  . Hypertension    . Lung cancer      uncle  . Breast cancer      great aunt  . Hypertension Mother   . Heart disease Mother   . Hypertension Father   . Dementia Maternal Grandfather   . Heart disease Paternal Grandmother   . Dementia Paternal Grandfather   . ALS Father     deceased     Past Surgical History:  Procedure Laterality Date  . CARPAL TUNNEL RELEASE     x2 bilateral  . KNEE ARTHROSCOPY     left   . WISDOM TOOTH EXTRACTION    . wrist tendon release     bilateral thumbs    Social History   Social History  . Marital status: Married    Spouse name: N/A  . Number of children: 0  . Years of education: N/A   Occupational History  . 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 Smoking status: Former Smoker    Packs/day: 0.50    Years: 7.00    Types: Cigarettes    Quit date: 12/13/1986  . Smokeless tobacco: Never Used  . Alcohol use No  . Drug use: No  . Sexual activity: Not on file     Comment: married   Other Topics Concern  . 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     Allergies  Allergen Reactions  . Ace Inhibitors Cough  . Codeine Itching  . Erythromycin Ethylsuccinate     Stomach hurts REACTION: unspecified   can take azithromycin  . Doxycycline Rash  . Latex Rash     Outpatient Medications Prior to Visit  Medication Sig Dispense Refill  . Cyanocobalamin (B-12) 1000 MCG SUBL Place under the tongue.    Marland Kitchen DILT-XR 240 MG 24 hr capsule TAKE 1 CAPSULE BY MOUTH DAILY. NEEDS APPOINTMENT FOR FUTURE REFILLS. 90 capsule 1  . fluticasone (FLONASE) 50 MCG/ACT nasal spray 2 sprays per nostril daily.  MUST HAVE OFFICE VISIT FOR FURTHER  REFILLS. 16 g 0  . Lactobacillus (ACIDOPHILUS) 10 MG CAPS Take by mouth daily.     Marland Kitchen levonorgestrel (MIRENA) 20 MCG/24HR IUD 1 each by Intrauterine route once.      Marland Kitchen losartan (COZAAR) 50 MG tablet Take 1 tablet (50 mg total) by mouth daily. 90 tablet 1  . metFORMIN (GLUCOPHAGE) 500 MG tablet Take 1 tablet (500 mg total) by mouth daily with breakfast. 90 tablet 3  . metFORMIN (GLUCOPHAGE) 500 MG tablet TAKE 1 TABLET BY MOUTH DAILY WITH BREAKFAST 90 tablet 1  . nystatin ointment (MYCOSTATIN) Apply topically as directed 30 g 1  . RESTASIS 0.05 % ophthalmic emulsion     . Zinc Oxide 30.6 % CREA Apply topically as needed.     . fluconazole (DIFLUCAN) 150 MG tablet Take one tablet and may repeat once in 3 days as needed (Patient not taking: Reported on 01/03/2017) 2 tablet 0   No facility-administered medications prior to visit.    No orders of the defined types were placed in this encounter.       Objective:   Physical Exam   Vitals:  Vitals:   01/03/17 1555  BP: (!) 136/96  Pulse: (!) 116  SpO2: 97%  Weight: 185 lb 3.2 oz (84 kg)  Height: 4' 11.5" (1.511 m)    Constitutional/General:  Pleasant, well-nourished, well-developed, not in any distress,  Comfortably seating.  Well kempt  Body mass index is 36.78 kg/m. Wt Readings from Last 3 Encounters:  01/03/17 185 lb 3.2 oz (84 kg)  11/01/16 183 lb 6.4 oz (83.2 kg)  08/27/16 184 lb 12.8 oz (83.8 kg)     HEENT: Pupils equal and reactive to light and accommodation. Anicteric sclerae. Normal nasal mucosa.   No oral  lesions,  mouth clear,  oropharynx clear, no postnasal drip. (-) Oral thrush. No dental caries.  Airway - Mallampati class IV  Neck: No masses. Midline trachea. No JVD, (-) LAD. (-) bruits appreciated.  Respiratory/Chest: Grossly normal chest. (-) deformity. (-) Accessory muscle use.  Symmetric expansion. (-) Tenderness on palpation.  Resonant on percussion.  Diminished BS on both lower lung zones. (-)  wheezing, crackles, rhonchi (-) egophony  Cardiovascular: Regular rate and  rhythm, heart sounds normal, no murmur or gallops, no peripheral edema  Gastrointestinal:  Normal bowel sounds. Soft, non-tender. No hepatosplenomegaly.  (-) masses.   Musculoskeletal:  Normal muscle tone. Normal gait.   Extremities: Grossly normal. (-) clubbing, cyanosis.  (-) edema        Skin: (-) rash,lesions seen.  Neurological/Psychiatric : alert, oriented to time, place, person. Normal mood and affect         Assessment & Plan:  Hypersomnia patient has snoring, gasping, choking, has tiredness all day. Non smoker, no asthma or copd.  She has hypersomnia affecting her fxnality. She has HTN since 51 yo. She sleeps 7 hrs/night.  Wakes up unrefreshed in am.  Naps daily. Very symptomatic.   ESS 17.   She is very symptomatic. She is a Aeronautical engineer. Gets sleepy at work.   (-) abnormal behavior in sleep.    Plan :  We discussed about the diagnosis of Obstructive Sleep Apnea (OSA) and implications of untreated OSA. We discussed about CPAP and BiPaP as possible treatment options.    We will schedule the patient for a sleep study. Plan for a home sleep study. She is very symptomatic. Likely moderate to severe sleep apnea. Anticipate no issues with CPAP. She has a friend (elderly with dementia) who was diagnosed with sleep apnea recently.   Patient was instructed to call the office if he/she has not heard back from the office 1-2 weeks after the sleep study.   Patient was instructed to call the office if he/she is having issues with the PAP device.   We discussed good sleep hygiene.   Patient was advised not to engage in activities requiring concentration and/or vigilance if he/she is sleepy.  Patient was advised not to drive if he/she is sleepy.    Obesity Weight reduction     Thank you very much for letting me participate in this patient's care. Please do not hesitate to give me a  call if you have any questions or concerns regarding the treatment plan.   Patient will follow up with me in 8-10 weeks.     Monica Becton, MD 01/03/2017   4:37 PM [facility] [place] Pager: [phone] Office: [phone], Fax: [phone]

## 2017-01-03 NOTE — Assessment & Plan Note (Signed)
Weight reduction 

## 2017-01-03 NOTE — Patient Instructions (Signed)
It was a pleasure taking care of you today!  We will schedule you to have a sleep study to determine if you have sleep apnea.   We will get a home sleep test.  You will be instructed to come back to the office to get an apparatus to sleep with overnight.  Once we have the apparatus, it will usually take us 1-2 weeks to read the study and get back at you with results of the test.  Please give us a call in 2 weeks after your study if you do not hear back from us.    If the sleep study is positive, we will order you a CPAP  machine.  Please call the office if you do NOT receive your machine in the next 1-2 weeks.   Please make sure you use your CPAP device everytime you sleep.  We will monitor the usage of your machine per your insurance requirement.  Your insurance company may take the machine from you if you are not using it regularly.   Please clean the mask, tubings, filter, water reservoir with soapy water every week.  Please use distilled water for the water reservoir.   Please call the office or your machine provider (DME company) if you are having issues with the device.   Return to clinic in 8-10 weeks with Dr. De Dios or NP    

## 2017-01-03 NOTE — Assessment & Plan Note (Signed)
patient has snoring, gasping, choking, has tiredness all day. Non smoker, no asthma or copd.  She has hypersomnia affecting her fxnality. She has HTN since 51 yo. She sleeps 7 hrs/night.  Wakes up unrefreshed in am.  Naps daily. Very symptomatic.   ESS 17.   She is very symptomatic. She is a Aeronautical engineer. Gets sleepy at work.   (-) abnormal behavior in sleep.    Plan :  We discussed about the diagnosis of Obstructive Sleep Apnea (OSA) and implications of untreated OSA. We discussed about CPAP and BiPaP as possible treatment options.    We will schedule the patient for a sleep study. Plan for a home sleep study. She is very symptomatic. Likely moderate to severe sleep apnea. Anticipate no issues with CPAP. She has a friend (elderly with dementia) who was diagnosed with sleep apnea recently.   Patient was instructed to call the office if he/she has not heard back from the office 1-2 weeks after the sleep study.   Patient was instructed to call the office if he/she is having issues with the PAP device.   We discussed good sleep hygiene.   Patient was advised not to engage in activities requiring concentration and/or vigilance if he/she is sleepy.  Patient was advised not to drive if he/she is sleepy.

## 2017-01-10 ENCOUNTER — Telehealth: Payer: Self-pay | Admitting: Pulmonary Disease

## 2017-01-10 NOTE — Telephone Encounter (Signed)
Spoke with pt, she states nobody called her to set up sleep study. I will check on this to see whats going on and call pt back. Can we check on this for pt?

## 2017-01-11 ENCOUNTER — Other Ambulatory Visit: Payer: Self-pay | Admitting: Internal Medicine

## 2017-01-11 DIAGNOSIS — Z1231 Encounter for screening mammogram for malignant neoplasm of breast: Secondary | ICD-10-CM

## 2017-01-11 NOTE — Telephone Encounter (Signed)
Called pt to schedule hst.  Had to leave vm for her to call me back.

## 2017-01-11 NOTE — Telephone Encounter (Signed)
Per chart, home sleep test was ordered on 01/03/2017.  [facility]'s please advise on status of HST order.  Thanks!

## 2017-01-12 NOTE — Telephone Encounter (Signed)
Spoke to pt & scheduled her hst.  Nothing further is needed.

## 2017-01-25 DIAGNOSIS — G4733 Obstructive sleep apnea (adult) (pediatric): Secondary | ICD-10-CM | POA: Diagnosis not present

## 2017-01-31 ENCOUNTER — Telehealth: Payer: Self-pay | Admitting: Pulmonary Disease

## 2017-01-31 DIAGNOSIS — G4733 Obstructive sleep apnea (adult) (pediatric): Secondary | ICD-10-CM

## 2017-01-31 NOTE — Telephone Encounter (Signed)
Spoke with patient and informed her of results. Pt states she wanted to contact her insurance to ensure they will cover the sleep study. Pt stated she will contact office after speaking with insurance.

## 2017-01-31 NOTE — Telephone Encounter (Signed)
Spoke with pt, asking for CPT code for sleep study. Gail [name] please advise on CPT code.  Thanks!

## 2017-01-31 NOTE — Telephone Encounter (Signed)
Patient states she needs CPT code for insurance.  CB is [phone].

## 2017-01-31 NOTE — Telephone Encounter (Signed)
    Please call the pt and tell the pt the [address]  did not show OSA.   Pt stops breathing 3   times an hour.   Home sleep study was done on : 01/25/17  My recommendation is to do a lab sleep study so we can rule out OSA altogether.  pls ask pt if she is OK with this. Le me know what she decides on. Thanks.       Monica Becton, MD 01/31/2017, 2:37 PM

## 2017-02-01 DIAGNOSIS — G4733 Obstructive sleep apnea (adult) (pediatric): Secondary | ICD-10-CM | POA: Diagnosis not present

## 2017-02-01 NOTE — Telephone Encounter (Signed)
The CPT for the in-lab sleep study will be [id number].

## 2017-02-01 NOTE — Telephone Encounter (Signed)
lmomtcb x1 

## 2017-02-02 ENCOUNTER — Ambulatory Visit
Admission: RE | Admit: 2017-02-02 | Discharge: 2017-02-02 | Disposition: A | Discharge status: Home / Self Care | Payer: BLUE CROSS/BLUE SHIELD | Source: Ambulatory Visit | Attending: Internal Medicine | Admitting: Internal Medicine

## 2017-02-02 ENCOUNTER — Other Ambulatory Visit: Payer: Self-pay | Admitting: *Deleted

## 2017-02-02 DIAGNOSIS — Z1231 Encounter for screening mammogram for malignant neoplasm of breast: Secondary | ICD-10-CM | POA: Diagnosis not present

## 2017-02-02 DIAGNOSIS — G4710 Hypersomnia, unspecified: Secondary | ICD-10-CM

## 2017-02-03 NOTE — Telephone Encounter (Addendum)
Spoke with pt and made her aware of the CPT code, Pt is concerned about the cost. She states it is too expensive then she will have to hold off on the sleep study. Placed the order so [facility] can start the pre-cert. Will follow up with pt to see if she could afford the study

## 2017-02-23 ENCOUNTER — Other Ambulatory Visit (INDEPENDENT_AMBULATORY_CARE_PROVIDER_SITE_OTHER): Payer: BLUE CROSS/BLUE SHIELD

## 2017-02-23 DIAGNOSIS — R7309 Other abnormal glucose: Secondary | ICD-10-CM

## 2017-02-23 DIAGNOSIS — E538 Deficiency of other specified B group vitamins: Secondary | ICD-10-CM

## 2017-02-24 LAB — HEMOGLOBIN A1C: Hgb A1c MFr Bld: 5.9 % (ref 4.6–6.5)

## 2017-02-24 LAB — VITAMIN B12: Vitamin B-12: 1399 pg/mL — ABNORMAL HIGH (ref 211–911)

## 2017-03-02 ENCOUNTER — Encounter: Payer: Self-pay | Admitting: Internal Medicine

## 2017-03-02 ENCOUNTER — Ambulatory Visit (INDEPENDENT_AMBULATORY_CARE_PROVIDER_SITE_OTHER): Payer: BLUE CROSS/BLUE SHIELD | Admitting: Internal Medicine

## 2017-03-02 VITALS — BP 132/88 | Temp 98.2°F | Wt 187.0 lb

## 2017-03-02 DIAGNOSIS — Z79899 Other long term (current) drug therapy: Secondary | ICD-10-CM

## 2017-03-02 DIAGNOSIS — R51 Headache: Secondary | ICD-10-CM

## 2017-03-02 DIAGNOSIS — E538 Deficiency of other specified B group vitamins: Secondary | ICD-10-CM | POA: Diagnosis not present

## 2017-03-02 DIAGNOSIS — R739 Hyperglycemia, unspecified: Secondary | ICD-10-CM

## 2017-03-02 DIAGNOSIS — E282 Polycystic ovarian syndrome: Secondary | ICD-10-CM

## 2017-03-02 DIAGNOSIS — R7989 Other specified abnormal findings of blood chemistry: Secondary | ICD-10-CM

## 2017-03-02 DIAGNOSIS — R519 Headache, unspecified: Secondary | ICD-10-CM

## 2017-03-02 MED ORDER — METFORMIN HCL ER 500 MG PO TB24: ORAL_TABLET | ORAL | 3 refills | Status: DC

## 2017-03-02 NOTE — Progress Notes (Signed)
No chief complaint on file.   HPI: Gail [name] 51 y.o. come in for Chronic disease management  Since last visit she is under evaluation for her snoring question sleep apnea. Taking metformin once a day occasionally forgets. Occasional diarrhea has heard that it can mess with her stomach but she does not have GI symptoms every day. Headache just under her eyes for a few weeks usually begins at the middle the day could be stress to take care of 2 special needs children. No fever associated some nasal congestion right ear squishy at times.   ROS: See pertinent positives and negatives per HPI.no fever    Past Medical History:  Diagnosis Date  . Allergy    History of shots as a child  . Anal fissure   . Arthritis   . History of infertility, female   . Hyperlipidemia   . Hypertension   . IBS (irritable bowel syndrome)   . Microscopic hematuria 2017   urology evaluated no sig cause    . PCO (polycystic ovaries)   . Rosacea     Family History  Problem Relation Age of Onset  . Hypertension Mother   . Heart disease Mother   . Hypertension Father   . ALS Father     deceased  . Dementia Maternal Grandfather   . Heart disease Paternal Grandmother   . Dementia Paternal Grandfather   . Hypertension    . Lung cancer      uncle  . Breast cancer      great aunt    Social History   Social History  . Marital status: Married    Spouse name: N/A  . Number of children: 0  . Years of education: N/A   Occupational History  . 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 Smoking status: Former Smoker    Packs/day: 0.50    Years: 7.00    Types: Cigarettes    Quit date: 12/13/1986  . Smokeless tobacco: Never Used  . Alcohol use No  . Drug use: No  . Sexual activity: Not Asked     Comment: married   Other Topics Concern  . 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    Outpatient Medications Prior to Visit  Medication Sig Dispense Refill  . Cyanocobalamin (B-12) 1000 MCG SUBL Place under the tongue.    Marland Kitchen DILT-XR 240 MG 24 hr capsule TAKE 1 CAPSULE BY MOUTH DAILY. NEEDS APPOINTMENT FOR FUTURE REFILLS. 90 capsule 1  . fluconazole (DIFLUCAN) 150 MG tablet Take one tablet and may repeat once in 3 days as needed 2 tablet 0  . fluticasone (FLONASE) 50 MCG/ACT nasal spray 2 sprays per nostril daily.  MUST HAVE OFFICE VISIT FOR FURTHER REFILLS. 16 g 0  . Lactobacillus (ACIDOPHILUS) 10 MG CAPS Take by mouth daily.     Marland Kitchen levonorgestrel (MIRENA) 20 MCG/24HR IUD 1 each by Intrauterine route once.      Marland Kitchen losartan (COZAAR) 50 MG tablet Take 1 tablet (50 mg total) by mouth daily. 90 tablet 1  . nystatin ointment (MYCOSTATIN) Apply topically as directed 30 g 1  . RESTASIS 0.05 % ophthalmic emulsion     .  Zinc Oxide 30.6 % CREA Apply topically as needed.     . metFORMIN (GLUCOPHAGE) 500 MG tablet Take 1 tablet (500 mg total) by mouth daily with breakfast. 90 tablet 3  . metFORMIN (GLUCOPHAGE) 500 MG tablet TAKE 1 TABLET BY MOUTH DAILY WITH BREAKFAST 90 tablet 1   No facility-administered medications prior to visit.      EXAM:  BP 132/88 (BP Location: Right Arm, Patient Position: Sitting, Cuff Size: Normal)   Temp 98.2 F (36.8 C) (Oral)   Wt 187 lb (84.8 kg)   BMI 37.14 kg/m   Body mass index is 37.14 kg/m.  GENERAL: vitals reviewed and listed above, alert, oriented, appears well hydrated and in no acute distress HEENT: atraumatic, conjunctiva  clear, no obvious abnormalities on inspection of external nose and ears   tms  Intact   w light reflex  Some wax in canal soft OP : no lesion edema or exudate  NECK: no obvious masses on inspection palpation  PSYCH: pleasant and cooperative, no obvious depression or anxiety Lab Results  Component Value Date   WBC 7.2  07/23/2016   HGB 14.3 07/23/2016   HCT 42.4 07/23/2016   PLT 210.0 07/23/2016   GLUCOSE 98 07/23/2016   CHOL 217 (H) 07/23/2016   TRIG 77.0 07/23/2016   HDL 54.20 07/23/2016   LDLDIRECT 160.7 12/10/2010   LDLCALC 147 (H) 07/23/2016   ALT 15 07/23/2016   AST 14 07/23/2016   NA 138 07/23/2016   K 4.0 07/23/2016   CL 106 07/23/2016   CREATININE 0.77 07/23/2016   BUN 17 07/23/2016   CO2 25 07/23/2016   TSH 2.34 07/23/2016   HGBA1C 5.9 02/23/2017   BP Readings from Last 3 Encounters:  03/02/17 132/88  01/03/17 (!) 136/96  11/01/16 138/80   Wt Readings from Last 3 Encounters:  03/02/17 187 lb (84.8 kg)  01/03/17 185 lb 3.2 oz (84 kg)  11/01/16 183 lb 6.4 oz (83.2 kg)   Lab work reviewed  ASSESSMENT AND PLAN:  Discussed the following assessment and plan:  Hyperglycemia - good a1c ry inc dose metformin as tolerated   PCOS (polycystic ovarian syndrome)  Medication management  Low normal vitamin B12 level now nl  Headache, unspecified headache type Discussed option of increasing her metformin and at least changed to extended release and see tolerability. She is willing to try will try taking 2 a day. V56 is certainly adequate. Continue undergoing sleep airway evaluation. Suspect headache may be part sinus related and parts dressed. Try increasing her Flonase to twice a day. Let us know how the metformin as tolerated we can adjust doses. Otherwise preventive visit with full set of labs and hemoglobin A1c in about 6 months. revewied   Some data or dm prevention  Husband has DM and had se of meds  -Patient advised to return or notify health care team  if  new concerns arise.  Patient Instructions  Try increasing  To 2 500 mg per day     Apart or together   Let us know if not tolerated      Studies to prevent diabetes used   Higher doses   Of meds .     Continue life style intervention.    Your a1c is better also .   Let us know how you are doing or if not tolerated .   My chart or call.  Or  6 months  cpx with labs and hg a1c  Standley Brooking. Panosh M.D.

## 2017-03-02 NOTE — Patient Instructions (Addendum)
Try increasing  To 2 500 mg per day     Apart or together   Let us know if not tolerated      Studies to prevent diabetes used   Higher doses   Of meds .     Continue life style intervention.    Your a1c is better also .   Let us know how you are doing or if not tolerated .  My chart or call.  Or  6 months  cpx with labs and hg a1c

## 2017-03-03 NOTE — Telephone Encounter (Signed)
Spoke with pt about AD's email he sent me. Pt has a follow up appointment on April 2, she is aware to keep that appointment. Nothing further is needed at this time.   [place], MD  Joellen Jersey; [place], [place]        [place] >> Patient already had a home sleep study which was (-). I wanted to get a sleep lab study to R/O OSA altogether. I guess insurance denied lab study. I will need to see pt and decide if lab study is really needed.   Love [place]own[place] >> can we schedule a f/u with me ?   Thanks.   Monica Becton, MD  03/03/2017, 12:27 PM  [facility]  Pager [phone]  After 3 pm or if no answer, call [phone]

## 2017-03-14 ENCOUNTER — Ambulatory Visit (INDEPENDENT_AMBULATORY_CARE_PROVIDER_SITE_OTHER): Payer: BLUE CROSS/BLUE SHIELD | Admitting: Pulmonary Disease

## 2017-03-14 ENCOUNTER — Telehealth: Payer: Self-pay | Admitting: Pulmonary Disease

## 2017-03-14 ENCOUNTER — Encounter: Payer: Self-pay | Admitting: Pulmonary Disease

## 2017-03-14 DIAGNOSIS — G4710 Hypersomnia, unspecified: Secondary | ICD-10-CM

## 2017-03-14 NOTE — Telephone Encounter (Signed)
   Hello team.   I saw this patient for hypersomnia consult, R/O OSA. We did a HST in 01/2017 and it was (-) for OSA. Patient however remains very symptomatic for her hypersomnia.  Very tired and sleepy.   I requested a LAB SLEEP study but her insurance denied this.   Can we appeal this?  She really needs a LAB SLEEP study to definitely R/O OSA.  Patient has a LAB SLEEP study scheduled for 03/27/17.  I told her to keep this as we try to let her insurance approve the study.   Who do we need to speak to?   Thanks for your help!  J. Shirl Harris, MD 03/15/2017, 12:03 AM [facility] Pager [phone] After 3 pm or if no answer, call [phone]

## 2017-03-14 NOTE — Progress Notes (Signed)
Subjective:    Patient ID: Gail [name], female    DOB: 1966/04/23, 51 y.o.   MRN: [id number]  HPI   This is the case of Gail [name], 51 y.o. Female, who was referred by Dr. Shanon Ace  in consultation regarding possible OSA.   As you very well know, patient has snoring, gasping, choking, has tiredness all day. Non smoker, no asthma or copd.  She has hypersomnia affecting her fxnality. She has HTN since 52 yo. She sleeps 7 hrs/night.  Wakes up unrefreshed in am.  Naps daily. Very symptomatic.   ESS 17.   She is very symptomatic. She is a Aeronautical engineer. Gets sleepy at work.   (-) abnormal behavior in sleep.    ROV 03/14/2017 Patient returns to office as f/u on her hypersomnia.  She had a HST in 01/2017 which showed AHI of 3.  She did not sleep well. She was uncomfortable with the probes. Sleeps 6-7 hrs/night.  Wakes up unrefreshed in am.  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 Very symptomatic. (+) sleep talk. Naps on the weekends. Denies opiate or benzo intake.  We ordered a lab sleep study but insurance denied this test.     Review of Systems  Constitutional: Negative.  Negative for fever and unexpected weight change.  HENT: Negative for congestion, dental problem, ear pain, nosebleeds, postnasal drip, rhinorrhea, sinus pressure, sneezing, sore throat and trouble swallowing.   Eyes: Negative.  Negative for redness and itching.  Respiratory: Positive for shortness of breath. Negative for cough, chest tightness and wheezing.   Cardiovascular: Negative.  Negative for palpitations and leg swelling.  Gastrointestinal: Negative.  Negative for nausea and vomiting.  Endocrine: Negative.   Genitourinary: Negative.  Negative for dysuria.  Musculoskeletal: Negative.  Negative for joint swelling.  Skin: Negative.  Negative for rash.  Allergic/Immunologic: Positive for environmental allergies.  Neurological: Negative.  Negative for headaches.    Hematological: Does not bruise/bleed easily.  Psychiatric/Behavioral: Negative.  Negative for dysphoric mood. The patient is not nervous/anxious.       Objective:   Physical Exam   Vitals:  Vitals:   03/14/17 1554  BP: 122/76  Pulse: 99  SpO2: 97%  Weight: 184 lb 12.8 oz (83.8 kg)  Height: 4' 11.5" (1.511 m)    Constitutional/General:  Pleasant, well-nourished, well-developed, not in any distress,  Comfortably seating.  Well kempt  Body mass index is 36.7 kg/m. Wt Readings from Last 3 Encounters:  03/14/17 184 lb 12.8 oz (83.8 kg)  03/02/17 187 lb (84.8 kg)  01/03/17 185 lb 3.2 oz (84 kg)     HEENT: Pupils equal and reactive to light and accommodation. Anicteric sclerae. Normal nasal mucosa.   No oral  lesions,  mouth clear,  oropharynx clear, no postnasal drip. (-) Oral thrush. No dental caries.  Airway - Mallampati class IV  Neck: No masses. Midline trachea. No JVD, (-) LAD. (-) bruits appreciated.  Respiratory/Chest: Grossly normal chest. (-) deformity. (-) Accessory muscle use.  Symmetric expansion. (-) Tenderness on palpation.  Resonant on percussion.  Diminished BS on both lower lung zones. (-) wheezing, crackles, rhonchi (-) egophony  Cardiovascular: Regular rate and  rhythm, heart sounds normal, no murmur or gallops, no peripheral edema  Gastrointestinal:  Normal bowel sounds. Soft, non-tender. No hepatosplenomegaly.  (-) masses.   Musculoskeletal:  Normal muscle tone. Normal gait.   Extremities: Grossly normal. (-) clubbing, cyanosis.  (-) edema  Skin: (-) rash,lesions seen.   Neurological/Psychiatric : alert, oriented to time, place, person. Normal mood and affect         Assessment & Plan:  Hypersomnia patient has snoring, gasping, choking, has tiredness all day. Non smoker, no asthma or copd.  She has hypersomnia affecting her fxnality. She has HTN since 51 yo. She sleeps 7 hrs/night.  Wakes up unrefreshed in am.  Naps daily. Very  symptomatic.   ESS 17.   She is very symptomatic. She is a Aeronautical engineer. Gets sleepy at work.   (-) abnormal behavior in sleep.   Pt had a HST in 01/2017 which showed AHI of 3.  She did not sleep well. She was uncomfortable with the probes. Sleeps 6-7 hrs/night.  Wakes up unrefreshed in am.  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 Remains very symptomatic. (+) sleep talk. Naps on the weekends. Denies opiate or benzo intake.  We ordered a lab sleep study but insurance denied this test.     Plan :  We discussed about the diagnosis of Obstructive Sleep Apnea (OSA) and implications of untreated OSA. We discussed about CPAP and BiPaP as possible treatment options.    I was told her lab sleep study was DENIED by her medical insurance.  Pt remains very symptomatic.  She gets really tired and sleepy during afternoon.  She is a Research scientist (medical).  We will try to investigate and appeal her lab sleep study.  She has scheduled the sleep study to be done on 03/27/17.  I told her, before lab study is done, for her to verify with her insurance to make sure it is covered.  If the sleep study is done and is (-), will need to investigate further the hypersomnia.  She does not appear depressed or anxious.  She has obesity and PCOS.     Patient was instructed to call the office if he/she has not heard back from the office 1-2 weeks after the sleep study.      Follow up in the office in May.  Sooner if sleep study is done and is (-).   Monica Becton, MD 03/14/2017   8:29 PM [facility] [place] Pager: [phone] Office: [phone], Fax: [phone]

## 2017-03-14 NOTE — Assessment & Plan Note (Signed)
patient has snoring, gasping, choking, has tiredness all day. Non smoker, no asthma or copd.  She has hypersomnia affecting her fxnality. She has HTN since 51 yo. She sleeps 7 hrs/night.  Wakes up unrefreshed in am.  Naps daily. Very symptomatic.   ESS 17.   She is very symptomatic. She is a Aeronautical engineer. Gets sleepy at work.   (-) abnormal behavior in sleep.   Pt had a HST in 01/2017 which showed AHI of 3.  She did not sleep well. She was uncomfortable with the probes. Sleeps 6-7 hrs/night.  Wakes up unrefreshed in am.  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 Remains very symptomatic. (+) sleep talk. Naps on the weekends. Denies opiate or benzo intake.  We ordered a lab sleep study but insurance denied this test.     Plan :  We discussed about the diagnosis of Obstructive Sleep Apnea (OSA) and implications of untreated OSA. We discussed about CPAP and BiPaP as possible treatment options.    I was told her lab sleep study was DENIED by her medical insurance.  Pt remains very symptomatic.  She gets really tired and sleepy during afternoon.  She is a Research scientist (medical).  We will try to investigate and appeal her lab sleep study.  She has scheduled the sleep study to be done on 03/27/17.  I told her, before lab study is done, for her to verify with her insurance to make sure it is covered.  If the sleep study is done and is (-), will need to investigate further the hypersomnia.  She does not appear depressed or anxious.  She has obesity and PCOS.     Patient was instructed to call the office if he/she has not heard back from the office 1-2 weeks after the sleep study.

## 2017-03-14 NOTE — Patient Instructions (Signed)
It was a pleasure taking care of you today!  We will work on having your lab study approved by your insurance. We will keep you posted by the end of the week. If no one calls to by the end of the week, please call us back. In the meantime, keep your April 15th  lab sleep study.  Return to clinic end 3rd or 4th week of May with Dr. Corrie Dandy

## 2017-03-15 NOTE — Telephone Encounter (Signed)
Spoke to the appeal [email or web address]  , waiting on them to let me know the appeals process Joellen Jersey

## 2017-03-17 NOTE — Telephone Encounter (Signed)
Left another message with [employer] appeals dept Joellen Jersey

## 2017-03-18 NOTE — Telephone Encounter (Signed)
Spoke to [email or web address]  appeals -Peter Congo I an faxing records to the appeals dept and the case will be reopened for review Joellen Jersey

## 2017-03-21 NOTE — Telephone Encounter (Signed)
I received a call back today from [employer] appeals ans this in lab study still has been denied Gail [name]

## 2017-03-23 NOTE — Telephone Encounter (Signed)
Patient called back regarding sleep study - wanting to know what to do - she would like a call back between 1:30 and 2:30pm at [phone] -pr

## 2017-03-23 NOTE — Telephone Encounter (Signed)
Received a call from pt's insurance and the appeal was denied also for the sleep study pt is aware she wants to know if dr de dios thinks she should go ahead and try cpap Gail [name]

## 2017-03-24 ENCOUNTER — Encounter: Payer: Self-pay | Admitting: Internal Medicine

## 2017-03-27 ENCOUNTER — Encounter (HOSPITAL_BASED_OUTPATIENT_CLINIC_OR_DEPARTMENT_OTHER): Payer: BLUE CROSS/BLUE SHIELD

## 2017-03-28 DIAGNOSIS — L290 Pruritus ani: Secondary | ICD-10-CM | POA: Diagnosis not present

## 2017-03-28 DIAGNOSIS — Z1389 Encounter for screening for other disorder: Secondary | ICD-10-CM | POA: Diagnosis not present

## 2017-03-28 DIAGNOSIS — Z130 Encounter for screening for diseases of the blood and blood-forming organs and certain disorders involving the immune mechanism: Secondary | ICD-10-CM | POA: Diagnosis not present

## 2017-03-28 DIAGNOSIS — N898 Other specified noninflammatory disorders of vagina: Secondary | ICD-10-CM | POA: Diagnosis not present

## 2017-03-28 DIAGNOSIS — Z6835 Body mass index (BMI) 35.0-35.9, adult: Secondary | ICD-10-CM | POA: Diagnosis not present

## 2017-03-28 DIAGNOSIS — Z01419 Encounter for gynecological examination (general) (routine) without abnormal findings: Secondary | ICD-10-CM | POA: Diagnosis not present

## 2017-03-28 DIAGNOSIS — Z30431 Encounter for routine checking of intrauterine contraceptive device: Secondary | ICD-10-CM | POA: Diagnosis not present

## 2017-03-29 ENCOUNTER — Ambulatory Visit (INDEPENDENT_AMBULATORY_CARE_PROVIDER_SITE_OTHER): Payer: BLUE CROSS/BLUE SHIELD | Admitting: Family Medicine

## 2017-03-29 ENCOUNTER — Telehealth: Payer: Self-pay | Admitting: Pulmonary Disease

## 2017-03-29 VITALS — BP 120/70 | HR 100 | Temp 98.7°F | Wt 183.1 lb

## 2017-03-29 DIAGNOSIS — J069 Acute upper respiratory infection, unspecified: Secondary | ICD-10-CM

## 2017-03-29 DIAGNOSIS — R509 Fever, unspecified: Secondary | ICD-10-CM

## 2017-03-29 DIAGNOSIS — B9789 Other viral agents as the cause of diseases classified elsewhere: Secondary | ICD-10-CM | POA: Diagnosis not present

## 2017-03-29 DIAGNOSIS — R05 Cough: Secondary | ICD-10-CM | POA: Diagnosis not present

## 2017-03-29 DIAGNOSIS — R059 Cough, unspecified: Secondary | ICD-10-CM

## 2017-03-29 LAB — POCT INFLUENZA A/B
INFLUENZA A, POC: NEGATIVE
INFLUENZA B, POC: NEGATIVE

## 2017-03-29 MED ORDER — HYDROCODONE-HOMATROPINE 5-1.5 MG/5ML PO SYRP: 5.0000 mL | ORAL_SOLUTION | Freq: Four times a day (QID) | ORAL | 0 refills | Status: AC | PRN

## 2017-03-29 NOTE — Telephone Encounter (Signed)
lmomtcb x1 

## 2017-03-29 NOTE — Progress Notes (Signed)
Pre visit review using our clinic review tool, if applicable. No additional management support is needed unless otherwise documented below in the visit note. 

## 2017-03-29 NOTE — Telephone Encounter (Signed)
   Gail [name] :  Pls call pt and tell her insurance denied the sleep study.  I need to call insurance on Thursday or Friday when I am at the office to ask them to approve her sleep study. Thanks!  pls tell her I have not been at the office so I have not called up her insurance to appeal.   Thanks.    Monica Becton, MD 03/29/2017, 12:29 AM [facility] Pager [phone] After 3 pm or if no answer, call [phone]

## 2017-03-29 NOTE — Patient Instructions (Signed)
Upper Respiratory Infection, Adult Most upper respiratory infections (URIs) are a viral infection of the air passages leading to the lungs. A URI affects the nose, throat, and upper air passages. The most common type of URI is nasopharyngitis and is typically referred to as "the common cold." URIs run their course and usually go away on their own. Most of the time, a URI does not require medical attention, but sometimes a bacterial infection in the upper airways can follow a viral infection. This is called a secondary infection. Sinus and middle ear infections are common types of secondary upper respiratory infections. Bacterial pneumonia can also complicate a URI. A URI can worsen asthma and chronic obstructive pulmonary disease (COPD). Sometimes, these complications can require emergency medical care and may be life threatening. What are the causes? Almost all URIs are caused by viruses. A virus is a type of germ and can spread from one person to another. What increases the risk? You may be at risk for a URI if:  You smoke.  You have chronic heart or lung disease.  You have a weakened defense (immune) system.  You are very young or very old.  You have nasal allergies or asthma.  You work in crowded or poorly ventilated areas.  You work in health care facilities or schools.  What are the signs or symptoms? Symptoms typically develop 2-3 days after you come in contact with a cold virus. Most viral URIs last 7-10 days. However, viral URIs from the influenza virus (flu virus) can last 14-18 days and are typically more severe. Symptoms may include:  Runny or stuffy (congested) nose.  Sneezing.  Cough.  Sore throat.  Headache.  Fatigue.  Fever.  Loss of appetite.  Pain in your forehead, behind your eyes, and over your cheekbones (sinus pain).  Muscle aches.  How is this diagnosed? Your health care provider may diagnose a URI by:  Physical exam.  Tests to check that your  symptoms are not due to another condition such as: ? Strep throat. ? Sinusitis. ? Pneumonia. ? Asthma.  How is this treated? A URI goes away on its own with time. It cannot be cured with medicines, but medicines may be prescribed or recommended to relieve symptoms. Medicines may help:  Reduce your fever.  Reduce your cough.  Relieve nasal congestion.  Follow these instructions at home:  Take medicines only as directed by your health care provider.  Gargle warm saltwater or take cough drops to comfort your throat as directed by your health care provider.  Use a warm mist humidifier or inhale steam from a shower to increase air moisture. This may make it easier to breathe.  Drink enough fluid to keep your urine clear or pale yellow.  Eat soups and other clear broths and maintain good nutrition.  Rest as needed.  Return to work when your temperature has returned to normal or as your health care provider advises. You may need to stay home longer to avoid infecting others. You can also use a face mask and careful hand washing to prevent spread of the virus.  Increase the usage of your inhaler if you have asthma.  Do not use any tobacco products, including cigarettes, chewing tobacco, or electronic cigarettes. If you need help quitting, ask your health care provider. How is this prevented? The best way to protect yourself from getting a cold is to practice good hygiene.  Avoid oral or hand contact with people with cold symptoms.  Wash your   hands often if contact occurs.  There is no clear evidence that vitamin C, vitamin E, echinacea, or exercise reduces the chance of developing a cold. However, it is always recommended to get plenty of rest, exercise, and practice good nutrition. Contact a health care provider if:  You are getting worse rather than better.  Your symptoms are not controlled by medicine.  You have chills.  You have worsening shortness of breath.  You have  brown or red mucus.  You have yellow or brown nasal discharge.  You have pain in your face, especially when you bend forward.  You have a fever.  You have swollen neck glands.  You have pain while swallowing.  You have white areas in the back of your throat. Get help right away if:  You have severe or persistent: ? Headache. ? Ear pain. ? Sinus pain. ? Chest pain.  You have chronic lung disease and any of the following: ? Wheezing. ? Prolonged cough. ? Coughing up blood. ? A change in your usual mucus.  You have a stiff neck.  You have changes in your: ? Vision. ? Hearing. ? Thinking. ? Mood. This information is not intended to replace advice given to you by your health care provider. Make sure you discuss any questions you have with your health care provider. Document Released: 05/25/2001 Document Revised: 08/01/2016 Document Reviewed: 03/06/2014 Elsevier Interactive Patient Education  2017 [employer]  

## 2017-03-29 NOTE — Progress Notes (Signed)
Subjective:     Patient ID: Gail [name], female   DOB: [birth year], 51 y.o.   MRN: [id number]  HPI Patient seen with onset 2 days ago of cough, sore throat, nasal congestion. She had fever up to 101.9 last night. 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 She thinks she may have some mild wheezing. She has some body aches. Denies any nausea, vomiting, or diarrhea. Took some Advil this morning which lowered her fever  Cough severe at times. Not relieved with over-the-counter medication  Past Medical History:  Diagnosis Date  . Allergy    History of shots as a child  . Anal fissure   . Arthritis   . History of infertility, female   . Hyperlipidemia   . Hypertension   . IBS (irritable bowel syndrome)   . Microscopic hematuria 2017   urology evaluated no sig cause    . PCO (polycystic ovaries)   . Rosacea    Past Surgical History:  Procedure Laterality Date  . CARPAL TUNNEL RELEASE     x2 bilateral  . KNEE ARTHROSCOPY     left   . WISDOM TOOTH EXTRACTION    . wrist tendon release     bilateral thumbs    reports that she quit smoking about 30 years ago. Her smoking use included Cigarettes. She has a 3.50 pack-year smoking history. She has never used smokeless tobacco. She reports that she does not drink alcohol or use drugs. family history includes ALS in her father; Dementia in her maternal grandfather and paternal grandfather; Heart disease in her mother and paternal grandmother; Hypertension in her father and mother. Allergies  Allergen Reactions  . Ace Inhibitors Cough  . Codeine Itching  . Erythromycin Ethylsuccinate     Stomach hurts REACTION: unspecified   can take azithromycin  . Doxycycline Rash  . Latex Rash     Review of Systems  Constitutional: Positive for chills, fatigue and fever.  HENT: Positive for congestion and sore throat.   Respiratory: Positive for cough.   Cardiovascular: Negative  for chest pain.  Gastrointestinal: Negative for nausea and vomiting.       Objective:   Physical Exam  Constitutional: She appears well-developed and well-nourished.  HENT:  Right Ear: External ear normal.  Left Ear: External ear normal.  Mouth/Throat: Oropharynx is clear and moist. No oropharyngeal exudate.  Neck: Neck supple.  Cardiovascular: Normal rate and regular rhythm.   Pulmonary/Chest: Effort normal and breath sounds normal. No respiratory distress. She has no wheezes. She has no rales.  Lymphadenopathy:    She has no cervical adenopathy.       Assessment:     Probable viral URI with cough.  Patient in no respiratory distress and currently afebrile    Plan:     -Check influenza screen. If negative, treat symptomatically -Hycodan cough syrup 1 teaspoon daily at bedtime when necessary severe cough  Eulas Post MD [facility] at [facility]

## 2017-03-29 NOTE — Telephone Encounter (Signed)
Closing this message as AD has sent me a separate message in regards to this.

## 2017-03-30 ENCOUNTER — Telehealth: Payer: Self-pay | Admitting: Internal Medicine

## 2017-03-30 DIAGNOSIS — R05 Cough: Secondary | ICD-10-CM

## 2017-03-30 DIAGNOSIS — R059 Cough, unspecified: Secondary | ICD-10-CM

## 2017-03-30 NOTE — Telephone Encounter (Signed)
° ° ° ° °  Pt said she is not feeling any better, still cant talk, light fever . Would like a call back

## 2017-03-30 NOTE — Telephone Encounter (Signed)
Patient complaining of sore throat with pain when swallowing and a cough.  Patient has been taking Advil and woke up this morning with a temp of 101.  Please advise  [facility] [place]

## 2017-03-30 NOTE — Telephone Encounter (Signed)
If cough and fever persist today, go ahead with CXR.  Suspect this is still viral.

## 2017-03-30 NOTE — Telephone Encounter (Signed)
Sent pt a MyChart message due to our phone lines being down two days in a row

## 2017-03-31 ENCOUNTER — Encounter: Payer: Self-pay | Admitting: Family Medicine

## 2017-03-31 ENCOUNTER — Ambulatory Visit (INDEPENDENT_AMBULATORY_CARE_PROVIDER_SITE_OTHER)
Admission: RE | Admit: 2017-03-31 | Discharge: 2017-03-31 | Disposition: A | Discharge status: Home / Self Care | Payer: BLUE CROSS/BLUE SHIELD | Source: Ambulatory Visit | Attending: Family Medicine | Admitting: Family Medicine

## 2017-03-31 DIAGNOSIS — R05 Cough: Secondary | ICD-10-CM | POA: Diagnosis not present

## 2017-03-31 DIAGNOSIS — R059 Cough, unspecified: Secondary | ICD-10-CM

## 2017-03-31 NOTE — Telephone Encounter (Signed)
AD  Gail [name] is aware that you will contact her insurance. She said she is also ok if you decided to just place order for cpap.

## 2017-03-31 NOTE — Telephone Encounter (Signed)
I left a message for the pt to return my call. 

## 2017-03-31 NOTE — Telephone Encounter (Signed)
Patient called back and stated she still has a cough.  She is aware to go the [place] office for a chest x-ray as advised by Dr Elease Hashimoto.

## 2017-03-31 NOTE — Telephone Encounter (Signed)
See results note. 

## 2017-04-01 ENCOUNTER — Telehealth: Payer: Self-pay | Admitting: Pulmonary Disease

## 2017-04-01 ENCOUNTER — Encounter: Payer: Self-pay | Admitting: Family Medicine

## 2017-04-01 ENCOUNTER — Other Ambulatory Visit: Payer: Self-pay | Admitting: Family Medicine

## 2017-04-01 DIAGNOSIS — G4710 Hypersomnia, unspecified: Secondary | ICD-10-CM

## 2017-04-01 MED ORDER — ALBUTEROL SULFATE 108 (90 BASE) MCG/ACT IN AEPB: 2.0000 | INHALATION_SPRAY | RESPIRATORY_TRACT | 0 refills | Status: DC | PRN

## 2017-04-01 NOTE — Telephone Encounter (Signed)
Spoke with pt and she is aware that she will need a repeat HST and if that is negative then she will have a lab study. [facility] verified  that she had the approval. The order for the HST was placed.

## 2017-04-01 NOTE — Telephone Encounter (Signed)
Pt would like to know if she could get an inhaler?  Something to help during the day. Pt not better. Can hear wheezing at night.   Also Pt states she could not go back today due to coughing and not much better. 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  Would like a note putting her out an 2 extra days, Thursday and today.  [facility], [place] - [address] [place] [address] [phone] (Phone) [phone] (Fax)

## 2017-04-01 NOTE — Telephone Encounter (Signed)
  Hello team !  I called up pt's insurance today and had her HST approved.   We can do this HST first and if is (-), we can order the lab sleep study.  They denied it the first time since allegedly, we requested for a "CPAP titration study" and NOT a diagnostic sleep lab study. The submitted CPT code was wrong I was told.   Anyway, they will call us Monday to give the approval number so we can proceed  with the HST next week.   Jasmine :  Can we call pt and update her pls?   Judeen Hammans / Libby : Insurance will call next week. Order has been placed already when I called up the insurance. They will give [place] the approval number next week. Once we have it, we can go ahead and perform  a home sleep study.  Thanks for your help!  J. Shirl Harris, MD 04/01/2017, 1:02 PM [facility] Pager [phone] After 3 pm or if no answer, call [phone]

## 2017-04-04 NOTE — Telephone Encounter (Signed)
Dr dedios has approved this through her ins [place]

## 2017-04-27 DIAGNOSIS — G4733 Obstructive sleep apnea (adult) (pediatric): Secondary | ICD-10-CM

## 2017-04-29 ENCOUNTER — Telehealth: Payer: Self-pay | Admitting: Pulmonary Disease

## 2017-04-29 DIAGNOSIS — M5136 Other intervertebral disc degeneration, lumbar region: Secondary | ICD-10-CM | POA: Diagnosis not present

## 2017-04-29 DIAGNOSIS — G4733 Obstructive sleep apnea (adult) (pediatric): Secondary | ICD-10-CM | POA: Diagnosis not present

## 2017-04-29 NOTE — Telephone Encounter (Signed)
    Please call the pt and tell the pt the [place]  showed OSA   Pt stops breathing 11   times an hour.   Home sleep study was done on : 04/27/17  Please order autoCPAP 5-15 cm H2O. Patient will need a mask fitting session. Patient will need a 1 month download.   Patient needs to be seen by me or any of the NPs/APPs  4-6 weeks after obtaining the cpap machine. Let me know if you receive this.   Thanks!   J. Shirl Harris, MD 04/29/2017, 1:44 PM

## 2017-05-02 ENCOUNTER — Other Ambulatory Visit: Payer: Self-pay | Admitting: *Deleted

## 2017-05-02 DIAGNOSIS — G4710 Hypersomnia, unspecified: Secondary | ICD-10-CM

## 2017-05-03 NOTE — Telephone Encounter (Signed)
Spoke with pt, she has an appt tomorrow with AD that she wants to keep and discuss this him further. Nothing further is needed at this time.

## 2017-05-04 ENCOUNTER — Ambulatory Visit (INDEPENDENT_AMBULATORY_CARE_PROVIDER_SITE_OTHER): Payer: BLUE CROSS/BLUE SHIELD | Admitting: Pulmonary Disease

## 2017-05-04 ENCOUNTER — Encounter: Payer: Self-pay | Admitting: Pulmonary Disease

## 2017-05-04 DIAGNOSIS — G4733 Obstructive sleep apnea (adult) (pediatric): Secondary | ICD-10-CM | POA: Diagnosis not present

## 2017-05-04 NOTE — Progress Notes (Signed)
Subjective:    Patient ID: Gail [name], female    DOB: 03-Sep-1966, 51 y.o.   MRN: [id number]  HPI   This is the case of Gail [name], 51 y.o. Female, who was referred by Dr. Shanon Ace  in consultation regarding possible OSA.   As you very well know, patient has snoring, gasping, choking, has tiredness all day. Non smoker, no asthma or copd.  She has hypersomnia affecting her fxnality. She has HTN since 51 yo. She sleeps 7 hrs/night.  Wakes up unrefreshed in am.  Naps daily. Very symptomatic.   ESS 17.   She is very symptomatic. She is a Aeronautical engineer. Gets sleepy at work.   (-) abnormal behavior in sleep.    ROV 03/14/2017 Patient returns to office as f/u on her hypersomnia.  She had a HST in 01/2017 which showed AHI of 3.  She did not sleep well. She was uncomfortable with the probes. Sleeps 6-7 hrs/night.  Wakes up unrefreshed in am.  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 Very symptomatic. (+) sleep talk. Naps on the weekends. Denies opiate or benzo intake.  We ordered a lab sleep study but insurance denied this test.   ROV 05/04/2017 Patient returns to the office as follow-up on her sleepiness. Since last seen, she had a home sleep study done in May which showed an AHI of 11. She remains symptomatic. Has hypersomnia is affecting her functionality.  v   Review of Systems  Constitutional: Negative.  Negative for fever and unexpected weight change.  HENT: Negative for congestion, dental problem, ear pain, nosebleeds, postnasal drip, rhinorrhea, sinus pressure, sneezing, sore throat and trouble swallowing.   Eyes: Negative.  Negative for redness and itching.  Respiratory: Positive for shortness of breath. Negative for cough, chest tightness and wheezing.   Cardiovascular: Negative.  Negative for palpitations and leg swelling.  Gastrointestinal: Negative.  Negative for nausea and vomiting.  Endocrine: Negative.   Genitourinary: Negative.  Negative for dysuria.  Musculoskeletal: Negative.  Negative for joint swelling.  Skin: Negative.  Negative for rash.  Allergic/Immunologic: Positive for environmental allergies.  Neurological: Negative.  Negative for headaches.  Hematological: Does not bruise/bleed easily.  Psychiatric/Behavioral: Negative.  Negative for dysphoric mood. The patient is not nervous/anxious.       Objective:   Physical Exam   Vitals:  Vitals:   05/04/17 1554  BP: 124/78  Pulse: 99  SpO2: 97%  Weight: 182 lb 9.6 oz (82.8 kg)  Height: 4' 11.5" (1.511 m)    Constitutional/General:  Pleasant, well-nourished, well-developed, not in any distress,  Comfortably seating.  Well kempt  Body mass index is 36.26 kg/m. Wt Readings from Last 3 Encounters:  05/04/17 182 lb 9.6 oz (82.8 kg)  03/29/17 183 lb 1.6 oz (83.1 kg)  03/14/17 184 lb 12.8 oz (83.8 kg)     HEENT: Pupils  equal and reactive to light and accommodation. Anicteric sclerae. Normal nasal mucosa.   No oral  lesions,  mouth clear,  oropharynx clear, no postnasal drip. (-) Oral thrush. No dental caries.  Airway - Mallampati class IV  Neck: No masses. Midline trachea. No JVD, (-) LAD. (-) bruits appreciated.  Respiratory/Chest: Grossly normal chest. (-) deformity. (-) Accessory muscle use.  Symmetric expansion. (-) Tenderness on palpation.  Resonant on percussion.  Diminished BS on both lower lung zones. (-) wheezing, crackles, rhonchi (-) egophony  Cardiovascular: Regular rate and  rhythm, heart sounds normal, no murmur or gallops, no peripheral edema  Gastrointestinal:  Normal bowel sounds. Soft, non-tender. No hepatosplenomegaly.  (-) masses.   Musculoskeletal:  Normal muscle tone. Normal gait.   Extremities: Grossly normal. (-) clubbing, cyanosis.  (-) edema        Skin: (-) rash,lesions seen.   Neurological/Psychiatric : alert, oriented to time, place, person. Normal mood and affect         Assessment & Plan:  OSA (obstructive sleep apnea) patient has snoring, gasping, choking, has tiredness all day. Non smoker, no asthma or copd.  She has hypersomnia affecting her fxnality. She has HTN since 51 yo. She sleeps 7 hrs/night.  Wakes up unrefreshed in am.  Naps daily. Very symptomatic.   ESS 17.   She is very symptomatic. She is a Aeronautical engineer. Gets sleepy at work.   (-) abnormal behavior in sleep.   Pt had a HST in 01/2017 which showed AHI of 3.  She did not sleep well. She was uncomfortable with the probes. Sleeps 6-7 hrs/night.  Wakes up unrefreshed in am.  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 Remains very symptomatic. (+) sleep talk. Naps on the weekends. Denies opiate or benzo intake.  We ordered a lab sleep study but insurance denied this test.   I was eventually able to let insurance pay for a repeat HST.   She had a repeat home sleep  study in May 2018 which showed an AHI  of 11. She slept better during the second home sleep study. She remains very symptomatic.    Plan :  We extensively discussed the diagnosis, pathophysiology, and treatment options for Obstructive Sleep Apnea (OSA).   We discussed treatment options for OSA including CPAP, BiPaP, as well as surgical options and oral devices.   We will start patient on autocpap 5-15 cm water. She will need a mask fit. Hopefully her symptoms will improve with CPAP therapy. She does not strike me as someone with depression causing the hypersomnia. She has anxiety however.   Patient was instructed to call the office if he/she has not received the PAP device in 1-2 weeks.  Patient was instructed to have mask, tubings, filter, reservoir cleaned at least once a week with soapy water.  Patient was instructed to call the office if he/she is having issues with the PAP device.    I advised patient to obtain sufficient amount of sleep --  7 to 8 hours at least in a 24 hr period.  Patient was advised to follow good sleep hygiene.  Patient was advised NOT to engage in activities requiring concentration and/or vigilance if he/she is and  sleepy.  Patient is NOT to drive if he/she is sleepy.       Follow up in 6-8 weeks with cpap.   Monica Becton, MD 05/04/2017   4:29 PM [facility] [place] Pager: [phone] Office: [phone], Fax: [phone]

## 2017-05-04 NOTE — Patient Instructions (Signed)
  It was a pleasure taking care of you today!  You are diagnosed with Obstructive Sleep Apnea or OSA.  You stop breathing  11   times an hour.   We will order you an autoCPAP  machine.  Please call the office if you do NOT receive your machine in the next 1-2 weeks.   Please make sure you use your CPAP device everytime you sleep.  We will monitor the usage of your machine per your insurance requirement.  Your insurance company may take the machine from you if you are not using it regularly.   Please clean the mask, tubings, filter, water reservoir with soapy water every week.  Please use distilled water for the water reservoir.   Please call the office or your machine provider (DME company) if you are having issues with the device.   Return to clinic in 6-8 weeks with NP

## 2017-05-04 NOTE — Assessment & Plan Note (Addendum)
patient has snoring, gasping, choking, has tiredness all day. Non smoker, no asthma or copd.  She has hypersomnia affecting her fxnality. She has HTN since 51 yo. She sleeps 7 hrs/night.  Wakes up unrefreshed in am.  Naps daily. Very symptomatic.   ESS 17.   She is very symptomatic. She is a Aeronautical engineer. Gets sleepy at work.   (-) abnormal behavior in sleep.   Pt had a HST in 01/2017 which showed AHI of 3.  She did not sleep well. She was uncomfortable with the probes. Sleeps 6-7 hrs/night.  Wakes up unrefreshed in am.  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 Remains very symptomatic. (+) sleep talk. Naps on the weekends. Denies opiate or benzo intake.  We ordered a lab sleep study but insurance denied this test.   I was eventually able to let insurance pay for a repeat HST.   She had a repeat home sleep study in May 2018 which showed an AHI of 11. She slept better during the second home sleep study. She remains very symptomatic.    Plan :  We extensively discussed the diagnosis, pathophysiology, and treatment options for Obstructive Sleep Apnea (OSA).   We discussed treatment options for OSA including CPAP, BiPaP, as well as surgical options and oral devices.   We will start patient on autocpap 5-15 cm water. She will need a mask fit. Hopefully her symptoms will improve with CPAP therapy. She does not strike me as someone with depression causing the hypersomnia. She has anxiety however.   Patient was instructed to call the office if he/she has not received the PAP device in 1-2 weeks.  Patient was instructed to have mask, tubings, filter, reservoir cleaned at least once a week with soapy water.  Patient was instructed to call the office if he/she is having issues with the PAP device.    I advised patient to obtain sufficient amount of sleep --  7 to 8 hours at least in a 24 hr period.  Patient was advised to follow good sleep hygiene.  Patient was  advised NOT to engage in activities requiring concentration and/or vigilance if he/she is and  sleepy.  Patient is NOT to drive if he/she is sleepy.

## 2017-05-10 ENCOUNTER — Other Ambulatory Visit: Payer: Self-pay | Admitting: Internal Medicine

## 2017-05-18 DIAGNOSIS — G4733 Obstructive sleep apnea (adult) (pediatric): Secondary | ICD-10-CM | POA: Diagnosis not present

## 2017-05-20 ENCOUNTER — Telehealth: Payer: Self-pay | Admitting: Pulmonary Disease

## 2017-05-20 NOTE — Telephone Encounter (Signed)
Noted  

## 2017-06-17 DIAGNOSIS — G4733 Obstructive sleep apnea (adult) (pediatric): Secondary | ICD-10-CM | POA: Diagnosis not present

## 2017-06-29 ENCOUNTER — Ambulatory Visit (INDEPENDENT_AMBULATORY_CARE_PROVIDER_SITE_OTHER): Payer: BLUE CROSS/BLUE SHIELD | Admitting: Acute Care

## 2017-06-29 ENCOUNTER — Encounter: Payer: Self-pay | Admitting: Acute Care

## 2017-06-29 DIAGNOSIS — G4733 Obstructive sleep apnea (adult) (pediatric): Secondary | ICD-10-CM

## 2017-06-29 NOTE — Assessment & Plan Note (Signed)
Patient is compliant with CPAP AHI his decreased from 11 to 0.7 Plan You are clearly  benefiting from CPAP therapy Continue on CPAP at bedtime. You appear to be benefiting from the treatment Goal is to wear for at least 4-6 hours each night for maximal clinical benefit. Continue to work on weight loss, as the link between excess weight  and sleep apnea is well established.  Do not drive if sleepy. Wash mask, tubings, filter, reservoir cleaned at least once a week with soapy water.  Follow up with 3 months or before as needed.  Please contact office for sooner follow up if symptoms do not improve or worsen or seek emergency care

## 2017-06-29 NOTE — Progress Notes (Signed)
History of Present Illness Gail [name] is a 51 y.o. female former smoker ( [place]) with OSA newly started on CPAP. She was  followed by Dr. Corrie Dandy.   06/29/2017 Follow Up Visit: Pt. Presents for follow up after initiation of CPAP therapy She states she is doing well on her CPAP.She has improvement in daytime sleepiness. She is yawning less. She is less tired. She is sleeping better at night. She denies any mass cor pulmonale issues. She is trying to improve her sleep hygeine. Download indicates a decrease in AHI from 11-0.7 with CPAP use. She is clearly benefiting from CPAP therapy.  Test Results: CPAP download 05/30/2017 through 06/28/2017 Air since 10 auto set 5-15 cm of water Usage 30 of 30 days her 100% Greater than 4 hours 26 days were 87% Less than 4 hours 4 days or 13% Average daily usage 5 hours and 47 minutes Average pressure 9.1 cm of water AHI = 0.7  CBC Latest Ref Rng & Units 07/23/2016 07/25/2015 03/13/2014  WBC 4.0 - 10.5 K/uL 7.2 6.0 6.9  Hemoglobin 12.0 - 15.0 g/dL 14.3 14.3 14.1  Hematocrit 36.0 - 46.0 % 42.4 42.7 41.6  Platelets 150.0 - 400.0 K/uL 210.0 232.0 230.0    BMP Latest Ref Rng & Units 07/23/2016 07/25/2015 03/13/2014  Glucose 70 - 99 mg/dL 98 106(H) 105(H)  BUN 6 - 23 mg/dL 17 16 15   Creatinine 0.40 - 1.20 mg/dL 0.77 0.77 0.9  Sodium 135 - 145 mEq/L 138 139 137  Potassium 3.5 - 5.1 mEq/L 4.0 4.2 4.1  Chloride 96 - 112 mEq/L 106 105 102  CO2 19 - 32 mEq/L 25 25 27   Calcium 8.4 - 10.5 mg/dL 9.1 9.0 9.1    Past medical hx Past Medical History:  Diagnosis Date  . Allergy    History of shots as a child  . Anal fissure   . Arthritis   . History of infertility, female   . Hyperlipidemia   . Hypertension   . IBS (irritable bowel syndrome)   . Microscopic hematuria 2017   urology evaluated no sig cause    . PCO (polycystic ovaries)   . Rosacea      Social History  Substance Use Topics  . Smoking status: Former Smoker    Packs/day: 0.50   Years: 7.00    Types: Cigarettes    Quit date: 12/13/1986  . Smokeless tobacco: Never Used  . Alcohol use No    Tobacco Cessation: Former smoker quit 1988  Past surgical hx, Family hx, Social hx all reviewed.  Current Outpatient Prescriptions on File Prior to Visit  Medication Sig  . Albuterol Sulfate (PROAIR RESPICLICK) 009 (90 Base) MCG/ACT AEPB Inhale 2 puffs into the lungs every 4 (four) hours as needed.  . Cyanocobalamin (B-12) 1000 MCG SUBL Place under the tongue.  Marland Kitchen DILT-XR 240 MG 24 hr capsule TAKE 1 CAPSULE BY MOUTH DAILY. NEEDS APPOINTMENT FOR FUTURE REFILLS.  . fluticasone (FLONASE) 50 MCG/ACT nasal spray 2 sprays per nostril daily.  MUST HAVE OFFICE VISIT FOR FURTHER REFILLS.  . Lactobacillus (ACIDOPHILUS) 10 MG CAPS Take by mouth daily.   Marland Kitchen levonorgestrel (MIRENA) 20 MCG/24HR IUD 1 each by Intrauterine route once.    Marland Kitchen losartan (COZAAR) 50 MG tablet Take 1 tablet (50 mg total) by mouth daily.  . metFORMIN (GLUCOPHAGE-XR) 500 MG 24 hr tablet Take 2 po qd as directed  . nystatin ointment (MYCOSTATIN) Apply topically as directed  . RESTASIS 0.05 % ophthalmic  emulsion   . Zinc Oxide 30.6 % CREA Apply topically as needed.   . [DISCONTINUED] ranitidine (ZANTAC) 300 MG tablet Take 1 tablet (300 mg total) by mouth 2 (two) times daily.   No current facility-administered medications on file prior to visit.      Allergies  Allergen Reactions  . Ace Inhibitors Cough  . Codeine Itching  . Erythromycin Ethylsuccinate     Stomach hurts REACTION: unspecified   can take azithromycin  . Doxycycline Rash  . Latex Rash    Review Of Systems:  Constitutional:   No  weight loss, night sweats,  Fevers, chills, fatigue, or  lassitude.  HEENT:   No headaches,  Difficulty swallowing,  Tooth/dental problems, or  Sore throat,                No sneezing, itching, ear ache,+ nasal congestion, post nasal drip,   CV:  No chest pain,  Orthopnea, PND, swelling in lower extremities, anasarca,  dizziness, palpitations, syncope.   GI  No heartburn, indigestion, abdominal pain, nausea, vomiting, diarrhea, change in bowel habits, loss of appetite, bloody stools.   Resp: No shortness of breath with exertion or at rest.  No excess mucus, no productive cough,  No non-productive cough,  No coughing up of blood.  No change in color of mucus.  No wheezing.  No chest wall deformity  Skin: no rash or lesions.  GU: no dysuria, change in color of urine, no urgency or frequency.  No flank pain, no hematuria   MS:  No joint pain or swelling.  No decreased range of motion.  No back pain.  Psych:  No change in mood or affect. No depression or anxiety.  No memory loss.   Vital Signs BP 122/80 (BP Location: Left Arm, Patient Position: Sitting, Cuff Size: Normal)   Pulse 84   Ht 4' 11.5" (1.511 m)   Wt 183 lb (83 kg)   SpO2 97%   BMI 36.34 kg/m    Physical Exam:  General- No distress,  A&Ox3, pleasant, appropriate ENT: No sinus tenderness, TM clear, pale nasal mucosa, no oral exudate,no post nasal drip, no LAN Cardiac: S1, S2, regular rate and rhythm, no murmur Chest: No wheeze/ rales/ dullness; no accessory muscle use, no nasal flaring, no sternal retractions Abd.: Soft Non-tender, nondistended, bowel sounds positive, obese Ext: No clubbing cyanosis, edema Neuro:  normal strength Skin: No rashes, warm and dry Psych: normal mood and behavior   Assessment/Plan  OSA (obstructive sleep apnea) Patient is compliant with CPAP AHI his decreased from 11 to 0.7 Plan You are clearly  benefiting from CPAP therapy Continue on CPAP at bedtime. You appear to be benefiting from the treatment Goal is to wear for at least 4-6 hours each night for maximal clinical benefit. Continue to work on weight loss, as the link between excess weight  and sleep apnea is well established.  Do not drive if sleepy. Wash mask, tubings, filter, reservoir cleaned at least once a week with soapy water.  Follow  up with 3 months or before as needed.  Please contact office for sooner follow up if symptoms do not improve or worsen or seek emergency care        Magdalen Spatz, NP 06/29/2017  6:06 PM

## 2017-06-29 NOTE — Patient Instructions (Addendum)
It is nice to meet you. You are clearly  benefiting from CPAP therapy Continue on CPAP at bedtime. You appear to be benefiting from the treatment Goal is to wear for at least 4-6 hours each night for maximal clinical benefit. Continue to work on weight loss, as the link between excess weight  and sleep apnea is well established.  Do not drive if sleepy. Wash mask, tubings, filter, reservoir cleaned at least once a week with soapy water.  Follow up with 3 months or before as needed.  Please contact office for sooner follow up if symptoms do not improve or worsen or seek emergency care

## 2017-07-07 DIAGNOSIS — R300 Dysuria: Secondary | ICD-10-CM | POA: Diagnosis not present

## 2017-07-18 DIAGNOSIS — G4733 Obstructive sleep apnea (adult) (pediatric): Secondary | ICD-10-CM | POA: Diagnosis not present

## 2017-07-30 DIAGNOSIS — M5136 Other intervertebral disc degeneration, lumbar region: Secondary | ICD-10-CM | POA: Diagnosis not present

## 2017-08-07 NOTE — Progress Notes (Signed)
Chief Complaint  Patient presents with  . Acute Visit    Pt here today to discuss she states her vaginal area is irratated, and at time discomfort hard to walk, she has been using kenalog cream on it, some reddness     HPI: Gail [name] 51 y.o.  Acute problem  Feels liek sand paper  On bottom itching  More near rectal area.     Sx  In past  Dr Elease Hashimoto and   Gaylord Shih  fgiven steroid cream  Was at beach and  That Friday was very irritate Friday 17th  Rectal area  Like before and no better after trying  The tmc given in past by gyne  tmc   nyasatin worse  Uncertain  What to try   Pin owrns considered in past but no hx and no exposures ROS: See pertinent positives and negatives per HPI. Inc palpitations prob from stress    bg has been good .   Past Medical History:  Diagnosis Date  . Allergy    History of shots as a child  . Anal fissure   . Arthritis   . History of infertility, female   . Hyperlipidemia   . Hypertension   . IBS (irritable bowel syndrome)   . Microscopic hematuria 2017   urology evaluated no sig cause    . PCO (polycystic ovaries)   . Rosacea     Family History  Problem Relation Age of Onset  . Hypertension Mother   . Heart disease Mother   . Hypertension Father   . ALS Father        deceased  . Dementia Maternal Grandfather   . Heart disease Paternal Grandmother   . Dementia Paternal Grandfather   . Hypertension Unknown   . Lung cancer Unknown        uncle  . Breast cancer Unknown        great aunt    Social History   Social History  . Marital status: Married    Spouse name: N/A  . Number of children: 0  . Years of education: N/A   Occupational History  . 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 Smoking status: Former Smoker    Packs/day: 0.50    Years: 7.00    Types: Cigarettes    Quit date: 12/13/1986  . Smokeless tobacco: Never Used  . Alcohol use No  . Drug use: No  . Sexual activity: Not Asked   Comment: married   Other Topics Concern  . 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    Outpatient Medications Prior to Visit  Medication Sig Dispense Refill  . Cyanocobalamin (B-12) 1000 MCG SUBL Place under the tongue.    Marland Kitchen DILT-XR 240 MG 24 hr capsule TAKE 1 CAPSULE BY MOUTH DAILY. NEEDS APPOINTMENT FOR FUTURE REFILLS. 90 capsule 0  . fluticasone (FLONASE) 50 MCG/ACT nasal spray 2 sprays per nostril daily.  MUST HAVE OFFICE VISIT FOR FURTHER REFILLS. 16 g 0  . Lactobacillus (ACIDOPHILUS) 10 MG CAPS Take by mouth daily.     Marland Kitchen levonorgestrel (MIRENA) 20 MCG/24HR IUD 1 each by Intrauterine route once.      Marland Kitchen  losartan (COZAAR) 50 MG tablet Take 1 tablet (50 mg total) by mouth daily. 90 tablet 1  . metFORMIN (GLUCOPHAGE-XR) 500 MG 24 hr tablet Take 2 po qd as directed 180 tablet 3  . nystatin ointment (MYCOSTATIN) Apply topically as directed 30 g 1  . RESTASIS 0.05 % ophthalmic emulsion     . Zinc Oxide 30.6 % CREA Apply topically as needed.     . Albuterol Sulfate (PROAIR RESPICLICK) 403 (90 Base) MCG/ACT AEPB Inhale 2 puffs into the lungs every 4 (four) hours as needed. (Patient not taking: Reported on 08/08/2017) 1 each 0   No facility-administered medications prior to visit.      EXAM:  BP 124/72 (BP Location: Right Arm, Patient Position: Sitting, Cuff Size: Normal)   Pulse 85   Temp 98.3 F (36.8 C) (Oral)   Ht 4' 11.5" (1.511 m)   Wt 184 lb 6.4 oz (83.6 kg)   SpO2 98%   BMI 36.62 kg/m   Body mass index is 36.62 kg/m.  GENERAL: vitals reviewed and listed above, alert, oriented, appears well hydrated and in no acute distress HEENT: atraumatic, conjunctiva  clear, no obvious abnormalities on inspection of external nose and ears OP : no lesion edema or exudate  NECK: no obvious masses on inspection  palpation  CV: HRRR, no clubbing cyanosis or  peripheral edema nl cap refill  Short sem usb no radiation ext gu mod erythema vag and less perianal No rash there no  Mass or fissures noted  MS: moves all extremities without noticeable focal  abnormality PSYCH: pleasant and cooperative, no obvious depression or anxiety Lab Results  Component Value Date   WBC 7.2 07/23/2016   HGB 14.3 07/23/2016   HCT 42.4 07/23/2016   PLT 210.0 07/23/2016   GLUCOSE 98 07/23/2016   CHOL 217 (H) 07/23/2016   TRIG 77.0 07/23/2016   HDL 54.20 07/23/2016   LDLDIRECT 160.7 12/10/2010   LDLCALC 147 (H) 07/23/2016   ALT 15 07/23/2016   AST 14 07/23/2016   NA 138 07/23/2016   K 4.0 07/23/2016   CL 106 07/23/2016   CREATININE 0.77 07/23/2016   BUN 17 07/23/2016   CO2 25 07/23/2016   TSH 2.34 07/23/2016   HGBA1C 5.9 02/23/2017    ASSESSMENT AND PLAN:  Discussed the following assessment and plan:  Anal pruritus  Palpitations  Essential hypertension   Expectant management. And rx  Take the diflucan and  Other antiitch measures   Diff dx discussed  -Patient advised to return or notify health care team  if symptoms worsen ,persist or new concerns arise.  Patient Instructions  Will send in med for poss yeast   . Diflucan      No hemorrhoid seen   On exam   But ok to use prep h or tucks is helps sx    Anal Pruritus Anal pruritus is an itchy feeling in the anus and the skin in the anal area. This is common and can be caused by many things. It often occurs when the area becomes moist. Moisture may be due to sweating or a small amount of stool (feces) that is left on the area because of poor personal cleaning. Some other causes include:  Perfumed soaps and sprays.  Colored toilet paper.  Chemicals in the foods that you eat.  Dietary factors, such as caffeine, beer, milk products, chocolate, nuts, citrus fruits, tomatoes, spicy seasonings, jalapeno peppers, and salsa.  Hemorrhoids, fissures,  infections, and other anal diseases.  Excessive washing.  Overuse of laxatives.  Skin disorders (psoriasis, eczema, or seborrhea).  Some medical disorders, such as diabetes or thyroid problems.  Diarrhea.  STDs (sexually transmitted diseases).  Some cancers.  In many cases, the cause is not known. The itching usually goes away with treatment and home care. Scratching can cause further skin damage. Follow these instructions at home: Pay attention to any changes in your symptoms. Take these actions to help with your itching: Skin Care  Practice good hygiene. ? Clean the anal area gently with wet toilet paper, baby wipes, or a wet washcloth after every bowel movement and at bedtime. ? Avoid using soaps on the anal area. ? Dry the area thoroughly. Pat the area dry with toilet paper or a towel.  Do not scrub the anal area with anything, including toilet paper.  Do not scratch the itchy area. Scratching produces more damage and makes the itching worse.  Take sitz baths in warm water as told by your health care provider. Pat the area dry with a soft cloth after each bath.  Use creams or ointments as told by your health care provider. Zinc oxide ointment or a moisture barrier cream can be applied several times per day to protect the skin.  Do not use anything that irritates the skin, such as bubble baths, scented toilet paper, or genital deodorants. General instructions  Take over-the-counter and prescription medicines only as told by your health care provider.  Talk with your health care provider about fiber supplements. These are helpful in keeping your stool normal if you have frequent loose stools.  Wear cotton underwear and loose clothing.  Keep all follow-up visits as told by your health care provider. This is important. Contact a health care provider if:  Your itching does not improve in several days.  Your itching gets worse.  You have a fever.  You have redness,  swelling, or pain in the anal area.  You have fluid, blood, or pus coming from the anal area. This information is not intended to replace advice given to you by your health care provider. Make sure you discuss any questions you have with your health care provider. Document Released: 05/31/2011 Document Revised: 05/06/2016 Document Reviewed: 02/24/2015 Elsevier Interactive Patient Education  2018 [place]. Panosh M.D.

## 2017-08-08 ENCOUNTER — Ambulatory Visit (INDEPENDENT_AMBULATORY_CARE_PROVIDER_SITE_OTHER): Payer: BLUE CROSS/BLUE SHIELD | Admitting: Internal Medicine

## 2017-08-08 ENCOUNTER — Encounter: Payer: Self-pay | Admitting: Internal Medicine

## 2017-08-08 VITALS — BP 124/72 | HR 85 | Temp 98.3°F | Ht 59.5 in | Wt 184.4 lb

## 2017-08-08 DIAGNOSIS — R002 Palpitations: Secondary | ICD-10-CM

## 2017-08-08 DIAGNOSIS — I10 Essential (primary) hypertension: Secondary | ICD-10-CM | POA: Diagnosis not present

## 2017-08-08 DIAGNOSIS — L290 Pruritus ani: Secondary | ICD-10-CM

## 2017-08-08 MED ORDER — FLUCONAZOLE 150 MG PO TABS: 150.0000 mg | ORAL_TABLET | Freq: Once | ORAL | 0 refills | Status: AC

## 2017-08-08 NOTE — Patient Instructions (Addendum)
Will send in med for poss yeast   . Diflucan      No hemorrhoid seen   On exam   But ok to use prep h or tucks is helps sx    Anal Pruritus Anal pruritus is an itchy feeling in the anus and the skin in the anal area. This is common and can be caused by many things. It often occurs when the area becomes moist. Moisture may be due to sweating or a small amount of stool (feces) that is left on the area because of poor personal cleaning. Some other causes include:  Perfumed soaps and sprays.  Colored toilet paper.  Chemicals in the foods that you eat.  Dietary factors, such as caffeine, beer, milk products, chocolate, nuts, citrus fruits, tomatoes, spicy seasonings, jalapeno peppers, and salsa.  Hemorrhoids, fissures, infections, and other anal diseases.  Excessive washing.  Overuse of laxatives.  Skin disorders (psoriasis, eczema, or seborrhea).  Some medical disorders, such as diabetes or thyroid problems.  Diarrhea.  STDs (sexually transmitted diseases).  Some cancers.  In many cases, the cause is not known. The itching usually goes away with treatment and home care. Scratching can cause further skin damage. Follow these instructions at home: Pay attention to any changes in your symptoms. Take these actions to help with your itching: Skin Care  Practice good hygiene. ? Clean the anal area gently with wet toilet paper, baby wipes, or a wet washcloth after every bowel movement and at bedtime. ? Avoid using soaps on the anal area. ? Dry the area thoroughly. Pat the area dry with toilet paper or a towel.  Do not scrub the anal area with anything, including toilet paper.  Do not scratch the itchy area. Scratching produces more damage and makes the itching worse.  Take sitz baths in warm water as told by your health care provider. Pat the area dry with a soft cloth after each bath.  Use creams or ointments as told by your health care provider. Zinc oxide ointment or a  moisture barrier cream can be applied several times per day to protect the skin.  Do not use anything that irritates the skin, such as bubble baths, scented toilet paper, or genital deodorants. General instructions  Take over-the-counter and prescription medicines only as told by your health care provider.  Talk with your health care provider about fiber supplements. These are helpful in keeping your stool normal if you have frequent loose stools.  Wear cotton underwear and loose clothing.  Keep all follow-up visits as told by your health care provider. This is important. Contact a health care provider if:  Your itching does not improve in several days.  Your itching gets worse.  You have a fever.  You have redness, swelling, or pain in the anal area.  You have fluid, blood, or pus coming from the anal area. This information is not intended to replace advice given to you by your health care provider. Make sure you discuss any questions you have with your health care provider. Document Released: 05/31/2011 Document Revised: 05/06/2016 Document Reviewed: 02/24/2015 Elsevier Interactive Patient Education  [employer].

## 2017-08-18 DIAGNOSIS — G4733 Obstructive sleep apnea (adult) (pediatric): Secondary | ICD-10-CM | POA: Diagnosis not present

## 2017-08-19 ENCOUNTER — Telehealth: Payer: Self-pay | Admitting: Internal Medicine

## 2017-08-23 NOTE — Telephone Encounter (Signed)
Pt need new Rx for diltiazem.  Pharm: [facility] on [address]

## 2017-08-23 NOTE — Telephone Encounter (Signed)
lmom for pt to call back to schedule appointment. °

## 2017-08-23 NOTE — Telephone Encounter (Signed)
Sent in patient blood pressure medication. Patient needs to schedule an appointment for yearly visit. No further refills until visit has been schedule. Please help patient schedule an appointment.

## 2017-09-02 ENCOUNTER — Encounter: Payer: Self-pay | Admitting: Internal Medicine

## 2017-09-17 DIAGNOSIS — G4733 Obstructive sleep apnea (adult) (pediatric): Secondary | ICD-10-CM | POA: Diagnosis not present

## 2017-09-28 ENCOUNTER — Ambulatory Visit (INDEPENDENT_AMBULATORY_CARE_PROVIDER_SITE_OTHER): Payer: BLUE CROSS/BLUE SHIELD | Admitting: Acute Care

## 2017-09-28 ENCOUNTER — Encounter: Payer: Self-pay | Admitting: Acute Care

## 2017-09-28 DIAGNOSIS — G4733 Obstructive sleep apnea (adult) (pediatric): Secondary | ICD-10-CM | POA: Diagnosis not present

## 2017-09-28 NOTE — Assessment & Plan Note (Signed)
Compliant with therapy: AHI= 0.6, benefiting from therapy Plan: Keep up the great work with your CPAP. You are clearly  benefiting from CPAP therapy Continue on CPAP at bedtime. You appear to be benefiting from the treatment Goal is to wear for at least 6 hours each night for maximal clinical benefit. Continue to work on weight loss, as the link between excess weight  and sleep apnea is well established.  Do not drive if sleepy. Wash mask, tubings, filter, reservoir cleaned at least once a week with soapy water.  Follow up with 12 months or before as needed with Dr. Elsworth Soho or [facility]  Please contact office for sooner follow up if symptoms do not improve or worsen or seek emergency care

## 2017-09-28 NOTE — Patient Instructions (Addendum)
It is nice to see you today. Keep up the great work with your CPAP. You are clearly  benefiting from CPAP therapy Continue on CPAP at bedtime. You appear to be benefiting from the treatment Goal is to wear for at least 6 hours each night for maximal clinical benefit. Continue to work on weight loss, as the link between excess weight  and sleep apnea is well established.  Do not drive if sleepy. Wash mask, tubings, filter, reservoir cleaned at least once a week with soapy water.  Follow up with 12 months or before as needed with Dr. Elsworth Soho or [facility]  Please contact office for sooner follow up if symptoms do not improve or worsen or seek emergency care
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
## 2017-09-28 NOTE — Progress Notes (Signed)
I have reviewed and agree with assessment/plan.  Amorah Sebring, MD [facility] 09/28/2017, 6:31 PM Pager:  [phone]  

## 2017-10-03 ENCOUNTER — Other Ambulatory Visit: Payer: Self-pay | Admitting: Internal Medicine

## 2017-10-05 ENCOUNTER — Ambulatory Visit: Payer: BLUE CROSS/BLUE SHIELD | Admitting: Acute Care

## 2017-10-07 NOTE — Progress Notes (Signed)
Chief Complaint  Patient presents with  . Medication Refill    BP meds     HPI: Gail [name] 51 y.o. come in for Chronic disease management palpitations    She was overdue for blood monitoring so her pharmacy refilled emergency medicine she is not ill today otherwise.  She is using boric acid capsules as needed for her perineal itching with great success. Only occasional palpitations no side effect of medicine no syncope.  Sleep doing okay with sleep apnea treatment.  To be followed yearly.  Still same job routine small child exposures.  She has had her flu shot. pcos  Taking metformin 1 per day mostly   Has knee arthritis trying to exercise and asks about taking tumeric  supplementation to see if it helps her pain. ROS: See pertinent positives and negatives per HPI.  Past Medical History:  Diagnosis Date  . Allergy    History of shots as a child  . Anal fissure   . Arthritis   . History of infertility, female   . Hyperlipidemia   . Hypertension   . IBS (irritable bowel syndrome)   . Microscopic hematuria 2017   urology evaluated no sig cause    . PCO (polycystic ovaries)   . Rosacea     Family History  Problem Relation Age of Onset  . Hypertension Mother   . Heart disease Mother   . Hypertension Father   . ALS Father        deceased  . Dementia Maternal Grandfather   . Heart disease Paternal Grandmother   . Dementia Paternal Grandfather   . Hypertension Unknown   . Lung cancer Unknown        uncle  . Breast cancer Unknown        great aunt    Social History   Social History  . Marital status: Married    Spouse name: N/A  . Number of children: 0  . Years of education: N/A   Occupational History  . 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 Smoking status: Former Smoker    Packs/day: 0.50    Years: 7.00    Types: Cigarettes    Quit date: 12/13/1986  . Smokeless tobacco: Never Used  . Alcohol use No  . Drug use: No    . Sexual activity: Not Asked     Comment: married   Other Topics Concern  . 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    Outpatient Medications Prior to Visit  Medication Sig Dispense Refill  . Albuterol Sulfate (PROAIR RESPICLICK) 097 (90 Base) MCG/ACT AEPB Inhale 2 puffs into the lungs every 4 (four) hours as needed. 1 each 0  . Cyanocobalamin (B-12) 1000 MCG SUBL Place under the tongue.    . fluticasone (FLONASE) 50 MCG/ACT nasal spray 2 sprays per nostril daily.  MUST HAVE OFFICE VISIT FOR FURTHER REFILLS. 16 g 0  . Lactobacillus (ACIDOPHILUS) 10 MG CAPS Take by mouth daily.     Marland Kitchen levonorgestrel (MIRENA) 20 MCG/24HR IUD 1 each by Intrauterine route once.      . metFORMIN (GLUCOPHAGE-XR) 500 MG 24 hr tablet Take 2 po qd as directed 180  tablet 3  . nystatin ointment (MYCOSTATIN) Apply topically as directed 30 g 1  . RESTASIS 0.05 % ophthalmic emulsion     . triamcinolone (KENALOG) 0.025 % cream Apply 1 application topically 2 (two) times daily.    . Zinc Oxide 30.6 % CREA Apply topically as needed.     Marland Kitchen DILT-XR 240 MG 24 hr capsule TAKE 1 CAPSULE BY MOUTH DAILY. NEEDS APPOINTMENT FOR FUTURE REFILLS. 90 capsule 0  . losartan (COZAAR) 50 MG tablet TAKE 1 TABLET(50 MG) BY MOUTH DAILY 90 tablet 0   No facility-administered medications prior to visit.      EXAM:  BP (!) 104/58 (BP Location: Right Arm, Patient Position: Sitting, Cuff Size: Normal)   Pulse 83   Temp 98.1 F (36.7 C) (Oral)   Wt 181 lb 14.4 oz (82.5 kg)   BMI 35.52 kg/m   Body mass index is 35.52 kg/m.  GENERAL: vitals reviewed and listed above, alert, oriented, appears well hydrated and in no acute distress HEENT: atraumatic, conjunctiva  clear, no obvious abnormalities on inspection of external nose and ears  NECK: no obvious masses on  inspection palpation  LUNGS: clear to auscultation bilaterally, no wheezes, rales or rhonchi, good air movement CV: HRRR, no clubbing cyanosis or  peripheral edema nl cap refill   2/6 sem usb  No click  .wpbad MS: moves all extremities without noticeable focal  abnormality PSYCH: pleasant and cooperative, no obvious depression or anxiety Lab Results  Component Value Date   WBC 7.2 07/23/2016   HGB 14.3 07/23/2016   HCT 42.4 07/23/2016   PLT 210.0 07/23/2016   GLUCOSE 98 07/23/2016   CHOL 217 (H) 07/23/2016   TRIG 77.0 07/23/2016   HDL 54.20 07/23/2016   LDLDIRECT 160.7 12/10/2010   LDLCALC 147 (H) 07/23/2016   ALT 15 07/23/2016   AST 14 07/23/2016   NA 138 07/23/2016   K 4.0 07/23/2016   CL 106 07/23/2016   CREATININE 0.77 07/23/2016   BUN 17 07/23/2016   CO2 25 07/23/2016   TSH 2.34 07/23/2016   HGBA1C 5.9 02/23/2017   BP Readings from Last 3 Encounters:  10/10/17 (!) 104/58  09/28/17 116/78  08/08/17 124/72   Wt Readings from Last 3 Encounters:  10/10/17 181 lb 14.4 oz (82.5 kg)  09/28/17 184 lb (83.5 kg)  08/08/17 184 lb 6.4 oz (83.6 kg)     ASSESSMENT AND PLAN:  Discussed the following assessment and plan:  Essential hypertension - Plan: CBC with Differential/Platelet, Lipid panel, Comprehensive metabolic panel, Hemoglobin A1c, TSH  Medication management - Plan: CBC with Differential/Platelet, Lipid panel, Comprehensive metabolic panel, Hemoglobin A1c, TSH  PCOS (polycystic ovarian syndrome) - Plan: CBC with Differential/Platelet, Lipid panel, Comprehensive metabolic panel, Hemoglobin A1c, TSH  Hyperlipidemia, unspecified hyperlipidemia type - Plan: CBC with Differential/Platelet, Lipid panel, Comprehensive metabolic panel, Hemoglobin A1c, TSH Blood monitoring due     Seems to be stable at this time doing well continue weight loss.  Change CPX appointment to 3-4 months from now.  Maintain same medication refills when due. boric acid    From [place] .    -Patient advised to return or notify health care team  if  new concerns arise.  Patient Instructions  Change   cpx to jan or febr .  Will notify you  of labs when available.   Continue lifestyle intervention healthy eating and exercise . And medication ...         Standley Brooking. Maxemiliano Riel M.D.

## 2017-10-10 ENCOUNTER — Encounter: Payer: Self-pay | Admitting: Internal Medicine

## 2017-10-10 ENCOUNTER — Ambulatory Visit (INDEPENDENT_AMBULATORY_CARE_PROVIDER_SITE_OTHER): Payer: BLUE CROSS/BLUE SHIELD | Admitting: Internal Medicine

## 2017-10-10 VITALS — BP 104/58 | HR 83 | Temp 98.1°F | Wt 181.9 lb

## 2017-10-10 DIAGNOSIS — Z79899 Other long term (current) drug therapy: Secondary | ICD-10-CM | POA: Diagnosis not present

## 2017-10-10 DIAGNOSIS — E785 Hyperlipidemia, unspecified: Secondary | ICD-10-CM

## 2017-10-10 DIAGNOSIS — E282 Polycystic ovarian syndrome: Secondary | ICD-10-CM | POA: Diagnosis not present

## 2017-10-10 DIAGNOSIS — I10 Essential (primary) hypertension: Secondary | ICD-10-CM | POA: Diagnosis not present

## 2017-10-10 LAB — COMPREHENSIVE METABOLIC PANEL
ALK PHOS: 74 U/L (ref 39–117)
ALT: 11 U/L (ref 0–35)
AST: 14 U/L (ref 0–37)
Albumin: 4.3 g/dL (ref 3.5–5.2)
BILIRUBIN TOTAL: 0.5 mg/dL (ref 0.2–1.2)
BUN: 12 mg/dL (ref 6–23)
CALCIUM: 9.3 mg/dL (ref 8.4–10.5)
CO2: 26 meq/L (ref 19–32)
Chloride: 104 mEq/L (ref 96–112)
Creatinine, Ser: 0.66 mg/dL (ref 0.40–1.20)
GFR: 101.12 mL/min (ref >60.00)
GLUCOSE: 106 mg/dL — AB (ref 70–99)
POTASSIUM: 4.1 meq/L (ref 3.5–5.1)
Sodium: 139 mEq/L (ref 135–145)
Total Protein: 6.8 g/dL (ref 6.0–8.3)

## 2017-10-10 LAB — CBC WITH DIFFERENTIAL/PLATELET
BASOS PCT: 0.7 % (ref 0.0–3.0)
Basophils Absolute: 0 10*3/uL (ref 0.0–0.1)
EOS PCT: 3.3 % (ref 0.0–5.0)
Eosinophils Absolute: 0.2 10*3/uL (ref 0.0–0.7)
HCT: 42.3 % (ref 36.0–46.0)
Hemoglobin: 14.2 g/dL (ref 12.0–15.0)
LYMPHS ABS: 1.5 10*3/uL (ref 0.7–4.0)
Lymphocytes Relative: 26.2 % (ref 12.0–46.0)
MCHC: 33.4 g/dL (ref 30.0–36.0)
MCV: 96 fl (ref 78.0–100.0)
MONOS PCT: 9.2 % (ref 3.0–12.0)
Monocytes Absolute: 0.5 10*3/uL (ref 0.1–1.0)
NEUTROS ABS: 3.5 10*3/uL (ref 1.4–7.7)
Neutrophils Relative %: 60.6 % (ref 43.0–77.0)
PLATELETS: 245 10*3/uL (ref 150.0–400.0)
RBC: 4.41 Mil/uL (ref 3.87–5.11)
RDW: 12.8 % (ref 11.5–15.5)
WBC: 5.7 10*3/uL (ref 4.0–10.5)

## 2017-10-10 LAB — LIPID PANEL
CHOL/HDL RATIO: 4
Cholesterol: 197 mg/dL (ref 0–200)
HDL: 48 mg/dL (ref >39.00)
LDL Cholesterol: 135 mg/dL — ABNORMAL HIGH (ref 0–99)
NONHDL: 148.66
TRIGLYCERIDES: 66 mg/dL (ref 0.0–149.0)
VLDL: 13.2 mg/dL (ref 0.0–40.0)

## 2017-10-10 LAB — HEMOGLOBIN A1C: Hgb A1c MFr Bld: 5.8 % (ref 4.6–6.5)

## 2017-10-10 LAB — TSH: TSH: 1.98 u[IU]/mL (ref 0.35–4.50)

## 2017-10-10 MED ORDER — DILTIAZEM HCL ER 240 MG PO CP24: ORAL_CAPSULE | ORAL | 3 refills | Status: DC

## 2017-10-10 MED ORDER — LOSARTAN POTASSIUM 50 MG PO TABS: ORAL_TABLET | ORAL | 3 refills | Status: DC

## 2017-10-10 NOTE — Patient Instructions (Addendum)
Change   cpx to jan or febr .  Will notify you  of labs when available.   Continue lifestyle intervention healthy eating and exercise . And medication .Marland KitchenMarland Kitchen

## 2017-10-18 DIAGNOSIS — G4733 Obstructive sleep apnea (adult) (pediatric): Secondary | ICD-10-CM | POA: Diagnosis not present

## 2017-11-15 ENCOUNTER — Encounter: Payer: BLUE CROSS/BLUE SHIELD | Admitting: Internal Medicine

## 2017-11-17 DIAGNOSIS — G4733 Obstructive sleep apnea (adult) (pediatric): Secondary | ICD-10-CM | POA: Diagnosis not present

## 2017-11-29 ENCOUNTER — Encounter: Payer: Self-pay | Admitting: Internal Medicine

## 2017-11-29 ENCOUNTER — Other Ambulatory Visit: Payer: Self-pay | Admitting: Internal Medicine

## 2017-11-30 MED ORDER — DILTIAZEM HCL ER 240 MG PO CP24: ORAL_CAPSULE | ORAL | 3 refills | Status: DC

## 2017-12-05 ENCOUNTER — Other Ambulatory Visit: Payer: Self-pay

## 2017-12-05 MED ORDER — DILTIAZEM HCL ER 240 MG PO CP24: ORAL_CAPSULE | ORAL | 3 refills | Status: DC

## 2017-12-12 ENCOUNTER — Ambulatory Visit: Payer: BLUE CROSS/BLUE SHIELD | Admitting: Family Medicine

## 2017-12-12 ENCOUNTER — Encounter: Payer: Self-pay | Admitting: Family Medicine

## 2017-12-12 VITALS — BP 124/72 | HR 99 | Temp 98.8°F | Ht 60.0 in | Wt 178.0 lb

## 2017-12-12 DIAGNOSIS — R05 Cough: Secondary | ICD-10-CM

## 2017-12-12 DIAGNOSIS — R059 Cough, unspecified: Secondary | ICD-10-CM

## 2017-12-12 MED ORDER — AZITHROMYCIN 250 MG PO TABS: ORAL_TABLET | ORAL | 0 refills | Status: DC

## 2017-12-12 MED ORDER — HYDROCODONE-HOMATROPINE 5-1.5 MG/5ML PO SYRP: 5.0000 mL | ORAL_SOLUTION | Freq: Three times a day (TID) | ORAL | 0 refills | Status: DC | PRN

## 2017-12-12 MED ORDER — IPRATROPIUM BROMIDE 0.06 % NA SOLN: 2.0000 | Freq: Four times a day (QID) | NASAL | 0 refills | Status: DC

## 2017-12-12 NOTE — Progress Notes (Signed)
   Subjective:  Gail [name] is a 51 y.o. female who presents today with a chief complaint of cough.   HPI:  Cough, Acute Issue Started about 4 days ago. Worsened over that time. Associated symptoms include rhinorrhea, facial pain, lethargy, nasal congestion, and sore throat. No fevers or chills. Takes delsym which has not significantly helped. Some wheezing at night.  Worse with several young children who have also been sick.  No other obvious precipitating events.  No other obvious alleviating or aggravating factors.  ROS: Per HPI  PMH: Former smoker.   Objective:  Physical Exam: BP 124/72 (BP Location: Left Arm, Patient Position: Sitting, Cuff Size: Normal)   Pulse 99   Temp 98.8 F (37.1 C) (Oral)   Ht 5' (1.524 m)   Wt 178 lb (80.7 kg)   SpO2 97%   BMI 34.76 kg/m   Gen: NAD, resting comfortably HEENT: TMs with clear effusion bilaterally.  Maxillary sinuses with normal transillumination bilaterally.  Oropharynx slightly erythematous without exudate.  No lymphadenopathy. CV: RRR with no murmurs appreciated Pulm: NWOB, CTAB with no crackles, wheezes, or rhonchi  Assessment/Plan:  Cough No signs of bacterial infection.  Start Atrovent nasal spray.  Patient also requested a prescription for Hycodan-says that this iworked well for her in the past.  Sent in a prescription for this.  Discussed risks and side effects of this.  Also send in a "pocket prescription" for azithromycin with strict instruction to not start unless symptoms worsen or fail to improve over the next 3-5 days.  Encouraged good oral hydration.  Recommended Tylenol and/or Motrin as needed for low-grade fever and pain.  Return precautions reviewed.  Follow-up as needed.  Algis Greenhouse. Jerline Pain, MD 12/12/2017 11:03 AM

## 2017-12-12 NOTE — Patient Instructions (Signed)
Start the atrovent and hycodan.  Star the azithromycin if symptoms not improving in a few days or if worsening.  Take care, Dr Jerline Pain

## 2018-01-14 ENCOUNTER — Other Ambulatory Visit: Payer: Self-pay | Admitting: Internal Medicine

## 2018-02-03 NOTE — Progress Notes (Signed)
Chief Complaint  Patient presents with  . Annual Exam    Pt feels that she is getting sick. Pt having cough, sneezing, runny nose, congestion and headache x 3 days. Denies fever and chills. Pt has been exposed to the flu.     HPI: Patient  Gail [name]  52 y.o. comes in today for Preventive Health Care visit  But also 3+ dayus aof malaise and above  No documented feer   Is in day care   Coughing  And feels bad. Sneezing  And stopped nose.  Feels tired.   Many kids in 47 yo class home with flu last week documented . She had immunization.   Only taking 1 metformin doing ok  tryung to lose weight .  BP has been good last checked 123 range   Health Maintenance  Topic Date Due  . HIV Screening  08/31/1983  . PAP SMEAR  03/25/2019  . TETANUS/TDAP  11/26/2019  . INFLUENZA VACCINE  Completed   Health Maintenance Review LIFESTYLE:  Exercise:  Dog walking  And  Care .  Tobacco/ETS: no Alcohol:  ocass  Sugar beverages: no Sleep: around 5-6  Drug use: no HH of  2 pet dog .  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 GI /GU: No adominal pain, vomiting, change in bowel habits. No blood in the stool. No significant GU symptoms. SKIN/HEME: ,no acute skin rashes suspicious lesions or bleeding. No lymphadenopathy, nodules, masses.  NEURO/ PSYCH:  No neurologic signs such as weakness numbness. No depression anxiety. IMM/ Allergy: No unusual infections.  Allergy .   REST of 12 system review negative except as per HPI   Past Medical History:  Diagnosis Date  . Allergy    History of shots as a child  . Anal fissure   . Arthritis   . History of infertility, female   . Hyperlipidemia   . Hypertension   . IBS (irritable bowel syndrome)   . Microscopic hematuria 2017   urology evaluated no sig cause     . PCO (polycystic ovaries)   . Rosacea     Past Surgical History:  Procedure Laterality Date  . CARPAL TUNNEL RELEASE     x2 bilateral  . KNEE ARTHROSCOPY     left   . WISDOM TOOTH EXTRACTION    . wrist tendon release     bilateral thumbs    Family History  Problem Relation Age of Onset  . Hypertension Mother   . Heart disease Mother   . Hypertension Father   . ALS Father        deceased  . Dementia Maternal Grandfather   . Heart disease Paternal Grandmother   . Dementia Paternal Grandfather   . Hypertension Unknown   . Lung cancer Unknown        uncle  . Breast cancer Unknown        great aunt    Social History   Socioeconomic History  . Marital status: Married    Spouse name: None  . Number of children: 0  . Years of education: None  . Highest education level: None  Social Needs  . Financial resource strain: None  . Food insecurity - worry: None  . Food insecurity - inability: None  . Transportation needs -  medical: None  . Transportation needs - non-medical: None  Occupational History  . Occupation: INFANT TEACHER    Employer: [employer]  Tobacco Use  . Smoking status: Former Smoker    Packs/day: 0.50    Years: 7.00    Pack years: 3.50    Types: Cigarettes    Last attempt to quit: 12/13/1986    Years since quitting: 31.1  . Smokeless tobacco: Never Used  Substance and Sexual Activity  . Alcohol use: No    Alcohol/week: 0.0 oz  . Drug use: No  . Sexual activity: None    Comment: married  Other Topics Concern  . 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    Outpatient Medications Prior to Visit  Medication Sig Dispense Refill  . Albuterol Sulfate (PROAIR RESPICLICK) 174 (90 Base) MCG/ACT AEPB Inhale 2 puffs into the lungs every 4 (four) hours as needed. 1 each 0   . azithromycin (ZITHROMAX) 250 MG tablet Take 2 tabs day 1, then 1 tab daily 6 each 0  . Cyanocobalamin (B-12) 1000 MCG SUBL Place under the tongue.    . diltiazem (DILT-XR) 240 MG 24 hr capsule TAKE 1 CAPSULE BY MOUTH DAILY. 90 capsule 3  . fluticasone (FLONASE) 50 MCG/ACT nasal spray 2 sprays per nostril daily.  MUST HAVE OFFICE VISIT FOR FURTHER REFILLS. 16 g 0  . HYDROcodone-homatropine (HYCODAN) 5-1.5 MG/5ML syrup Take 5 mLs by mouth every 8 (eight) hours as needed for cough. 120 mL 0  . ipratropium (ATROVENT) 0.06 % nasal spray Place 2 sprays into both nostrils 4 (four) times daily. 15 mL 0  . Lactobacillus (ACIDOPHILUS) 10 MG CAPS Take by mouth daily.     Marland Kitchen levonorgestrel (MIRENA) 20 MCG/24HR IUD 1 each by Intrauterine route once.      Marland Kitchen losartan (COZAAR) 50 MG tablet TAKE 1 TABLET(50 MG) BY MOUTH DAILY 90 tablet 3  . losartan (COZAAR) 50 MG tablet TAKE 1 TABLET(50 MG) BY MOUTH DAILY 90 tablet 1  . metFORMIN (GLUCOPHAGE-XR) 500 MG 24 hr tablet Take 2 po qd as directed 180 tablet 3  . RESTASIS 0.05 % ophthalmic emulsion     . triamcinolone (KENALOG) 0.025 % cream Apply 1 application topically 2 (two) times daily.    . Zinc Oxide 30.6 % CREA Apply topically as needed.     . nystatin ointment (MYCOSTATIN) Apply topically as directed 30 g 1   No facility-administered medications prior to visit.      EXAM:  BP (!) 142/80 (BP Location: Right Arm, Patient Position: Sitting, Cuff Size: Normal)   Pulse (!) 113   Temp 97.9 F (36.6 C) (Oral)   Wt 175 lb 9.6 oz (79.7 kg)   BMI 34.29 kg/m   Body mass index is 34.29 kg/m. Wt Readings from Last 3 Encounters:  02/06/18 175 lb 9.6 oz (79.7 kg)  12/12/17 178 lb (80.7 kg)  10/10/17 181 lb 14.4 oz (82.5 kg)    Physical Exam: Vital signs reviewed [id number] is a well-developed well-nourished alert cooperative    who appearsr stated age in no acute distress.   Ur congestion non toxic but under the weather  HEENT: normocephalic atraumatic ,  Eyes: PERRL EOM's full, conjunctiva clear, Nares: paten,t  no deformity discharge or tenderness. Very congested , Ears: no deformity EAC's clear TMs with normal landmarks. Mouth: clear OP, no lesions, edema.  Moist mucous membranes. Dentition in adequate repair. NECK: supple without masses, thyromegaly or bruits. CHEST/PULM:  Clear to auscultation and percussion breath sounds equal no wheeze , rales or rhonchi. No chest wall deformities or tenderness. Breast: normal by inspection . No dimpling, discharge, masses, tenderness or discharge . CV: PMI is nondisplaced, S1 S2 no gallops,  rubs.  Short sem lusb no radaition Peripheral pulses are full without delay.No JVD .  ABDOMEN: Bowel sounds normal nontender  No guard or rebound, no hepato splenomegal no CVA tenderness.   Extremtities:  No clubbing cyanosis or edema, no acute joint swelling or redness no focal atrophy NEURO:  Oriented x3, cranial nerves 3-12 appear to be intact, no obvious focal weakness,gait within normal limits no abnormal reflexes or asymmetrical SKIN: No acute rashes normal turgor, color, no bruising or petechiae. PSYCH: Oriented, good eye contact, no obvious depression anxiety, cognition and judgment appear normal. LN: no cervical axillary inguinal adenopathy  Lab Results  Component Value Date   WBC 5.7 10/10/2017   HGB 14.2 10/10/2017   HCT 42.3 10/10/2017   PLT 245.0 10/10/2017   GLUCOSE 106 (H) 10/10/2017   CHOL 197 10/10/2017   TRIG 66.0 10/10/2017   HDL 48.00 10/10/2017   LDLDIRECT 160.7 12/10/2010   LDLCALC 135 (H) 10/10/2017   ALT 11 10/10/2017   AST 14 10/10/2017   NA 139 10/10/2017   K 4.1 10/10/2017   CL 104 10/10/2017   CREATININE 0.66 10/10/2017   BUN 12 10/10/2017   CO2 26 10/10/2017   TSH 1.98 10/10/2017   HGBA1C 5.7 02/06/2018    BP Readings from Last 3 Encounters:  02/06/18 (!) 142/80  12/12/17 124/72  10/10/17 (!) 104/58  flu screen neg   Lab results reviewed with patient   ASSESSMENT AND  PLAN:  Discussed the following assessment and plan:  Visit for preventive health examination  Medication management - Plan: POC HgB A1c  Essential hypertension  Hyperlipidemia, unspecified hyperlipidemia type  Fasting hyperglycemia - Plan: POC HgB A1c  Flu-like symptoms - Plan: POC Influenza A/B  Exposure to the flu - Plan: POC Influenza A/B  Need for influenza vaccination - Plan: POC Influenza A/B Follow a1c    Cont weight loss and bp control   Disc metformin ok to say  At same dose   Disc safety of  Metformin  Printed UTD on prevention of diabetes  Follow up if  persistent or progressive resp issues .  Ok to refill the nystatin ointment  Patient Care Team: Panosh, Standley Brooking, MD as PCP - General (Internal Medicine) Satira Sark, MD (Cardiology) Cheri Fowler, MD as Consulting Physician (Obstetrics and Gynecology) Patient Instructions  Continue lifestyle intervention healthy eating and exercise .   consideration of metformin 750 xr twice a day has prevented  Or delayed onset  Of diabetes  In people with pcos .      Health Maintenance, Female Adopting a healthy lifestyle and getting preventive care can go a long way to promote health and wellness. Talk with your health care provider about what schedule of regular examinations is right for you. This is a good chance for you to check in with your provider about disease prevention and staying healthy. In between checkups, there are plenty of things you can do on your own. Experts have done a lot of research about which lifestyle  changes and preventive measures are most likely to keep you healthy. Ask your health care provider for more information. Weight and diet Eat a healthy diet  Be sure to include plenty of vegetables, fruits, low-fat dairy products, and lean protein.  Do not eat a lot of foods high in solid fats, added sugars, or salt.  Get regular exercise. This is one of the most important things you can do for  your health. ? Most adults should exercise for at least 150 minutes each week. The exercise should increase your heart rate and make you sweat (moderate-intensity exercise). ? Most adults should also do strengthening exercises at least twice a week. This is in addition to the moderate-intensity exercise.  Maintain a healthy weight  Body mass index (BMI) is a measurement that can be used to identify possible weight problems. It estimates body fat based on height and weight. Your health care provider can help determine your BMI and help you achieve or maintain a healthy weight.  For females 66 years of age and older: ? A BMI below 18.5 is considered underweight. ? A BMI of 18.5 to 24.9 is normal. ? A BMI of 25 to 29.9 is considered overweight. ? A BMI of 30 and above is considered obese.  Watch levels of cholesterol and blood lipids  You should start having your blood tested for lipids and cholesterol at 50 years of age, then have this test every 5 years.  You may need to have your cholesterol levels checked more often if: ? Your lipid or cholesterol levels are high. ? You are older than 50 years of age. ? You are at high risk for heart disease.  Cancer screening Lung Cancer  Lung cancer screening is recommended for adults 35-58 years old who are at high risk for lung cancer because of a history of smoking.  A yearly low-dose CT scan of the lungs is recommended for people who: ? Currently smoke. ? Have quit within the past 15 years. ? Have at least a 30-pack-year history of smoking. A pack year is smoking an average of one pack of cigarettes a day for 1 year.  Yearly screening should continue until it has been 15 years since you quit.  Yearly screening should stop if you develop a health problem that would prevent you from having lung cancer treatment.  Breast Cancer  Practice breast self-awareness. This means understanding how your breasts normally appear and feel.  It also  means doing regular breast self-exams. Let your health care provider know about any changes, no matter how small.  If you are in your 20s or 30s, you should have a clinical breast exam (CBE) by a health care provider every 1-3 years as part of a regular health exam.  If you are 23 or older, have a CBE every year. Also consider having a breast X-ray (mammogram) every year.  If you have a family history of breast cancer, talk to your health care provider about genetic screening.  If you are at high risk for breast cancer, talk to your health care provider about having an MRI and a mammogram every year.  Breast cancer gene (BRCA) assessment is recommended for women who have family members with BRCA-related cancers. BRCA-related cancers include: ? Breast. ? Ovarian. ? Tubal. ? Peritoneal cancers.  Results of the assessment will determine the need for genetic counseling and BRCA1 and BRCA2 testing.  Cervical Cancer Your health care provider may recommend that you be screened regularly for  cancer of the pelvic organs (ovaries, uterus, and vagina). This screening involves a pelvic examination, including checking for microscopic changes to the surface of your cervix (Pap test). You may be encouraged to have this screening done every 3 years, beginning at age 46.  For women ages 84-65, health care providers may recommend pelvic exams and Pap testing every 3 years, or they may recommend the Pap and pelvic exam, combined with testing for human papilloma virus (HPV), every 5 years. Some types of HPV increase your risk of cervical cancer. Testing for HPV may also be done on women of any age with unclear Pap test results.  Other health care providers may not recommend any screening for nonpregnant women who are considered low risk for pelvic cancer and who do not have symptoms. Ask your health care provider if a screening pelvic exam is right for you.  If you have had past treatment for cervical cancer or  a condition that could lead to cancer, you need Pap tests and screening for cancer for at least 20 years after your treatment. If Pap tests have been discontinued, your risk factors (such as having a new sexual partner) need to be reassessed to determine if screening should resume. Some women have medical problems that increase the chance of getting cervical cancer. In these cases, your health care provider may recommend more frequent screening and Pap tests.  Colorectal Cancer  This type of cancer can be detected and often prevented.  Routine colorectal cancer screening usually begins at 50 years of age and continues through 50 years of age.  Your health care provider may recommend screening at an earlier age if you have risk factors for colon cancer.  Your health care provider may also recommend using home test kits to check for hidden blood in the stool.  A small camera at the end of a tube can be used to examine your colon directly (sigmoidoscopy or colonoscopy). This is done to check for the earliest forms of colorectal cancer.  Routine screening usually begins at age 58.  Direct examination of the colon should be repeated every 5-10 years through 50 years of age. However, you may need to be screened more often if early forms of precancerous polyps or small growths are found.  Skin Cancer  Check your skin from head to toe regularly.  Tell your health care provider about any new moles or changes in moles, especially if there is a change in a mole's shape or color.  Also tell your health care provider if you have a mole that is larger than the size of a pencil eraser.  Always use sunscreen. Apply sunscreen liberally and repeatedly throughout the day.  Protect yourself by wearing long sleeves, pants, a wide-brimmed hat, and sunglasses whenever you are outside.  Heart disease, diabetes, and high blood pressure  High blood pressure causes heart disease and increases the risk of  stroke. High blood pressure is more likely to develop in: ? People who have blood pressure in the high end of the normal range (130-139/85-89 mm Hg). ? People who are overweight or obese. ? People who are African American.  If you are 76-66 years of age, have your blood pressure checked every 3-5 years. If you are 32 years of age or older, have your blood pressure checked every year. You should have your blood pressure measured twice-once when you are at a hospital or clinic, and once when you are not at a hospital or clinic. Record  the average of the two measurements. To check your blood pressure when you are not at a hospital or clinic, you can use: ? An automated blood pressure machine at a pharmacy. ? A home blood pressure monitor.  If you are between 16 years and 51 years old, ask your health care provider if you should take aspirin to prevent strokes.  Have regular diabetes screenings. This involves taking a blood sample to check your fasting blood sugar level. ? If you are at a normal weight and have a low risk for diabetes, have this test once every three years after 49 years of age. ? If you are overweight and have a high risk for diabetes, consider being tested at a younger age or more often. Preventing infection Hepatitis B  If you have a higher risk for hepatitis B, you should be screened for this virus. You are considered at high risk for hepatitis B if: ? You were born in a country where hepatitis B is common. Ask your health care provider which countries are considered high risk. ? Your parents were born in a high-risk country, and you have not been immunized against hepatitis B (hepatitis B vaccine). ? You have HIV or AIDS. ? You use needles to inject street drugs. ? You live with someone who has hepatitis B. ? You have had sex with someone who has hepatitis B. ? You get hemodialysis treatment. ? You take certain medicines for conditions, including cancer, organ  transplantation, and autoimmune conditions.  Hepatitis C  Blood testing is recommended for: ? Everyone born from [birth year] through 1963. ? Anyone with known risk factors for hepatitis C.  Sexually transmitted infections (STIs)  You should be screened for sexually transmitted infections (STIs) including gonorrhea and chlamydia if: ? You are sexually active and are younger than 50 years of age. ? You are older than 50 years of age and your health care provider tells you that you are at risk for this type of infection. ? Your sexual activity has changed since you were last screened and you are at an increased risk for chlamydia or gonorrhea. Ask your health care provider if you are at risk.  If you do not have HIV, but are at risk, it may be recommended that you take a prescription medicine daily to prevent HIV infection. This is called pre-exposure prophylaxis (PrEP). You are considered at risk if: ? You are sexually active and do not regularly use condoms or know the HIV status of your partner(s). ? You take drugs by injection. ? You are sexually active with a partner who has HIV.  Talk with your health care provider about whether you are at high risk of being infected with HIV. If you choose to begin PrEP, you should first be tested for HIV. You should then be tested every 3 months for as long as you are taking PrEP. Pregnancy  If you are premenopausal and you may become pregnant, ask your health care provider about preconception counseling.  If you may become pregnant, take 400 to 800 micrograms (mcg) of folic acid every day.  If you want to prevent pregnancy, talk to your health care provider about birth control (contraception). Osteoporosis and menopause  Osteoporosis is a disease in which the bones lose minerals and strength with aging. This can result in serious bone fractures. Your risk for osteoporosis can be identified using a bone density scan.  If you are 78 years of age or  older, or if you  are at risk for osteoporosis and fractures, ask your health care provider if you should be screened.  Ask your health care provider whether you should take a calcium or vitamin D supplement to lower your risk for osteoporosis.  Menopause may have certain physical symptoms and risks.  Hormone replacement therapy may reduce some of these symptoms and risks. Talk to your health care provider about whether hormone replacement therapy is right for you. Follow these instructions at home:  Schedule regular health, dental, and eye exams.  Stay current with your immunizations.  Do not use any tobacco products including cigarettes, chewing tobacco, or electronic cigarettes.  If you are pregnant, do not drink alcohol.  If you are breastfeeding, limit how much and how often you drink alcohol.  Limit alcohol intake to no more than 1 drink per day for nonpregnant women. One drink equals 12 ounces of beer, 5 ounces of wine, or 1 ounces of hard liquor.  Do not use street drugs.  Do not share needles.  Ask your health care provider for help if you need support or information about quitting drugs.  Tell your health care provider if you often feel depressed.  Tell your health care provider if you have ever been abused or do not feel safe at home. This information is not intended to replace advice given to you by your health care provider. Make sure you discuss any questions you have with your health care provider. Document Released: 06/14/2011 Document Revised: 05/06/2016 Document Reviewed: 09/02/2015 Elsevier Interactive Patient Education  2018 [employer].   Hg a1c is 5.7  Very good    Standley Brooking. Panosh M.D.

## 2018-02-06 ENCOUNTER — Ambulatory Visit (INDEPENDENT_AMBULATORY_CARE_PROVIDER_SITE_OTHER): Payer: BLUE CROSS/BLUE SHIELD | Admitting: Internal Medicine

## 2018-02-06 ENCOUNTER — Encounter: Payer: Self-pay | Admitting: Internal Medicine

## 2018-02-06 VITALS — BP 142/80 | HR 113 | Temp 97.9°F | Wt 175.6 lb

## 2018-02-06 DIAGNOSIS — R6889 Other general symptoms and signs: Secondary | ICD-10-CM

## 2018-02-06 DIAGNOSIS — Z23 Encounter for immunization: Secondary | ICD-10-CM

## 2018-02-06 DIAGNOSIS — I10 Essential (primary) hypertension: Secondary | ICD-10-CM

## 2018-02-06 DIAGNOSIS — Z0000 Encounter for general adult medical examination without abnormal findings: Secondary | ICD-10-CM | POA: Diagnosis not present

## 2018-02-06 DIAGNOSIS — E785 Hyperlipidemia, unspecified: Secondary | ICD-10-CM | POA: Diagnosis not present

## 2018-02-06 DIAGNOSIS — Z20828 Contact with and (suspected) exposure to other viral communicable diseases: Secondary | ICD-10-CM | POA: Diagnosis not present

## 2018-02-06 DIAGNOSIS — Z79899 Other long term (current) drug therapy: Secondary | ICD-10-CM

## 2018-02-06 DIAGNOSIS — R7301 Impaired fasting glucose: Secondary | ICD-10-CM | POA: Diagnosis not present

## 2018-02-06 LAB — POCT INFLUENZA A/B
Influenza A, POC: NEGATIVE
Influenza B, POC: NEGATIVE

## 2018-02-06 LAB — POCT GLYCOSYLATED HEMOGLOBIN (HGB A1C): HEMOGLOBIN A1C: 5.7

## 2018-02-06 MED ORDER — NYSTATIN 100000 UNIT/GM EX OINT: TOPICAL_OINTMENT | CUTANEOUS | 3 refills | Status: DC

## 2018-02-06 NOTE — Patient Instructions (Addendum)
Continue lifestyle intervention healthy eating and exercise .   consideration of metformin 750 xr twice a day has prevented  Or delayed onset  Of diabetes  In people with pcos .      Health Maintenance, Female Adopting a healthy lifestyle and getting preventive care can go a long way to promote health and wellness. Talk with your health care provider about what schedule of regular examinations is right for you. This is a good chance for you to check in with your provider about disease prevention and staying healthy. In between checkups, there are plenty of things you can do on your own. Experts have done a lot of research about which lifestyle changes and preventive measures are most likely to keep you healthy. Ask your health care provider for more information. Weight and diet Eat a healthy diet  Be sure to include plenty of vegetables, fruits, low-fat dairy products, and lean protein.  Do not eat a lot of foods high in solid fats, added sugars, or salt.  Get regular exercise. This is one of the most important things you can do for your health. ? Most adults should exercise for at least 150 minutes each week. The exercise should increase your heart rate and make you sweat (moderate-intensity exercise). ? Most adults should also do strengthening exercises at least twice a week. This is in addition to the moderate-intensity exercise.  Maintain a healthy weight  Body mass index (BMI) is a measurement that can be used to identify possible weight problems. It estimates body fat based on height and weight. Your health care provider can help determine your BMI and help you achieve or maintain a healthy weight.  For females 81 years of age and older: ? A BMI below 18.5 is considered underweight. ? A BMI of 18.5 to 24.9 is normal. ? A BMI of 25 to 29.9 is considered overweight. ? A BMI of 30 and above is considered obese.  Watch levels of cholesterol and blood lipids  You should start having  your blood tested for lipids and cholesterol at 50 years of age, then have this test every 5 years.  You may need to have your cholesterol levels checked more often if: ? Your lipid or cholesterol levels are high. ? You are older than 50 years of age. ? You are at high risk for heart disease.  Cancer screening Lung Cancer  Lung cancer screening is recommended for adults 7-3 years old who are at high risk for lung cancer because of a history of smoking.  A yearly low-dose CT scan of the lungs is recommended for people who: ? Currently smoke. ? Have quit within the past 15 years. ? Have at least a 30-pack-year history of smoking. A pack year is smoking an average of one pack of cigarettes a day for 1 year.  Yearly screening should continue until it has been 15 years since you quit.  Yearly screening should stop if you develop a health problem that would prevent you from having lung cancer treatment.  Breast Cancer  Practice breast self-awareness. This means understanding how your breasts normally appear and feel.  It also means doing regular breast self-exams. Let your health care provider know about any changes, no matter how small.  If you are in your 20s or 30s, you should have a clinical breast exam (CBE) by a health care provider every 1-3 years as part of a regular health exam.  If you are 34 or older, have a  CBE every year. Also consider having a breast X-ray (mammogram) every year.  If you have a family history of breast cancer, talk to your health care provider about genetic screening.  If you are at high risk for breast cancer, talk to your health care provider about having an MRI and a mammogram every year.  Breast cancer gene (BRCA) assessment is recommended for women who have family members with BRCA-related cancers. BRCA-related cancers include: ? Breast. ? Ovarian. ? Tubal. ? Peritoneal cancers.  Results of the assessment will determine the need for genetic  counseling and BRCA1 and BRCA2 testing.  Cervical Cancer Your health care provider may recommend that you be screened regularly for cancer of the pelvic organs (ovaries, uterus, and vagina). This screening involves a pelvic examination, including checking for microscopic changes to the surface of your cervix (Pap test). You may be encouraged to have this screening done every 3 years, beginning at age 77.  For women ages 60-65, health care providers may recommend pelvic exams and Pap testing every 3 years, or they may recommend the Pap and pelvic exam, combined with testing for human papilloma virus (HPV), every 5 years. Some types of HPV increase your risk of cervical cancer. Testing for HPV may also be done on women of any age with unclear Pap test results.  Other health care providers may not recommend any screening for nonpregnant women who are considered low risk for pelvic cancer and who do not have symptoms. Ask your health care provider if a screening pelvic exam is right for you.  If you have had past treatment for cervical cancer or a condition that could lead to cancer, you need Pap tests and screening for cancer for at least 20 years after your treatment. If Pap tests have been discontinued, your risk factors (such as having a new sexual partner) need to be reassessed to determine if screening should resume. Some women have medical problems that increase the chance of getting cervical cancer. In these cases, your health care provider may recommend more frequent screening and Pap tests.  Colorectal Cancer  This type of cancer can be detected and often prevented.  Routine colorectal cancer screening usually begins at 50 years of age and continues through 50 years of age.  Your health care provider may recommend screening at an earlier age if you have risk factors for colon cancer.  Your health care provider may also recommend using home test kits to check for hidden blood in the  stool.  A small camera at the end of a tube can be used to examine your colon directly (sigmoidoscopy or colonoscopy). This is done to check for the earliest forms of colorectal cancer.  Routine screening usually begins at age 68.  Direct examination of the colon should be repeated every 5-10 years through 50 years of age. However, you may need to be screened more often if early forms of precancerous polyps or small growths are found.  Skin Cancer  Check your skin from head to toe regularly.  Tell your health care provider about any new moles or changes in moles, especially if there is a change in a mole's shape or color.  Also tell your health care provider if you have a mole that is larger than the size of a pencil eraser.  Always use sunscreen. Apply sunscreen liberally and repeatedly throughout the day.  Protect yourself by wearing long sleeves, pants, a wide-brimmed hat, and sunglasses whenever you are outside.  Heart disease,  diabetes, and high blood pressure  High blood pressure causes heart disease and increases the risk of stroke. High blood pressure is more likely to develop in: ? People who have blood pressure in the high end of the normal range (130-139/85-89 mm Hg). ? People who are overweight or obese. ? People who are African American.  If you are 35-41 years of age, have your blood pressure checked every 3-5 years. If you are 68 years of age or older, have your blood pressure checked every year. You should have your blood pressure measured twice-once when you are at a hospital or clinic, and once when you are not at a hospital or clinic. Record the average of the two measurements. To check your blood pressure when you are not at a hospital or clinic, you can use: ? An automated blood pressure machine at a pharmacy. ? A home blood pressure monitor.  If you are between 62 years and 22 years old, ask your health care provider if you should take aspirin to prevent  strokes.  Have regular diabetes screenings. This involves taking a blood sample to check your fasting blood sugar level. ? If you are at a normal weight and have a low risk for diabetes, have this test once every three years after 50 years of age. ? If you are overweight and have a high risk for diabetes, consider being tested at a younger age or more often. Preventing infection Hepatitis B  If you have a higher risk for hepatitis B, you should be screened for this virus. You are considered at high risk for hepatitis B if: ? You were born in a country where hepatitis B is common. Ask your health care provider which countries are considered high risk. ? Your parents were born in a high-risk country, and you have not been immunized against hepatitis B (hepatitis B vaccine). ? You have HIV or AIDS. ? You use needles to inject street drugs. ? You live with someone who has hepatitis B. ? You have had sex with someone who has hepatitis B. ? You get hemodialysis treatment. ? You take certain medicines for conditions, including cancer, organ transplantation, and autoimmune conditions.  Hepatitis C  Blood testing is recommended for: ? Everyone born from [birth year] through 1963. ? Anyone with known risk factors for hepatitis C.  Sexually transmitted infections (STIs)  You should be screened for sexually transmitted infections (STIs) including gonorrhea and chlamydia if: ? You are sexually active and are younger than 50 years of age. ? You are older than 49 years of age and your health care provider tells you that you are at risk for this type of infection. ? Your sexual activity has changed since you were last screened and you are at an increased risk for chlamydia or gonorrhea. Ask your health care provider if you are at risk.  If you do not have HIV, but are at risk, it Michaelis be recommended that you take a prescription medicine daily to prevent HIV infection. This is called pre-exposure prophylaxis  (PrEP). You are considered at risk if: ? You are sexually active and do not regularly use condoms or know the HIV status of your partner(s). ? You take drugs by injection. ? You are sexually active with a partner who has HIV.  Talk with your health care provider about whether you are at high risk of being infected with HIV. If you choose to begin PrEP, you should first be tested for HIV. You should  then be tested every 3 months for as long as you are taking PrEP. Pregnancy  If you are premenopausal and you may become pregnant, ask your health care provider about preconception counseling.  If you may become pregnant, take 400 to 800 micrograms (mcg) of folic acid every day.  If you want to prevent pregnancy, talk to your health care provider about birth control (contraception). Osteoporosis and menopause  Osteoporosis is a disease in which the bones lose minerals and strength with aging. This can result in serious bone fractures. Your risk for osteoporosis can be identified using a bone density scan.  If you are 37 years of age or older, or if you are at risk for osteoporosis and fractures, ask your health care provider if you should be screened.  Ask your health care provider whether you should take a calcium or vitamin D supplement to lower your risk for osteoporosis.  Menopause may have certain physical symptoms and risks.  Hormone replacement therapy may reduce some of these symptoms and risks. Talk to your health care provider about whether hormone replacement therapy is right for you. Follow these instructions at home:  Schedule regular health, dental, and eye exams.  Stay current with your immunizations.  Do not use any tobacco products including cigarettes, chewing tobacco, or electronic cigarettes.  If you are pregnant, do not drink alcohol.  If you are breastfeeding, limit how much and how often you drink alcohol.  Limit alcohol intake to no more than 1 drink per day for  nonpregnant women. One drink equals 12 ounces of beer, 5 ounces of wine, or 1 ounces of hard liquor.  Do not use street drugs.  Do not share needles.  Ask your health care provider for help if you need support or information about quitting drugs.  Tell your health care provider if you often feel depressed.  Tell your health care provider if you have ever been abused or do not feel safe at home. This information is not intended to replace advice given to you by your health care provider. Make sure you discuss any questions you have with your health care provider. Document Released: 06/14/2011 Document Revised: 05/06/2016 Document Reviewed: 09/02/2015 Elsevier Interactive Patient Education  2018 [place].   Hg a1c is 5.7  Very good

## 2018-02-28 ENCOUNTER — Encounter: Payer: Self-pay | Admitting: Internal Medicine

## 2018-02-28 DIAGNOSIS — J0140 Acute pansinusitis, unspecified: Secondary | ICD-10-CM | POA: Diagnosis not present

## 2018-02-28 DIAGNOSIS — B379 Candidiasis, unspecified: Secondary | ICD-10-CM | POA: Diagnosis not present

## 2018-02-28 DIAGNOSIS — T3695XA Adverse effect of unspecified systemic antibiotic, initial encounter: Secondary | ICD-10-CM | POA: Diagnosis not present

## 2018-02-28 NOTE — Telephone Encounter (Signed)
Please advise Dr Panosh, thanks.   

## 2018-02-28 NOTE — Telephone Encounter (Signed)
Could be a sinus infection   Would use some afrin nose spray and saline   for 1-2 days  To see if relieves the pressure and  Not helping get better we may need to add antibiotic

## 2018-03-02 DIAGNOSIS — G4733 Obstructive sleep apnea (adult) (pediatric): Secondary | ICD-10-CM | POA: Diagnosis not present

## 2018-03-03 DIAGNOSIS — J0100 Acute maxillary sinusitis, unspecified: Secondary | ICD-10-CM | POA: Diagnosis not present

## 2018-03-23 ENCOUNTER — Encounter: Payer: Self-pay | Admitting: Internal Medicine

## 2018-03-24 MED ORDER — METFORMIN HCL ER 500 MG PO TB24: ORAL_TABLET | ORAL | 1 refills | Status: DC

## 2018-03-30 ENCOUNTER — Telehealth: Payer: Self-pay | Admitting: Internal Medicine

## 2018-03-30 NOTE — Telephone Encounter (Unsigned)
Copied from [place] [id number]. Topic: Quick Communication - See Telephone Encounter >> Mar 30, 2018  2:38 PM Boyd Kerbs wrote: CRM for notification.  Santiago Glad from [facility] on [address] said they did not receive the prescription metFORMIN (GLUCOPHAGE-XR) 500 MG 24 hr tablet Asking to please re-send  See Telephone encounter for: 03/30/18.

## 2018-03-30 NOTE — Telephone Encounter (Signed)
Med has been filled and is at the [facility] on [address]. Left message for the pt if she wanted med sent to [facility] to call the [facility] she wants med to be sent.

## 2018-04-10 ENCOUNTER — Other Ambulatory Visit: Payer: Self-pay | Admitting: Internal Medicine

## 2018-04-10 DIAGNOSIS — Z1231 Encounter for screening mammogram for malignant neoplasm of breast: Secondary | ICD-10-CM

## 2018-05-24 ENCOUNTER — Ambulatory Visit
Admission: RE | Admit: 2018-05-24 | Discharge: 2018-05-24 | Disposition: A | Discharge status: Home / Self Care | Payer: BLUE CROSS/BLUE SHIELD | Source: Ambulatory Visit | Attending: Internal Medicine | Admitting: Internal Medicine

## 2018-05-24 DIAGNOSIS — Z1231 Encounter for screening mammogram for malignant neoplasm of breast: Secondary | ICD-10-CM

## 2018-05-30 ENCOUNTER — Encounter: Payer: Self-pay | Admitting: Internal Medicine

## 2018-05-30 ENCOUNTER — Ambulatory Visit: Payer: BLUE CROSS/BLUE SHIELD | Admitting: Internal Medicine

## 2018-05-30 VITALS — BP 133/76 | HR 92 | Temp 98.7°F | Wt 176.0 lb

## 2018-05-30 DIAGNOSIS — L559 Sunburn, unspecified: Secondary | ICD-10-CM

## 2018-05-30 DIAGNOSIS — L578 Other skin changes due to chronic exposure to nonionizing radiation: Secondary | ICD-10-CM

## 2018-05-30 MED ORDER — DESONIDE 0.05 % EX CREA: TOPICAL_CREAM | Freq: Two times a day (BID) | CUTANEOUS | 0 refills | Status: DC

## 2018-05-30 NOTE — Progress Notes (Signed)
Chief Complaint  Patient presents with  . Sunburn    HPI: Gail [name] 52 y.o.  sda   Sat pre  [facility] day and althogh sunblock   Was in  [place]  Itches vry bad and taking benadryl     . Scratching   hsa 4 prepe treid very itchy  lidain and  Topical antihistamine and calacryl type med  No pain a now no bliosteres  Now   But just itchyy.  Pix of other burns  ROS: See pertinent positives and negatives per HPI.  Past Medical History:  Diagnosis Date  . Allergy    History of shots as a child  . Anal fissure   . Arthritis   . History of infertility, female   . Hyperlipidemia   . Hypertension   . IBS (irritable bowel syndrome)   . Microscopic hematuria 2017   urology evaluated no sig cause    . PCO (polycystic ovaries)   . Rosacea     Family History  Problem Relation Age of Onset  . Hypertension Mother   . Heart disease Mother   . Hypertension Father   . ALS Father        deceased  . Dementia Maternal Grandfather   . Heart disease Paternal Grandmother   . Dementia Paternal Grandfather   . Hypertension Unknown   . Lung cancer Unknown        uncle  . Breast cancer Unknown        great aunt    Social History   Socioeconomic History  . Marital status: Married    Spouse name: Not on file  . Number of children: 0  . Years of education: Not on file  . Highest education level: Not on file  Occupational History  . Occupation: INFANT Product manager: [employer]  Social Needs  . Financial resource strain: Not on file  . Food insecurity:    Worry: Not on file    Inability: Not on file  . Transportation needs:    Medical: Not on file    Non-medical: Not on file  Tobacco Use  . Smoking status: Former Smoker    Packs/day: 0.50    Years: 7.00    Pack years: 3.50    Types: Cigarettes    Last attempt to quit: 12/13/1986    Years since quitting: 31.4  . Smokeless tobacco: Never Used  Substance and Sexual Activity  . Alcohol use: No   Alcohol/week: 0.0 oz  . Drug use: No  . Sexual activity: Not on file    Comment: married  Lifestyle  . Physical activity:    Days per week: Not on file    Minutes per session: Not on file  . Stress: Not on file  Relationships  . Social connections:    Talks on phone: Not on file    Gets together: Not on file    Attends religious service: Not on file    Active member of club or organization: Not on file    Attends meetings of clubs or organizations: Not on file    Relationship status: Not on file  Other Topics Concern  . 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    Outpatient Medications Prior to Visit  Medication Sig Dispense Refill  . Albuterol Sulfate (PROAIR RESPICLICK) 229 (90 Base) MCG/ACT AEPB Inhale 2 puffs into the lungs every 4 (four) hours as needed. 1 each 0  . Cyanocobalamin (B-12) 1000 MCG SUBL Place under the tongue.    . diltiazem (DILT-XR) 240 MG 24 hr capsule TAKE 1 CAPSULE BY MOUTH DAILY. 90 capsule 3  . fluticasone (FLONASE) 50 MCG/ACT nasal spray 2 sprays per nostril daily.  MUST HAVE OFFICE VISIT FOR FURTHER REFILLS. 16 g 0  . Lactobacillus (ACIDOPHILUS) 10 MG CAPS Take by mouth daily.     Marland Kitchen levonorgestrel (MIRENA) 20 MCG/24HR IUD 1 each by Intrauterine route once.      Marland Kitchen losartan (COZAAR) 50 MG tablet TAKE 1 TABLET(50 MG) BY MOUTH DAILY 90 tablet 3  . metFORMIN (GLUCOPHAGE-XR) 500 MG 24 hr tablet Take 2 po qd as directed 180 tablet 1  . nystatin ointment (MYCOSTATIN) Apply topically as directed 30 g 3  . RESTASIS 0.05 % ophthalmic emulsion     . Zinc Oxide 30.6 % CREA Apply topically as needed.     Marland Kitchen azithromycin (ZITHROMAX) 250 MG tablet Take 2 tabs day 1, then 1 tab daily (Patient not taking: Reported on 05/30/2018) 6 each 0  . HYDROcodone-homatropine (HYCODAN) 5-1.5 MG/5ML syrup  Take 5 mLs by mouth every 8 (eight) hours as needed for cough. (Patient not taking: Reported on 05/30/2018) 120 mL 0  . ipratropium (ATROVENT) 0.06 % nasal spray Place 2 sprays into both nostrils 4 (four) times daily. (Patient not taking: Reported on 05/30/2018) 15 mL 0  . losartan (COZAAR) 50 MG tablet TAKE 1 TABLET(50 MG) BY MOUTH DAILY (Patient not taking: Reported on 05/30/2018) 90 tablet 1  . triamcinolone (KENALOG) 0.025 % cream Apply 1 application topically 2 (two) times daily.     No facility-administered medications prior to visit.      EXAM:  BP 133/76   Pulse 92   Temp 98.7 F (37.1 C)   Wt 176 lb (79.8 kg)   BMI 34.37 kg/m   Body mass index is 34.37 kg/m.  GENERAL: vitals reviewed and listed above, alert, oriented, appears well hydrated and in no acute distress HEENT: atraumatic, conjunctiva  clear, no obvious abnormalities on inspection of external nose and ears Skin  Band on pink smooth aarea on abdomen without lesion infection    Otherwise fased erythema on upper legs PSYCH: pleasant and cooperative, no obvious depression or anxiety  ASSESSMENT AND PLAN:  Discussed the following assessment and plan:  Sunburn  Dermatitis due to sun and topicals  - sun burn  and poss contact irritation from preps using for burn  stop and use steroid topical and moiturize plain can dis with derm  -Patient advised to return or notify health care team  if symptoms worsen ,persist or new concerns arise.  Patient Instructions  Stop the current.  Topicals could be sensitive to these  At this tim.   Use steroid topical and cover up no sun.  No  Infection   Can use zinc oxide int the short run.    Moisturizer that you are not sensitive to.  Ask derm  As needed .      Standley Brooking. Meadow Abramo M.D.

## 2018-05-30 NOTE — Patient Instructions (Addendum)
Stop the current.  Topicals could be sensitive to these  At this tim.   Use steroid topical and cover up no sun.  No  Infection   Can use zinc oxide int the short run.    Moisturizer that you are not sensitive to.  Ask derm  As needed .

## 2018-06-05 DIAGNOSIS — L821 Other seborrheic keratosis: Secondary | ICD-10-CM | POA: Diagnosis not present

## 2018-06-05 DIAGNOSIS — D225 Melanocytic nevi of trunk: Secondary | ICD-10-CM | POA: Diagnosis not present

## 2018-06-05 DIAGNOSIS — L814 Other melanin hyperpigmentation: Secondary | ICD-10-CM | POA: Diagnosis not present

## 2018-06-05 DIAGNOSIS — D1801 Hemangioma of skin and subcutaneous tissue: Secondary | ICD-10-CM | POA: Diagnosis not present

## 2018-08-05 ENCOUNTER — Other Ambulatory Visit: Payer: Self-pay | Admitting: Internal Medicine

## 2018-08-09 ENCOUNTER — Ambulatory Visit: Payer: BLUE CROSS/BLUE SHIELD

## 2018-08-30 ENCOUNTER — Telehealth: Payer: Self-pay | Admitting: Acute Care

## 2018-08-30 NOTE — Telephone Encounter (Signed)
Spoke briefly with the pt  She states she uses [facility] for her CPAP supplies, but they have gotten too expensive and so she wants to switch to [facility]  I advised that she needs ov to est care with a sleep doc  Once we get this scheduled will send order  She did not have time to schedule appt, and states would call back to schedule this, and also discuss a letter that she will be needing  Will await her call back

## 2018-08-30 NOTE — Telephone Encounter (Signed)
ATC- someone picked up but they were unable to hear me x 2, [facility] tomorrow

## 2018-08-30 NOTE — Telephone Encounter (Signed)
Pt is calling back [phone]

## 2018-08-31 NOTE — Telephone Encounter (Signed)
Voicemail full on mobile. And home phone not set up. Will attempt to call back. Patient needs a consult appointment with a Sleep Doctor.

## 2018-08-31 NOTE — Telephone Encounter (Signed)
Called patient unable to reach left message to give us a call back.

## 2018-09-01 NOTE — Telephone Encounter (Signed)
Spoke with patient, appointment made for 09/13/2018 at 3:15pm. Nothing further needed at this time.

## 2018-09-05 NOTE — Progress Notes (Signed)
Chief Complaint  Patient presents with  . Follow-up    6 months. No new concerns. Pt is getting rady to travel to [place]    HPI: Gail [name] 52 y.o. come in for Chronic disease management  6 mos Med check  Ht   bp  Madaline Brilliant   To get special  Ed  Program.   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 Mom Getting married in [place]   May need anxiety med to fly   .  ROS: See pertinent positives and negatives per HPI. No cp sob ocass palpitation when anxious or caffiene no recnet cough  To get  Flu vaccine at work  Or later   Past Medical History:  Diagnosis Date  . Allergy    History of shots as a child  . Anal fissure   . Arthritis   . History of infertility, female   . Hyperlipidemia   . Hypertension   . IBS (irritable bowel syndrome)   . Microscopic hematuria 2017   urology evaluated no sig cause    . PCO (polycystic ovaries)   . Rosacea     Family History  Problem Relation Age of Onset  . Hypertension Mother   . Heart disease Mother   . Hypertension Father   . ALS Father        deceased  . Dementia Maternal Grandfather   . Heart disease Paternal Grandmother   . Dementia Paternal Grandfather   . Hypertension Unknown   . Lung cancer Unknown        uncle  . Breast cancer Unknown        great aunt    Social History   Socioeconomic History  . Marital status: Married    Spouse name: Not on file  . Number of children: 0  . Years of education: Not on file  . Highest education level: Not on file  Occupational History  . Occupation: INFANT Product manager: [employer]  Social Needs  . Financial resource strain: Not on file  . Food insecurity:    Worry: Not on file    Inability: Not on file  . Transportation needs:    Medical: Not on file    Non-medical: Not on file  Tobacco Use  . Smoking status: Former Smoker    Packs/day: 0.50    Years: 7.00    Pack years: 3.50    Types: Cigarettes   Last attempt to quit: 12/13/1986    Years since quitting: 31.7  . Smokeless tobacco: Never Used  Substance and Sexual Activity  . Alcohol use: No    Alcohol/week: 0.0 standard drinks  . Drug use: No  . Sexual activity: Not on file    Comment: married  Lifestyle  . Physical activity:    Days per week: Not on file    Minutes per session: Not on file  . Stress: Not on file  Relationships  . Social connections:    Talks on phone: Not on file    Gets together: Not on file    Attends religious service: Not on file    Active member of club or organization: Not on file    Attends meetings of clubs or organizations: Not on file    Relationship status: Not on file  Other Topics Concern  . 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    Outpatient Medications Prior to Visit  Medication Sig Dispense Refill  . Albuterol Sulfate (PROAIR RESPICLICK) 585 (90 Base) MCG/ACT AEPB Inhale 2 puffs into the lungs every 4 (four) hours as needed. 1 each 0  . Cyanocobalamin (B-12) 1000 MCG SUBL Place under the tongue.    Marland Kitchen desonide (DESOWEN) 0.05 % cream Apply topically 2 (two) times daily. 30 g 0  . diltiazem (DILT-XR) 240 MG 24 hr capsule TAKE 1 CAPSULE BY MOUTH DAILY. 90 capsule 3  . fluticasone (FLONASE) 50 MCG/ACT nasal spray 2 sprays per nostril daily.  MUST HAVE OFFICE VISIT FOR FURTHER REFILLS. 16 g 0  . Lactobacillus (ACIDOPHILUS) 10 MG CAPS Take by mouth daily.     Marland Kitchen levonorgestrel (MIRENA) 20 MCG/24HR IUD 1 each by Intrauterine route once.      Marland Kitchen losartan (COZAAR) 50 MG tablet TAKE 1 TABLET(50 MG) BY MOUTH DAILY 90 tablet 3  . losartan (COZAAR) 50 MG tablet TAKE 1 TABLET(50 MG) BY MOUTH DAILY 90 tablet 0  . metFORMIN (GLUCOPHAGE-XR) 500 MG 24 hr tablet Take 2 po qd as directed 180 tablet 1  . nystatin ointment  (MYCOSTATIN) Apply topically as directed 30 g 3  . RESTASIS 0.05 % ophthalmic emulsion     . Zinc Oxide 30.6 % CREA Apply topically as needed.      No facility-administered medications prior to visit.      EXAM:  BP 118/78 (BP Location: Right Arm, Patient Position: Sitting, Cuff Size: Normal)   Pulse 90   Temp (!) 97.5 F (36.4 C) (Oral)   Wt 181 lb 1.6 oz (82.1 kg)   BMI 35.37 kg/m   Body mass index is 35.37 kg/m.  GENERAL: vitals reviewed and listed above, alert, oriented, appears well hydrated and in no acute distress HEENT: atraumatic, conjunctiva  clear, no obvious abnormalities on inspection of external nose and ears OP : no lesion edema or exudate  NECK: no obvious masses on inspection palpation  LUNGS: clear to auscultation bilaterally, no wheezes, rales or rhonchi, good air movement CV: HRRR, no clubbing cyanosis or  peripheral edema nl cap refill   2/6 short sem lusb ? If radiates  Varies by positions  Abdomen:  Sof,t normal bowel sounds without hepatosplenomegaly, no guarding rebound or masses no CVA tenderness MS: moves all extremities without noticeable focal  abnormality PSYCH: pleasant and cooperative, no obvious depression or anxiety  BP Readings from Last 3 Encounters:  09/07/18 118/78  05/30/18 133/76  02/06/18 (!) 142/80    ASSESSMENT AND PLAN:  Discussed the following assessment and plan:  Essential hypertension - Plan: Basic metabolic panel, CBC with Differential/Platelet, Hemoglobin A1c, Hepatic function panel, Lipid panel, TSH  Medication management - ativan rx  for flight travel as needed  - Plan: Basic metabolic panel, CBC with Differential/Platelet, Hemoglobin A1c, Hepatic function panel, Lipid panel, TSH  Hyperlipidemia, unspecified hyperlipidemia type - Plan: Basic metabolic panel, CBC with Differential/Platelet, Hemoglobin A1c, Hepatic function panel, Lipid panel, TSH  Fasting hyperglycemia - Plan: Basic metabolic panel, CBC with  Differential/Platelet, Hemoglobin A1c, Hepatic function panel, Lipid panel, TSH  Polycystic ovaries - Plan: Basic metabolic panel, CBC with Differential/Platelet, Hemoglobin A1c, Hepatic function panel, Lipid panel, TSH  PALPITATIONS, RECURRENT  OSA (obstructive  sleep apnea)  Heart murmur - n osx  last echo 2012 and no valve issues normal  follow and consdier repeat echo at next check or if any sx  Due for labs  Get appt for fasting labs   And then 6 mos cpx etc   -Patient advised to return or notify health care team  if  new concerns arise.  Patient Instructions  Get  Fasting lab monitoring .  When you can with lab appt.   Orders are in   cpx in 6 months or as needed  Get your flu shot   Glad you are doing well.   Standley Brooking. Panosh M.D.

## 2018-09-06 ENCOUNTER — Ambulatory Visit: Payer: BLUE CROSS/BLUE SHIELD | Admitting: Internal Medicine

## 2018-09-07 ENCOUNTER — Encounter: Payer: Self-pay | Admitting: Internal Medicine

## 2018-09-07 ENCOUNTER — Ambulatory Visit: Payer: BLUE CROSS/BLUE SHIELD | Admitting: Internal Medicine

## 2018-09-07 VITALS — BP 118/78 | HR 90 | Temp 97.5°F | Wt 181.1 lb

## 2018-09-07 DIAGNOSIS — G4733 Obstructive sleep apnea (adult) (pediatric): Secondary | ICD-10-CM

## 2018-09-07 DIAGNOSIS — Z79899 Other long term (current) drug therapy: Secondary | ICD-10-CM | POA: Diagnosis not present

## 2018-09-07 DIAGNOSIS — E282 Polycystic ovarian syndrome: Secondary | ICD-10-CM

## 2018-09-07 DIAGNOSIS — I10 Essential (primary) hypertension: Secondary | ICD-10-CM | POA: Diagnosis not present

## 2018-09-07 DIAGNOSIS — E785 Hyperlipidemia, unspecified: Secondary | ICD-10-CM

## 2018-09-07 DIAGNOSIS — R002 Palpitations: Secondary | ICD-10-CM

## 2018-09-07 DIAGNOSIS — R7301 Impaired fasting glucose: Secondary | ICD-10-CM | POA: Diagnosis not present

## 2018-09-07 DIAGNOSIS — R011 Cardiac murmur, unspecified: Secondary | ICD-10-CM

## 2018-09-07 MED ORDER — LORAZEPAM 0.5 MG PO TABS: 0.5000 mg | ORAL_TABLET | Freq: Two times a day (BID) | ORAL | 0 refills | Status: DC | PRN

## 2018-09-07 NOTE — Patient Instructions (Addendum)
Get  Fasting lab monitoring .  When you can with lab appt.   Orders are in   cpx in 6 months or as needed  Get your flu shot   Glad you are doing well.

## 2018-09-13 ENCOUNTER — Encounter: Payer: Self-pay | Admitting: Pulmonary Disease

## 2018-09-13 ENCOUNTER — Ambulatory Visit: Payer: BLUE CROSS/BLUE SHIELD | Admitting: Pulmonary Disease

## 2018-09-13 VITALS — BP 116/82 | HR 78 | Ht 60.0 in | Wt 181.0 lb

## 2018-09-13 DIAGNOSIS — G4733 Obstructive sleep apnea (adult) (pediatric): Secondary | ICD-10-CM

## 2018-09-13 NOTE — Progress Notes (Signed)
Gail [name]    [id number]    1966/06/28  Primary Care Physician:Panosh, Standley Brooking, MD  Referring Physician: Burnis Medin, MD [place], [place] [address]  Chief complaint:   Obstructive sleep apnea, on CPAP therapy  HPI:  Patient currently uses an auto titrating CPAP therapy Pressure setting of 5-15 Download reveals an AHI of 0.8  She does get about 6 to 7 hours of sleep at night Occasionally falls asleep prior to putting her CPAP on Not having any issues with the CPAP, no mask issues  Weight is stable, no memory issues  Occupation: Teacher Exposures: No significant exposures Smoking history: Reformed smoker  Outpatient Encounter Medications as of 09/13/2018  Medication Sig  . Albuterol Sulfate (PROAIR RESPICLICK) 433 (90 Base) MCG/ACT AEPB Inhale 2 puffs into the lungs every 4 (four) hours as needed.  . Cyanocobalamin (B-12) 1000 MCG SUBL Place under the tongue.  Marland Kitchen desonide (DESOWEN) 0.05 % cream Apply topically 2 (two) times daily.  Marland Kitchen diltiazem (DILT-XR) 240 MG 24 hr capsule TAKE 1 CAPSULE BY MOUTH DAILY.  . fluticasone (FLONASE) 50 MCG/ACT nasal spray 2 sprays per nostril daily.  MUST HAVE OFFICE VISIT FOR FURTHER REFILLS.  . Lactobacillus (ACIDOPHILUS) 10 MG CAPS Take by mouth daily.   Marland Kitchen levonorgestrel (MIRENA) 20 MCG/24HR IUD 1 each by Intrauterine route once.    Marland Kitchen LORazepam (ATIVAN) 0.5 MG tablet Take 1 tablet (0.5 mg total) by mouth 2 (two) times daily as needed for anxiety (travel).  Marland Kitchen losartan (COZAAR) 50 MG tablet TAKE 1 TABLET(50 MG) BY MOUTH DAILY  . losartan (COZAAR) 50 MG tablet TAKE 1 TABLET(50 MG) BY MOUTH DAILY  . metFORMIN (GLUCOPHAGE-XR) 500 MG 24 hr tablet Take 2 po qd as directed  . nystatin ointment (MYCOSTATIN) Apply topically as directed  . RESTASIS 0.05 % ophthalmic emulsion   . Zinc Oxide 30.6 % CREA Apply topically as needed.   . [DISCONTINUED] ranitidine (ZANTAC) 300 MG tablet Take 1 tablet (300 mg total) by  mouth 2 (two) times daily.   No facility-administered encounter medications on file as of 09/13/2018.     Allergies as of 09/13/2018 - Review Complete 09/13/2018  Allergen Reaction Noted  . Ace inhibitors Cough 09/19/2013  . Codeine Itching 10/16/2007  . Erythromycin ethylsuccinate  12/30/2006  . Doxycycline Rash 11/18/2012  . Latex Rash 11/12/2011    Past Medical History:  Diagnosis Date  . Allergy    History of shots as a child  . Anal fissure   . Arthritis   . History of infertility, female   . Hyperlipidemia   . Hypertension   . IBS (irritable bowel syndrome)   . Microscopic hematuria 2017   urology evaluated no sig cause    . PCO (polycystic ovaries)   . Rosacea     Past Surgical History:  Procedure Laterality Date  . CARPAL TUNNEL RELEASE     x2 bilateral  . KNEE ARTHROSCOPY     left   . WISDOM TOOTH EXTRACTION    . wrist tendon release     bilateral thumbs    Family History  Problem Relation Age of Onset  . Hypertension Mother   . Heart disease Mother   . Hypertension Father   . ALS Father        deceased  . Dementia Maternal Grandfather   . Heart disease Paternal Grandmother   . Dementia Paternal Grandfather   . Hypertension Unknown   .  Lung cancer Unknown        uncle  . Breast cancer Unknown        great aunt    Social History   Socioeconomic History  . Marital status: Married    Spouse name: Not on file  . Number of children: 0  . Years of education: Not on file  . Highest education level: Not on file  Occupational History  . Occupation: INFANT Product manager: [employer]  Social Needs  . Financial resource strain: Not on file  . Food insecurity:    Worry: Not on file    Inability: Not on file  . Transportation needs:    Medical: Not on file    Non-medical: Not on file  Tobacco Use  . Smoking status: Former Smoker    Packs/day: 0.50    Years: 7.00    Pack years: 3.50    Types: Cigarettes    Last attempt to  quit: 12/13/1986    Years since quitting: 31.7  . Smokeless tobacco: Never Used  Substance and Sexual Activity  . Alcohol use: No    Alcohol/week: 0.0 standard drinks  . Drug use: No  . Sexual activity: Not on file    Comment: married  Lifestyle  . Physical activity:    Days per week: Not on file    Minutes per session: Not on file  . Stress: Not on file  Relationships  . Social connections:    Talks on phone: Not on file    Gets together: Not on file    Attends religious service: Not on file    Active member of club or organization: Not on file    Attends meetings of clubs or organizations: Not on file    Relationship status: Not on file  . Intimate partner violence:    Fear of current or ex partner: Not on file    Emotionally abused: Not on file    Physically abused: Not on file    Forced sexual activity: Not on file  Other Topics Concern  . 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    Review of Systems  Constitutional: Negative.   HENT: Negative.   Eyes: Negative.   Respiratory: Negative.   Cardiovascular: Negative.   Genitourinary: Negative.   All other systems reviewed and are negative.   Vitals:   09/13/18 1541  BP: 116/82  Pulse: 78  SpO2: 98%   Physical Exam  Constitutional: She appears well-developed and well-nourished.  HENT:  Head: Normocephalic and atraumatic.  Eyes: Pupils are equal, round, and reactive to light. Conjunctivae and EOM are normal. Right eye exhibits no discharge. Left eye exhibits no discharge.  Neck: Normal range of motion. Neck supple. No thyromegaly present.  Cardiovascular: Normal rate and regular rhythm.  Pulmonary/Chest: Effort normal and breath sounds normal. No respiratory distress. She has no wheezes. She has no rales.  Abdominal: Soft. Bowel  sounds are normal. She exhibits no distension.   Data Reviewed:  Compliance report shows an AHI of 0.8 No significant leaks, 80% compliance  Assessment:  Obstructive sleep apnea is optimally treated at the present time Not having any acute issues Appears to be functioning well  Encouraged regular use of CPAP  Regular exercise to keep weight controlled  Other comorbidities are well controlled at present  Plan/Recommendations:  I will see you on a yearly basis  DME referral for CPAP supplies  Note, to help with traveling with her CPAP device  Encourage optimal hours of sleep   Sherrilyn Rist MD [facility] 09/13/2018, 3:55 PM  CC: Panosh, Standley Brooking, MD

## 2018-09-13 NOTE — Patient Instructions (Signed)
Obstructive sleep apnea  Appears well treated, download with an AHI of 0.8  No changes to plan of care  We will be glad to follow you yearly

## 2018-09-15 ENCOUNTER — Telehealth: Payer: Self-pay | Admitting: Pulmonary Disease

## 2018-09-15 DIAGNOSIS — G4733 Obstructive sleep apnea (adult) (pediatric): Secondary | ICD-10-CM

## 2018-09-15 NOTE — Telephone Encounter (Signed)
Another order was placed for cpap supplies and specified [facility] as pt's dme  Called [facility] and spoke with Barbaraann Rondo to let him know this had been done. Barbaraann Rondo expressed understanding. Nothing further needed.

## 2018-09-20 ENCOUNTER — Telehealth: Payer: Self-pay | Admitting: Pulmonary Disease

## 2018-09-20 ENCOUNTER — Other Ambulatory Visit: Payer: Self-pay | Admitting: Internal Medicine

## 2018-09-20 DIAGNOSIS — G4733 Obstructive sleep apnea (adult) (pediatric): Secondary | ICD-10-CM

## 2018-09-20 NOTE — Telephone Encounter (Addendum)
Called and spoke to pt.  Pt is requesting Rx for cpap machine to be able to fly with cpap.  Pt will be flying 09/26/18. Pt will pick Rx up when ready.   AO please advise. Thanks

## 2018-09-20 NOTE — Telephone Encounter (Signed)
Pt is returning call. Cb is [phone].

## 2018-09-20 NOTE — Telephone Encounter (Signed)
lmtcb x1 for pt. 

## 2018-09-21 ENCOUNTER — Encounter: Payer: Self-pay | Admitting: Pulmonary Disease

## 2018-09-21 NOTE — Progress Notes (Addendum)
Patient name: Gail [name] Date: 09/21/2018 Diagnosis: Obstructive sleep apnea  My patient has been diagnosed with obstructive sleep apnea requiring CPAP therapy with heated humidification to permit adequate compliance to the necessary medical therapy    Adewale Olalere. MD Tel: [phone]  Letter of medical necessity printed out for patient

## 2018-09-21 NOTE — Telephone Encounter (Signed)
Letter of medical necessity available for patient to pick up  Please call patient to make sure nothing else is needed

## 2018-09-21 NOTE — Telephone Encounter (Signed)
Called spoke with patient she is looking for the RX not a letter.   RX given to DR. Olalere, signed, and put in an envelope up front for pick up with patients name and DOB.  Letter also inserted into the envelope.  Called patient back to let patient know ready for pick up patient states she will pick it up tomorrow afternoon.    Nothing further needed.

## 2018-09-25 ENCOUNTER — Telehealth: Payer: Self-pay | Admitting: Internal Medicine

## 2018-09-25 NOTE — Telephone Encounter (Signed)
Copied from [place] [id number]. Topic: Quick Communication - Rx Refill/Question >> Sep 25, 2018  2:48 PM Waylan Rocher, Lumin L wrote: Medication: nystatin ointment (MYCOSTATIN) (used daily for yeast infection, none left, out if town tomorrow evening and will be I n07:45am for labs tomorrow)  Has the patient contacted their pharmacy? Yes.   (Agent: If no, request that the patient contact the pharmacy for the refill.) (Agent: If yes, when and what did the pharmacy advise?)  Preferred Pharmacy (with phone number or street name): [facility] [place], [place] - [address] [place] [address] [place] [address] Phone: [phone] Fax: [phone]  Agent: Please be advised that RX refills may take up to 3 business days. We ask that you follow-up with your pharmacy.

## 2018-09-26 ENCOUNTER — Other Ambulatory Visit: Payer: Self-pay | Admitting: Internal Medicine

## 2018-09-26 ENCOUNTER — Other Ambulatory Visit (INDEPENDENT_AMBULATORY_CARE_PROVIDER_SITE_OTHER): Payer: BLUE CROSS/BLUE SHIELD

## 2018-09-26 DIAGNOSIS — E282 Polycystic ovarian syndrome: Secondary | ICD-10-CM

## 2018-09-26 DIAGNOSIS — E785 Hyperlipidemia, unspecified: Secondary | ICD-10-CM | POA: Diagnosis not present

## 2018-09-26 DIAGNOSIS — R7301 Impaired fasting glucose: Secondary | ICD-10-CM | POA: Diagnosis not present

## 2018-09-26 DIAGNOSIS — I10 Essential (primary) hypertension: Secondary | ICD-10-CM

## 2018-09-26 DIAGNOSIS — Z79899 Other long term (current) drug therapy: Secondary | ICD-10-CM

## 2018-09-26 LAB — CBC WITH DIFFERENTIAL/PLATELET
BASOS ABS: 0 10*3/uL (ref 0.0–0.1)
BASOS PCT: 0.8 % (ref 0.0–3.0)
EOS ABS: 0.1 10*3/uL (ref 0.0–0.7)
Eosinophils Relative: 2.4 % (ref 0.0–5.0)
HEMATOCRIT: 40.7 % (ref 36.0–46.0)
HEMOGLOBIN: 13.7 g/dL (ref 12.0–15.0)
LYMPHS PCT: 25 % (ref 12.0–46.0)
Lymphs Abs: 1.4 10*3/uL (ref 0.7–4.0)
MCHC: 33.5 g/dL (ref 30.0–36.0)
MCV: 94.8 fl (ref 78.0–100.0)
Monocytes Absolute: 0.6 10*3/uL (ref 0.1–1.0)
Monocytes Relative: 9.8 % (ref 3.0–12.0)
Neutro Abs: 3.5 10*3/uL (ref 1.4–7.7)
Neutrophils Relative %: 62 % (ref 43.0–77.0)
Platelets: 238 10*3/uL (ref 150.0–400.0)
RBC: 4.3 Mil/uL (ref 3.87–5.11)
RDW: 13 % (ref 11.5–15.5)
WBC: 5.7 10*3/uL (ref 4.0–10.5)

## 2018-09-26 LAB — LIPID PANEL
CHOL/HDL RATIO: 4
Cholesterol: 191 mg/dL (ref 0–200)
HDL: 51.6 mg/dL (ref >39.00)
LDL CALC: 124 mg/dL — AB (ref 0–99)
NONHDL: 139.1
TRIGLYCERIDES: 74 mg/dL (ref 0.0–149.0)
VLDL: 14.8 mg/dL (ref 0.0–40.0)

## 2018-09-26 LAB — BASIC METABOLIC PANEL
BUN: 16 mg/dL (ref 6–23)
CHLORIDE: 104 meq/L (ref 96–112)
CO2: 27 meq/L (ref 19–32)
Calcium: 9.4 mg/dL (ref 8.4–10.5)
Creatinine, Ser: 0.71 mg/dL (ref 0.40–1.20)
GFR: 92.59 mL/min (ref >60.00)
GLUCOSE: 97 mg/dL (ref 70–99)
POTASSIUM: 4 meq/L (ref 3.5–5.1)
SODIUM: 139 meq/L (ref 135–145)

## 2018-09-26 LAB — HEMOGLOBIN A1C: HEMOGLOBIN A1C: 5.6 % (ref 4.6–6.5)

## 2018-09-26 LAB — HEPATIC FUNCTION PANEL
ALT: 11 U/L (ref 0–35)
AST: 12 U/L (ref 0–37)
Albumin: 4.5 g/dL (ref 3.5–5.2)
Alkaline Phosphatase: 73 U/L (ref 39–117)
BILIRUBIN DIRECT: 0.1 mg/dL (ref 0.0–0.3)
BILIRUBIN TOTAL: 0.6 mg/dL (ref 0.2–1.2)
TOTAL PROTEIN: 7 g/dL (ref 6.0–8.3)

## 2018-09-26 LAB — TSH: TSH: 2.22 u[IU]/mL (ref 0.35–4.50)

## 2018-09-26 MED ORDER — NYSTATIN 100000 UNIT/GM EX OINT: TOPICAL_OINTMENT | CUTANEOUS | 0 refills | Status: DC

## 2018-09-26 NOTE — Telephone Encounter (Signed)
Last prescribed 02/06/18 Pt states she has a yeast infection and is needing a refill of her Mycostatin topical.   Please advise Tommi Rumps if able to refill this medication as requested.

## 2018-09-26 NOTE — Telephone Encounter (Signed)
Sent in refill

## 2018-09-27 NOTE — Telephone Encounter (Signed)
ATC fast busy signal Will try again at later time.

## 2018-09-28 NOTE — Telephone Encounter (Signed)
mychart message sent.  Unable to get in touch with patient via telephone

## 2018-10-10 ENCOUNTER — Telehealth: Payer: Self-pay

## 2018-10-10 NOTE — Telephone Encounter (Signed)
Copied from [place] [id number]. Topic: General - Other >> Oct 10, 2018  4:23 PM Carolyn Stare wrote:  Pt call to ask if Dr Regis Bill will call her in Hydrocodone for a hacking cough  Pharmacy [facility]    [facility] to review symptoms

## 2018-10-10 NOTE — Telephone Encounter (Signed)
Patient states that she has had a dry, non productive, hacking cough for about 2 days.  There is nasal drainage with some wheezing.  Her cough increases at night which makes it difficult to sleep.  She has tried Delsym and other over the counter cough medication with no improvement.  She would like a Rx for Hydrocodone cough syrup if possible.  Please advise.  [facility]

## 2018-10-13 MED ORDER — HYDROCODONE-HOMATROPINE 5-1.5 MG/5ML PO SYRP: 5.0000 mL | ORAL_SOLUTION | Freq: Three times a day (TID) | ORAL | 0 refills | Status: DC | PRN

## 2018-10-13 NOTE — Telephone Encounter (Signed)
Pt aware Rx sent in Nothing further needed.

## 2018-10-13 NOTE — Telephone Encounter (Signed)
Sent in electronically .  

## 2018-10-16 ENCOUNTER — Emergency Department (HOSPITAL_BASED_OUTPATIENT_CLINIC_OR_DEPARTMENT_OTHER): Payer: BLUE CROSS/BLUE SHIELD

## 2018-10-16 ENCOUNTER — Other Ambulatory Visit: Payer: Self-pay

## 2018-10-16 ENCOUNTER — Emergency Department (HOSPITAL_BASED_OUTPATIENT_CLINIC_OR_DEPARTMENT_OTHER)
Admission: EM | Admit: 2018-10-16 | Discharge: 2018-10-16 | Disposition: A | Discharge status: Home / Self Care | Payer: BLUE CROSS/BLUE SHIELD | Attending: Emergency Medicine | Admitting: Emergency Medicine

## 2018-10-16 ENCOUNTER — Encounter (HOSPITAL_BASED_OUTPATIENT_CLINIC_OR_DEPARTMENT_OTHER): Payer: Self-pay | Admitting: Emergency Medicine

## 2018-10-16 DIAGNOSIS — N23 Unspecified renal colic: Secondary | ICD-10-CM | POA: Insufficient documentation

## 2018-10-16 DIAGNOSIS — J45909 Unspecified asthma, uncomplicated: Secondary | ICD-10-CM | POA: Diagnosis not present

## 2018-10-16 DIAGNOSIS — F419 Anxiety disorder, unspecified: Secondary | ICD-10-CM | POA: Insufficient documentation

## 2018-10-16 DIAGNOSIS — Z87891 Personal history of nicotine dependence: Secondary | ICD-10-CM | POA: Insufficient documentation

## 2018-10-16 DIAGNOSIS — Z7984 Long term (current) use of oral hypoglycemic drugs: Secondary | ICD-10-CM | POA: Insufficient documentation

## 2018-10-16 DIAGNOSIS — I10 Essential (primary) hypertension: Secondary | ICD-10-CM | POA: Insufficient documentation

## 2018-10-16 DIAGNOSIS — Z79899 Other long term (current) drug therapy: Secondary | ICD-10-CM | POA: Diagnosis not present

## 2018-10-16 DIAGNOSIS — Z91040 Latex allergy status: Secondary | ICD-10-CM | POA: Diagnosis not present

## 2018-10-16 DIAGNOSIS — N132 Hydronephrosis with renal and ureteral calculous obstruction: Secondary | ICD-10-CM | POA: Diagnosis not present

## 2018-10-16 DIAGNOSIS — R1032 Left lower quadrant pain: Secondary | ICD-10-CM | POA: Diagnosis present

## 2018-10-16 LAB — BASIC METABOLIC PANEL
ANION GAP: 11 (ref 5–15)
BUN: 16 mg/dL (ref 6–20)
CHLORIDE: 103 mmol/L (ref 98–111)
CO2: 23 mmol/L (ref 22–32)
Calcium: 9 mg/dL (ref 8.9–10.3)
Creatinine, Ser: 1.04 mg/dL — ABNORMAL HIGH (ref 0.44–1.00)
GFR calc Af Amer: >60 mL/min (ref >60)
GLUCOSE: 148 mg/dL — AB (ref 70–99)
POTASSIUM: 3.7 mmol/L (ref 3.5–5.1)
Sodium: 137 mmol/L (ref 135–145)

## 2018-10-16 LAB — URINALYSIS, ROUTINE W REFLEX MICROSCOPIC
Bilirubin Urine: NEGATIVE
GLUCOSE, UA: NEGATIVE mg/dL
Ketones, ur: 15 mg/dL — AB
LEUKOCYTES UA: NEGATIVE
Nitrite: NEGATIVE
Protein, ur: 30 mg/dL — AB
Specific Gravity, Urine: >1.03 — ABNORMAL HIGH (ref 1.005–1.030)
pH: 5.5 (ref 5.0–8.0)

## 2018-10-16 LAB — CBC WITH DIFFERENTIAL/PLATELET
Abs Immature Granulocytes: 0.04 10*3/uL (ref 0.00–0.07)
BASOS PCT: 0 %
Basophils Absolute: 0 10*3/uL (ref 0.0–0.1)
EOS ABS: 0.1 10*3/uL (ref 0.0–0.5)
EOS PCT: 1 %
HEMATOCRIT: 46.1 % — AB (ref 36.0–46.0)
Hemoglobin: 14.7 g/dL (ref 12.0–15.0)
Immature Granulocytes: 0 %
LYMPHS ABS: 1.1 10*3/uL (ref 0.7–4.0)
Lymphocytes Relative: 11 %
MCH: 30.9 pg (ref 26.0–34.0)
MCHC: 31.9 g/dL (ref 30.0–36.0)
MCV: 96.8 fL (ref 80.0–100.0)
MONOS PCT: 6 %
Monocytes Absolute: 0.6 10*3/uL (ref 0.1–1.0)
NRBC: 0 % (ref 0.0–0.2)
Neutro Abs: 8.1 10*3/uL — ABNORMAL HIGH (ref 1.7–7.7)
Neutrophils Relative %: 82 %
Platelets: 239 10*3/uL (ref 150–400)
RBC: 4.76 MIL/uL (ref 3.87–5.11)
RDW: 12.3 % (ref 11.5–15.5)
WBC: 10.1 10*3/uL (ref 4.0–10.5)

## 2018-10-16 LAB — URINALYSIS, MICROSCOPIC (REFLEX)

## 2018-10-16 MED ORDER — ONDANSETRON 4 MG PO TBDP
4.0000 mg | ORAL_TABLET | Freq: Once | ORAL | Status: AC
  Administered 2018-10-16: 4 mg via ORAL
  Filled 2018-10-16: qty 1

## 2018-10-16 MED ORDER — ACETAMINOPHEN 325 MG PO TABS
650.0000 mg | ORAL_TABLET | Freq: Once | ORAL | Status: AC
  Administered 2018-10-16: 650 mg via ORAL
  Filled 2018-10-16: qty 2

## 2018-10-16 MED ORDER — KETOROLAC TROMETHAMINE 30 MG/ML IJ SOLN
15.0000 mg | Freq: Once | INTRAMUSCULAR | Status: AC
  Administered 2018-10-16: 15 mg via INTRAVENOUS
  Filled 2018-10-16: qty 1

## 2018-10-16 MED ORDER — SODIUM CHLORIDE 0.9 % IV BOLUS
500.0000 mL | Freq: Once | INTRAVENOUS | Status: AC
  Administered 2018-10-16: 500 mL via INTRAVENOUS

## 2018-10-16 MED ORDER — HYDROCODONE-ACETAMINOPHEN 5-325 MG PO TABS: 1.0000 | ORAL_TABLET | Freq: Three times a day (TID) | ORAL | 0 refills | Status: DC | PRN

## 2018-10-16 MED ORDER — IBUPROFEN 600 MG PO TABS: 600.0000 mg | ORAL_TABLET | Freq: Four times a day (QID) | ORAL | 0 refills | Status: DC | PRN

## 2018-10-16 MED ORDER — ONDANSETRON HCL 4 MG/2ML IJ SOLN
4.0000 mg | Freq: Once | INTRAMUSCULAR | Status: AC
  Administered 2018-10-16: 4 mg via INTRAVENOUS
  Filled 2018-10-16: qty 2

## 2018-10-16 MED ORDER — HYDROCODONE-ACETAMINOPHEN 5-325 MG PO TABS: 1.0000 | ORAL_TABLET | Freq: Three times a day (TID) | ORAL | 0 refills | Status: AC | PRN

## 2018-10-16 MED ORDER — ONDANSETRON 8 MG PO TBDP: 8.0000 mg | ORAL_TABLET | Freq: Three times a day (TID) | ORAL | 0 refills | Status: DC | PRN

## 2018-10-16 NOTE — Discharge Instructions (Signed)
We saw you in the ER for the abdominal pain. °Our results indicate that you have a kidney stone. °We were able to get your pain is relative control, and we can safely send you home. ° °Take the meds prescribed. °Set up an appointment with the Urologist. °If the pain is unbearable, you start having fevers, chills, and are unable to keep any meds down - then return to the ER. °  °

## 2018-10-16 NOTE — ED Triage Notes (Signed)
Left flank pain since 3am with vomiting.

## 2018-10-16 NOTE — ED Provider Notes (Signed)
[facility] EMERGENCY DEPARTMENT Provider Note   CSN: [id number] Arrival date & time: 10/16/18  0705     History   Chief Complaint Chief Complaint  Patient presents with  . Flank Pain    HPI Gail [name] is a 52 y.o. female.  HPI 52 year old with history of IBS, PCOS, hypertension and hyperlipidemia comes in with chief complaint of flank pain.  Patient states that she started having sudden onset pain on the left flank region that is radiating towards the lower part of her abdomen around 3 AM.  Patient has had multiple episodes of emesis since then that have been nonbilious.  Patient has no history of kidney stones and denies any history of similar pain in the past.  Past Medical History:  Diagnosis Date  . Allergy    History of shots as a child  . Anal fissure   . Arthritis   . History of infertility, female   . Hyperlipidemia   . Hypertension   . IBS (irritable bowel syndrome)   . Microscopic hematuria 2017   urology evaluated no sig cause    . PCO (polycystic ovaries)   . Rosacea     Patient Active Problem List   Diagnosis Date Noted  . OSA (obstructive sleep apnea) 05/04/2017  . Hypersomnia 01/03/2017  . Low normal vitamin B12 level 07/07/2015  . Axillary lump 09/18/2014  . Intermittent sleepiness 09/18/2014  . Flushes 04/17/2014  . Anxiety in acute stress reaction 03/13/2014  . Teeth grinding 07/06/2013  . Head pain left   07/06/2013  . Metabolic syndrome 40/98/1191  . Carbuncle 09/29/2012  . Rosacea 09/29/2012  . Chronic cough 05/03/2012  . Hearing difficulty 02/25/2012  . Eustachian tube dysfunction 02/25/2012  . Heart palpitations 07/07/2011  . Neck pain 04/01/2011  . Back pain 04/01/2011  . Obesity 01/05/2011  . ASTHMA 12/29/2009  . HYPERGLYCEMIA, BORDERLINE 11/25/2009  . PALPITATIONS, RECURRENT 09/23/2009  . TOBACCO USE, QUIT 09/23/2009  . ARTHRITIS 04/30/2008  . POLYCYSTIC OVARIES 10/16/2007  . FEMALE INFERTILITY 10/16/2007  .  HYPERLIPIDEMIA 07/18/2007  . Essential hypertension 07/18/2007    Past Surgical History:  Procedure Laterality Date  . CARPAL TUNNEL RELEASE     x2 bilateral  . KNEE ARTHROSCOPY     left   . WISDOM TOOTH EXTRACTION    . wrist tendon release     bilateral thumbs     OB History   None      Home Medications    Prior to Admission medications   Medication Sig Start Date End Date Taking? Authorizing Provider  Cyanocobalamin (B-12) 1000 MCG SUBL Place under the tongue.    [provider]  diltiazem (DILT-XR) 240 MG 24 hr capsule TAKE 1 CAPSULE BY MOUTH EVERY DAY 09/20/18   Panosh, Standley Brooking, MD  fluticasone (FLONASE) 50 MCG/ACT nasal spray 2 sprays per nostril daily.  MUST HAVE OFFICE VISIT FOR FURTHER REFILLS. 06/29/13   Panosh, Standley Brooking, MD  HYDROcodone-acetaminophen (NORCO/VICODIN) 5-325 MG tablet Take 1 tablet by mouth every 8 (eight) hours as needed for up to 3 days for severe pain. 10/16/18 10/19/18  Varney Biles, MD  ibuprofen (ADVIL,MOTRIN) 600 MG tablet Take 1 tablet (600 mg total) by mouth every 6 (six) hours as needed. 10/16/18   Varney Biles, MD  Lactobacillus (ACIDOPHILUS) 10 MG CAPS Take by mouth daily.     [provider]  levonorgestrel (MIRENA) 20 MCG/24HR IUD 1 each by Intrauterine route once.      [provider]  losartan (COZAAR) 50 MG tablet TAKE 1 TABLET(50 MG) BY MOUTH DAILY 10/10/17   Panosh, Standley Brooking, MD  losartan (COZAAR) 50 MG tablet TAKE 1 TABLET(50 MG) BY MOUTH DAILY 08/07/18   Panosh, Standley Brooking, MD  metFORMIN (GLUCOPHAGE-XR) 500 MG 24 hr tablet TAKE 2 TABLETS BY MOUTH EVERY DAY 09/27/18   Panosh, Standley Brooking, MD  nystatin ointment (MYCOSTATIN) Apply topically as directed 09/26/18   Nafziger, Tommi Rumps, NP  ondansetron (ZOFRAN ODT) 8 MG disintegrating tablet Take 1 tablet (8 mg total) by mouth every 8 (eight) hours as needed for nausea. 10/16/18   Varney Biles, MD  RESTASIS 0.05 % ophthalmic emulsion  12/20/14   [provider]  Zinc  Oxide 30.6 % CREA Apply topically as needed.     [provider]  ranitidine (ZANTAC) 300 MG tablet Take 1 tablet (300 mg total) by mouth 2 (two) times daily. 01/31/12 02/25/12  Panosh, Standley Brooking, MD    Family History Family History  Problem Relation Age of Onset  . Hypertension Mother   . Heart disease Mother   . Hypertension Father   . ALS Father        deceased  . Dementia Maternal Grandfather   . Heart disease Paternal Grandmother   . Dementia Paternal Grandfather   . Hypertension Unknown   . Lung cancer Unknown        uncle  . Breast cancer Unknown        great aunt    Social History Social History   Tobacco Use  . Smoking status: Former Smoker    Packs/day: 0.50    Years: 7.00    Pack years: 3.50    Types: Cigarettes    Last attempt to quit: 12/13/1986    Years since quitting: 31.8  . Smokeless tobacco: Never Used  Substance Use Topics  . Alcohol use: No    Alcohol/week: 0.0 standard drinks  . Drug use: No     Allergies   Ace inhibitors; Codeine; Erythromycin ethylsuccinate; Doxycycline; and Latex   Review of Systems Review of Systems  Constitutional: Positive for activity change.  Respiratory: Negative for cough, chest tightness and shortness of breath.   Cardiovascular: Negative for chest pain.  Gastrointestinal: Positive for nausea and vomiting.  Genitourinary: Positive for flank pain. Negative for dysuria and hematuria.  Allergic/Immunologic: Negative for immunocompromised state.  Hematological: Does not bruise/bleed easily.  All other systems reviewed and are negative.    Physical Exam Updated Vital Signs BP 132/83 (BP Location: Right Arm)   Pulse 83   Temp 98.2 F (36.8 C) (Oral)   Resp 16   Ht 5' (1.524 m)   Wt 81.6 kg   SpO2 97%   BMI 35.15 kg/m   Physical Exam  Constitutional: She is oriented to person, place, and time. She appears well-developed. She appears distressed.  Distressed  due to vomiting  HENT:  Head:  Normocephalic and atraumatic.  Eyes: EOM are normal.  Neck: Normal range of motion. Neck supple.  Cardiovascular: Normal rate.  Pulmonary/Chest: Effort normal.  Abdominal: Soft. Bowel sounds are normal. There is tenderness. There is no guarding.  Neurological: She is alert and oriented to person, place, and time.  Skin: Skin is warm and dry.  Nursing note and vitals reviewed.    ED Treatments / Results  Labs (all labs ordered are listed, but only abnormal results are displayed) Labs Reviewed  CBC WITH DIFFERENTIAL/PLATELET - Abnormal; Notable for the following components:  Result Value   HCT 46.1 (*)    Neutro Abs 8.1 (*)    All other components within normal limits  BASIC METABOLIC PANEL - Abnormal; Notable for the following components:   Glucose, Bld 148 (*)    Creatinine, Ser 1.04 (*)    All other components within normal limits  URINALYSIS, ROUTINE W REFLEX MICROSCOPIC - Abnormal; Notable for the following components:   APPearance CLOUDY (*)    Specific Gravity, Urine >1.030 (*)    Hgb urine dipstick LARGE (*)    Ketones, ur 15 (*)    Protein, ur 30 (*)    All other components within normal limits  URINALYSIS, MICROSCOPIC (REFLEX) - Abnormal; Notable for the following components:   Bacteria, UA MANY (*)    All other components within normal limits    EKG None  Radiology Ct Renal Stone Study  Result Date: 10/16/2018 CLINICAL DATA:  Left flank pain starting at 3 a.m. with vomiting. EXAM: CT ABDOMEN AND PELVIS WITHOUT CONTRAST TECHNIQUE: Multidetector CT imaging of the abdomen and pelvis was performed following the standard protocol without IV contrast. COMPARISON:  05/17/2016 FINDINGS: Lower chest: Unremarkable Hepatobiliary: Mild dependent density in the gallbladder favoring sludge or gallstones. Otherwise unremarkable. Pancreas: Unremarkable Spleen: Unremarkable Adrenals/Urinary Tract: Mild left hydronephrosis due to a 3 mm in long axis calculus in the proximal  ureter just past the UPJ. The ureter distal to this level is of normal caliber and no other urinary tract calculi are identified. Adrenal glands normal. Stomach/Bowel: Unremarkable Vascular/Lymphatic: Aortoiliac atherosclerotic vascular disease. Reproductive: An IUD appears satisfactorily positioned centrally in the uterus. 1.7 cm hypodense lesion along the right ovary, probably a small cyst or follicle. Other: No supplemental non-categorized findings. Musculoskeletal: Lumbar spondylosis and degenerative disc disease with degenerative endplate sclerosis notably at T12-L1 and L4-5, as well as dextroconvex upper lumbar scoliosis. In conjunction with spondylosis and degenerative disc disease there is resulting impingement at L1-2, L3-4, L4-5, and L5-S1. IMPRESSION: 1. Mild left hydronephrosis due to a 3 mm proximal ureteral calculus on the left. No other urinary tract calculi observed. 2. Aortic Atherosclerosis (ICD10-I70.0). Multilevel lumbar impingement. 3. Sludge versus less likely gallstones in the gallbladder. Electronically Signed   By: Van Clines M.D.   On: 10/16/2018 08:09    Procedures Procedures (including critical care time)  Medications Ordered in ED Medications  ketorolac (TORADOL) 30 MG/ML injection 15 mg (15 mg Intravenous Given 10/16/18 0749)  ondansetron (ZOFRAN) injection 4 mg (4 mg Intravenous Given 10/16/18 0748)  sodium chloride 0.9 % bolus 500 mL ( Intravenous Stopped 10/16/18 1005)  acetaminophen (TYLENOL) tablet 650 mg (650 mg Oral Given 10/16/18 0859)  ondansetron (ZOFRAN-ODT) disintegrating tablet 4 mg (4 mg Oral Given 10/16/18 0900)     Initial Impression / Assessment and Plan / ED Course  I have reviewed the triage vital signs and the nursing notes.  Pertinent labs & imaging results that were available during my care of the patient were reviewed by me and considered in my medical decision making (see chart for details).  Clinical Course as of Oct 17 1051  Mon Oct 16, 2018  1051 Results from the ER discussed with the patient.  It appears that her symptoms are because of the stone.  Urine has bacteria, however we do not think she has UTI or pyelonephritis based on the sudden onset of her symptoms without any dysuria or urinary frequency.  CT Renal Stone Study [AN]  8546 Patient's pain and nausea are not controlled.  Ready for discharge.   [AN]    Clinical Course User Index [AN] Varney Biles, MD   53 year old female comes in with sudden onset acute left flank pain.  Patient denies any UTI-like symptoms.  She has a history of PCOS, no history of kidney stones.  Patient has associated nausea with vomiting. I suspect that she has either a renal stone.  Given the PCOS history, the pain could be secondary to large ovarian cyst or even torsion, but given patient's age that is less likely.  CT renal stone ordered.  If the scan is negative then will need an ultrasound.  Symptom control for now initiated.   Final Clinical Impressions(s) / ED Diagnoses   Final diagnoses:  Ureteral colic    ED Discharge Orders         Ordered    ondansetron (ZOFRAN ODT) 8 MG disintegrating tablet  Every 8 hours PRN     10/16/18 1048    ibuprofen (ADVIL,MOTRIN) 600 MG tablet  Every 6 hours PRN     10/16/18 1048    HYDROcodone-acetaminophen (NORCO/VICODIN) 5-325 MG tablet  Every 8 hours PRN     10/16/18 1048           Varney Biles, MD 10/16/18 1052

## 2018-10-24 DIAGNOSIS — N201 Calculus of ureter: Secondary | ICD-10-CM | POA: Diagnosis not present

## 2018-11-01 DIAGNOSIS — N201 Calculus of ureter: Secondary | ICD-10-CM | POA: Diagnosis not present

## 2018-11-12 ENCOUNTER — Other Ambulatory Visit: Payer: Self-pay | Admitting: Internal Medicine

## 2018-11-17 DIAGNOSIS — N201 Calculus of ureter: Secondary | ICD-10-CM | POA: Diagnosis not present

## 2018-11-22 DIAGNOSIS — N201 Calculus of ureter: Secondary | ICD-10-CM | POA: Diagnosis not present

## 2018-12-21 ENCOUNTER — Telehealth: Payer: Self-pay | Admitting: Internal Medicine

## 2018-12-21 DIAGNOSIS — R002 Palpitations: Secondary | ICD-10-CM

## 2018-12-21 NOTE — Telephone Encounter (Signed)
Patient called to say that she was having more palpitations    I have not seen her in several years    Recomm that she be set up for an event monitor (3 wk) and I will see her in clinic after as a return pt

## 2018-12-22 NOTE — Telephone Encounter (Signed)
Contacted patient to inform she will be set up for 3 week event monitor for palpitations.  Scheduled follow up 02/09/19 with Dr. Harrington Challenger.    Pt appreciative for the call.

## 2018-12-22 NOTE — Addendum Note (Signed)
Addended by: Rodman Key on: 12/22/2018 04:14 PM   Modules accepted: Orders

## 2019-01-05 ENCOUNTER — Telehealth: Payer: Self-pay | Admitting: Internal Medicine

## 2019-01-05 NOTE — Telephone Encounter (Signed)
New message   Pt stated that she tried to contact her insurance to find out how much event monitor will cost . She needs more information about monitor in order for them to determine cost

## 2019-01-07 ENCOUNTER — Other Ambulatory Visit: Payer: Self-pay | Admitting: Internal Medicine

## 2019-01-08 ENCOUNTER — Ambulatory Visit (INDEPENDENT_AMBULATORY_CARE_PROVIDER_SITE_OTHER): Payer: BLUE CROSS/BLUE SHIELD

## 2019-01-08 DIAGNOSIS — R002 Palpitations: Secondary | ICD-10-CM | POA: Diagnosis not present

## 2019-01-08 NOTE — Telephone Encounter (Signed)
Left Message.  Patient can contact [employer] directly for quote.  Their billing number is [phone].  Their tax ID # is [id number].  The CPT code for a cardiac event monitor is 93271.   Our office will bill for hookup and interpretation, our CPT billing codes are 93270 and 93272/

## 2019-01-09 ENCOUNTER — Telehealth: Payer: Self-pay | Admitting: Internal Medicine

## 2019-01-09 MED ORDER — RIVAROXABAN 20 MG PO TABS: 20.0000 mg | ORAL_TABLET | Freq: Every day | ORAL | 6 refills | Status: DC

## 2019-01-09 NOTE — Telephone Encounter (Signed)
Call transferred to me from operator. I spoke to Dr. Margarito Liner who talked to monitor company overnight. Dr. Margarito Liner reported to me that pt had a report of atrial flutter HR 80-90, converted spontaneously.  When the monitor company called her she reported that she felt palpitations.   Obtained monitor report.  This is day 1 - critical event 01/08/19 at 9:28 pm CST rate 80-90. Atrial flutter with variable conduction.  Will route to Dr. Harrington Challenger for review.  Pt is scheduled with Dr. Harrington Challenger 02/09/19.  Has not been seen in office since 2014.

## 2019-01-09 NOTE — Telephone Encounter (Addendum)
Cardiologist Dr. Margarito Liner covering in the hospital called wanting to speak to nurse in triage about patient that is wearing a heart monitor.  Call transferred to nurse [place].

## 2019-01-09 NOTE — Telephone Encounter (Signed)
Monitor report reviewed by Dr. Johnsie Cancel (DOD) and scanned into Epic for Dr. Harrington Challenger to review.

## 2019-01-09 NOTE — Telephone Encounter (Signed)
Spoke with patient. She is aware to pick up Xarelto at [facility] and will start this evening. Informed of copay card and 30 day free trial card.  She may come to office to get these, depending on what her copay is. Aware I am sending a message to Dr. Tanna Furry scheduler to call patient for aflutter consult.

## 2019-01-09 NOTE — Telephone Encounter (Signed)
I have spoken briefly with pt   She will contact me later today to discuss atrial flutter Pt should be on Xarelto 20 mg Also, I reviewed with Beckie Salts   He has agreed to see pt re atrial flutter   Please try to set patient up to be seen I will call pt later today

## 2019-01-10 ENCOUNTER — Ambulatory Visit: Payer: BLUE CROSS/BLUE SHIELD | Admitting: Family Medicine

## 2019-01-10 ENCOUNTER — Telehealth: Payer: Self-pay | Admitting: Internal Medicine

## 2019-01-10 ENCOUNTER — Encounter: Payer: Self-pay | Admitting: Family Medicine

## 2019-01-10 VITALS — BP 125/84 | HR 90 | Temp 98.5°F | Resp 16 | Ht 61.0 in | Wt 179.2 lb

## 2019-01-10 DIAGNOSIS — J988 Other specified respiratory disorders: Secondary | ICD-10-CM

## 2019-01-10 DIAGNOSIS — R05 Cough: Secondary | ICD-10-CM | POA: Diagnosis not present

## 2019-01-10 DIAGNOSIS — R0989 Other specified symptoms and signs involving the circulatory and respiratory systems: Secondary | ICD-10-CM

## 2019-01-10 DIAGNOSIS — J989 Respiratory disorder, unspecified: Secondary | ICD-10-CM

## 2019-01-10 DIAGNOSIS — R059 Cough, unspecified: Secondary | ICD-10-CM

## 2019-01-10 MED ORDER — AMOXICILLIN 500 MG PO CAPS: 500.0000 mg | ORAL_CAPSULE | Freq: Three times a day (TID) | ORAL | 0 refills | Status: AC

## 2019-01-10 MED ORDER — PREDNISONE 20 MG PO TABS: 40.0000 mg | ORAL_TABLET | Freq: Every day | ORAL | 0 refills | Status: AC

## 2019-01-10 MED ORDER — BENZONATATE 100 MG PO CAPS: 200.0000 mg | ORAL_CAPSULE | Freq: Two times a day (BID) | ORAL | 0 refills | Status: AC | PRN

## 2019-01-10 MED ORDER — ALBUTEROL SULFATE HFA 108 (90 BASE) MCG/ACT IN AERS: 2.0000 | INHALATION_SPRAY | Freq: Four times a day (QID) | RESPIRATORY_TRACT | 0 refills | Status: DC | PRN

## 2019-01-10 MED ORDER — HYDROCODONE-HOMATROPINE 5-1.5 MG/5ML PO SYRP: 5.0000 mL | ORAL_SOLUTION | Freq: Two times a day (BID) | ORAL | 0 refills | Status: AC | PRN

## 2019-01-10 NOTE — Telephone Encounter (Signed)
New Message:    Patient calling to let Dr. Harrington Challenger know that her primary care doctor has put her on some medication. She would like for some one to call her back if there are any questions.

## 2019-01-10 NOTE — Telephone Encounter (Signed)
Called pt   OK to take meds    Keep taking Xarelto

## 2019-01-10 NOTE — Patient Instructions (Signed)
  [name] I have seen you today for an acute visit.  A few things to remember from today's visit:   Reactive airway disease that is not asthma - Plan: predniSONE (DELTASONE) 20 MG tablet, albuterol (PROVENTIL HFA;VENTOLIN HFA) 108 (90 Base) MCG/ACT inhaler  Cough - Plan: HYDROcodone-homatropine (HYCODAN) 5-1.5 MG/5ML syrup, benzonatate (TESSALON) 100 MG capsule   If medications prescribed today, they will not be refill upon request, a follow up appointment with PCP will be necessary to discuss continuation of of treatment if appropriate.   Albuterol inh 2 puff every 6 hours for a week then as needed for wheezing or shortness of breath.   Take prednisone with breakfast. I do not think you need antibiotics at this time but if you do not feel any better in 4 days or if you develop fever you can start taking doxycycline with food.    In general please monitor for signs of worsening symptoms and seek immediate medical attention if any concerning.  If symptoms are not resolved in 1-2 weeks you should schedule a follow up appointment with your doctor, before if needed.  I hope you get better soon!

## 2019-01-10 NOTE — Progress Notes (Signed)
ACUTE VISIT  HPI:  Chief Complaint  Patient presents with  . Persistant Cough    started last Tuesday    Gail [name] is a 53 y.o.female here today complaining of 8 days of respiratory symptoms. Non productive cough, intermittent spells. Wheezing that is worse at bedtime.  Symptoms are not aggravated with exertion.  No Hx of asthma but she has used inhalers before. Greening rhinorrhea,usually in the morning.  "Sometimes" SOB.  Denies fever,chills,or body aches.  Cough interfering with sleep.  Cough  This is a new problem. The current episode started in the past 7 days. The problem has been waxing and waning. The cough is non-productive. Associated symptoms include ear congestion, nasal congestion, postnasal drip and rhinorrhea. Pertinent negatives include no chills, ear pain, fever, headaches, heartburn, hemoptysis, myalgias, rash, sore throat, shortness of breath, weight loss or wheezing. The symptoms are aggravated by lying down. She has tried OTC cough suppressant for the symptoms. The treatment provided no relief. Her past medical history is significant for environmental allergies. There is no history of asthma.    No Hx of recent travel. No sick contact. No known insect bite.  Hx of allergies: Yes, she uses Flonase nasal spray daily. Former smoker.  OTC medications for this problem: Delsym, which has not helped. "Nothing OTC" helped before.  Recently Dx with atrial flutter,she is wearing a heart monitor.  She has had of chronic cough,resolved with Gabapentin. Evaluated by ENT in 02/2018, cough thought to be related with GERD. She has not noted heartburn,nausea,vomiting,or epigastric pain.   Review of Systems  Constitutional: Positive for activity change and fatigue. Negative for appetite change, chills, fever and weight loss.  HENT: Positive for congestion, postnasal drip and rhinorrhea. Negative for ear pain, mouth sores, sinus pressure, sore  throat, trouble swallowing and voice change.   Respiratory: Positive for cough. Negative for hemoptysis, shortness of breath and wheezing.   Gastrointestinal: Negative for abdominal pain, diarrhea, heartburn, nausea and vomiting.  Musculoskeletal: Negative for myalgias and neck pain.  Skin: Negative for rash.  Allergic/Immunologic: Positive for environmental allergies.  Neurological: Negative for syncope, weakness and headaches.  Hematological: Negative for adenopathy. Does not bruise/bleed easily.  Psychiatric/Behavioral: Positive for sleep disturbance. Negative for confusion.      Current Outpatient Medications on File Prior to Visit  Medication Sig Dispense Refill  . Cyanocobalamin (B-12) 1000 MCG SUBL Place under the tongue.    . diltiazem (DILT-XR) 240 MG 24 hr capsule TAKE 1 CAPSULE BY MOUTH DAILY 90 capsule 0  . fluticasone (FLONASE) 50 MCG/ACT nasal spray 2 sprays per nostril daily.  MUST HAVE OFFICE VISIT FOR FURTHER REFILLS. 16 g 0  . HYDROcodone-Acetaminophen 5-300 MG TABS Take 1 tablet by mouth every 6 (six) hours.    Marland Kitchen ibuprofen (ADVIL,MOTRIN) 600 MG tablet Take 1 tablet (600 mg total) by mouth every 6 (six) hours as needed. 30 tablet 0  . Lactobacillus (ACIDOPHILUS) 10 MG CAPS Take by mouth daily.     Marland Kitchen levonorgestrel (MIRENA) 20 MCG/24HR IUD 1 each by Intrauterine route once.      Marland Kitchen losartan (COZAAR) 50 MG tablet TAKE 1 TABLET(50 MG) BY MOUTH DAILY 90 tablet 0  . metFORMIN (GLUCOPHAGE-XR) 500 MG 24 hr tablet TAKE 2 TABLETS BY MOUTH EVERY DAY 180 tablet 0  . nystatin ointment (MYCOSTATIN) Apply topically as directed 30 g 0  . ondansetron (ZOFRAN ODT) 8 MG disintegrating tablet Take 1 tablet (8 mg total) by mouth every  8 (eight) hours as needed for nausea. 20 tablet 0  . RESTASIS 0.05 % ophthalmic emulsion     . rivaroxaban (XARELTO) 20 MG TABS tablet Take 1 tablet (20 mg total) by mouth daily with supper. 30 tablet 6  . Zinc Oxide 30.6 % CREA Apply topically as needed.       . [DISCONTINUED] ranitidine (ZANTAC) 300 MG tablet Take 1 tablet (300 mg total) by mouth 2 (two) times daily. 60 tablet 1   No current facility-administered medications on file prior to visit.      Past Medical History:  Diagnosis Date  . Allergy    History of shots as a child  . Anal fissure   . Arthritis   . History of infertility, female   . Hyperlipidemia   . Hypertension   . IBS (irritable bowel syndrome)   . Microscopic hematuria 2017   urology evaluated no sig cause    . PCO (polycystic ovaries)   . Rosacea    Allergies  Allergen Reactions  . Ace Inhibitors Cough  . Codeine Itching  . Erythromycin Ethylsuccinate     Stomach hurts REACTION: unspecified   can take azithromycin  . Doxycycline Rash  . Latex Rash    Social History   Socioeconomic History  . Marital status: Married    Spouse name: Not on file  . Number of children: 0  . Years of education: Not on file  . Highest education level: Not on file  Occupational History  . Occupation: INFANT Product manager: [employer]  Social Needs  . Financial resource strain: Not on file  . Food insecurity:    Worry: Not on file    Inability: Not on file  . Transportation needs:    Medical: Not on file    Non-medical: Not on file  Tobacco Use  . Smoking status: Former Smoker    Packs/day: 0.50    Years: 7.00    Pack years: 3.50    Types: Cigarettes    Last attempt to quit: 12/13/1986    Years since quitting: 32.1  . Smokeless tobacco: Never Used  Substance and Sexual Activity  . Alcohol use: No    Alcohol/week: 0.0 standard drinks  . Drug use: No  . Sexual activity: Not on file    Comment: married  Lifestyle  . Physical activity:    Days per week: Not on file    Minutes per session: Not on file  . Stress: Not on file  Relationships  . Social connections:    Talks on phone: Not on file    Gets together: Not on file    Attends religious service: Not on file    Active member of club  or organization: Not on file    Attends meetings of clubs or organizations: Not on file    Relationship status: Not on file  Other Topics Concern  . 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    Vitals:   01/10/19 1539  BP: 125/84  Pulse: 90  Resp: 16  Temp: 98.5 F (36.9 C)  SpO2: 98%   Body mass index is 33.87 kg/m.  Physical Exam  Nursing note and vitals reviewed.  Constitutional: She is oriented to person, place, and time. She appears well-developed. She does not appear ill. No distress.  HENT:  Head: Normocephalic and atraumatic.  Right Ear: Tympanic membrane, external ear and ear canal normal.  Left Ear: Tympanic membrane, external ear and ear canal normal.  Nose: Rhinorrhea present. Right sinus exhibits no maxillary sinus tenderness and no frontal sinus tenderness. Left sinus exhibits no maxillary sinus tenderness and no frontal sinus tenderness.  Mouth/Throat: Oropharynx is clear and moist and mucous membranes are normal.  Eyes: Conjunctivae are normal.  Neck: No tracheal deviation present.  Cardiovascular: Normal rate and regular rhythm.  No murmur heard. Respiratory: Effort normal and breath sounds normal. No stridor. No respiratory distress.  Prolonged expiration. Persistent nonproductive cough during visit.  Lymphadenopathy:       Head (right side): No submandibular adenopathy present.       Head (left side): No submandibular adenopathy present.    She has no cervical adenopathy.  Neurological: She is alert and oriented to person, place, and time. She has normal strength.  Skin: Skin is warm. No rash noted. No erythema.  Psychiatric: She has a normal mood and affect. Her speech is normal.  Well groomed, good eye contact.      ASSESSMENT AND PLAN:   Ms. Leanette was seen today for  persistant cough.  Diagnoses and all orders for this visit:  Reactive airway disease that is not asthma Side effects of Prednisone discussed. Albuterol inh 2 puff every 6 hours for a week then as needed for wheezing or shortness of breath.  Instructed about warning signs. -     predniSONE (DELTASONE) 20 MG tablet; Take 2 tablets (40 mg total) by mouth daily with breakfast for 5 days. -     albuterol (PROVENTIL HFA;VENTOLIN HFA) 108 (90 Base) MCG/ACT inhaler; Inhale 2 puffs into the lungs every 6 (six) hours as needed for wheezing or shortness of breath.  Cough We also need to consider her as a etiologic factor for cough, GERD and COPD among some. Hydrocodone side effects discussed,she has tolerated well in the past.  -     HYDROcodone-homatropine (HYCODAN) 5-1.5 MG/5ML syrup; Take 5 mLs by mouth every 12 (twelve) hours as needed for up to 10 days. -     benzonatate (TESSALON) 100 MG capsule; Take 2 capsules (200 mg total) by mouth 2 (two) times daily as needed for up to 10 days.  Respiratory tract infection I do not think antibiotic is needed at this time.Recommend starting abx if not any better in 4 days. We do have many options in regard to antibiotic treatment given her history of atrial fibrillation and allergy to doxycycline. Prescription for amoxicillin sent to her pharmacy, instructed to fill prescription if fever develops or she is not any better in 4 to 5 days, or if she feels worse.  -     amoxicillin (AMOXIL) 500 MG capsule; Take 1 capsule (500 mg total) by mouth 3 (three) times daily for 7 days.      Return if symptoms worsen or fail to improve.      Timiko Offutt G. Martinique, MD  [facility]. [place] office.

## 2019-01-10 NOTE — Telephone Encounter (Signed)
Will route to Dr. Harrington Challenger. Pt was seen today in primary care for reactive airway. Was started on prednisone, albuterol inhaler, hycodan and tessalon.  Wanted to know if ok with xarelto, atrial flutter.  Advised prednisone and albuterol may exacerbate her palpitations, but take as prescribed so symptoms do not get worse.  Aware I will forward to Dr. Harrington Challenger to inform.  Waiting for appointment with Dr. Lovena Le.

## 2019-01-17 ENCOUNTER — Other Ambulatory Visit: Payer: Self-pay | Admitting: *Deleted

## 2019-01-17 DIAGNOSIS — R002 Palpitations: Secondary | ICD-10-CM

## 2019-01-22 ENCOUNTER — Telehealth: Payer: Self-pay | Admitting: Internal Medicine

## 2019-01-22 DIAGNOSIS — N939 Abnormal uterine and vaginal bleeding, unspecified: Secondary | ICD-10-CM | POA: Diagnosis not present

## 2019-01-22 NOTE — Telephone Encounter (Signed)
Copied from [place] [id number]. Topic: Quick Communication - Rx Refill/Question >> Jan 22, 2019  1:30 PM Alanda Slim E wrote: Medication: predniSONE (DELTASONE) 20 MG tablet  Pt still has cough and runny nose she was prescribed PredniSONE and told to take 2 tabs for 5 days by Dr. Martinique but was only sent a prescription for 3 days. Pt would like to have the rest of the medication sent or a refill since it was helping with her symptoms/ please advise and call Pt when sending medication   Has the patient contacted their pharmacy? Yes( only 3 day supply sent, not 5)    Preferred Pharmacy (with phone number or street name): [facility] [place], [place] - [address] [place] [address] [phone] (Phone) [phone] (Fax)    Agent: Please be advised that RX refills may take up to 3 business days. We ask that you follow-up with your pharmacy.

## 2019-01-23 ENCOUNTER — Ambulatory Visit: Payer: BLUE CROSS/BLUE SHIELD | Admitting: Internal Medicine

## 2019-01-23 ENCOUNTER — Encounter: Payer: Self-pay | Admitting: Internal Medicine

## 2019-01-23 VITALS — BP 126/68 | HR 130 | Temp 98.7°F | Wt 180.9 lb

## 2019-01-23 VITALS — BP 128/62 | HR 107 | Ht 61.0 in | Wt 181.0 lb

## 2019-01-23 DIAGNOSIS — J989 Respiratory disorder, unspecified: Secondary | ICD-10-CM

## 2019-01-23 DIAGNOSIS — R0989 Other specified symptoms and signs involving the circulatory and respiratory systems: Secondary | ICD-10-CM | POA: Diagnosis not present

## 2019-01-23 DIAGNOSIS — R05 Cough: Secondary | ICD-10-CM | POA: Diagnosis not present

## 2019-01-23 DIAGNOSIS — R002 Palpitations: Secondary | ICD-10-CM

## 2019-01-23 DIAGNOSIS — R053 Chronic cough: Secondary | ICD-10-CM

## 2019-01-23 MED ORDER — IPRATROPIUM-ALBUTEROL 0.5-2.5 (3) MG/3ML IN SOLN
3.0000 mL | Freq: Once | RESPIRATORY_TRACT | Status: AC
  Administered 2019-01-23: 3 mL via RESPIRATORY_TRACT

## 2019-01-23 MED ORDER — PREDNISONE 20 MG PO TABS: ORAL_TABLET | ORAL | 0 refills | Status: DC

## 2019-01-23 NOTE — Patient Instructions (Addendum)
This acts like a wheezy cough  prob post infectious  .     At this time  We can add more  Or  5 days  Prednisone..    No evidence  of  Bacterial infection  Can consider cxray if  persistent or progressive . Ok to use albuterol also  And cough med

## 2019-01-23 NOTE — Patient Instructions (Addendum)
Medication Instructions:  Your physician recommends that you continue on your current medications as directed. Please refer to the Current Medication list given to you today.  Labwork: None ordered.  Testing/Procedures: None ordered.  Follow-Up: Your physician wants you to follow-up based on results of heart monitor with Dr. Lovena Le.  Any Other Special Instructions Will Be Listed Below (If Applicable).  If you need a refill on your cardiac medications before your next appointment, please call your pharmacy.

## 2019-01-23 NOTE — Progress Notes (Signed)
HPI Gail [name] is referred today by Dr. Harrington Challenger for evaluation of atrial flutter. She is a pleasant 53 yo woman with HTN, DM, and obesity. She has noted palpitations for several weeks. She was found to be in atrial flutter on her zio patch with a mostly controlled VR. She has not had syncope.  Allergies  Allergen Reactions  . Ace Inhibitors Cough  . Codeine Itching  . Erythromycin Ethylsuccinate     Stomach hurts REACTION: unspecified   can take azithromycin  . Doxycycline Rash  . Latex Rash     Current Outpatient Medications  Medication Sig Dispense Refill  . Cyanocobalamin (B-12) 1000 MCG SUBL Place under the tongue.    . diltiazem (DILT-XR) 240 MG 24 hr capsule TAKE 1 CAPSULE BY MOUTH DAILY 90 capsule 0  . fluticasone (FLONASE) 50 MCG/ACT nasal spray 2 sprays per nostril daily.  MUST HAVE OFFICE VISIT FOR FURTHER REFILLS. 16 g 0  . HYDROcodone-Acetaminophen 5-300 MG TABS Take 1 tablet by mouth every 6 (six) hours.    Marland Kitchen ibuprofen (ADVIL,MOTRIN) 600 MG tablet Take 1 tablet (600 mg total) by mouth every 6 (six) hours as needed. 30 tablet 0  . Lactobacillus (ACIDOPHILUS) 10 MG CAPS Take by mouth daily.     Marland Kitchen levonorgestrel (MIRENA) 20 MCG/24HR IUD 1 each by Intrauterine route once.      Marland Kitchen losartan (COZAAR) 50 MG tablet TAKE 1 TABLET(50 MG) BY MOUTH DAILY 90 tablet 0  . metFORMIN (GLUCOPHAGE-XR) 500 MG 24 hr tablet TAKE 2 TABLETS BY MOUTH EVERY DAY 180 tablet 0  . nystatin ointment (MYCOSTATIN) Apply topically as directed 30 g 0  . RESTASIS 0.05 % ophthalmic emulsion     . rivaroxaban (XARELTO) 20 MG TABS tablet Take 1 tablet (20 mg total) by mouth daily with supper. 30 tablet 6  . Zinc Oxide 30.6 % CREA Apply topically as needed.     Marland Kitchen albuterol (PROVENTIL HFA;VENTOLIN HFA) 108 (90 Base) MCG/ACT inhaler Inhale 2 puffs into the lungs every 6 (six) hours as needed for wheezing or shortness of breath. (Patient not taking: Reported on 01/23/2019) 1 Inhaler 0   No current  facility-administered medications for this visit.      Past Medical History:  Diagnosis Date  . Allergy    History of shots as a child  . Anal fissure   . Arthritis   . History of infertility, female   . Hyperlipidemia   . Hypertension   . IBS (irritable bowel syndrome)   . Microscopic hematuria 2017   urology evaluated no sig cause    . PCO (polycystic ovaries)   . Rosacea     ROS:   All systems reviewed and negative except as noted in the HPI.   Past Surgical History:  Procedure Laterality Date  . CARPAL TUNNEL RELEASE     x2 bilateral  . KNEE ARTHROSCOPY     left   . WISDOM TOOTH EXTRACTION    . wrist tendon release     bilateral thumbs     Family History  Problem Relation Age of Onset  . Hypertension Mother   . Heart disease Mother   . Hypertension Father   . ALS Father        deceased  . Dementia Maternal Grandfather   . Heart disease Paternal Grandmother   . Dementia Paternal Grandfather   . Hypertension Other   . Lung cancer Other        uncle  .  Breast cancer Other        great aunt     Social History   Socioeconomic History  . Marital status: Married    Spouse name: Not on file  . Number of children: 0  . Years of education: Not on file  . Highest education level: Not on file  Occupational History  . Occupation: INFANT Product manager: [employer]  Social Needs  . Financial resource strain: Not on file  . Food insecurity:    Worry: Not on file    Inability: Not on file  . Transportation needs:    Medical: Not on file    Non-medical: Not on file  Tobacco Use  . Smoking status: Former Smoker    Packs/day: 0.50    Years: 7.00    Pack years: 3.50    Types: Cigarettes    Last attempt to quit: 12/13/1986    Years since quitting: 32.1  . Smokeless tobacco: Never Used  Substance and Sexual Activity  . Alcohol use: No    Alcohol/week: 0.0 standard drinks  . Drug use: No  . Sexual activity: Not on file    Comment:  married  Lifestyle  . Physical activity:    Days per week: Not on file    Minutes per session: Not on file  . Stress: Not on file  Relationships  . Social connections:    Talks on phone: Not on file    Gets together: Not on file    Attends religious service: Not on file    Active member of club or organization: Not on file    Attends meetings of clubs or organizations: Not on file    Relationship status: Not on file  . Intimate partner violence:    Fear of current or ex partner: Not on file    Emotionally abused: Not on file    Physically abused: Not on file    Forced sexual activity: Not on file  Other Topics Concern  . 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     Ht 5\' 1"  (1.549 m)   Wt 181 lb (82.1 kg)   BMI 34.20 kg/m   Physical Exam:  Well appearing overweight, NAD HEENT: Unremarkable Neck:  No JVD, no thyromegally Lymphatics:  No adenopathy Back:  No CVA tenderness Lungs:  Clear with no wheezes HEART:  Regular rate rhythm, no murmurs, no rubs, no clicks Abd:  soft, positive bowel sounds, no organomegally, no rebound, no guarding Ext:  2 plus pulses, no edema, no cyanosis, no clubbing Skin:  No rashes no nodules Neuro:  CN II through XII intact, motor grossly intact  EKG - sinus tachycardia.   Assess/Plan: 1. Atrial flutter - I discussed the issues with the patient regarding systemic anti-coag and whether or not she has atrial fib as well. If no atrial fib, I would recommend EPS/RFA of atrial flutter. If she is also having fib, then I would start flecainide. 2. Obesity - she has polycystic ovaries and I have encouraged her to lose weight. 3. HTN - she has had Htn for 30 years but is well controlled. 4. DM - she is said to be glucose intolerant and has  been on metformin. Last CBG  was [place].D.

## 2019-01-23 NOTE — Progress Notes (Signed)
Chief Complaint  Patient presents with  . Cough    Pt states that her cough is not going away. Pt states she is coughing clear mucus up. Took predinsone for 3 days since only received enough pills for 3 days     HPI: Gail [name] 53 y.o. come in for   Seen dr Nettie Elm 1 29 for RAD not asthma   rx  pred x 5 days albuterol and add on amox  If needed .    Worse yesterday. Was better about    3 day  after [pred and then relapsed ? vx new  Began albuterol  And hled  For a few hours .  No fever  Infected phlegm  Uses cpap and notas bad at night  Using vics in nose    Had moniotor show a flutter  And put on anticoagulation  while under investigation . Had  Renal stone past in last months .  ROS: See pertinent positives and negatives per HPI.  Past Medical History:  Diagnosis Date  . Allergy    History of shots as a child  . Anal fissure   . Arthritis   . History of infertility, female   . Hyperlipidemia   . Hypertension   . IBS (irritable bowel syndrome)   . Microscopic hematuria 2017   urology evaluated no sig cause    . PCO (polycystic ovaries)   . Rosacea     Family History  Problem Relation Age of Onset  . Hypertension Mother   . Heart disease Mother   . Hypertension Father   . ALS Father        deceased  . Dementia Maternal Grandfather   . Heart disease Paternal Grandmother   . Dementia Paternal Grandfather   . Hypertension Other   . Lung cancer Other        uncle  . Breast cancer Other        great aunt    Social History   Socioeconomic History  . Marital status: Married    Spouse name: Not on file  . Number of children: 0  . Years of education: Not on file  . Highest education level: Not on file  Occupational History  . Occupation: INFANT Product manager: [employer]  Social Needs  . Financial resource strain: Not on file  . Food insecurity:    Worry: Not on file    Inability: Not on file  . Transportation needs:    Medical: Not  on file    Non-medical: Not on file  Tobacco Use  . Smoking status: Former Smoker    Packs/day: 0.50    Years: 7.00    Pack years: 3.50    Types: Cigarettes    Last attempt to quit: 12/13/1986    Years since quitting: 32.1  . Smokeless tobacco: Never Used  Substance and Sexual Activity  . Alcohol use: No    Alcohol/week: 0.0 standard drinks  . Drug use: No  . Sexual activity: Not on file    Comment: married  Lifestyle  . Physical activity:    Days per week: Not on file    Minutes per session: Not on file  . Stress: Not on file  Relationships  . Social connections:    Talks on phone: Not on file    Gets together: Not on file    Attends religious service: Not on file    Active member of club or organization:  Not on file    Attends meetings of clubs or organizations: Not on file    Relationship status: Not on file  Other Topics Concern  . 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    Outpatient Medications Prior to Visit  Medication Sig Dispense Refill  . albuterol (PROVENTIL HFA;VENTOLIN HFA) 108 (90 Base) MCG/ACT inhaler Inhale 2 puffs into the lungs every 6 (six) hours as needed for wheezing or shortness of breath. 1 Inhaler 0  . Cyanocobalamin (B-12) 1000 MCG SUBL Place under the tongue.    . diltiazem (DILT-XR) 240 MG 24 hr capsule TAKE 1 CAPSULE BY MOUTH DAILY 90 capsule 0  . fluticasone (FLONASE) 50 MCG/ACT nasal spray 2 sprays per nostril daily.  MUST HAVE OFFICE VISIT FOR FURTHER REFILLS. 16 g 0  . HYDROcodone-Acetaminophen 5-300 MG TABS Take 1 tablet by mouth every 6 (six) hours.    Marland Kitchen ibuprofen (ADVIL,MOTRIN) 600 MG tablet Take 1 tablet (600 mg total) by mouth every 6 (six) hours as needed. 30 tablet 0  . Lactobacillus (ACIDOPHILUS) 10 MG CAPS Take by mouth daily.     Marland Kitchen levonorgestrel  (MIRENA) 20 MCG/24HR IUD 1 each by Intrauterine route once.      Marland Kitchen losartan (COZAAR) 50 MG tablet TAKE 1 TABLET(50 MG) BY MOUTH DAILY 90 tablet 0  . metFORMIN (GLUCOPHAGE-XR) 500 MG 24 hr tablet TAKE 2 TABLETS BY MOUTH EVERY DAY 180 tablet 0  . nystatin ointment (MYCOSTATIN) Apply topically as directed 30 g 0  . RESTASIS 0.05 % ophthalmic emulsion     . rivaroxaban (XARELTO) 20 MG TABS tablet Take 1 tablet (20 mg total) by mouth daily with supper. 30 tablet 6  . Zinc Oxide 30.6 % CREA Apply topically as needed.      No facility-administered medications prior to visit.      EXAM:  BP 126/68 (BP Location: Right Arm, Patient Position: Sitting, Cuff Size: Normal)   Pulse (!) 130   Temp 98.7 F (37.1 C) (Oral)   Wt 180 lb 14.4 oz (82.1 kg)   BMI 34.18 kg/m   Body mass index is 34.18 kg/m.  GENERAL: vitals reviewed and listed above, alert, oriented, appears well hydrated and in no acute distress HEENT: atraumatic, conjunctiva  clear, no obvious abnormalities on inspection of external nose and ears OP : no lesion edema or exudate  NECK: no obvious masses on inspection palpation  LUNGS: exp wheezes   After  duoneb dec some better air movement  Cough spasm decrease   CV: HRRR, no clubbing cyanosis or  peripheral edema nl cap refill  MS: moves all extremities without noticeable focal  abnormality PSYCH: pleasant and cooperative, no obvious depression or anxiety Lab Results  Component Value Date   WBC 10.1 10/16/2018   HGB 14.7 10/16/2018   HCT 46.1 (H) 10/16/2018   PLT 239 10/16/2018   GLUCOSE 148 (H) 10/16/2018   CHOL 191 09/26/2018   TRIG 74.0 09/26/2018   HDL 51.60 09/26/2018   LDLDIRECT 160.7 12/10/2010   LDLCALC 124 (H) 09/26/2018   ALT 11 09/26/2018   AST 12 09/26/2018   NA 137 10/16/2018   K 3.7 10/16/2018   CL 103 10/16/2018   CREATININE 1.04 (H)  10/16/2018   BUN 16 10/16/2018   CO2 23 10/16/2018   TSH 2.22 09/26/2018   HGBA1C 5.6 09/26/2018   BP Readings from  Last 3 Encounters:  01/23/19 126/68  01/23/19 128/62  01/10/19 125/84    ASSESSMENT AND PLAN:  Discussed the following assessment and plan:  Cough, persistent - ? post infectious   exposure tot he child community  - Plan: ipratropium-albuterol (DUONEB) 0.5-2.5 (3) MG/3ML nebulizer solution 3 mL  Reactive airway  wheezing Clinical picture of reactive airway bronchospasm without obvious bacterial infection.  She never took the antibiotic and does not seem to have bacterial sinusitis or pneumonia presentation. Prednisone 5 days 60 60 40 40 40 albuterol cough med as planned if persistent progressive consider other intervention chest x-ray reevaluation. -Patient advised to return or notify health care team  if  new concerns arise.  Patient Instructions  This acts like a wheezy cough  prob post infectious  .     At this time  We can add more  Or  5 days  Prednisone..    No evidence  of  Bacterial infection  Can consider cxray if  persistent or progressive . Ok to use albuterol also  And cough med        Standley Brooking. Feliciana Narayan M.D.

## 2019-01-24 ENCOUNTER — Telehealth: Payer: Self-pay | Admitting: Internal Medicine

## 2019-01-24 NOTE — Telephone Encounter (Signed)
Please advise 

## 2019-01-24 NOTE — Telephone Encounter (Signed)
If acting like sinusitis which I think you are describing   then go ahead and take the antibiotic

## 2019-01-24 NOTE — Telephone Encounter (Signed)
Copied from [place] [id number]. Topic: Quick Communication - See Telephone Encounter >> Jan 24, 2019  3:15 PM Rutherford Nail, [place] wrote: CRM for notification. See Telephone encounter for: 01/24/19. Patient calling and states that she had seen Dr Regis Bill yesterday (01/23/2019) and is still having the deep cough, now she has nasal congestion and a low grade fever(99.4 underarm). States that Dr Martinique gave her an antibiotic when she had seen her, but told not to take if things improved. It did get better, but then came back. Patient would like to know what the next step is? Please advise.  CB#: [phone] [id number] - [place], [place] - [address]

## 2019-01-25 NOTE — Telephone Encounter (Signed)
Tried to call pt but had dead air okay for triage nurse to release info. CRm created

## 2019-01-25 NOTE — Telephone Encounter (Signed)
Patient called, advised of note below from Dr. Regis Bill on 01/24/19, patient verbalized understanding. Advised to take the antibiotic as prescribed until it's gone and if she's not getting better on the antibiotic, to call back as she may need another visit. She asked should she keep taking the Prednisone while on the antibiotic. I advised to continue the Prednisone as prescribed and if Dr. Regis Bill has any other recommendations, someone from the office will call back.

## 2019-02-02 DIAGNOSIS — Z30433 Encounter for removal and reinsertion of intrauterine contraceptive device: Secondary | ICD-10-CM | POA: Diagnosis not present

## 2019-02-02 DIAGNOSIS — N760 Acute vaginitis: Secondary | ICD-10-CM | POA: Diagnosis not present

## 2019-02-05 ENCOUNTER — Telehealth: Payer: Self-pay | Admitting: Internal Medicine

## 2019-02-05 DIAGNOSIS — I4892 Unspecified atrial flutter: Secondary | ICD-10-CM

## 2019-02-05 NOTE — Telephone Encounter (Signed)
New Message  Patient needs to reschedule appointment and wants to know if she should schedule with Dr. Harrington Challenger or Dr. Lovena Le.

## 2019-02-06 ENCOUNTER — Telehealth: Payer: Self-pay

## 2019-02-07 ENCOUNTER — Other Ambulatory Visit: Payer: Self-pay | Admitting: Family Medicine

## 2019-02-07 DIAGNOSIS — R0989 Other specified symptoms and signs involving the circulatory and respiratory systems: Secondary | ICD-10-CM

## 2019-02-07 DIAGNOSIS — J989 Respiratory disorder, unspecified: Secondary | ICD-10-CM

## 2019-02-08 NOTE — Telephone Encounter (Signed)
See mychart thread

## 2019-02-09 ENCOUNTER — Ambulatory Visit: Payer: BLUE CROSS/BLUE SHIELD | Admitting: Internal Medicine

## 2019-02-09 NOTE — Telephone Encounter (Signed)
Pt scheduled for ablation.

## 2019-02-09 NOTE — Addendum Note (Signed)
Addended by: Willeen Cass A on: 02/09/2019 02:53 PM   Modules accepted: Orders

## 2019-02-26 ENCOUNTER — Other Ambulatory Visit: Payer: Self-pay | Admitting: Internal Medicine

## 2019-03-05 ENCOUNTER — Other Ambulatory Visit: Payer: BLUE CROSS/BLUE SHIELD

## 2019-03-19 ENCOUNTER — Encounter (HOSPITAL_COMMUNITY): Admission: RE | Payer: Self-pay | Source: Home / Self Care

## 2019-03-19 ENCOUNTER — Ambulatory Visit (HOSPITAL_COMMUNITY)
Admission: RE | Admit: 2019-03-19 | Payer: BLUE CROSS/BLUE SHIELD | Source: Home / Self Care | Admitting: Internal Medicine

## 2019-03-19 SURGERY — A-FLUTTER ABLATION

## 2019-04-14 ENCOUNTER — Other Ambulatory Visit: Payer: Self-pay | Admitting: Internal Medicine

## 2019-04-17 ENCOUNTER — Ambulatory Visit: Payer: BLUE CROSS/BLUE SHIELD | Admitting: Internal Medicine

## 2019-04-23 ENCOUNTER — Ambulatory Visit (INDEPENDENT_AMBULATORY_CARE_PROVIDER_SITE_OTHER): Payer: BLUE CROSS/BLUE SHIELD | Admitting: Internal Medicine

## 2019-04-23 ENCOUNTER — Other Ambulatory Visit: Payer: Self-pay

## 2019-04-23 ENCOUNTER — Encounter: Payer: Self-pay | Admitting: Internal Medicine

## 2019-04-23 DIAGNOSIS — Z7901 Long term (current) use of anticoagulants: Secondary | ICD-10-CM | POA: Diagnosis not present

## 2019-04-23 DIAGNOSIS — M533 Sacrococcygeal disorders, not elsewhere classified: Secondary | ICD-10-CM | POA: Diagnosis not present

## 2019-04-23 MED ORDER — METHOCARBAMOL 500 MG PO TABS: 500.0000 mg | ORAL_TABLET | Freq: Three times a day (TID) | ORAL | 1 refills | Status: DC | PRN

## 2019-04-23 NOTE — Progress Notes (Signed)
Virtual Visit via Video Note  I connected with@ on 04/23/19 at  2:30 PM EDT by a video enabled telemedicine application and verified that I am speaking with the correct person using two identifiers. Location patient: home Location provider:work  Persons participating in the virtual visit: patient, provider  [place]th national recommendations  regarding COVID 19 pandemic   video visit is advised over in office visit for this patient.  Patient aware  of the limitations of evaluation and management by telemedicine and  availability of in person appointments. and agreed to proceed.   HPI: Gail [name] presents for video visit SDA  At home on line with  Recently a lot sitting  But then last  week did lots of activity  cleaning out a room and soon after  "tail bone pain"   Using tylenol but not that helpful  Worse after sitting a while and then getting up .   No radiation and no swelling   Sitting on conu tpillow  No gu gi relation  Is on xeralto and  Ablation has been delayed  Cause of covid restrictions  Day care out  For the past month   Remote hs of  Tail bone injury   ? Contusion   Better on its own .  No fever   Uncomfortable position at night but can sleep is not moving    ROS: See pertinent positives and negatives per HPI.  Past Medical History:  Diagnosis Date  . Allergy    History of shots as a child  . Anal fissure   . Arthritis   . History of infertility, female   . Hyperlipidemia   . Hypertension   . IBS (irritable bowel syndrome)   . Microscopic hematuria 2017   urology evaluated no sig cause    . PCO (polycystic ovaries)   . Rosacea     Past Surgical History:  Procedure Laterality Date  . CARPAL TUNNEL RELEASE     x2 bilateral  . KNEE ARTHROSCOPY     left   . WISDOM TOOTH EXTRACTION    . wrist tendon release     bilateral thumbs    Family History  Problem Relation Age of Onset  . Hypertension Mother   . Heart disease Mother   . Hypertension Father   .  ALS Father        deceased  . Dementia Maternal Grandfather   . Heart disease Paternal Grandmother   . Dementia Paternal Grandfather   . Hypertension Other   . Lung cancer Other        uncle  . Breast cancer Other        great aunt    Social History   Tobacco Use  . Smoking status: Former Smoker    Packs/day: 0.50    Years: 7.00    Pack years: 3.50    Types: Cigarettes    Last attempt to quit: 12/13/1986    Years since quitting: 32.3  . Smokeless tobacco: Never Used  Substance Use Topics  . Alcohol use: No    Alcohol/week: 0.0 standard drinks  . Drug use: No      Current Outpatient Medications:  .  Cyanocobalamin (B-12) 1000 MCG SUBL, Place under the tongue., Disp: , Rfl:  .  diltiazem (DILT-XR) 240 MG 24 hr capsule, TAKE 1 CAPSULE BY MOUTH DAILY, Disp: 90 capsule, Rfl: 0 .  fluticasone (FLONASE) 50 MCG/ACT nasal spray, 2 sprays per nostril daily.  MUST HAVE OFFICE VISIT  FOR FURTHER REFILLS., Disp: 16 g, Rfl: 0 .  HYDROcodone-Acetaminophen 5-300 MG TABS, Take 1 tablet by mouth every 6 (six) hours., Disp: , Rfl:  .  ibuprofen (ADVIL,MOTRIN) 600 MG tablet, Take 1 tablet (600 mg total) by mouth every 6 (six) hours as needed., Disp: 30 tablet, Rfl: 0 .  Lactobacillus (ACIDOPHILUS) 10 MG CAPS, Take by mouth daily. , Disp: , Rfl:  .  levonorgestrel (MIRENA) 20 MCG/24HR IUD, 1 each by Intrauterine route once.  , Disp: , Rfl:  .  losartan (COZAAR) 50 MG tablet, TAKE 1 TABLET(50 MG) BY MOUTH DAILY, Disp: 90 tablet, Rfl: 0 .  metFORMIN (GLUCOPHAGE-XR) 500 MG 24 hr tablet, TAKE 2 TABLETS BY MOUTH EVERY DAY, Disp: 180 tablet, Rfl: 0 .  methocarbamol (ROBAXIN) 500 MG tablet, Take 1-2 tablets (500-1,000 mg total) by mouth every 8 (eight) hours as needed for muscle spasms. Back pain, Disp: 30 tablet, Rfl: 1 .  nystatin ointment (MYCOSTATIN), Apply topically as directed, Disp: 30 g, Rfl: 0 .  predniSONE (DELTASONE) 20 MG tablet, Take 3 po qd for 2 days then 2 po qd for 3 days,or as directed,  Disp: 12 tablet, Rfl: 0 .  PROAIR HFA 108 (90 Base) MCG/ACT inhaler, INHALE 2 PUFFS INTO THE LUNGS EVERY 6 HOURS AS NEEDED FOR WHEEZING OR SHORTNESS OF BREATH, Disp: 8.5 g, Rfl: 0 .  RESTASIS 0.05 % ophthalmic emulsion, , Disp: , Rfl:  .  rivaroxaban (XARELTO) 20 MG TABS tablet, Take 1 tablet (20 mg total) by mouth daily with supper., Disp: 30 tablet, Rfl: 6 .  Zinc Oxide 30.6 % CREA, Apply topically as needed. , Disp: , Rfl:   EXAM: BP Readings from Last 3 Encounters:  01/23/19 126/68  01/23/19 128/62  01/10/19 125/84    VITALS per patient if applicable:  GENERAL: alert, oriented, appears well and in no acute distress  HEENT: atraumatic, conjunttiva clear, no obvious abnormalities on inspection of external nose and ears  NECK: normal movements of the head and neck  LUNGS: on inspection no signs of respiratory distress, breathing rate appears normal, no obvious gross SOB, gasping or wheezing  CV: no obvious cyanosis  MS: moves all visible extremities without noticeable abnormality  PSYCH/NEURO: pleasant and cooperative, no obvious depression or anxiety, speech and thought processing grossly intact   ASSESSMENT AND PLAN:  Discussed the following assessment and plan:  Sacral pain ?  Anticoagulant long-term use - awaiting ablation ? sacral pain after  Excessive back activity with   More sitting activity per day    Suspect ms      On anticoagulation  tylenol     Can try m relax at night   She asks about prednisone  Uncertain if will help but  Try  Nocturnal  Avoid prolonged sitting and  If  Not getting better .  Counseled.   Expectant management and discussion of plan and treatment with opportunity to ask questions and all were answered. The patient agreed with the plan and demonstrated an understanding of the instructions.   Advised to call back or seek an in-person evaluation if worsening  or having  further concerns .   Shanon Ace, MD

## 2019-04-30 ENCOUNTER — Other Ambulatory Visit: Payer: Self-pay | Admitting: Internal Medicine

## 2019-04-30 DIAGNOSIS — Z1231 Encounter for screening mammogram for malignant neoplasm of breast: Secondary | ICD-10-CM

## 2019-05-02 DIAGNOSIS — Z01419 Encounter for gynecological examination (general) (routine) without abnormal findings: Secondary | ICD-10-CM | POA: Diagnosis not present

## 2019-05-02 DIAGNOSIS — Z130 Encounter for screening for diseases of the blood and blood-forming organs and certain disorders involving the immune mechanism: Secondary | ICD-10-CM | POA: Diagnosis not present

## 2019-05-02 DIAGNOSIS — Z1389 Encounter for screening for other disorder: Secondary | ICD-10-CM | POA: Diagnosis not present

## 2019-05-02 DIAGNOSIS — Z6835 Body mass index (BMI) 35.0-35.9, adult: Secondary | ICD-10-CM | POA: Diagnosis not present

## 2019-05-27 ENCOUNTER — Other Ambulatory Visit: Payer: Self-pay | Admitting: Internal Medicine

## 2019-06-01 ENCOUNTER — Telehealth: Payer: Self-pay | Admitting: Internal Medicine

## 2019-06-01 DIAGNOSIS — Z20828 Contact with and (suspected) exposure to other viral communicable diseases: Secondary | ICD-10-CM | POA: Diagnosis not present

## 2019-06-01 NOTE — Telephone Encounter (Signed)
Spoke with the patient, phlebotomist stated that her blood was thick. I advised the patient that she was most likely dehydrated or the phlebotomist did not get a good insertion. She stated she has been taking her Xarelto. She agreed that is most likely the cause.

## 2019-06-01 NOTE — Telephone Encounter (Signed)
New Message               Patient is calling about the blood thinner she is on, Patient  Gail [name] to her OBGYN for labs and she told her that her  blood was thick and she needed to call and let her know cardiologist know. Patient is asking is this normal with her being on a blood thinner?

## 2019-06-06 IMAGING — CT CT RENAL STONE PROTOCOL
2 of 4 series · 17 of 46 positions shown, 19 images · non-contrast
Comparison: 05/17/2016

CLINICAL DATA: Left flank pain starting at 3 a.m. with vomiting.

EXAM:
CT ABDOMEN AND PELVIS WITHOUT CONTRAST
TECHNIQUE: Multidetector CT imaging of the abdomen and pelvis was performed
following the standard protocol without IV contrast.

[Series 2: axial st · axial · 0.74mm/px · z∈[-430,+25]mm · 14 of 99 slices shown, 16 images]
[im 4/99  soft-tissue]
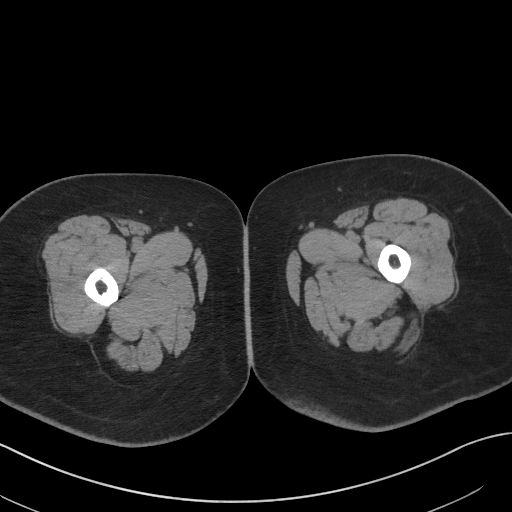
[im 4/99  bone]
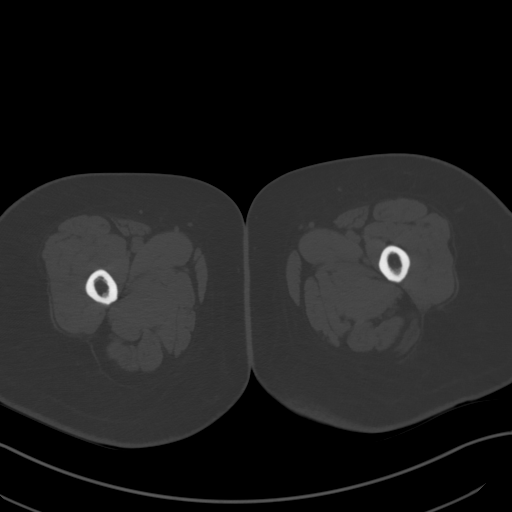
[im 12/99  soft-tissue]
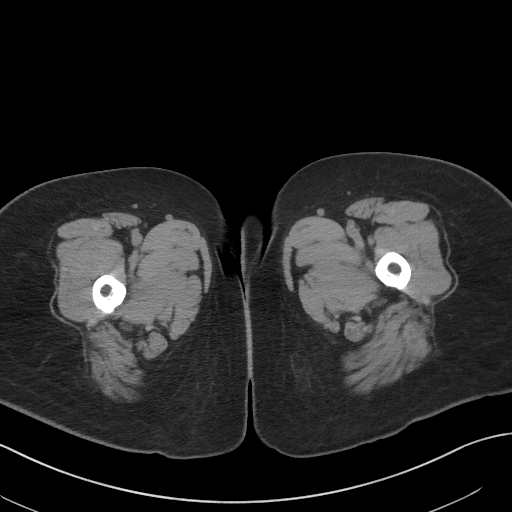
[im 20/99  soft-tissue]
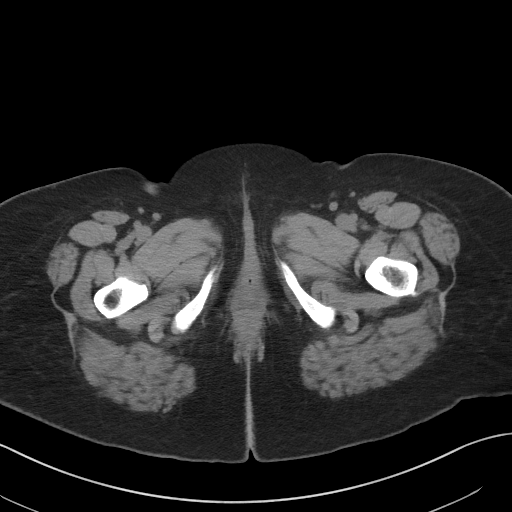
[im 28/99  soft-tissue]
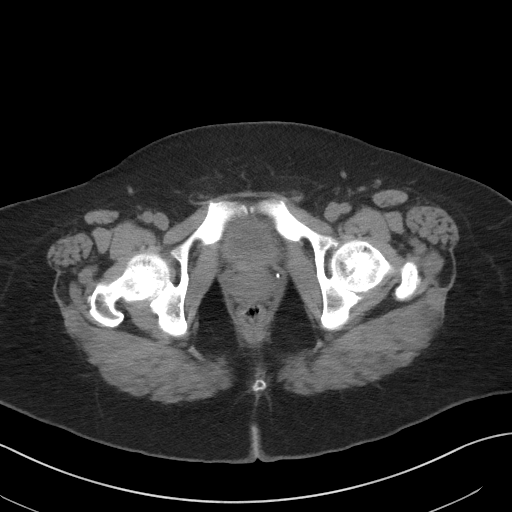
[im 32/99  soft-tissue]
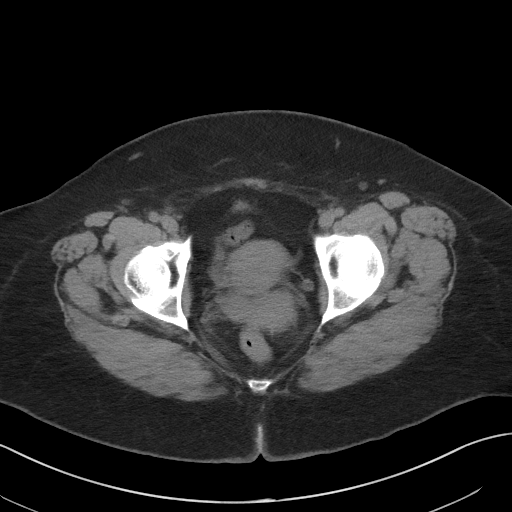
[im 40/99  soft-tissue]
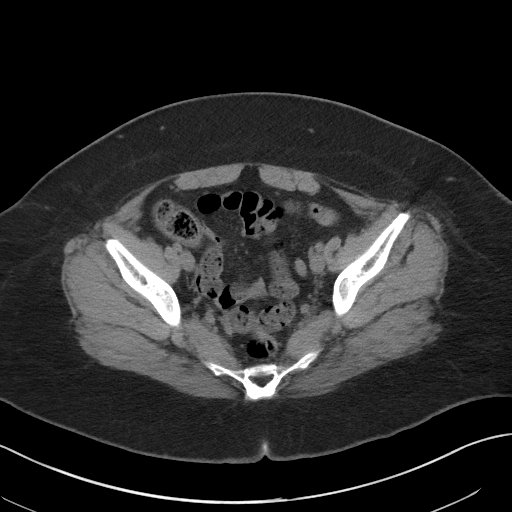
[im 48/99  soft-tissue]
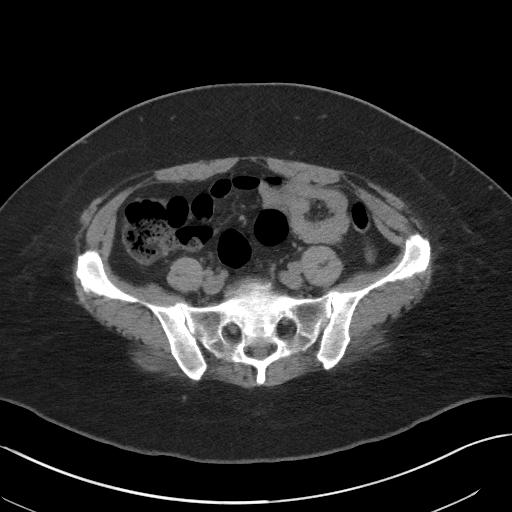
[im 51/99  soft-tissue]
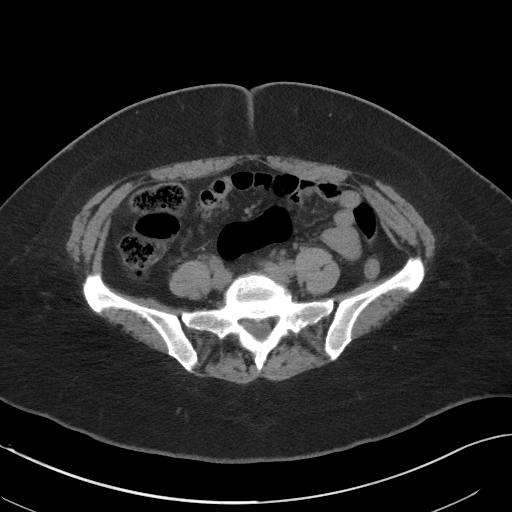
[im 59/99  soft-tissue]
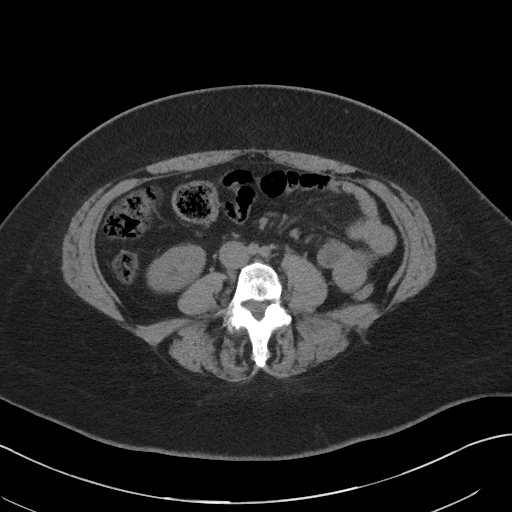
[im 59/99  bone]
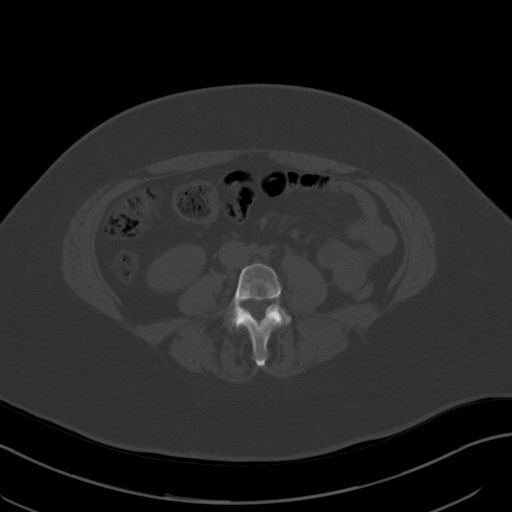
[im 67/99  soft-tissue]
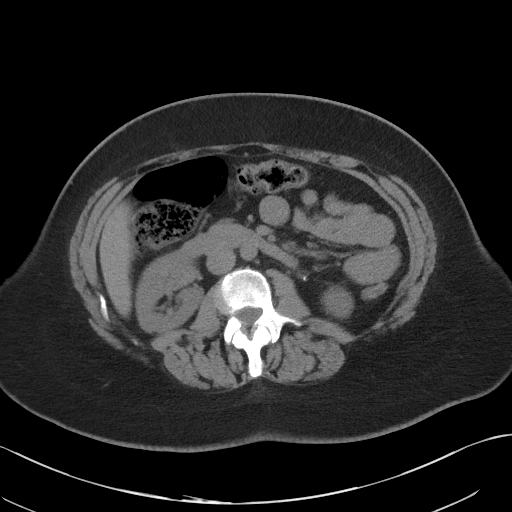
[im 75/99  soft-tissue]
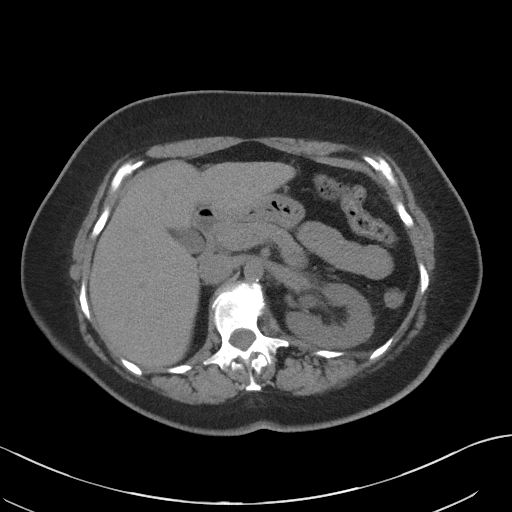
[im 79/99  soft-tissue]
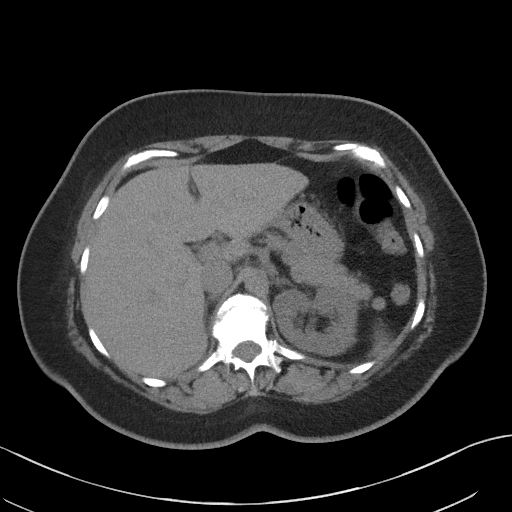
[im 87/99  soft-tissue]
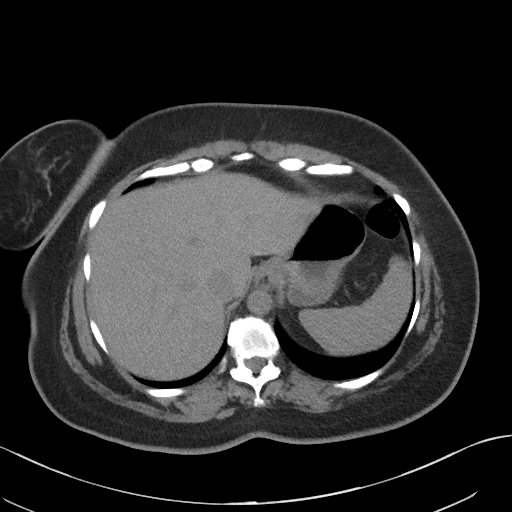
[im 95/99  soft-tissue]
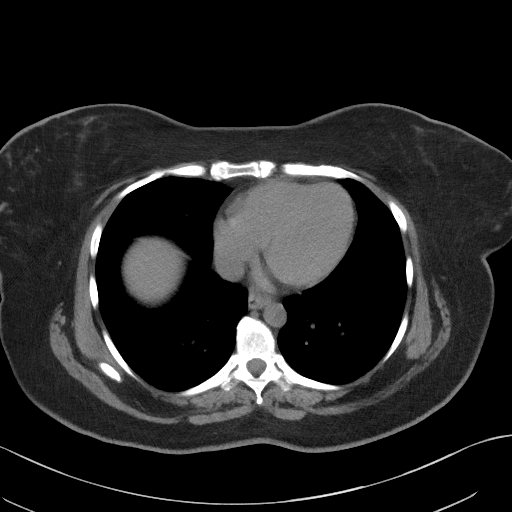

[Series 4: coronal st · coronal · 0.96mm/px · 3 of 95 slices shown]
[im 32/95  soft-tissue]
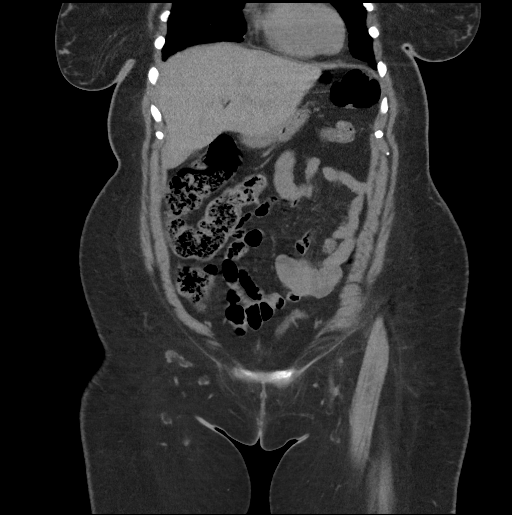
[im 42/95  soft-tissue]
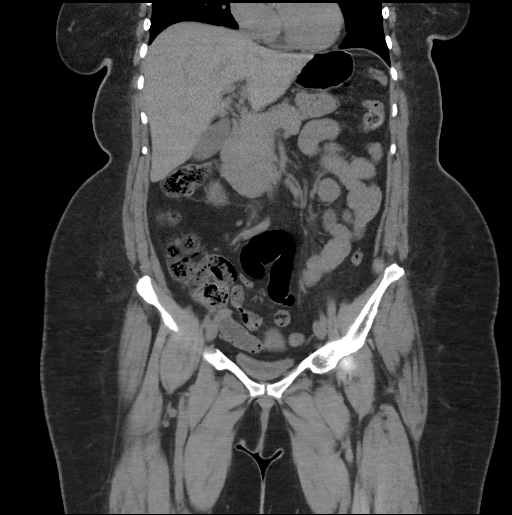
[im 53/95  soft-tissue]
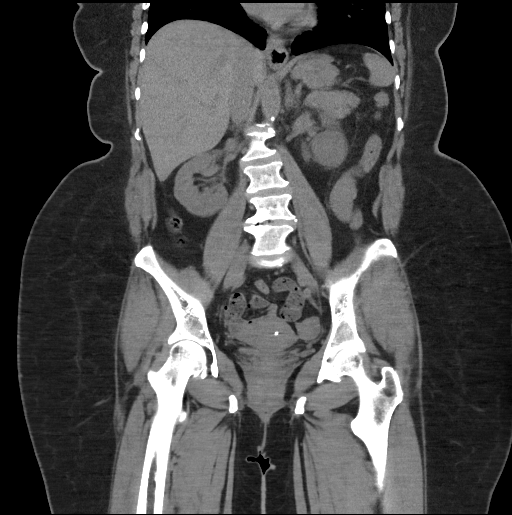

[17 of 46 positions shown; findings below may reference images not displayed]

FINDINGS: Lower chest: Unremarkable

Hepatobiliary: Mild dependent density in the gallbladder favoring
sludge or gallstones. Otherwise unremarkable.

Pancreas: Unremarkable

Spleen: Unremarkable

Adrenals/Urinary Tract: Mild left hydronephrosis due to a 3 mm in
long axis calculus in the proximal ureter just past the UPJ. The
ureter distal to this level is of normal caliber and no other
urinary tract calculi are identified.

Adrenal glands normal.

Stomach/Bowel: Unremarkable

Vascular/Lymphatic: Aortoiliac atherosclerotic vascular disease.

Reproductive: An IUD appears satisfactorily positioned centrally in
the uterus. 1.7 cm hypodense lesion along the right ovary, probably
a small cyst or follicle.

Other: No supplemental non-categorized findings.

Musculoskeletal: Lumbar spondylosis and degenerative disc disease
with degenerative endplate sclerosis notably at T12-L1 and L4-5, as
well as dextroconvex upper lumbar scoliosis. In conjunction with
spondylosis and degenerative disc disease there is resulting
impingement at L1-2, L3-4, L4-5, and L5-S1.
IMPRESSION: 1. Mild left hydronephrosis due to a 3 mm proximal ureteral calculus
on the left. No other urinary tract calculi observed.
2. Aortic Atherosclerosis ([id number]). Multilevel lumbar
impingement.
3. Sludge versus less likely gallstones in the gallbladder.

## 2019-06-07 ENCOUNTER — Telehealth: Payer: Self-pay

## 2019-06-07 NOTE — Telephone Encounter (Signed)
Call placed to Pt.  Scheduled Pt follow appt with Dr. Lovena Le to rediscuss aflutter ablation

## 2019-06-18 ENCOUNTER — Ambulatory Visit
Admission: RE | Admit: 2019-06-18 | Discharge: 2019-06-18 | Disposition: A | Discharge status: Home / Self Care | Payer: BC Managed Care – PPO | Source: Ambulatory Visit | Attending: Internal Medicine | Admitting: Internal Medicine

## 2019-06-18 ENCOUNTER — Other Ambulatory Visit: Payer: Self-pay

## 2019-06-18 DIAGNOSIS — Z1231 Encounter for screening mammogram for malignant neoplasm of breast: Secondary | ICD-10-CM

## 2019-06-19 DIAGNOSIS — L814 Other melanin hyperpigmentation: Secondary | ICD-10-CM | POA: Diagnosis not present

## 2019-06-19 DIAGNOSIS — D1801 Hemangioma of skin and subcutaneous tissue: Secondary | ICD-10-CM | POA: Diagnosis not present

## 2019-06-19 DIAGNOSIS — D225 Melanocytic nevi of trunk: Secondary | ICD-10-CM | POA: Diagnosis not present

## 2019-06-19 DIAGNOSIS — L821 Other seborrheic keratosis: Secondary | ICD-10-CM | POA: Diagnosis not present

## 2019-06-22 DIAGNOSIS — E119 Type 2 diabetes mellitus without complications: Secondary | ICD-10-CM | POA: Diagnosis not present

## 2019-06-26 ENCOUNTER — Ambulatory Visit: Payer: BC Managed Care – PPO | Admitting: Internal Medicine

## 2019-06-26 ENCOUNTER — Other Ambulatory Visit: Payer: Self-pay

## 2019-06-26 ENCOUNTER — Telehealth: Payer: Self-pay | Admitting: Internal Medicine

## 2019-06-26 ENCOUNTER — Encounter: Payer: Self-pay | Admitting: Internal Medicine

## 2019-06-26 DIAGNOSIS — I4892 Unspecified atrial flutter: Secondary | ICD-10-CM | POA: Insufficient documentation

## 2019-06-26 NOTE — H&P (View-Only) (Signed)
HPI Gail [name] returns today for ongoing evaluation and management of her atrial flutter with RVR. She initially had paroxysmal flutter but it appears to advanced to peristent atrial flutter. She feels palpitations. She has been on systemic anti-coagulation. She has not had syncope. She denies chest pain. She is interested in considering catheter ablation.  Allergies  Allergen Reactions  . Ace Inhibitors Cough  . Codeine Itching  . Erythromycin Ethylsuccinate     Stomach hurts REACTION: unspecified   can take azithromycin  . Doxycycline Rash  . Latex Rash     Current Outpatient Medications  Medication Sig Dispense Refill  . Cyanocobalamin (B-12) 1000 MCG SUBL Place under the tongue.    . diltiazem (DILT-XR) 240 MG 24 hr capsule TAKE 1 CAPSULE BY MOUTH DAILY 90 capsule 0  . fluticasone (FLONASE) 50 MCG/ACT nasal spray 2 sprays per nostril daily.  MUST HAVE OFFICE VISIT FOR FURTHER REFILLS. 16 g 0  . HYDROcodone-Acetaminophen 5-300 MG TABS Take 1 tablet by mouth every 6 (six) hours.    Marland Kitchen ibuprofen (ADVIL,MOTRIN) 600 MG tablet Take 1 tablet (600 mg total) by mouth every 6 (six) hours as needed. 30 tablet 0  . Lactobacillus (ACIDOPHILUS) 10 MG CAPS Take by mouth daily.     Marland Kitchen levonorgestrel (MIRENA) 20 MCG/24HR IUD 1 each by Intrauterine route once.      Marland Kitchen losartan (COZAAR) 50 MG tablet TAKE 1 TABLET(50 MG) BY MOUTH DAILY 90 tablet 0  . metFORMIN (GLUCOPHAGE-XR) 500 MG 24 hr tablet TAKE 2 TABLETS BY MOUTH EVERY DAY 180 tablet 0  . methocarbamol (ROBAXIN) 500 MG tablet Take 1-2 tablets (500-1,000 mg total) by mouth every 8 (eight) hours as needed for muscle spasms. Back pain 30 tablet 1  . nystatin ointment (MYCOSTATIN) Apply topically as directed 30 g 0  . predniSONE (DELTASONE) 20 MG tablet Take 3 po qd for 2 days then 2 po qd for 3 days,or as directed 12 tablet 0  . PROAIR HFA 108 (90 Base) MCG/ACT inhaler INHALE 2 PUFFS INTO THE LUNGS EVERY 6 HOURS AS NEEDED FOR WHEEZING OR  SHORTNESS OF BREATH 8.5 g 0  . RESTASIS 0.05 % ophthalmic emulsion     . rivaroxaban (XARELTO) 20 MG TABS tablet Take 1 tablet (20 mg total) by mouth daily with supper. 30 tablet 6  . Zinc Oxide 30.6 % CREA Apply topically as needed.      No current facility-administered medications for this visit.      Past Medical History:  Diagnosis Date  . Allergy    History of shots as a child  . Anal fissure   . Arthritis   . History of infertility, female   . Hyperlipidemia   . Hypertension   . IBS (irritable bowel syndrome)   . Microscopic hematuria 2017   urology evaluated no sig cause    . PCO (polycystic ovaries)   . Rosacea     ROS:   All systems reviewed and negative except as noted in the HPI.   Past Surgical History:  Procedure Laterality Date  . CARPAL TUNNEL RELEASE     x2 bilateral  . KNEE ARTHROSCOPY     left   . WISDOM TOOTH EXTRACTION    . wrist tendon release     bilateral thumbs     Family History  Problem Relation Age of Onset  . Hypertension Mother   . Heart disease Mother   . Hypertension Father   . ALS Father  deceased  . Dementia Maternal Grandfather   . Heart disease Paternal Grandmother   . Dementia Paternal Grandfather   . Hypertension Other   . Lung cancer Other        uncle  . Breast cancer Other        great aunt     Social History   Socioeconomic History  . Marital status: Married    Spouse name: Not on file  . Number of children: 0  . Years of education: Not on file  . Highest education level: Not on file  Occupational History  . Occupation: INFANT Product manager: [employer]  Social Needs  . Financial resource strain: Not on file  . Food insecurity    Worry: Not on file    Inability: Not on file  . Transportation needs    Medical: Not on file    Non-medical: Not on file  Tobacco Use  . Smoking status: Former Smoker    Packs/day: 0.50    Years: 7.00    Pack years: 3.50    Types: Cigarettes     Quit date: 12/13/1986    Years since quitting: 32.5  . Smokeless tobacco: Never Used  Substance and Sexual Activity  . Alcohol use: No    Alcohol/week: 0.0 standard drinks  . Drug use: No  . Sexual activity: Not on file    Comment: married  Lifestyle  . Physical activity    Days per week: Not on file    Minutes per session: Not on file  . Stress: Not on file  Relationships  . Social Herbalist on phone: Not on file    Gets together: Not on file    Attends religious service: Not on file    Active member of club or organization: Not on file    Attends meetings of clubs or organizations: Not on file    Relationship status: Not on file  . Intimate partner violence    Fear of current or ex partner: Not on file    Emotionally abused: Not on file    Physically abused: Not on file    Forced sexual activity: Not on file  Other Topics Concern  . 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     BP 130/78   Pulse (!) 101   Ht 5\' 1"  (1.549 m)   Wt 187 lb 9.6 oz (85.1 kg)   SpO2 98%   BMI 35.45 kg/m   Physical Exam:  Well appearing overweight middle aged woman,  NAD HEENT: Unremarkable Neck:  7 cm JVD, no thyromegally Lymphatics:  No adenopathy Back:  No CVA tenderness Lungs:  Clear HEART:  IRegular rate rhythm, no murmurs, no rubs, no clicks Abd:  soft, positive bowel sounds, no organomegally, no rebound, no guarding Ext:  2 plus pulses, no edema, no cyanosis, no clubbing Skin:  No rashes no nodules Neuro:  CN II through XII intact, motor grossly intact  EKG - atrial flutter with a RVR  Assess/Plan: 1. Atrial flutter with a RVR - I have discussed the treatment options with the patient and the risks/benefits/goals/expectations of EP study and catheter ablation were reviewed and she will  call us if she wishes to proceed. 2. HTN  - she has longstanding HTN. I encouraged her to maintain a low sodium diet and to try and lose weight.  Mikle Bosworth.D.

## 2019-06-26 NOTE — Telephone Encounter (Signed)
Spoke with patient. One person at work last week was having symptoms but did not come into the office.  She has been tested however results are still pending.  Another person was sent home with possible symptoms however patient was not around that person, her room was closed immediately and they all were masks all the time.  Pt denies any s/s.  Aware she will be screened prior to appt today when she arrives and her temperature will be taken.  She states understanding.  Pt will also notify the office if either one of her co-workers end up with positive results.

## 2019-06-26 NOTE — Patient Instructions (Addendum)
Medication Instructions:  Your physician recommends that you continue on your current medications as directed. Please refer to the Current Medication list given to you today.  Labwork: None ordered.  Testing/Procedures: Your physician has recommended that you have an ablation. Catheter ablation is a medical procedure used to treat some cardiac arrhythmias (irregular heartbeats). During catheter ablation, a long, thin, flexible tube is put into a blood vessel in your groin (upper thigh), or neck. This tube is called an ablation catheter. It is then guided to your heart through the blood vessel. Radio frequency waves destroy small areas of heart tissue where abnormal heartbeats may cause an arrhythmia to start. Please see the instruction sheet given to you today.  Days available for procedure in August:  3, 4, 17, 20, 26    Cardiac Ablation Cardiac ablation is a procedure to disable (ablate) a small amount of heart tissue in very specific places. The heart has many electrical connections. Sometimes these connections are abnormal and can cause the heart to beat very fast or irregularly. Ablating some of the problem areas can improve the heart rhythm or return it to normal. Ablation may be done for people who:  Have Wolff-Parkinson-White syndrome.  Have fast heart rhythms (tachycardia).  Have taken medicines for an abnormal heart rhythm (arrhythmia) that were not effective or caused side effects.  Have a high-risk heartbeat that may be life-threatening. During the procedure, a small incision is made in the neck or the groin, and a long, thin, flexible tube (catheter) is inserted into the incision and moved to the heart. Small devices (electrodes) on the tip of the catheter will send out electrical currents. A type of X-ray (fluoroscopy) will be used to help guide the catheter and to provide images of the heart. Tell a health care provider about:  Any allergies you have.  All medicines you are  taking, including vitamins, herbs, eye drops, creams, and over-the-counter medicines.  Any problems you or family members have had with anesthetic medicines.  Any blood disorders you have.  Any surgeries you have had.  Any medical conditions you have, such as kidney failure.  Whether you are pregnant or may be pregnant. What are the risks? Generally, this is a safe procedure. However, problems may occur, including:  Infection.  Bruising and bleeding at the catheter insertion site.  Bleeding into the chest, especially into the sac that surrounds the heart. This is a serious complication.  Stroke or blood clots.  Damage to other structures or organs.  Allergic reaction to medicines or dyes.  Need for a permanent pacemaker if the normal electrical system is damaged. A pacemaker is a small computer that sends electrical signals to the heart and helps your heart beat normally.  The procedure not being fully effective. This may not be recognized until months later. Repeat ablation procedures are sometimes required. What happens before the procedure?  Follow instructions from your health care provider about eating or drinking restrictions.  Ask your health care provider about: ? Changing or stopping your regular medicines. This is especially important if you are taking diabetes medicines or blood thinners. ? Taking medicines such as aspirin and ibuprofen. These medicines can thin your blood. Do not take these medicines before your procedure if your health care provider instructs you not to.  Plan to have someone take you home from the hospital or clinic.  If you will be going home right after the procedure, plan to have someone with you for 24 hours. What  happens during the procedure?  To lower your risk of infection: ? Your health care team will wash or sanitize their hands. ? Your skin will be washed with soap. ? Hair may be removed from the incision area.  An IV tube will  be inserted into one of your veins.  You will be given a medicine to help you relax (sedative).  The skin on your neck or groin will be numbed.  An incision will be made in your neck or your groin.  A needle will be inserted through the incision and into a large vein in your neck or groin.  A catheter will be inserted into the needle and moved to your heart.  Dye may be injected through the catheter to help your surgeon see the area of the heart that needs treatment.  Electrical currents will be sent from the catheter to ablate heart tissue in desired areas. There are three types of energy that may be used to ablate heart tissue: ? Heat (radiofrequency energy). ? Laser energy. ? Extreme cold (cryoablation).  When the necessary tissue has been ablated, the catheter will be removed.  Pressure will be held on the catheter insertion area to prevent excessive bleeding.  A bandage (dressing) will be placed over the catheter insertion area. The procedure may vary among health care providers and hospitals. What happens after the procedure?  Your blood pressure, heart rate, breathing rate, and blood oxygen level will be monitored until the medicines you were given have worn off.  Your catheter insertion area will be monitored for bleeding. You will need to lie still for a few hours to ensure that you do not bleed from the catheter insertion area.  Do not drive for 24 hours or as long as directed by your health care provider. Summary  Cardiac ablation is a procedure to disable (ablate) a small amount of heart tissue in very specific places. Ablating some of the problem areas can improve the heart rhythm or return it to normal.  During the procedure, electrical currents will be sent from the catheter to ablate heart tissue in desired areas. This information is not intended to replace advice given to you by your health care provider. Make sure you discuss any questions you have with your  health care provider. Document Released: 04/17/2009 Document Revised: 05/22/2018 Document Reviewed: 10/18/2016 Elsevier Patient Education  2020 [employer].

## 2019-06-26 NOTE — Progress Notes (Signed)
HPI Mrs. Perfetti returns today for ongoing evaluation and management of her atrial flutter with RVR. She initially had paroxysmal flutter but it appears to advanced to peristent atrial flutter. She feels palpitations. She has been on systemic anti-coagulation. She has not had syncope. She denies chest pain. She is interested in considering catheter ablation.  Allergies  Allergen Reactions  . Ace Inhibitors Cough  . Codeine Itching  . Erythromycin Ethylsuccinate     Stomach hurts REACTION: unspecified   can take azithromycin  . Doxycycline Rash  . Latex Rash     Current Outpatient Medications  Medication Sig Dispense Refill  . Cyanocobalamin (B-12) 1000 MCG SUBL Place under the tongue.    . diltiazem (DILT-XR) 240 MG 24 hr capsule TAKE 1 CAPSULE BY MOUTH DAILY 90 capsule 0  . fluticasone (FLONASE) 50 MCG/ACT nasal spray 2 sprays per nostril daily.  MUST HAVE OFFICE VISIT FOR FURTHER REFILLS. 16 g 0  . HYDROcodone-Acetaminophen 5-300 MG TABS Take 1 tablet by mouth every 6 (six) hours.    Marland Kitchen ibuprofen (ADVIL,MOTRIN) 600 MG tablet Take 1 tablet (600 mg total) by mouth every 6 (six) hours as needed. 30 tablet 0  . Lactobacillus (ACIDOPHILUS) 10 MG CAPS Take by mouth daily.     Marland Kitchen levonorgestrel (MIRENA) 20 MCG/24HR IUD 1 each by Intrauterine route once.      Marland Kitchen losartan (COZAAR) 50 MG tablet TAKE 1 TABLET(50 MG) BY MOUTH DAILY 90 tablet 0  . metFORMIN (GLUCOPHAGE-XR) 500 MG 24 hr tablet TAKE 2 TABLETS BY MOUTH EVERY DAY 180 tablet 0  . methocarbamol (ROBAXIN) 500 MG tablet Take 1-2 tablets (500-1,000 mg total) by mouth every 8 (eight) hours as needed for muscle spasms. Back pain 30 tablet 1  . nystatin ointment (MYCOSTATIN) Apply topically as directed 30 g 0  . predniSONE (DELTASONE) 20 MG tablet Take 3 po qd for 2 days then 2 po qd for 3 days,or as directed 12 tablet 0  . PROAIR HFA 108 (90 Base) MCG/ACT inhaler INHALE 2 PUFFS INTO THE LUNGS EVERY 6 HOURS AS NEEDED FOR WHEEZING OR  SHORTNESS OF BREATH 8.5 g 0  . RESTASIS 0.05 % ophthalmic emulsion     . rivaroxaban (XARELTO) 20 MG TABS tablet Take 1 tablet (20 mg total) by mouth daily with supper. 30 tablet 6  . Zinc Oxide 30.6 % CREA Apply topically as needed.      No current facility-administered medications for this visit.      Past Medical History:  Diagnosis Date  . Allergy    History of shots as a child  . Anal fissure   . Arthritis   . History of infertility, female   . Hyperlipidemia   . Hypertension   . IBS (irritable bowel syndrome)   . Microscopic hematuria 2017   urology evaluated no sig cause    . PCO (polycystic ovaries)   . Rosacea     ROS:   All systems reviewed and negative except as noted in the HPI.   Past Surgical History:  Procedure Laterality Date  . CARPAL TUNNEL RELEASE     x2 bilateral  . KNEE ARTHROSCOPY     left   . WISDOM TOOTH EXTRACTION    . wrist tendon release     bilateral thumbs     Family History  Problem Relation Age of Onset  . Hypertension Mother   . Heart disease Mother   . Hypertension Father   . ALS Father  deceased  . Dementia Maternal Grandfather   . Heart disease Paternal Grandmother   . Dementia Paternal Grandfather   . Hypertension Other   . Lung cancer Other        uncle  . Breast cancer Other        great aunt     Social History   Socioeconomic History  . Marital status: Married    Spouse name: Not on file  . Number of children: 0  . Years of education: Not on file  . Highest education level: Not on file  Occupational History  . Occupation: INFANT Product manager: [employer]  Social Needs  . Financial resource strain: Not on file  . Food insecurity    Worry: Not on file    Inability: Not on file  . Transportation needs    Medical: Not on file    Non-medical: Not on file  Tobacco Use  . Smoking status: Former Smoker    Packs/day: 0.50    Years: 7.00    Pack years: 3.50    Types: Cigarettes     Quit date: 12/13/1986    Years since quitting: 32.5  . Smokeless tobacco: Never Used  Substance and Sexual Activity  . Alcohol use: No    Alcohol/week: 0.0 standard drinks  . Drug use: No  . Sexual activity: Not on file    Comment: married  Lifestyle  . Physical activity    Days per week: Not on file    Minutes per session: Not on file  . Stress: Not on file  Relationships  . Social Herbalist on phone: Not on file    Gets together: Not on file    Attends religious service: Not on file    Active member of club or organization: Not on file    Attends meetings of clubs or organizations: Not on file    Relationship status: Not on file  . Intimate partner violence    Fear of current or ex partner: Not on file    Emotionally abused: Not on file    Physically abused: Not on file    Forced sexual activity: Not on file  Other Topics Concern  . 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     BP 130/78   Pulse (!) 101   Ht 5\' 1"  (1.549 m)   Wt 187 lb 9.6 oz (85.1 kg)   SpO2 98%   BMI 35.45 kg/m   Physical Exam:  Well appearing overweight middle aged woman,  NAD HEENT: Unremarkable Neck:  7 cm JVD, no thyromegally Lymphatics:  No adenopathy Back:  No CVA tenderness Lungs:  Clear HEART:  IRegular rate rhythm, no murmurs, no rubs, no clicks Abd:  soft, positive bowel sounds, no organomegally, no rebound, no guarding Ext:  2 plus pulses, no edema, no cyanosis, no clubbing Skin:  No rashes no nodules Neuro:  CN II through XII intact, motor grossly intact  EKG - atrial flutter with a RVR  Assess/Plan: 1. Atrial flutter with a RVR - I have discussed the treatment options with the patient and the risks/benefits/goals/expectations of EP study and catheter ablation were reviewed and she will  call us if she wishes to proceed. 2. HTN  - she has longstanding HTN. I encouraged her to maintain a low sodium diet and to try and lose weight.  Mikle Bosworth.D.

## 2019-06-26 NOTE — Telephone Encounter (Signed)
New Message        COVID-19 Pre-Screening Questions:   In the past 7 to 10 days have you had a cough,  shortness of breath, headache, congestion, fever (100 or greater) body aches, chills, sore throat, or sudden loss of taste or sense of smell? NO  Have you been around anyone with known Covid 19. NO  Have you been around anyone who is awaiting Covid 19 test results in the past 7 to 10 days? YES she said someone at work across the hall from her was sent home and is being tested  Have you been around anyone who has been exposed to Covid 19, or has mentioned symptoms of Covid 19 within the past 7 to 10 days? She said someone at work was experiencing symptoms, they didn't come In the building and was sent home, she said she doesn't think she was around them and they wear masks   If you have any concerns/questions about symptoms patients report during screening (either on the phone or at [facility]). Contact the provider seeing the patient or DOD for further guidance.  If neither are available contact a member of the leadership team.

## 2019-07-03 ENCOUNTER — Telehealth: Payer: Self-pay

## 2019-07-03 DIAGNOSIS — I4892 Unspecified atrial flutter: Secondary | ICD-10-CM

## 2019-07-03 NOTE — Telephone Encounter (Signed)
Pt scheduled for ablation on 07/16/2019 with Dr. Cranford Mon and covid test scheduled  Instruction letter complete and sent via mychart.  Work up complete

## 2019-07-13 ENCOUNTER — Other Ambulatory Visit: Payer: Self-pay

## 2019-07-13 ENCOUNTER — Telehealth: Payer: Self-pay | Admitting: Internal Medicine

## 2019-07-13 ENCOUNTER — Other Ambulatory Visit (HOSPITAL_COMMUNITY)
Admission: RE | Admit: 2019-07-13 | Discharge: 2019-07-13 | Disposition: A | Discharge status: Home / Self Care | Payer: BC Managed Care – PPO | Source: Ambulatory Visit | Attending: Internal Medicine | Admitting: Internal Medicine

## 2019-07-13 ENCOUNTER — Other Ambulatory Visit: Payer: BC Managed Care – PPO | Admitting: *Deleted

## 2019-07-13 DIAGNOSIS — Z01812 Encounter for preprocedural laboratory examination: Secondary | ICD-10-CM | POA: Insufficient documentation

## 2019-07-13 DIAGNOSIS — Z20828 Contact with and (suspected) exposure to other viral communicable diseases: Secondary | ICD-10-CM | POA: Diagnosis not present

## 2019-07-13 DIAGNOSIS — I4892 Unspecified atrial flutter: Secondary | ICD-10-CM

## 2019-07-13 LAB — SARS CORONAVIRUS 2 (TAT 6-24 HRS): SARS Coronavirus 2: NEGATIVE

## 2019-07-13 LAB — CBC WITH DIFFERENTIAL/PLATELET
Basophils Absolute: 0 10*3/uL (ref 0.0–0.2)
Basos: 1 %
EOS (ABSOLUTE): 0.2 10*3/uL (ref 0.0–0.4)
Eos: 3 %
Hematocrit: 42.8 % (ref 34.0–46.6)
Hemoglobin: 14.4 g/dL (ref 11.1–15.9)
Immature Grans (Abs): 0 10*3/uL (ref 0.0–0.1)
Immature Granulocytes: 0 %
Lymphocytes Absolute: 1.5 10*3/uL (ref 0.7–3.1)
Lymphs: 29 %
MCH: 31.8 pg (ref 26.6–33.0)
MCHC: 33.6 g/dL (ref 31.5–35.7)
MCV: 95 fL (ref 79–97)
Monocytes Absolute: 0.6 10*3/uL (ref 0.1–0.9)
Monocytes: 10 %
Neutrophils Absolute: 3.1 10*3/uL (ref 1.4–7.0)
Neutrophils: 57 %
Platelets: 226 10*3/uL (ref 150–450)
RBC: 4.53 x10E6/uL (ref 3.77–5.28)
RDW: 12.1 % (ref 11.7–15.4)
WBC: 5.4 10*3/uL (ref 3.4–10.8)

## 2019-07-13 LAB — BASIC METABOLIC PANEL
BUN/Creatinine Ratio: 14 (ref 9–23)
BUN: 12 mg/dL (ref 6–24)
CO2: 22 mmol/L (ref 20–29)
Calcium: 9.5 mg/dL (ref 8.7–10.2)
Chloride: 102 mmol/L (ref 96–106)
Creatinine, Ser: 0.83 mg/dL (ref 0.57–1.00)
GFR calc Af Amer: 95 mL/min/{1.73_m2} (ref >59)
GFR calc non Af Amer: 82 mL/min/{1.73_m2} (ref >59)
Glucose: 97 mg/dL (ref 65–99)
Potassium: 4.4 mmol/L (ref 3.5–5.2)
Sodium: 139 mmol/L (ref 134–144)

## 2019-07-13 NOTE — Telephone Encounter (Signed)
New message    Pt has some questions about upcoming ablation. She said she was told not to take diltiazem 2 days before procedure, so that would be Saturday correct? She also would like to make sure she can take all her other medications up until the day of the procedure. She also wants to know since she is going to get the Covid test today, when will the results be in for that?

## 2019-07-16 ENCOUNTER — Ambulatory Visit (HOSPITAL_COMMUNITY)
Admission: RE | Disposition: A | Discharge status: Home / Self Care | Payer: Self-pay | Source: Home / Self Care | Attending: Internal Medicine

## 2019-07-16 ENCOUNTER — Other Ambulatory Visit: Payer: Self-pay

## 2019-07-16 ENCOUNTER — Ambulatory Visit (HOSPITAL_COMMUNITY)
Admission: RE | Admit: 2019-07-16 | Discharge: 2019-07-16 | Disposition: A | Discharge status: Home / Self Care | Payer: BC Managed Care – PPO | Attending: Internal Medicine | Admitting: Internal Medicine

## 2019-07-16 DIAGNOSIS — I471 Supraventricular tachycardia: Secondary | ICD-10-CM | POA: Insufficient documentation

## 2019-07-16 DIAGNOSIS — Z87891 Personal history of nicotine dependence: Secondary | ICD-10-CM | POA: Diagnosis not present

## 2019-07-16 DIAGNOSIS — Z91040 Latex allergy status: Secondary | ICD-10-CM | POA: Diagnosis not present

## 2019-07-16 DIAGNOSIS — Z793 Long term (current) use of hormonal contraceptives: Secondary | ICD-10-CM | POA: Insufficient documentation

## 2019-07-16 DIAGNOSIS — Z79899 Other long term (current) drug therapy: Secondary | ICD-10-CM | POA: Insufficient documentation

## 2019-07-16 DIAGNOSIS — Z7984 Long term (current) use of oral hypoglycemic drugs: Secondary | ICD-10-CM | POA: Diagnosis not present

## 2019-07-16 DIAGNOSIS — E282 Polycystic ovarian syndrome: Secondary | ICD-10-CM | POA: Diagnosis not present

## 2019-07-16 DIAGNOSIS — E785 Hyperlipidemia, unspecified: Secondary | ICD-10-CM | POA: Insufficient documentation

## 2019-07-16 DIAGNOSIS — Z7901 Long term (current) use of anticoagulants: Secondary | ICD-10-CM | POA: Insufficient documentation

## 2019-07-16 DIAGNOSIS — I10 Essential (primary) hypertension: Secondary | ICD-10-CM | POA: Diagnosis not present

## 2019-07-16 DIAGNOSIS — Z8249 Family history of ischemic heart disease and other diseases of the circulatory system: Secondary | ICD-10-CM | POA: Insufficient documentation

## 2019-07-16 DIAGNOSIS — M1990 Unspecified osteoarthritis, unspecified site: Secondary | ICD-10-CM | POA: Insufficient documentation

## 2019-07-16 DIAGNOSIS — K589 Irritable bowel syndrome without diarrhea: Secondary | ICD-10-CM | POA: Insufficient documentation

## 2019-07-16 DIAGNOSIS — Z885 Allergy status to narcotic agent status: Secondary | ICD-10-CM | POA: Diagnosis not present

## 2019-07-16 DIAGNOSIS — I4892 Unspecified atrial flutter: Secondary | ICD-10-CM | POA: Insufficient documentation

## 2019-07-16 DIAGNOSIS — Z881 Allergy status to other antibiotic agents status: Secondary | ICD-10-CM | POA: Diagnosis not present

## 2019-07-16 HISTORY — PX: ATRIAL TACH ABLATION: EP1192

## 2019-07-16 HISTORY — PX: A-FLUTTER ABLATION: EP1230

## 2019-07-16 LAB — PREGNANCY, URINE: Preg Test, Ur: NEGATIVE

## 2019-07-16 LAB — GLUCOSE, CAPILLARY: Glucose-Capillary: 101 mg/dL — ABNORMAL HIGH (ref 70–99)

## 2019-07-16 SURGERY — A-FLUTTER ABLATION
Anesthesia: LOCAL

## 2019-07-16 MED ORDER — MIDAZOLAM HCL 5 MG/5ML IJ SOLN
INTRAMUSCULAR | Status: AC
  Filled 2019-07-16: qty 5

## 2019-07-16 MED ORDER — MIDAZOLAM HCL 5 MG/5ML IJ SOLN
INTRAMUSCULAR | Status: DC | PRN
  Administered 2019-07-16 (×2): 1 mg via INTRAVENOUS
  Administered 2019-07-16 (×2): 2 mg via INTRAVENOUS
  Administered 2019-07-16 (×2): 1 mg via INTRAVENOUS
  Administered 2019-07-16: 2 mg via INTRAVENOUS
  Administered 2019-07-16 (×3): 1 mg via INTRAVENOUS
  Administered 2019-07-16: 2 mg via INTRAVENOUS
  Administered 2019-07-16: 1 mg via INTRAVENOUS
  Administered 2019-07-16: 2 mg via INTRAVENOUS
  Administered 2019-07-16 (×6): 1 mg via INTRAVENOUS

## 2019-07-16 MED ORDER — BUPIVACAINE HCL (PF) 0.25 % IJ SOLN
INTRAMUSCULAR | Status: AC
  Filled 2019-07-16: qty 60

## 2019-07-16 MED ORDER — HYDROCODONE-ACETAMINOPHEN 5-325 MG PO TABS
1.0000 | ORAL_TABLET | ORAL | Status: DC | PRN
  Administered 2019-07-16: 1 via ORAL
  Filled 2019-07-16: qty 1

## 2019-07-16 MED ORDER — FENTANYL CITRATE (PF) 100 MCG/2ML IJ SOLN
INTRAMUSCULAR | Status: AC
  Filled 2019-07-16: qty 2

## 2019-07-16 MED ORDER — SODIUM CHLORIDE 0.9 % IV SOLN
INTRAVENOUS | Status: DC
  Administered 2019-07-16: 07:00:00 via INTRAVENOUS

## 2019-07-16 MED ORDER — HEPARIN (PORCINE) IN NACL 1000-0.9 UT/500ML-% IV SOLN
INTRAVENOUS | Status: AC
  Filled 2019-07-16: qty 500

## 2019-07-16 MED ORDER — ONDANSETRON HCL 4 MG/2ML IJ SOLN: 4.0000 mg | Freq: Four times a day (QID) | INTRAMUSCULAR | Status: DC | PRN

## 2019-07-16 MED ORDER — SODIUM CHLORIDE 0.9% FLUSH: 3.0000 mL | INTRAVENOUS | Status: DC | PRN

## 2019-07-16 MED ORDER — SODIUM CHLORIDE 0.9 % IV SOLN: 250.0000 mL | INTRAVENOUS | Status: DC | PRN

## 2019-07-16 MED ORDER — BUPIVACAINE HCL (PF) 0.25 % IJ SOLN
INTRAMUSCULAR | Status: DC | PRN
  Administered 2019-07-16: 45 mL

## 2019-07-16 MED ORDER — SODIUM CHLORIDE 0.9% FLUSH: 3.0000 mL | Freq: Two times a day (BID) | INTRAVENOUS | Status: DC

## 2019-07-16 MED ORDER — HEPARIN (PORCINE) IN NACL 1000-0.9 UT/500ML-% IV SOLN
INTRAVENOUS | Status: DC | PRN
  Administered 2019-07-16: 500 mL

## 2019-07-16 MED ORDER — FENTANYL CITRATE (PF) 100 MCG/2ML IJ SOLN
INTRAMUSCULAR | Status: DC | PRN
  Administered 2019-07-16: 25 ug via INTRAVENOUS
  Administered 2019-07-16 (×3): 12.5 ug via INTRAVENOUS
  Administered 2019-07-16: 25 ug via INTRAVENOUS
  Administered 2019-07-16 (×2): 12.5 ug via INTRAVENOUS
  Administered 2019-07-16: 25 ug via INTRAVENOUS
  Administered 2019-07-16 (×2): 12.5 ug via INTRAVENOUS
  Administered 2019-07-16: 25 ug via INTRAVENOUS
  Administered 2019-07-16: 12.5 ug via INTRAVENOUS
  Administered 2019-07-16 (×2): 25 ug via INTRAVENOUS
  Administered 2019-07-16 (×4): 12.5 ug via INTRAVENOUS

## 2019-07-16 MED ORDER — ACETAMINOPHEN 325 MG PO TABS: 650.0000 mg | ORAL_TABLET | ORAL | Status: DC | PRN

## 2019-07-16 SURGICAL SUPPLY — 14 items
BAG SNAP BAND KOVER 36X36 (MISCELLANEOUS) ×1 IMPLANT
CATH BLAZERPRIME XP (ABLATOR) ×1 IMPLANT
CATH DUODECA HALO/ISMUS 7FR (CATHETERS) ×1 IMPLANT
CATH EZ STEER NAV 4MM D-F CUR (ABLATOR) ×1 IMPLANT
CATH EZ STEER NAV 8MM F-J CUR (ABLATOR) ×1 IMPLANT
CATH JOSEPH QUAD ALLRED 6F REP (CATHETERS) ×1 IMPLANT
CATH WEBSTER BI DIR CS D-F CRV (CATHETERS) ×1 IMPLANT
PACK EP LATEX FREE (CUSTOM PROCEDURE TRAY) ×1
PACK EP LF (CUSTOM PROCEDURE TRAY) ×1 IMPLANT
PAD PRO RADIOLUCENT 2001M-C (PAD) ×2 IMPLANT
PATCH CARTO3 (PAD) ×1 IMPLANT
SHEATH PINNACLE 6F 10CM (SHEATH) ×1 IMPLANT
SHEATH PINNACLE 7F 10CM (SHEATH) ×1 IMPLANT
SHEATH PINNACLE 8F 10CM (SHEATH) ×2 IMPLANT

## 2019-07-16 NOTE — Interval H&P Note (Signed)
History and Physical Interval Note:  07/16/2019 7:44 AM  Gail [name]  has presented today for surgery, with the diagnosis of AFlutter.  The various methods of treatment have been discussed with the patient and family. After consideration of risks, benefits and other options for treatment, the patient has consented to  Procedure(s): A-FLUTTER ABLATION (N/A) as a surgical intervention.  The patient's history has been reviewed, patient examined, no change in status, stable for surgery.  I have reviewed the patient's chart and labs.  Questions were answered to the patient's satisfaction.     [name]

## 2019-07-16 NOTE — Discharge Instructions (Signed)
Post ablation, Care After This sheet gives you information about how to care for yourself after your procedure. Your health care provider may also give you more specific instructions. If you have problems or questions, contact your health care provider. What can I expect after the procedure? After the procedure, it is common to have bruising and tenderness at the catheter insertion area. Follow these instructions at home: Insertion site care  Follow instructions from your health care provider about how to take care of your insertion site. Make sure you: ? Wash your hands with soap and water before you change your bandage (dressing). If soap and water are not available, use hand sanitizer. ? Change your dressing as told by your health care provider. ? Leave stitches (sutures), skin glue, or adhesive strips in place. These skin closures may need to stay in place for 2 weeks or longer. If adhesive strip edges start to loosen and curl up, you may trim the loose edges. Do not remove adhesive strips completely unless your health care provider tells you to do that.  Do not take baths, swim, or use a hot tub until your health care provider approves.  You may shower 24-48 hours after the procedure or as told by your health care provider. ? Gently wash the site with plain soap and water. ? Pat the area dry with a clean towel. ? Do not rub the site. This may cause bleeding.  Do not apply powder or lotion to the site. Keep the site clean and dry.  Check your insertion site every day for signs of infection. Check for: ? Redness, swelling, or pain. ? Fluid or blood. ? Warmth. ? Pus or a bad smell. Activity  Rest as told by your health care provider, usually for 1-2 days.  Do not lift anything that is heavier than 10 lbs. (4.5 kg) or as told by your health care provider.  Do not drive for 24 hours if you were given a medicine to help you relax (sedative).  Do not drive or use heavy machinery while  taking prescription pain medicine. General instructions   Return to your normal activities as told by your health care provider, usually in about a week. Ask your health care provider what activities are safe for you.  If the catheter site starts bleeding, lie flat and put pressure on the site. If the bleeding does not stop, get help right away. This is a medical emergency.  Drink enough fluid to keep your urine clear or pale yellow. This helps flush the contrast dye from your body.  Take over-the-counter and prescription medicines only as told by your health care provider.  Keep all follow-up visits as told by your health care provider. This is important. Contact a health care provider if:  You have a fever or chills.  You have redness, swelling, or pain around your insertion site.  You have fluid or blood coming from your insertion site.  The insertion site feels warm to the touch.  You have pus or a bad smell coming from your insertion site.  You have bruising around the insertion site.  You notice blood collecting in the tissue around the catheter site (hematoma). The hematoma may be painful to the touch. Get help right away if:  You have severe pain at the catheter insertion area.  The catheter insertion area swells very fast.  The catheter insertion area is bleeding, and the bleeding does not stop when you hold steady pressure on the  area.  The area near or just beyond the catheter insertion site becomes pale, cool, tingly, or numb. These symptoms may represent a serious problem that is an emergency. Do not wait to see if the symptoms will go away. Get medical help right away. Call your local emergency services (911 in the [place] Do not drive yourself to the hospital. Summary  After the procedure, it is common to have bruising and tenderness at the catheter insertion area.  After the procedure, it is important to rest and drink plenty of fluids.  Do not take baths,  swim, or use a hot tub until your health care provider says it is okay to do so. You may shower 24-48 hours after the procedure or as told by your health care provider.  If the catheter site starts bleeding, lie flat and put pressure on the site. If the bleeding does not stop, get help right away. This is a medical emergency. This information is not intended to replace advice given to you by your health care provider. Make sure you discuss any questions you have with your health care provider. Document Released: 06/17/2005 Document Revised: 11/11/2017 Document Reviewed: 11/03/2016 Elsevier Patient Education  2020 [employer].

## 2019-07-16 NOTE — Progress Notes (Signed)
Site area: rt groin fv sheaths x3 Site Prior to Removal:  Level 0 Pressure Applied For: 20 minutes Manual:   yes Patient Status During Pull:  stable Post Pull Site:  Level 0 Post Pull Instructions Given:  yes Post Pull Pulses Present: rt dp palpable Dressing Applied:  Gauze and tegaderm Bedrest begins @ 1100 Comments:  IV saline locked

## 2019-07-16 NOTE — Progress Notes (Addendum)
Pt has remained tachycardiac since arrival to post short stay, AndyT. PA for EP was paged/ call returned, updated with vitals. No orders followed. 1630, pt to restart xarelto tomorrow morning per Dr Lovena Le

## 2019-07-17 ENCOUNTER — Encounter (HOSPITAL_COMMUNITY): Payer: Self-pay | Admitting: Internal Medicine

## 2019-07-18 MED FILL — Fentanyl Citrate Preservative Free (PF) Inj 100 MCG/2ML: INTRAMUSCULAR | Qty: 2 | Status: AC

## 2019-07-20 ENCOUNTER — Other Ambulatory Visit: Payer: Self-pay | Admitting: Internal Medicine

## 2019-07-30 ENCOUNTER — Other Ambulatory Visit: Payer: Self-pay | Admitting: Internal Medicine

## 2019-07-30 NOTE — Telephone Encounter (Signed)
31f 85.1kg Scr 0.83 07/13/19 ccr 136mlmin Lovw/taylor 06/26/19

## 2019-07-31 DIAGNOSIS — Z1211 Encounter for screening for malignant neoplasm of colon: Secondary | ICD-10-CM

## 2019-08-01 NOTE — Telephone Encounter (Signed)
Please refer her to [facility]  For routine colonoscopy screening  With request to be done beore October 29th thanks

## 2019-08-02 ENCOUNTER — Telehealth: Payer: BC Managed Care – PPO | Admitting: Family Medicine

## 2019-08-02 NOTE — Telephone Encounter (Signed)
Pt scheduled with virtual appt with Dr.Fry

## 2019-08-07 DIAGNOSIS — M25522 Pain in left elbow: Secondary | ICD-10-CM | POA: Diagnosis not present

## 2019-08-14 ENCOUNTER — Other Ambulatory Visit: Payer: Self-pay

## 2019-08-14 ENCOUNTER — Ambulatory Visit (INDEPENDENT_AMBULATORY_CARE_PROVIDER_SITE_OTHER): Payer: BC Managed Care – PPO | Admitting: Internal Medicine

## 2019-08-14 ENCOUNTER — Encounter: Payer: Self-pay | Admitting: Internal Medicine

## 2019-08-14 ENCOUNTER — Telehealth: Payer: Self-pay | Admitting: Radiology

## 2019-08-14 VITALS — BP 110/68 | HR 96 | Ht 60.0 in | Wt 186.0 lb

## 2019-08-14 DIAGNOSIS — I4892 Unspecified atrial flutter: Secondary | ICD-10-CM

## 2019-08-14 DIAGNOSIS — R002 Palpitations: Secondary | ICD-10-CM | POA: Diagnosis not present

## 2019-08-14 NOTE — Telephone Encounter (Signed)
Enrolled patient for a 30 day [employer] Event monitor to be mailed. Brief instructions were gone over with the patient and she knows to expect the monitor to arrive in 3-4 days.  

## 2019-08-14 NOTE — Patient Instructions (Addendum)
Medication Instructions:  Your physician recommends that you continue on your current medications as directed. Please refer to the Current Medication list given to you today.  Labwork: None ordered.  Testing/Procedures: Your physician has recommended that you wear a holter monitor. Holter monitors are medical devices that record the heart's electrical activity. Doctors most often use these monitors to diagnose arrhythmias. Arrhythmias are problems with the speed or rhythm of the heartbeat. The monitor is a small, portable device. You can wear one while you do your normal daily activities. This is usually used to diagnose what is causing palpitations/syncope (passing out).  Please schedule for a 30 day monitor  Follow-Up: Your physician wants you to follow-up in: 6 months with Dr. Lovena Le.   You will receive a reminder letter in the mail two months in advance. If you don't receive a letter, please call our office to schedule the follow-up appointment.  Any Other Special Instructions Will Be Listed Below (If Applicable).  If you need a refill on your cardiac medications before your next appointment, please call your pharmacy.

## 2019-08-14 NOTE — Progress Notes (Signed)
HPI Gail [name] returns today after undergoing EPS/RFA of atrial flutter and atrial tachycardia. She has had a couple of falls since I saw her last. She has had minimal palpitations. She denies chest pain or sob. No syncope. No edema.  Allergies  Allergen Reactions  . Ace Inhibitors Cough  . Erythromycin Ethylsuccinate     Stomach hurts REACTION: unspecified   can take azithromycin  . Doxycycline Rash  . Latex Rash     Current Outpatient Medications  Medication Sig Dispense Refill  . diltiazem (DILT-XR) 240 MG 24 hr capsule TAKE 1 CAPSULE BY MOUTH DAILY 90 capsule 0  . fluticasone (FLONASE) 50 MCG/ACT nasal spray 2 sprays per nostril daily.  MUST HAVE OFFICE VISIT FOR FURTHER REFILLS. (Patient taking differently: Place 2 sprays into both nostrils at bedtime. 2 sprays per nostril daily.  MUST HAVE OFFICE VISIT FOR FURTHER REFILLS.) 16 g 0  . Lactobacillus (ACIDOPHILUS PO) Take 1 capsule by mouth at bedtime.     Marland Kitchen levonorgestrel (MIRENA) 20 MCG/24HR IUD 1 each by Intrauterine route once.      Marland Kitchen losartan (COZAAR) 50 MG tablet TAKE 1 TABLET(50 MG) BY MOUTH DAILY (Patient taking differently: Take 50 mg by mouth daily. ) 90 tablet 0  . metFORMIN (GLUCOPHAGE-XR) 500 MG 24 hr tablet TAKE 2 TABLETS BY MOUTH EVERY DAY 180 tablet 0  . nystatin ointment (MYCOSTATIN) Apply topically as directed (Patient taking differently: Apply 1 application topically every morning. Apply topically as directed) 30 g 0  . RESTASIS 0.05 % ophthalmic emulsion Place 1 drop into both eyes every morning.     . vitamin B-12 (CYANOCOBALAMIN) 500 MCG tablet Take 1,000 mcg by mouth daily.    [name] 20 MG TABS tablet TAKE 1 TABLET(20 MG) BY MOUTH DAILY WITH SUPPER 30 tablet 6  . Zinc Oxide 30.6 % CREA Apply 1 application topically every morning.      No current facility-administered medications for this visit.      Past Medical History:  Diagnosis Date  . Allergy    History of shots as a child  . Anal  fissure   . Arthritis   . History of infertility, female   . Hyperlipidemia   . Hypertension   . IBS (irritable bowel syndrome)   . Microscopic hematuria 2017   urology evaluated no sig cause    . PCO (polycystic ovaries)   . Rosacea     ROS:   All systems reviewed and negative except as noted in the HPI.   Past Surgical History:  Procedure Laterality Date  . A-FLUTTER ABLATION N/A 07/16/2019   Procedure: A-FLUTTER ABLATION;  Surgeon: Evans Lance, MD;  Location: [facility];  Service: Cardiovascular;  Laterality: N/A;  . ATRIAL TACH ABLATION N/A 07/16/2019   Procedure: ATRIAL TACH ABLATION;  Surgeon: Evans Lance, MD;  Location: [facility];  Service: Cardiovascular;  Laterality: N/A;  . CARPAL TUNNEL RELEASE     x2 bilateral  . KNEE ARTHROSCOPY     left   . WISDOM TOOTH EXTRACTION    . wrist tendon release     bilateral thumbs     Family History  Problem Relation Age of Onset  . Hypertension Mother   . Heart disease Mother   . Hypertension Father   . ALS Father        deceased  . Dementia Maternal Grandfather   . Heart disease Paternal Grandmother   . Dementia Paternal  Grandfather   . Hypertension Other   . Lung cancer Other        uncle  . Breast cancer Other        great aunt     Social History   Socioeconomic History  . Marital status: Married    Spouse name: Not on file  . Number of children: 0  . Years of education: Not on file  . Highest education level: Not on file  Occupational History  . Occupation: INFANT Product manager: [employer]  Social Needs  . Financial resource strain: Not on file  . Food insecurity    Worry: Not on file    Inability: Not on file  . Transportation needs    Medical: Not on file    Non-medical: Not on file  Tobacco Use  . Smoking status: Former Smoker    Packs/day: 0.50    Years: 7.00    Pack years: 3.50    Types: Cigarettes    Quit date: 12/13/1986    Years since quitting:  32.6  . Smokeless tobacco: Never Used  Substance and Sexual Activity  . Alcohol use: No    Alcohol/week: 0.0 standard drinks  . Drug use: No  . Sexual activity: Not on file    Comment: married  Lifestyle  . Physical activity    Days per week: Not on file    Minutes per session: Not on file  . Stress: Not on file  Relationships  . Social Herbalist on phone: Not on file    Gets together: Not on file    Attends religious service: Not on file    Active member of club or organization: Not on file    Attends meetings of clubs or organizations: Not on file    Relationship status: Not on file  . Intimate partner violence    Fear of current or ex partner: Not on file    Emotionally abused: Not on file    Physically abused: Not on file    Forced sexual activity: Not on file  Other Topics Concern  . 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     BP 110/68   Pulse 96   Ht 5' (1.524 m)   Wt 186 lb (84.4 kg)   SpO2 98%   BMI 36.33 kg/m   Physical Exam:  Well appearing but overweight middle aged woman, NAD HEENT: Unremarkable Neck:  No JVD, no thyromegally Lymphatics:  No adenopathy Back:  No CVA tenderness Lungs:  Clear with no wheezes HEART:  Regular rate rhythm, no murmurs, no rubs, no clicks Abd:  soft, positive bowel sounds, no organomegally, no rebound, no guarding Ext:  2 plus pulses, no edema, no cyanosis, no clubbing Skin:  No rashes no nodules Neuro:  CN II through XII intact, motor grossly intact  EKG - NSR  Assess/Plan: 1. Atrial flutter - she appears to be maintaining NSR after ablation. I might want to stop her anti-coagulation if no atrial fib. We will ask her to wear a 28 day monitor. 2. Atrial tachycardia - this was incessant when I saw her last.  3. Obesity - I have  strongly encouraged  her to lose a pound a month. 4. HTN - her bp is well controlled. We will follow.   Mikle Bosworth.D.

## 2019-08-18 ENCOUNTER — Ambulatory Visit (INDEPENDENT_AMBULATORY_CARE_PROVIDER_SITE_OTHER): Payer: BC Managed Care – PPO

## 2019-08-18 ENCOUNTER — Encounter: Payer: Self-pay | Admitting: Internal Medicine

## 2019-08-18 DIAGNOSIS — R002 Palpitations: Secondary | ICD-10-CM | POA: Diagnosis not present

## 2019-08-20 ENCOUNTER — Telehealth: Payer: Self-pay | Admitting: Cardiology

## 2019-08-20 NOTE — Telephone Encounter (Signed)
Received page from [employer] home monitoring.   Patient wearing monitor to evaluate for any recurrent afib/flutter after recent ablation. Per report, patient had afib with rate of 90 bpm. Finished in SR at HR of 90 bpm. Afib lasted for approximately 15 seconds. Patient without symptoms during the episode.   A copy of this note will be forwarded to Dr. Lovena Le, patient's primary electrophysiologist.  Bryna Colander, MD

## 2019-08-21 ENCOUNTER — Telehealth: Payer: Self-pay

## 2019-08-21 ENCOUNTER — Other Ambulatory Visit: Payer: Self-pay | Admitting: Internal Medicine

## 2019-08-21 NOTE — Telephone Encounter (Signed)
Per Dr Lovena Le, possible small amount of AF noted on Monitor alert dated for 9/07 @ 1242AM. Continue to monitor.

## 2019-09-10 ENCOUNTER — Encounter: Payer: Self-pay | Admitting: Gastroenterology

## 2019-09-10 ENCOUNTER — Other Ambulatory Visit: Payer: Self-pay

## 2019-09-10 ENCOUNTER — Ambulatory Visit (INDEPENDENT_AMBULATORY_CARE_PROVIDER_SITE_OTHER): Payer: BC Managed Care – PPO | Admitting: Gastroenterology

## 2019-09-10 VITALS — BP 126/78 | HR 78 | Temp 97.7°F | Ht 60.0 in | Wt 188.5 lb

## 2019-09-10 DIAGNOSIS — Z7901 Long term (current) use of anticoagulants: Secondary | ICD-10-CM | POA: Diagnosis not present

## 2019-09-10 DIAGNOSIS — Z1211 Encounter for screening for malignant neoplasm of colon: Secondary | ICD-10-CM | POA: Diagnosis not present

## 2019-09-10 DIAGNOSIS — R140 Abdominal distension (gaseous): Secondary | ICD-10-CM

## 2019-09-10 NOTE — Patient Instructions (Addendum)
It has been recommended to you by your physician that you have a(n) colonoscopy completed. Per your request, we did not schedule the procedure(s) today. Please contact our office at [phone] should you decide to have the procedure completed. You will be scheduled for a Preop appointment at that time    Take IBGard 1 capsule three times a day as needed  If you are age 51 or older, your body mass index should be between 23-30. Your Body mass index is 36.81 kg/m. If this is out of the aforementioned range listed, please consider follow up with your Primary Care Provider.  If you are age 60 or younger, your body mass index should be between 19-25. Your Body mass index is 36.81 kg/m. If this is out of the aformentioned range listed, please consider follow up with your Primary Care Provider.    You will be contacted by our office prior to your procedure for directions on holding your Xarelto.  If you do not hear from our office 1 week prior to your scheduled procedure, please call [phone] to discuss.   I appreciate the  opportunity to care for you   Thank You   Harl Bowie , MD

## 2019-09-10 NOTE — Progress Notes (Signed)
Gail [name]    [id number]    1966-06-03  Primary Care Physician:Panosh, Standley Brooking, MD  Referring Physician: Burnis Medin, MD [place],  [place] [id number]   Chief complaint: Screening colonoscopy HPI:  53 year old female with history of a flutter it is post ablation, obstructive sleep apnea, hypertension here to discuss screening colonoscopy.  She had atrial ablation last month, is on chronic anticoagulation with Xarelto.  She currently has a heart monitor to identify if she has persistent A. fib or arrhythmia. Denies any nausea, vomiting, abdominal pain, melena or bright red blood per rectum.  Complained of generalized abdominal bloating. No family history of colon cancer   Outpatient Encounter Medications as of 09/10/2019  Medication Sig  . diltiazem (DILT-XR) 240 MG 24 hr capsule TAKE 1 CAPSULE BY MOUTH DAILY  . fluticasone (FLONASE) 50 MCG/ACT nasal spray 2 sprays per nostril daily.  MUST HAVE OFFICE VISIT FOR FURTHER REFILLS. (Patient taking differently: Place 2 sprays into both nostrils at bedtime. 2 sprays per nostril daily.  MUST HAVE OFFICE VISIT FOR FURTHER REFILLS.)  . Lactobacillus (ACIDOPHILUS PO) Take 1 capsule by mouth at bedtime.   Marland Kitchen levonorgestrel (MIRENA) 20 MCG/24HR IUD 1 each by Intrauterine route once.    Marland Kitchen losartan (COZAAR) 50 MG tablet TAKE 1 TABLET(50 MG) BY MOUTH DAILY  . metFORMIN (GLUCOPHAGE-XR) 500 MG 24 hr tablet TAKE 2 TABLETS BY MOUTH EVERY DAY (Patient taking differently: 500 mg daily with breakfast. TAKE 2 TABLETS BY MOUTH EVERY DAY)  . nystatin ointment (MYCOSTATIN) Apply topically as directed (Patient taking differently: Apply 1 application topically every morning. Apply topically as directed)  . RESTASIS 0.05 % ophthalmic emulsion Place 1 drop into both eyes every morning.   . vitamin B-12 (CYANOCOBALAMIN) 500 MCG tablet Take 1,000 mcg by mouth daily.  [name] 20 MG TABS tablet TAKE 1 TABLET(20 MG) BY MOUTH DAILY  WITH SUPPER  . Zinc Oxide 30.6 % CREA Apply 1 application topically every morning.    No facility-administered encounter medications on file as of 09/10/2019.     Allergies as of 09/10/2019 - Review Complete 09/10/2019  Allergen Reaction Noted  . Ace inhibitors Cough 09/19/2013  . Erythromycin ethylsuccinate  12/30/2006  . Doxycycline Rash 11/18/2012  . Latex Rash 11/12/2011    Past Medical History:  Diagnosis Date  . Allergy    History of shots as a child  . Anal fissure   . Arthritis   . History of infertility, female   . Hyperlipidemia   . Hypertension   . IBS (irritable bowel syndrome)   . Microscopic hematuria 2017   urology evaluated no sig cause    . PCO (polycystic ovaries)   . Rosacea     Past Surgical History:  Procedure Laterality Date  . A-FLUTTER ABLATION N/A 07/16/2019   Procedure: A-FLUTTER ABLATION;  Surgeon: Evans Lance, MD;  Location: [facility];  Service: Cardiovascular;  Laterality: N/A;  . ATRIAL TACH ABLATION N/A 07/16/2019   Procedure: ATRIAL TACH ABLATION;  Surgeon: Evans Lance, MD;  Location: [facility];  Service: Cardiovascular;  Laterality: N/A;  . CARPAL TUNNEL RELEASE     x2 bilateral  . KNEE ARTHROSCOPY     left   . WISDOM TOOTH EXTRACTION    . wrist tendon release     bilateral thumbs    Family History  Problem Relation Age of Onset  . Hypertension  Mother   . Heart disease Mother   . Hypertension Father   . ALS Father        deceased  . Dementia Maternal Grandfather   . Heart disease Paternal Grandmother   . Dementia Paternal Grandfather   . Hypertension Other   . Lung cancer Other        uncle  . Breast cancer Other        great aunt  . Colon cancer Neg Hx   . Stomach cancer Neg Hx   . Liver cancer Neg Hx   . Rectal cancer Neg Hx   . Esophageal cancer Neg Hx     Social History   Socioeconomic History  . Marital status: Married    Spouse name: Not on file  . Number of children: 0  . Years of  education: Not on file  . Highest education level: Not on file  Occupational History  . Occupation: Agricultural consultant: [employer]  Social Needs  . Financial resource strain: Not on file  . Food insecurity    Worry: Not on file    Inability: Not on file  . Transportation needs    Medical: Not on file    Non-medical: Not on file  Tobacco Use  . Smoking status: Former Smoker    Packs/day: 0.50    Years: 7.00    Pack years: 3.50    Types: Cigarettes    Quit date: 12/13/1986    Years since quitting: 32.7  . Smokeless tobacco: Never Used  Substance and Sexual Activity  . Alcohol use: No    Alcohol/week: 0.0 standard drinks  . Drug use: No  . Sexual activity: Not on file    Comment: married  Lifestyle  . Physical activity    Days per week: Not on file    Minutes per session: Not on file  . Stress: Not on file  Relationships  . Social Herbalist on phone: Not on file    Gets together: Not on file    Attends religious service: Not on file    Active member of club or organization: Not on file    Attends meetings of clubs or organizations: Not on file    Relationship status: Not on file  . Intimate partner violence    Fear of current or ex partner: Not on file    Emotionally abused: Not on file    Physically abused: Not on file    Forced sexual activity: Not on file  Other Topics Concern  . 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      Review of systems: Review of Systems  Constitutional: Negative for fever and chills. Positive for fatigue HENT: Positive for sinus trouble   Eyes: Negative for blurred vision.  Respiratory: Negative for cough, shortness of breath and wheezing.   Cardiovascular: Negative for chest pain and positive for palpitations.   Gastrointestinal: as per HPI Genitourinary: Negative for dysuria, urgency, frequency and hematuria.  Musculoskeletal: Positive for myalgias, back pain and joint pain.  Skin: Negative for itching and rash.  Neurological: Negative for dizziness, tremors, focal weakness, seizures and loss of consciousness.  Endo/Heme/Allergies: Positive for  seasonal allergies.  Psychiatric/Behavioral: Negative for depression, suicidal ideas and hallucinations.  All other systems reviewed and are negative.   Physical Exam: Vitals:   09/10/19 1353  BP: 126/78  Pulse: 78  Temp: 97.7 F (36.5 C)   Body mass index is 36.81 kg/m. Gen:      No acute distress HEENT:  EOMI, sclera anicteric Neck:     No masses; no thyromegaly Lungs:    Clear to auscultation bilaterally; normal respiratory effort CV:         Regular rate and rhythm; no murmurs Abd:      + bowel sounds; soft, non-tender; no palpable masses, no distension Ext:    No edema; adequate peripheral perfusion Skin:      Warm and dry; no rash Neuro: alert and oriented x 3 Psych: normal mood and affect  Data Reviewed:  Reviewed labs, radiology imaging, old records and pertinent past GI work up   Assessment and Plan/Recommendations:  53 year old female with history of a flutter status post ablation on Xarelto here to discuss colorectal cancer screening  We will request clearance from cardiology if okay to proceed with colonoscopy and if she can hold taking Xarelto for 2 days prior to the procedure  The risks and benefits as well as alternatives of endoscopic procedure(s) have been discussed and reviewed. All questions answered. The patient agrees to proceed.  Trial of IBgard 1 capsule up to 3 times daily for generalized abdominal bloating  Return as needed  K. Denzil Magnuson , MD    CC: Panosh, Standley Brooking, MD

## 2019-09-11 ENCOUNTER — Telehealth: Payer: Self-pay | Admitting: *Deleted

## 2019-09-11 ENCOUNTER — Encounter: Payer: Self-pay | Admitting: Gastroenterology

## 2019-09-11 NOTE — Telephone Encounter (Signed)
[facility] Pre-operative Risk Assessment     Request for surgical clearance:     Endoscopy Procedure  What type of surgery is being performed?     colon  When is this surgery scheduled?     11/6  What type of clearance is required ?   Pharmacy  Are there any medications that need to be held prior to surgery and how long? Xarelto  Practice name and name of physician performing surgery?      [facility]  What is your office phone and fax number?      Phone- [phone]  [phone]  Anesthesia type (None, local, MAC, general) ?       MAC

## 2019-09-11 NOTE — Telephone Encounter (Signed)
Please comment on xarelto. 

## 2019-09-12 NOTE — Telephone Encounter (Signed)
Spoke with pt, ok to hold xarelto 1 day before procedure

## 2019-09-12 NOTE — Telephone Encounter (Signed)
   Primary Cardiologist: [name], MD  Chart reviewed as part of pre-operative protocol coverage. Request received for recommendations on holding anticoagulation.   Per our clinical pharmacist: Patient with diagnosis of atrial flutter on Xarelto for anticoagulation.    Procedure: colonoscopy Date of procedure: 10/19/2019  CHADS2-VASc score of  2 (HTN, female)  CrCl 108.2        Platelet count 226  Per office protocol, patient can hold Xarelto for 1 days prior to procedure  I will route this recommendation to the requesting party via [facility] fax function and remove from pre-op pool.  Please call with questions.  [place], PA 09/12/2019, 11:57 AM

## 2019-09-12 NOTE — Telephone Encounter (Signed)
Patient with diagnosis of atrial flutter on Xarelto for anticoagulation.    Procedure: colonoscopy Date of procedure: 10/19/2019  CHADS2-VASc score of  2 (HTN, female)  CrCl 108.2  Platelet count 226  Per office protocol, patient can hold Xarelto for 1 days prior to procedure.   Marland Kitchen

## 2019-09-18 DIAGNOSIS — M65331 Trigger finger, right middle finger: Secondary | ICD-10-CM | POA: Diagnosis not present

## 2019-09-23 ENCOUNTER — Other Ambulatory Visit: Payer: Self-pay | Admitting: Internal Medicine

## 2019-10-01 NOTE — Telephone Encounter (Signed)
Call returned to Pt per request.  Discussed results of heart monitor.  Provided reassurance that Pt's palpitations appear to be PAC's.  Discussed benign nature of PAC's.  Pt reassured.  Advised Pt to call if she has more frequent palpitations.

## 2019-10-05 ENCOUNTER — Other Ambulatory Visit: Payer: Self-pay

## 2019-10-05 ENCOUNTER — Encounter: Payer: Self-pay | Admitting: Gastroenterology

## 2019-10-05 ENCOUNTER — Ambulatory Visit (AMBULATORY_SURGERY_CENTER): Payer: Self-pay

## 2019-10-05 VITALS — Temp 96.6°F | Ht 60.0 in | Wt 186.2 lb

## 2019-10-05 DIAGNOSIS — Z1211 Encounter for screening for malignant neoplasm of colon: Secondary | ICD-10-CM

## 2019-10-05 MED ORDER — NA SULFATE-K SULFATE-MG SULF 17.5-3.13-1.6 GM/177ML PO SOLN: 1.0000 | Freq: Once | ORAL | 0 refills | Status: AC

## 2019-10-05 NOTE — Progress Notes (Signed)
Denies allergies to eggs or soy products. Denies complication of anesthesia or sedation. Denies use of weight loss medication. Denies use of O2.   Emmi instructions given for colonoscopy.   Covid screening is scheduled for 10/16/19 @ 8:45 Am. Patient was given a 15.00 coupon for Suprep.

## 2019-10-16 ENCOUNTER — Other Ambulatory Visit: Payer: Self-pay | Admitting: Gastroenterology

## 2019-10-16 DIAGNOSIS — Z1159 Encounter for screening for other viral diseases: Secondary | ICD-10-CM | POA: Diagnosis not present

## 2019-10-16 LAB — SARS CORONAVIRUS 2 (TAT 6-24 HRS): SARS Coronavirus 2: NEGATIVE

## 2019-10-19 ENCOUNTER — Other Ambulatory Visit: Payer: Self-pay

## 2019-10-19 ENCOUNTER — Encounter: Payer: Self-pay | Admitting: Gastroenterology

## 2019-10-19 ENCOUNTER — Ambulatory Visit (AMBULATORY_SURGERY_CENTER): Payer: BC Managed Care – PPO | Admitting: Gastroenterology

## 2019-10-19 VITALS — BP 138/82 | HR 95 | Temp 98.7°F | Resp 16 | Ht 60.0 in | Wt 186.0 lb

## 2019-10-19 DIAGNOSIS — K621 Rectal polyp: Secondary | ICD-10-CM | POA: Diagnosis not present

## 2019-10-19 DIAGNOSIS — Z1211 Encounter for screening for malignant neoplasm of colon: Secondary | ICD-10-CM | POA: Diagnosis not present

## 2019-10-19 DIAGNOSIS — K635 Polyp of colon: Secondary | ICD-10-CM

## 2019-10-19 DIAGNOSIS — D128 Benign neoplasm of rectum: Secondary | ICD-10-CM

## 2019-10-19 DIAGNOSIS — D123 Benign neoplasm of transverse colon: Secondary | ICD-10-CM

## 2019-10-19 MED ORDER — SODIUM CHLORIDE 0.9 % IV SOLN: 500.0000 mL | INTRAVENOUS | Status: DC

## 2019-10-19 NOTE — Progress Notes (Signed)
To PACU, VSS. Report to Rn.tb 

## 2019-10-19 NOTE — Progress Notes (Signed)
Called to room to assist during endoscopic procedure.  Patient ID and intended procedure confirmed with present staff. Received instructions for my participation in the procedure from the performing physician.  

## 2019-10-19 NOTE — Progress Notes (Signed)
Temp JB  V/S CW  I have reviewed the patient's medical history in detail and updated the computerized patient record. 

## 2019-10-19 NOTE — Op Note (Signed)
[facility] Patient Name: Gail [name] Procedure Date: 10/19/2019 9:30 AM MRN: [id number] Endoscopist: Mauri Pole , MD Age: 53 Referring MD:  Date of Birth: Dec 24, 1965 Gender: Female Account #: [id number] Procedure:                Colonoscopy Indications:              Screening for colorectal malignant neoplasm Medicines:                Monitored Anesthesia Care Procedure:                Pre-Anesthesia Assessment:                           - Prior to the procedure, a History and Physical                            was performed, and patient medications and                            allergies were reviewed. The patient's tolerance of                            previous anesthesia was also reviewed. The risks                            and benefits of the procedure and the sedation                            options and risks were discussed with the patient.                            All questions were answered, and informed consent                            was obtained. Prior Anticoagulants: The patient                            last took Xarelto (rivaroxaban) 2 days prior to the                            procedure. ASA Grade Assessment: III - A patient                            with severe systemic disease. After reviewing the                            risks and benefits, the patient was deemed in                            satisfactory condition to undergo the procedure.                           After obtaining informed consent, the colonoscope  was passed under direct vision. Throughout the                            procedure, the patient's blood pressure, pulse, and                            oxygen saturations were monitored continuously. The                            Colonoscope was introduced through the anus and                            advanced to the the cecum, identified by                            appendiceal  orifice and ileocecal valve. The                            colonoscopy was performed without difficulty. The                            patient tolerated the procedure well. The quality                            of the bowel preparation was good. The ileocecal                            valve, appendiceal orifice, and rectum were                            photographed. Scope In: 9:32:16 AM Scope Out: 9:50:33 AM Scope Withdrawal Time: 0 hours 11 minutes 41 seconds  Total Procedure Duration: 0 hours 18 minutes 17 seconds  Findings:                 The perianal and digital rectal examinations were                            normal.                           Two sessile polyps were found in the rectum and                            transverse colon. The polyps were 1 to 2 mm in                            size. These polyps were removed with a cold biopsy                            forceps. Resection and retrieval were complete.                           Non-bleeding internal hemorrhoids were found during  retroflexion. The hemorrhoids were small.                           The exam was otherwise without abnormality. Complications:            No immediate complications. Estimated Blood Loss:     Estimated blood loss: none. Impression:               - Two 1 to 2 mm polyps in the rectum and in the                            transverse colon, removed with a cold biopsy                            forceps. Resected and retrieved.                           - Non-bleeding internal hemorrhoids.                           - The examination was otherwise normal. Recommendation:           - Patient has a contact number available for                            emergencies. The signs and symptoms of potential                            delayed complications were discussed with the                            patient. Return to normal activities tomorrow.                             Written discharge instructions were provided to the                            patient.                           - Resume previous diet.                           - Continue present medications.                           - Await pathology results.                           - Repeat colonoscopy in 5-10 years for surveillance                            based on pathology results.                           - Resume Xarelto (rivaroxaban) at prior dose  tomorrow. Refer to managing physician for further                            adjustment of therapy. Mauri Pole, MD 10/19/2019 9:55:16 AM This report has been signed electronically.

## 2019-10-19 NOTE — Patient Instructions (Signed)
YOU HAD AN ENDOSCOPIC PROCEDURE TODAY AT [facility]:   Refer to the procedure report that was given to you for any specific questions about what was found during the examination.  If the procedure report does not answer your questions, please call your gastroenterologist to clarify.  If you requested that your care partner not be given the details of your procedure findings, then the procedure report has been included in a sealed envelope for you to review at your convenience later.  YOU SHOULD EXPECT: Some feelings of bloating in the abdomen. Passage of more gas than usual.  Walking can help get rid of the air that was put into your GI tract during the procedure and reduce the bloating. If you had a lower endoscopy (such as a colonoscopy or flexible sigmoidoscopy) you may notice spotting of blood in your stool or on the toilet paper. If you underwent a bowel prep for your procedure, you may not have a normal bowel movement for a few days.  Please Note:  You might notice some irritation and congestion in your nose or some drainage.  This is from the oxygen used during your procedure.  There is no need for concern and it should clear up in a day or so.  SYMPTOMS TO REPORT IMMEDIATELY:   Following lower endoscopy (colonoscopy or flexible sigmoidoscopy):  Excessive amounts of blood in the stool  Significant tenderness or worsening of abdominal pains  Swelling of the abdomen that is new, acute  Fever of 100F or higher   For urgent or emergent issues, a gastroenterologist can be reached at any hour by calling [phone].   DIET:  We do recommend a small meal at first, but then you may proceed to your regular diet.  Drink plenty of fluids but you should avoid alcoholic beverages for 24 hours.  ACTIVITY:  You should plan to take it easy for the rest of today and you should NOT DRIVE or use heavy machinery until tomorrow (because of the sedation medicines used during the test).     FOLLOW UP: Our staff will call the number listed on your records 48-72 hours following your procedure to check on you and address any questions or concerns that you may have regarding the information given to you following your procedure. If we do not reach you, we will leave a message.  We will attempt to reach you two times.  During this call, we will ask if you have developed any symptoms of COVID 19. If you develop any symptoms (ie: fever, flu-like symptoms, shortness of breath, cough etc.) before then, please call [phone].  If you test positive for Covid 19 in the 2 weeks post procedure, please call and report this information to [place].    If any biopsies were taken you will be contacted by phone or by letter within the next 1-3 weeks.  Please call us at [phone] if you have not heard about the biopsies in 3 weeks.    SIGNATURES/CONFIDENTIALITY: You and/or your care partner have signed paperwork which will be entered into your electronic medical record.  These signatures attest to the fact that that the information above on your After Visit Summary has been reviewed and is understood.  Full responsibility of the confidentiality of this discharge information lies with you and/or your care-partner.    Handouts were given to you on polyps and hemorrhoids. Per Dr. Silverio Decamp resume [name] at prior dose tomorrow. Your blood sugar was 85 in  the recovery room. You may resume your other current medications today. Await biopsy results. Please call if any questions or concerns.

## 2019-10-19 NOTE — Progress Notes (Signed)
No problems noted in the recovery room. maw 

## 2019-10-20 ENCOUNTER — Other Ambulatory Visit: Payer: Self-pay | Admitting: Internal Medicine

## 2019-10-23 ENCOUNTER — Telehealth: Payer: Self-pay

## 2019-10-23 NOTE — Telephone Encounter (Signed)
  Follow up Call-  Call back number 10/19/2019  Post procedure Call Back phone  # [phone]  Permission to leave phone message Yes  Some recent data might be hidden     Patient questions:  Do you have a fever, pain , or abdominal swelling? No. Pain Score  0 *  Have you tolerated food without any problems? Yes.    Have you been able to return to your normal activities? Yes.    Do you have any questions about your discharge instructions: Diet   No. Medications  No. Follow up visit  No.  Do you have questions or concerns about your Care? No.  Actions: * If pain score is 4 or above: 1. No action needed, pain <4.Have you developed a fever since your procedure? no  2.   Have you had an respiratory symptoms (SOB or cough) since your procedure? no  3.   Have you tested positive for COVID 19 since your procedure no  4.   Have you had any family members/close contacts diagnosed with the COVID 19 since your procedure?  no   If yes to any of these questions please route to Joylene John, RN and Alphonsa Gin, Therapist, sports.

## 2019-10-30 ENCOUNTER — Encounter: Payer: Self-pay | Admitting: Gastroenterology

## 2019-11-22 ENCOUNTER — Other Ambulatory Visit: Payer: Self-pay | Admitting: Internal Medicine

## 2019-11-23 ENCOUNTER — Other Ambulatory Visit: Payer: Self-pay | Admitting: Internal Medicine

## 2019-12-24 NOTE — Progress Notes (Signed)
Chief Complaint  Patient presents with  . knot on right thigh    Pt states that she noticed it in november but it is not painful pt states that it has gone down in size some but pt had falls in august and septmeber and was worried that it could be from that     HPI: Gail [name] 54 y.o. come in for new concern  See above    Fall in August .   Wearing Flip flops.  Left elbow  Saw ortho .  Lef  elvbow  And then right hip.    Soon after tender? Area than now no ss  But noted  Right inner thigh no changes  Gait ok  Had abaltion  And doing well with this but still on bloodthinnes No new neuro sx  Thinks shoes made her feet  Catch  Has had pt for balance but not really off at this time .  ( father had ALS) so gets cautions about this.   Cv doing well  Sp ablation    ROS: See pertinent positives and negatives per HPI.  Past Medical History:  Diagnosis Date  . Allergy    History of shots as a child  . Anal fissure   . Arthritis   . Diabetes mellitus without complication ([place])   . History of infertility, female   . Hyperlipidemia   . Hypertension   . IBS (irritable bowel syndrome)   . Microscopic hematuria 2017   urology evaluated no sig cause    . PCO (polycystic ovaries)   . Rosacea     Family History  Problem Relation Age of Onset  . Hypertension Mother   . Heart disease Mother   . Hypertension Father   . ALS Father        deceased  . Dementia Maternal Grandfather   . Heart disease Paternal Grandmother   . Dementia Paternal Grandfather   . Hypertension Other   . Lung cancer Other        uncle  . Breast cancer Other        great aunt  . Colon cancer Neg Hx   . Stomach cancer Neg Hx   . Liver cancer Neg Hx   . Rectal cancer Neg Hx   . Esophageal cancer Neg Hx     Social History   Socioeconomic History  . Marital status: Married    Spouse name: Not on file  . Number of children: 0  . Years of education: Not on file  . Highest education level: Not on file    Occupational History  . Occupation: Agricultural consultant: [employer]  Tobacco Use  . Smoking status: Former Smoker    Packs/day: 0.50    Years: 7.00    Pack years: 3.50    Types: Cigarettes    Quit date: 12/13/1986    Years since quitting: 33.0  . Smokeless tobacco: Never Used  Substance and Sexual Activity  . Alcohol use: No    Alcohol/week: 0.0 standard drinks  . Drug use: No  . Sexual activity: Not on file    Comment: married  Other Topics Concern  . 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   Social Determinants of Health   Financial Resource Strain:   . Difficulty of Paying Living Expenses: Not on file  Food Insecurity:   . Worried About Charity fundraiser in the Last Year: Not on file  . Ran Out of Food in the Last Year: Not on file  Transportation Needs:   . Lack of Transportation (Medical): Not on file  . Lack of Transportation (Non-Medical): Not on file  Physical Activity:   . Days of Exercise per Week: Not on file  . Minutes of Exercise per Session: Not on file  Stress:   . Feeling of Stress : Not on file  Social Connections:   . Frequency of Communication with Friends and Family: Not on file  . Frequency of Social Gatherings with Friends and Family: Not on file  . Attends Religious Services: Not on file  . Active Member of Clubs or Organizations: Not on file  . Attends Archivist Meetings: Not on file  . Marital Status: Not on file    Outpatient Medications Prior to Visit  Medication Sig Dispense Refill  . diltiazem (DILT-XR) 240 MG 24 hr capsule TAKE 1 CAPSULE BY MOUTH EVERY DAY 30 capsule 1  . fluticasone (FLONASE) 50 MCG/ACT nasal spray 2 sprays per nostril daily.  MUST HAVE OFFICE VISIT FOR FURTHER REFILLS. (Patient taking differently: Place 2  sprays into both nostrils at bedtime. 2 sprays per nostril daily.  MUST HAVE OFFICE VISIT FOR FURTHER REFILLS.) 16 g 0  . Lactobacillus (ACIDOPHILUS PO) Take 1 capsule by mouth at bedtime.     Marland Kitchen levonorgestrel (MIRENA) 20 MCG/24HR IUD 1 each by Intrauterine route once.      Marland Kitchen levonorgestrel (MIRENA, 52 MG,) 20 MCG/24HR IUD Mirena 20 mcg/24 hours (6 yrs) 52 mg intrauterine device  Take 1 device by intrauterine route.    Marland Kitchen losartan (COZAAR) 50 MG tablet TAKE 1 TABLET(50 MG) BY MOUTH DAILY 90 tablet 0  . metFORMIN (GLUCOPHAGE-XR) 500 MG 24 hr tablet TAKE 2 TABLETS BY MOUTH EVERY DAY 180 tablet 0  . nystatin ointment (MYCOSTATIN) Apply topically as directed (Patient taking differently: Apply 1 application topically every morning. Apply topically as directed) 30 g 0  . RESTASIS 0.05 % ophthalmic emulsion Place 1 drop into both eyes every morning.     . vitamin B-12 (CYANOCOBALAMIN) 500 MCG tablet Take 1,000 mcg by mouth daily.    [name] 20 MG TABS tablet TAKE 1 TABLET(20 MG) BY MOUTH DAILY WITH SUPPER 30 tablet 6  . Zinc Oxide 30.6 % CREA Apply 1 application topically every morning.      No facility-administered medications prior to visit.     EXAM:  BP 122/68 (BP Location: Right Arm, Patient Position: Sitting, Cuff Size: Normal)   Pulse 97   Temp 97.9 F (36.6 C) (Temporal)   Wt 187 lb (84.8 kg)   SpO2 98%   BMI 36.52 kg/m   Body mass index is 36.52 kg/m.  GENERAL: vitals reviewed and listed above, alert, oriented, appears well hydrated and in no acute distress HEENT: atraumatic, conjunctiva  clear, no obvious abnormalities on inspection of external nose and ears OP : masked   NECK: no obvious masses on inspection palpation   CV: HRRR, no clubbing cyanosis or  peripheral edema nl cap refill  MS: moves all extremities   Right thigh with  Soft tissue area poss lipoma about 3-4 cm non tender and not related to joint  Gait  fine  No bruising    Left leg has a linear superficial bruise(  that she says is from her pet  ) PSYCH: pleasant and cooperative, no obvious depression or anxiety Lab Results  Component Value Date   WBC 5.4 07/13/2019   HGB 14.4 07/13/2019   HCT 42.8 07/13/2019   PLT 226 07/13/2019   GLUCOSE 97 07/13/2019   CHOL 191 09/26/2018   TRIG 74.0 09/26/2018   HDL 51.60 09/26/2018   LDLDIRECT 160.7 12/10/2010   LDLCALC 124 (H) 09/26/2018   ALT 11 09/26/2018   AST 12 09/26/2018   NA 139 07/13/2019   K 4.4 07/13/2019   CL 102 07/13/2019   CREATININE 0.83 07/13/2019   BUN 12 07/13/2019   CO2 22 07/13/2019   TSH 2.22 09/26/2018   HGBA1C 5.6 09/26/2018   BP Readings from Last 3 Encounters:  12/25/19 122/68  10/19/19 138/82  09/10/19 126/78    ASSESSMENT AND PLAN:  Discussed the following assessment and plan:  Skin lump of leg, right - prob lipoma or other benign process   Fasting hyperglycemia - Plan: Basic metabolic panel, Lipid panel, TSH, CBC with Differential, Hemoglobin A1c, Hepatic function panel  Medication management - Plan: Basic metabolic panel, Lipid panel, TSH, CBC with Differential, Hemoglobin A1c, Hepatic function panel  labs  for preventive health examination - Plan: Basic metabolic panel, Lipid panel, TSH, CBC with Differential, Hemoglobin A1c, Hepatic function panel  Hyperlipidemia, unspecified hyperlipidemia type - Plan: Basic metabolic panel, Lipid panel, TSH, CBC with Differential, Hemoglobin A1c, Hepatic function panel  Anticoagulant long-term use Observe  P[lan labs and cpx in about 3 mos  Disc balance exercises and fll prevention -Patient advised to return or notify health care team  if  new concerns arise.  Patient Instructions  This could be a lipoma  A benign process lets watch this .  No concerns.   Balance exercises   Try tai chi.   Let me know  If changing but Not concerned today .     Standley Brooking. Aprile Dickenson M.D.

## 2019-12-25 ENCOUNTER — Ambulatory Visit: Payer: BC Managed Care – PPO | Admitting: Internal Medicine

## 2019-12-25 ENCOUNTER — Encounter: Payer: Self-pay | Admitting: Internal Medicine

## 2019-12-25 ENCOUNTER — Other Ambulatory Visit: Payer: Self-pay

## 2019-12-25 VITALS — BP 122/68 | HR 97 | Temp 97.9°F | Wt 187.0 lb

## 2019-12-25 DIAGNOSIS — E785 Hyperlipidemia, unspecified: Secondary | ICD-10-CM

## 2019-12-25 DIAGNOSIS — Z0000 Encounter for general adult medical examination without abnormal findings: Secondary | ICD-10-CM

## 2019-12-25 DIAGNOSIS — Z79899 Other long term (current) drug therapy: Secondary | ICD-10-CM

## 2019-12-25 DIAGNOSIS — R2241 Localized swelling, mass and lump, right lower limb: Secondary | ICD-10-CM

## 2019-12-25 DIAGNOSIS — R7301 Impaired fasting glucose: Secondary | ICD-10-CM

## 2019-12-25 DIAGNOSIS — Z7901 Long term (current) use of anticoagulants: Secondary | ICD-10-CM

## 2019-12-25 NOTE — Patient Instructions (Addendum)
This could be a lipoma  A benign process lets watch this .  No concerns.   Balance exercises   Try tai chi.   Let me know  If changing but Not concerned today .

## 2020-01-29 ENCOUNTER — Other Ambulatory Visit: Payer: Self-pay | Admitting: Adult Health

## 2020-01-29 NOTE — Telephone Encounter (Signed)
Forwarding to PCPs CMA

## 2020-01-30 MED ORDER — NYSTATIN 100000 UNIT/GM EX OINT: TOPICAL_OINTMENT | CUTANEOUS | 0 refills | Status: DC

## 2020-02-01 NOTE — Telephone Encounter (Signed)
Not sure why you cant have  Food products  Unless latex in th foods  Some people with latex allergy can have certain food rx  .   But if you haven't had any reaction you should be ok

## 2020-02-04 ENCOUNTER — Other Ambulatory Visit: Payer: Self-pay | Admitting: Internal Medicine

## 2020-02-06 ENCOUNTER — Other Ambulatory Visit: Payer: Self-pay | Admitting: Internal Medicine

## 2020-02-21 ENCOUNTER — Telehealth: Payer: Self-pay | Admitting: Internal Medicine

## 2020-02-21 NOTE — Telephone Encounter (Signed)
I spoke with patient and let her know the readings from her watch have been received and sent to Dr Lovena Le for review.   Patient asked if it is recommended she get covid vaccine.  I told her we are recommending this to all our patients.

## 2020-02-21 NOTE — Telephone Encounter (Signed)
Get vaccinated ASAP.

## 2020-02-21 NOTE — Telephone Encounter (Signed)
New Message:      Pt said she sent over some readings from her Apple Watch via My-Chart. She wanted to be sure somebody saw them and would give her a call back today please.

## 2020-02-26 ENCOUNTER — Encounter: Payer: Self-pay | Admitting: Internal Medicine

## 2020-02-26 ENCOUNTER — Other Ambulatory Visit: Payer: Self-pay

## 2020-02-26 ENCOUNTER — Ambulatory Visit: Payer: BC Managed Care – PPO | Admitting: Internal Medicine

## 2020-02-26 VITALS — BP 150/86 | HR 90 | Ht 60.0 in | Wt 176.0 lb

## 2020-02-26 DIAGNOSIS — R002 Palpitations: Secondary | ICD-10-CM

## 2020-02-26 DIAGNOSIS — I4892 Unspecified atrial flutter: Secondary | ICD-10-CM | POA: Diagnosis not present

## 2020-02-26 NOTE — Progress Notes (Signed)
HPI Gail [name] returns today for followup of paroxysmal atrial arrhythmias. She has a h/o atrial flutter and HTN. She does not carry a diagnosis of DM but has insulin resistence and is on metformin. She underwent EP study and catheter ablation of atrial tachycardia and atrial flutter. She has had recurrent palpitations and has had occaisional episodes of atrial fib with a well controlled VR. She feels a little funny when she is out of rhythm but thinks that this has only been the case once over the past 3 months. At home her bp is in the 130 range. Allergies  Allergen Reactions  . Ace Inhibitors Cough  . Erythromycin Ethylsuccinate     Stomach hurts REACTION: unspecified   can take azithromycin  . Doxycycline Rash  . Latex Rash     Current Outpatient Medications  Medication Sig Dispense Refill  . diltiazem (DILT-XR) 240 MG 24 hr capsule TAKE 1 CAPSULE BY MOUTH EVERY DAY 30 capsule 1  . fluticasone (FLONASE) 50 MCG/ACT nasal spray 2 sprays per nostril daily.  MUST HAVE OFFICE VISIT FOR FURTHER REFILLS. 16 g 0  . Lactobacillus (ACIDOPHILUS PO) Take 1 capsule by mouth at bedtime.     Marland Kitchen levonorgestrel (MIRENA) 20 MCG/24HR IUD 1 each by Intrauterine route once.      Marland Kitchen levonorgestrel (MIRENA, 52 MG,) 20 MCG/24HR IUD Mirena 20 mcg/24 hours (6 yrs) 52 mg intrauterine device  Take 1 device by intrauterine route.    Marland Kitchen losartan (COZAAR) 50 MG tablet TAKE 1 TABLET(50 MG) BY MOUTH DAILY 90 tablet 0  . metFORMIN (GLUCOPHAGE-XR) 500 MG 24 hr tablet TAKE 2 TABLETS BY MOUTH EVERY DAY 180 tablet 0  . nystatin ointment (MYCOSTATIN) Apply topically as directed 30 g 0  . RESTASIS 0.05 % ophthalmic emulsion Place 1 drop into both eyes every morning.     . vitamin B-12 (CYANOCOBALAMIN) 500 MCG tablet Take 1,000 mcg by mouth daily.    [name] 20 MG TABS tablet TAKE 1 TABLET(20 MG) BY MOUTH DAILY WITH SUPPER 30 tablet 6  . Zinc Oxide 30.6 % CREA Apply 1 application topically every morning.      No  current facility-administered medications for this visit.     Past Medical History:  Diagnosis Date  . Allergy    History of shots as a child  . Anal fissure   . Arthritis   . Diabetes mellitus without complication ([place])   . History of infertility, female   . Hyperlipidemia   . Hypertension   . IBS (irritable bowel syndrome)   . Microscopic hematuria 2017   urology evaluated no sig cause    . PCO (polycystic ovaries)   . Rosacea     ROS:   All systems reviewed and negative except as noted in the HPI.   Past Surgical History:  Procedure Laterality Date  . A-FLUTTER ABLATION N/A 07/16/2019   Procedure: A-FLUTTER ABLATION;  Surgeon: Evans Lance, MD;  Location: [facility];  Service: Cardiovascular;  Laterality: N/A;  . ATRIAL TACH ABLATION N/A 07/16/2019   Procedure: ATRIAL TACH ABLATION;  Surgeon: Evans Lance, MD;  Location: [facility];  Service: Cardiovascular;  Laterality: N/A;  . CARPAL TUNNEL RELEASE     x2 bilateral  . KNEE ARTHROSCOPY     left   . WISDOM TOOTH EXTRACTION    . wrist tendon release     bilateral thumbs     Family History  Problem Relation  Age of Onset  . Hypertension Mother   . Heart disease Mother   . Hypertension Father   . ALS Father        deceased  . Dementia Maternal Grandfather   . Heart disease Paternal Grandmother   . Dementia Paternal Grandfather   . Hypertension Other   . Lung cancer Other        uncle  . Breast cancer Other        great aunt  . Colon cancer Neg Hx   . Stomach cancer Neg Hx   . Liver cancer Neg Hx   . Rectal cancer Neg Hx   . Esophageal cancer Neg Hx      Social History   Socioeconomic History  . Marital status: Married    Spouse name: Not on file  . Number of children: 0  . Years of education: Not on file  . Highest education level: Not on file  Occupational History  . Occupation: Agricultural consultant: [employer]  Tobacco Use  . Smoking status: Former  Smoker    Packs/day: 0.50    Years: 7.00    Pack years: 3.50    Types: Cigarettes    Quit date: 12/13/1986    Years since quitting: 33.2  . Smokeless tobacco: Never Used  Substance and Sexual Activity  . Alcohol use: No    Alcohol/week: 0.0 standard drinks  . Drug use: No  . Sexual activity: Not on file    Comment: married  Other Topics Concern  . 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   Social Determinants of Health   Financial Resource Strain:   . Difficulty of Paying Living Expenses:   Food Insecurity:   . Worried About Charity fundraiser in the Last Year:   . Arboriculturist in the Last Year:   Transportation Needs:   . Film/video editor (Medical):   Marland Kitchen Lack of Transportation (Non-Medical):   Physical Activity:   . Days of Exercise per Week:   . Minutes of Exercise per Session:   Stress:   . Feeling of Stress :   Social Connections:   . Frequency of Communication with Friends and Family:   . Frequency of Social Gatherings with Friends and Family:   . Attends Religious Services:   . Active Member of Clubs or Organizations:   . Attends Archivist Meetings:   Marland Kitchen Marital Status:   Intimate Partner Violence:   . Fear of Current or Ex-Partner:   . Emotionally Abused:   Marland Kitchen Physically Abused:   . Sexually Abused:      BP (!) 150/86   Pulse 90   Ht 5' (1.524 m)   Wt 176 lb (79.8 kg)   SpO2 97%   BMI 34.37 kg/m   Physical Exam:  Overweight but appearing NAD HEENT: Unremarkable Neck:  No JVD, no thyromegally Lymphatics:  No adenopathy Back:  No CVA tenderness Lungs:  Clear with no wheezs HEART:  Regular rate rhythm, no murmurs, no rubs, no clicks Abd:  soft, positive bowel sounds, no organomegally, no rebound, no guarding Ext:  2 plus pulses, no edema, no cyanosis,  no clubbing Skin:  No rashes no nodules Neuro:  CN II through XII intact, motor grossly intact  EKG - nsr  Assess/Plan: 1. Atrial flutter - she has had no recurrent flutter since her ablation. 2. Atrial tachycardia - this was incessant during the ablation and was successfully ablated.  3. PAF - she very infrequent symptoms. She has HTN, borderline DM, and I have recommended she continue her xarelto for now. 4. HTN - her sbp is up today but is often a little high, especially in the MD's office. She will work on weight loss and she will continue her current meds.  Gail [name].D.

## 2020-02-26 NOTE — Patient Instructions (Signed)

## 2020-03-02 ENCOUNTER — Other Ambulatory Visit: Payer: Self-pay | Admitting: Internal Medicine

## 2020-03-10 ENCOUNTER — Other Ambulatory Visit: Payer: Self-pay | Admitting: Internal Medicine

## 2020-03-10 NOTE — Telephone Encounter (Signed)
Age 54, weight 80kg, SCr 0.83, CrCl > 100. Aflutter indication, last OV 02/2020

## 2020-03-13 ENCOUNTER — Other Ambulatory Visit: Payer: Self-pay | Admitting: Internal Medicine

## 2020-03-13 ENCOUNTER — Other Ambulatory Visit: Payer: Self-pay

## 2020-03-14 ENCOUNTER — Ambulatory Visit: Payer: BC Managed Care – PPO | Admitting: Internal Medicine

## 2020-03-17 ENCOUNTER — Other Ambulatory Visit (INDEPENDENT_AMBULATORY_CARE_PROVIDER_SITE_OTHER): Payer: BC Managed Care – PPO

## 2020-03-17 ENCOUNTER — Other Ambulatory Visit: Payer: Self-pay

## 2020-03-17 DIAGNOSIS — E785 Hyperlipidemia, unspecified: Secondary | ICD-10-CM

## 2020-03-17 DIAGNOSIS — R7301 Impaired fasting glucose: Secondary | ICD-10-CM

## 2020-03-17 DIAGNOSIS — Z0000 Encounter for general adult medical examination without abnormal findings: Secondary | ICD-10-CM

## 2020-03-17 DIAGNOSIS — Z79899 Other long term (current) drug therapy: Secondary | ICD-10-CM | POA: Diagnosis not present

## 2020-03-17 LAB — CBC WITH DIFFERENTIAL/PLATELET
Basophils Absolute: 0 10*3/uL (ref 0.0–0.1)
Basophils Relative: 0.8 % (ref 0.0–3.0)
Eosinophils Absolute: 0.2 10*3/uL (ref 0.0–0.7)
Eosinophils Relative: 2.9 % (ref 0.0–5.0)
HCT: 42.2 % (ref 36.0–46.0)
Hemoglobin: 14.4 g/dL (ref 12.0–15.0)
Lymphocytes Relative: 23 % (ref 12.0–46.0)
Lymphs Abs: 1.3 10*3/uL (ref 0.7–4.0)
MCHC: 34.2 g/dL (ref 30.0–36.0)
MCV: 94.5 fl (ref 78.0–100.0)
Monocytes Absolute: 0.5 10*3/uL (ref 0.1–1.0)
Monocytes Relative: 9.7 % (ref 3.0–12.0)
Neutro Abs: 3.6 10*3/uL (ref 1.4–7.7)
Neutrophils Relative %: 63.6 % (ref 43.0–77.0)
Platelets: 231 10*3/uL (ref 150.0–400.0)
RBC: 4.46 Mil/uL (ref 3.87–5.11)
RDW: 13.1 % (ref 11.5–15.5)
WBC: 5.6 10*3/uL (ref 4.0–10.5)

## 2020-03-17 LAB — BASIC METABOLIC PANEL
BUN: 16 mg/dL (ref 6–23)
CO2: 25 mEq/L (ref 19–32)
Calcium: 9.2 mg/dL (ref 8.4–10.5)
Chloride: 104 mEq/L (ref 96–112)
Creatinine, Ser: 0.82 mg/dL (ref 0.40–1.20)
GFR: 73.34 mL/min (ref >60.00)
Glucose, Bld: 110 mg/dL — ABNORMAL HIGH (ref 70–99)
Potassium: 4.4 mEq/L (ref 3.5–5.1)
Sodium: 138 mEq/L (ref 135–145)

## 2020-03-17 LAB — TSH: TSH: 2.18 u[IU]/mL (ref 0.35–4.50)

## 2020-03-17 LAB — HEPATIC FUNCTION PANEL
ALT: 17 U/L (ref 0–35)
AST: 16 U/L (ref 0–37)
Albumin: 4.4 g/dL (ref 3.5–5.2)
Alkaline Phosphatase: 82 U/L (ref 39–117)
Bilirubin, Direct: 0.1 mg/dL (ref 0.0–0.3)
Total Bilirubin: 0.5 mg/dL (ref 0.2–1.2)
Total Protein: 6.9 g/dL (ref 6.0–8.3)

## 2020-03-17 LAB — LIPID PANEL
Cholesterol: 196 mg/dL (ref 0–200)
HDL: 49.4 mg/dL (ref >39.00)
LDL Cholesterol: 135 mg/dL — ABNORMAL HIGH (ref 0–99)
NonHDL: 146.34
Total CHOL/HDL Ratio: 4
Triglycerides: 55 mg/dL (ref 0.0–149.0)
VLDL: 11 mg/dL (ref 0.0–40.0)

## 2020-03-17 LAB — HEMOGLOBIN A1C: Hgb A1c MFr Bld: 5.8 % (ref 4.6–6.5)

## 2020-03-21 NOTE — Progress Notes (Signed)
Labs stable  a1c is good  but sugar borderline elevated  can discuss at  upcoming [place]

## 2020-03-23 NOTE — Progress Notes (Signed)
This visit occurred during the SARS-CoV-2 public health emergency.  Safety protocols were in place, including screening questions prior to the visit, additional usage of staff PPE, and extensive cleaning of exam room while observing appropriate contact time as indicated for disinfecting solutions.    Chief Complaint  Patient presents with  . Annual Exam    Doing okay  . Medication Management    HPI: Patient  Gail [name]  54 y.o. 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    Health Maintenance  Topic Date Due  . HIV Screening  Never done  . PAP SMEAR-Modifier  03/25/2019  . TETANUS/TDAP  11/26/2019  . INFLUENZA VACCINE  07/13/2020  . MAMMOGRAM  06/17/2021  . 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    And     ROS:  GEN/ HEENT: No fever, significant weight changes sweats headaches vision problems hearing changes, CV/ PULM; No chest pain shortness of breath cough, syncope,edema  change in exercise tolerance. GI /GU: No adominal pain, vomiting, change in bowel habits. No blood in the stool. No significant GU symptoms. SKIN/HEME: ,no acute skin rashes suspicious lesions or bleeding. No lymphadenopathy, nodules, masses.  NEURO/ PSYCH:  No neurologic signs such as weakness numbness. No depression anxiety. IMM/ Allergy: No unusual infections.  Allergy .   REST of 12 system review negative except as per HPI   Past Medical History:  Diagnosis Date  . Allergy    History of shots as a child  . Anal fissure   . Arthritis   . Diabetes mellitus without complication  ([place])   . History of infertility, female   . Hyperlipidemia   . Hypertension   . IBS (irritable bowel syndrome)   . Microscopic hematuria 2017   urology evaluated no sig cause    . PCO (polycystic ovaries)   . Rosacea     Past Surgical History:  Procedure Laterality Date  . A-FLUTTER ABLATION N/A 07/16/2019   Procedure: A-FLUTTER ABLATION;  Surgeon: Evans Lance, MD;  Location: [facility];  Service: Cardiovascular;  Laterality: N/A;  . ATRIAL TACH ABLATION N/A 07/16/2019   Procedure: ATRIAL TACH ABLATION;  Surgeon: Evans Lance, MD;  Location: [facility];  Service: Cardiovascular;  Laterality: N/A;  . CARPAL TUNNEL RELEASE     x2 bilateral  . KNEE ARTHROSCOPY     left   . WISDOM TOOTH EXTRACTION    . wrist tendon release     bilateral thumbs    Family History  Problem Relation Age of Onset  . Hypertension Mother   . Heart disease Mother   . Hypertension Father   . ALS Father        deceased  . Dementia Maternal Grandfather   . Heart disease Paternal Grandmother   . Dementia Paternal Grandfather   . Hypertension Other   . Lung cancer Other        uncle  . Breast  cancer Other        great aunt  . Colon cancer Neg Hx   . Stomach cancer Neg Hx   . Liver cancer Neg Hx   . Rectal cancer Neg Hx   . Esophageal cancer Neg Hx     Social History   Socioeconomic History  . Marital status: Married    Spouse name: Not on file  . Number of children: 0  . Years of education: Not on file  . Highest education level: Not on file  Occupational History  . Occupation: Agricultural consultant: [employer]  Tobacco Use  . Smoking status: Former Smoker    Packs/day: 0.50    Years: 7.00    Pack years: 3.50    Types: Cigarettes    Quit date: 12/13/1986    Years since quitting: 33.3  . Smokeless tobacco: Never Used  Substance and Sexual Activity  . Alcohol use: No    Alcohol/week: 0.0 standard drinks  . Drug use: No  . Sexual activity: Not  on file    Comment: married  Other Topics Concern  . 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   Social Determinants of Health   Financial Resource Strain:   . Difficulty of Paying Living Expenses:   Food Insecurity:   . Worried About Charity fundraiser in the Last Year:   . Arboriculturist in the Last Year:   Transportation Needs:   . Film/video editor (Medical):   Marland Kitchen Lack of Transportation (Non-Medical):   Physical Activity:   . Days of Exercise per Week:   . Minutes of Exercise per Session:   Stress:   . Feeling of Stress :   Social Connections:   . Frequency of Communication with Friends and Family:   . Frequency of Social Gatherings with Friends and Family:   . Attends Religious Services:   . Active Member of Clubs or Organizations:   . Attends Archivist Meetings:   Marland Kitchen Marital Status:     Outpatient Medications Prior to Visit  Medication Sig Dispense Refill  . diltiazem (DILT-XR) 240 MG 24 hr capsule TAKE 1 CAPSULE BY MOUTH EVERY DAY 30 capsule 1  . fluticasone (FLONASE) 50 MCG/ACT nasal spray 2 sprays per nostril daily.  MUST HAVE OFFICE VISIT FOR FURTHER REFILLS. 16 g 0  . Lactobacillus (ACIDOPHILUS PO) Take 1 capsule by mouth at bedtime.     Marland Kitchen levonorgestrel (MIRENA, 52 MG,) 20 MCG/24HR IUD Mirena 20 mcg/24 hours (6 yrs) 52 mg intrauterine device  Take 1 device by intrauterine route.    Marland Kitchen losartan (COZAAR) 50 MG tablet TAKE 1 TABLET(50 MG) BY MOUTH DAILY 90 tablet 0  . metFORMIN (GLUCOPHAGE-XR) 500 MG 24 hr tablet TAKE 2 TABLETS BY MOUTH EVERY DAY 180 tablet 0  . nystatin ointment (MYCOSTATIN) Apply topically as directed 30 g 0  . RESTASIS 0.05 % ophthalmic emulsion Place 1 drop into both eyes every morning.     . vitamin B-12 (CYANOCOBALAMIN) 500 MCG tablet Take 1,000 mcg  by mouth daily.    [name] 20 MG TABS tablet TAKE 1 TABLET BY MOUTH EVERY DAY WITH SUPPER 30 tablet 6  .  Zinc Oxide 30.6 % CREA Apply 1 application topically every morning.      No facility-administered medications prior to visit.     EXAM:  BP 124/72   Pulse 89   Temp 98.2 F (36.8 C) (Temporal)   Ht 4' 11.1" (1.501 m)   Wt 179 lb 3.2 oz (81.3 kg)   SpO2 98%   BMI 36.07 kg/m   Body mass index is 36.07 kg/m. Wt Readings from Last 3 Encounters:  03/24/20 179 lb 3.2 oz (81.3 kg)  02/26/20 176 lb (79.8 kg)  12/25/19 187 lb (84.8 kg)    Physical Exam: Vital signs reviewed [id number] is a well-developed well-nourished alert cooperative    who appearsr stated age in no acute distress.  HEENT: normocephalic atraumatic , Eyes: PERRL EOM's full, conjunctiva clear, Nares: paten,t no deformity discharge or tenderness., Ears: no deformity EAC's clear TMs with normal landmarks. Mouth:masked NECK: supple without masses, thyromegaly or bruits. CHEST/PULM:  Clear to auscultation and percussion breath sounds equal no wheeze , rales or rhonchi. No chest wall deformities or tenderness. Breast: normal by inspection . No dimpling, discharge, masses, tenderness or discharge . CV: PMI is nondisplaced, S1 S2 no gallops, , rubs. Slight syst m usb no radiation  Peripheral pulses are full without delay.No JVD .  ABDOMEN: Bowel sounds normal nontender  No guard or rebound, no hepato splenomegal no CVA tenderness.   Extremtities:  No clubbing cyanosis or edema, no acute joint swelling or redness no focal atrophy right wrist in support  NEURO:  Oriented x3, cranial nerves 3-12 appear to be intact, no obvious focal weakness,gait within normal limits no abnormal reflexes or asymmetrical SKIN: No acute rashes normal turgor, color, no bruising or petechiae. PSYCH: Oriented, good eye contact, no obvious depression anxiety, cognition and judgment appear normal. LN: no cervical axillary inguinal adenopathy  Lab  Results  Component Value Date   WBC 5.6 03/17/2020   HGB 14.4 03/17/2020   HCT 42.2 03/17/2020   PLT 231.0 03/17/2020   GLUCOSE 110 (H) 03/17/2020   CHOL 196 03/17/2020   TRIG 55.0 03/17/2020   HDL 49.40 03/17/2020   LDLDIRECT 160.7 12/10/2010   LDLCALC 135 (H) 03/17/2020   ALT 17 03/17/2020   AST 16 03/17/2020   NA 138 03/17/2020   K 4.4 03/17/2020   CL 104 03/17/2020   CREATININE 0.82 03/17/2020   BUN 16 03/17/2020   CO2 25 03/17/2020   TSH 2.18 03/17/2020   HGBA1C 5.8 03/17/2020    BP Readings from Last 3 Encounters:  03/24/20 124/72  02/26/20 (!) 150/86  12/25/19 122/68    Lab results reviewed with patient   ASSESSMENT AND PLAN:  Discussed the following assessment and plan:    ICD-10-CM   1. Visit for preventive health examination  Z00.00   2. Medication management  Z79.899 Hemoglobin A1c    Vitamin B12  3. Metabolic syndrome  123XX123 Hemoglobin A1c    Vitamin B12  4. Low normal vitamin B12 level  E53.8 Hemoglobin A1c    Vitamin B12  5. Fasting hyperglycemia  R73.01 Hemoglobin A1c    Vitamin B12  6. Essential hypertension  I10   7. Anticoagulant long-term use  Z79.01   8. Atrial fibrillation, unspecified type (HCC)  I48.91   can do b12 and a1c in 6 mos  Check and fu virtual or in person   TD booster when convenient  contnue lsii weight control and metformin  Return in about 6 months (around 09/23/2020) for lab  then virtual or in person visit for results .  Patient Care Team: Michol Emory, Standley Brooking, MD as PCP - General (Internal Medicine) Evans Lance, MD as PCP - Cardiology (Cardiology) Satira Sark, MD (Cardiology) Cheri Fowler, MD as Consulting Physician (Obstetrics and Gynecology) Patient Instructions  Continue lifestyle intervention healthy eating and exercise .   Can plan 6 mos hg a1c and b12 .    Or as needed.   Health Maintenance, Female Adopting a healthy lifestyle and getting preventive care are important in promoting health and  wellness. Ask your health care provider about:  The right schedule for you to have regular tests and exams.  Things you can do on your own to prevent diseases and keep yourself healthy. What should I know about diet, weight, and exercise? Eat a healthy diet   Eat a diet that includes plenty of vegetables, fruits, low-fat dairy products, and lean protein.  Do not eat a lot of foods that are high in solid fats, added sugars, or sodium. Maintain a healthy weight Body mass index (BMI) is used to identify weight problems. It estimates body fat based on height and weight. Your health care provider can help determine your BMI and help you achieve or maintain a healthy weight. Get regular exercise Get regular exercise. This is one of the most important things you can do for your health. Most adults should:  Exercise for at least 150 minutes each week. The exercise should increase your heart rate and make you sweat (moderate-intensity exercise).  Do strengthening exercises at least twice a week. This is in addition to the moderate-intensity exercise.  Spend less time sitting. Even light physical activity can be beneficial. Watch cholesterol and blood lipids Have your blood tested for lipids and cholesterol at 52 years of age, then have this test every 5 years. Have your cholesterol levels checked more often if:  Your lipid or cholesterol levels are high.  You are older than 52 years of age.  You are at high risk for heart disease. What should I know about cancer screening? Depending on your health history and family history, you may need to have cancer screening at various ages. This may include screening for:  Breast cancer.  Cervical cancer.  Colorectal cancer.  Skin cancer.  Lung cancer. What should I know about heart disease, diabetes, and high blood pressure? Blood pressure and heart disease  High blood pressure causes heart disease and increases the risk of stroke. This is  more likely to develop in people who have high blood pressure readings, are of African descent, or are overweight.  Have your blood pressure checked: ? Every 3-5 years if you are 61-98 years of age. ? Every year if you are 81 years old or older. Diabetes Have regular diabetes screenings. This checks your fasting blood sugar level. Have the screening done:  Once every three years after age 39 if you are at a normal weight and have a low risk for diabetes.  More often and at a younger age if you are overweight or have a high risk for diabetes. What should I know about preventing infection? Hepatitis B If you have a higher risk for hepatitis B, you should be screened for this virus. Talk with your health care provider to find out if you are at risk for hepatitis B infection. Hepatitis C Testing is recommended for:  Everyone born from [birth year] through 1963.  Anyone with known risk factors for hepatitis C. Sexually transmitted infections (  STIs)  Get screened for STIs, including gonorrhea and chlamydia, if: ? You are sexually active and are younger than 52 years of age. ? You are older than 52 years of age and your health care provider tells you that you are at risk for this type of infection. ? Your sexual activity has changed since you were last screened, and you are at increased risk for chlamydia or gonorrhea. Ask your health care provider if you are at risk.  Ask your health care provider about whether you are at high risk for HIV. Your health care provider may recommend a prescription medicine to help prevent HIV infection. If you choose to take medicine to prevent HIV, you should first get tested for HIV. You should then be tested every 3 months for as long as you are taking the medicine. Pregnancy  If you are about to stop having your period (premenopausal) and you may become pregnant, seek counseling before you get pregnant.  Take 400 to 800 micrograms (mcg) of folic acid every day if  you become pregnant.  Ask for birth control (contraception) if you want to prevent pregnancy. Osteoporosis and menopause Osteoporosis is a disease in which the bones lose minerals and strength with aging. This can result in bone fractures. If you are 51 years old or older, or if you are at risk for osteoporosis and fractures, ask your health care provider if you should:  Be screened for bone loss.  Take a calcium or vitamin D supplement to lower your risk of fractures.  Be given hormone replacement therapy (HRT) to treat symptoms of menopause. Follow these instructions at home: Lifestyle  Do not use any products that contain nicotine or tobacco, such as cigarettes, e-cigarettes, and chewing tobacco. If you need help quitting, ask your health care provider.  Do not use street drugs.  Do not share needles.  Ask your health care provider for help if you need support or information about quitting drugs. Alcohol use  Do not drink alcohol if: ? Your health care provider tells you not to drink. ? You are pregnant, may be pregnant, or are planning to become pregnant.  If you drink alcohol: ? Limit how much you use to 0-1 drink a day. ? Limit intake if you are breastfeeding.  Be aware of how much alcohol is in your drink. In the [place] one drink equals one 12 oz bottle of beer (355 mL), one 5 oz glass of wine (148 mL), or one 1 oz glass of hard liquor (44 mL). General instructions  Schedule regular health, dental, and eye exams.  Stay current with your vaccines.  Tell your health care provider if: ? You often feel depressed. ? You have ever been abused or do not feel safe at home. Summary  Adopting a healthy lifestyle and getting preventive care are important in promoting health and wellness.  Follow your health care provider's instructions about healthy diet, exercising, and getting tested or screened for diseases.  Follow your health care provider's instructions on monitoring  your cholesterol and blood pressure. This information is not intended to replace advice given to you by your health care provider. Make sure you discuss any questions you have with your health care provider. Document Revised: 11/22/2018 Document Reviewed: 11/22/2018 Elsevier Patient Education  2020 [place] Deago Burruss M.D.

## 2020-03-24 ENCOUNTER — Ambulatory Visit (INDEPENDENT_AMBULATORY_CARE_PROVIDER_SITE_OTHER): Payer: BC Managed Care – PPO | Admitting: Internal Medicine

## 2020-03-24 ENCOUNTER — Encounter: Payer: Self-pay | Admitting: Internal Medicine

## 2020-03-24 ENCOUNTER — Other Ambulatory Visit: Payer: Self-pay

## 2020-03-24 VITALS — BP 124/72 | HR 89 | Temp 98.2°F | Ht 59.1 in | Wt 179.2 lb

## 2020-03-24 DIAGNOSIS — Z79899 Other long term (current) drug therapy: Secondary | ICD-10-CM

## 2020-03-24 DIAGNOSIS — Z7901 Long term (current) use of anticoagulants: Secondary | ICD-10-CM

## 2020-03-24 DIAGNOSIS — E538 Deficiency of other specified B group vitamins: Secondary | ICD-10-CM | POA: Diagnosis not present

## 2020-03-24 DIAGNOSIS — I10 Essential (primary) hypertension: Secondary | ICD-10-CM

## 2020-03-24 DIAGNOSIS — Z0000 Encounter for general adult medical examination without abnormal findings: Secondary | ICD-10-CM | POA: Diagnosis not present

## 2020-03-24 DIAGNOSIS — E8881 Metabolic syndrome: Secondary | ICD-10-CM | POA: Diagnosis not present

## 2020-03-24 DIAGNOSIS — R7301 Impaired fasting glucose: Secondary | ICD-10-CM

## 2020-03-24 DIAGNOSIS — I4891 Unspecified atrial fibrillation: Secondary | ICD-10-CM

## 2020-03-24 MED ORDER — METRONIDAZOLE 0.75 % EX GEL: 1.0000 "application " | Freq: Two times a day (BID) | CUTANEOUS | 1 refills | Status: DC

## 2020-03-24 NOTE — Patient Instructions (Addendum)
Continue lifestyle intervention healthy eating and exercise .   Can plan 6 mos hg a1c and b12 .    Or as needed.   Health Maintenance, Female Adopting a healthy lifestyle and getting preventive care are important in promoting health and wellness. Ask your health care provider about:  The right schedule for you to have regular tests and exams.  Things you can do on your own to prevent diseases and keep yourself healthy. What should I know about diet, weight, and exercise? Eat a healthy diet   Eat a diet that includes plenty of vegetables, fruits, low-fat dairy products, and lean protein.  Do not eat a lot of foods that are high in solid fats, added sugars, or sodium. Maintain a healthy weight Body mass index (BMI) is used to identify weight problems. It estimates body fat based on height and weight. Your health care provider can help determine your BMI and help you achieve or maintain a healthy weight. Get regular exercise Get regular exercise. This is one of the most important things you can do for your health. Most adults should:  Exercise for at least 150 minutes each week. The exercise should increase your heart rate and make you sweat (moderate-intensity exercise).  Do strengthening exercises at least twice a week. This is in addition to the moderate-intensity exercise.  Spend less time sitting. Even light physical activity can be beneficial. Watch cholesterol and blood lipids Have your blood tested for lipids and cholesterol at 52 years of age, then have this test every 5 years. Have your cholesterol levels checked more often if:  Your lipid or cholesterol levels are high.  You are older than 52 years of age.  You are at high risk for heart disease. What should I know about cancer screening? Depending on your health history and family history, you may need to have cancer screening at various ages. This may include screening for:  Breast cancer.  Cervical  cancer.  Colorectal cancer.  Skin cancer.  Lung cancer. What should I know about heart disease, diabetes, and high blood pressure? Blood pressure and heart disease  High blood pressure causes heart disease and increases the risk of stroke. This is more likely to develop in people who have high blood pressure readings, are of African descent, or are overweight.  Have your blood pressure checked: ? Every 3-5 years if you are 79-51 years of age. ? Every year if you are 28 years old or older. Diabetes Have regular diabetes screenings. This checks your fasting blood sugar level. Have the screening done:  Once every three years after age 52 if you are at a normal weight and have a low risk for diabetes.  More often and at a younger age if you are overweight or have a high risk for diabetes. What should I know about preventing infection? Hepatitis B If you have a higher risk for hepatitis B, you should be screened for this virus. Talk with your health care provider to find out if you are at risk for hepatitis B infection. Hepatitis C Testing is recommended for:  Everyone born from [birth year] through 1965.  Anyone with known risk factors for hepatitis C. Sexually transmitted infections (STIs)  Get screened for STIs, including gonorrhea and chlamydia, if: ? You are sexually active and are younger than 52 years of age. ? You are older than 52 years of age and your health care provider tells you that you are at risk for this type of infection. ?  Your sexual activity has changed since you were last screened, and you are at increased risk for chlamydia or gonorrhea. Ask your health care provider if you are at risk.  Ask your health care provider about whether you are at high risk for HIV. Your health care provider may recommend a prescription medicine to help prevent HIV infection. If you choose to take medicine to prevent HIV, you should first get tested for HIV. You should then be tested every 3  months for as long as you are taking the medicine. Pregnancy  If you are about to stop having your period (premenopausal) and you may become pregnant, seek counseling before you get pregnant.  Take 400 to 800 micrograms (mcg) of folic acid every day if you become pregnant.  Ask for birth control (contraception) if you want to prevent pregnancy. Osteoporosis and menopause Osteoporosis is a disease in which the bones lose minerals and strength with aging. This can result in bone fractures. If you are 25 years old or older, or if you are at risk for osteoporosis and fractures, ask your health care provider if you should:  Be screened for bone loss.  Take a calcium or vitamin D supplement to lower your risk of fractures.  Be given hormone replacement therapy (HRT) to treat symptoms of menopause. Follow these instructions at home: Lifestyle  Do not use any products that contain nicotine or tobacco, such as cigarettes, e-cigarettes, and chewing tobacco. If you need help quitting, ask your health care provider.  Do not use street drugs.  Do not share needles.  Ask your health care provider for help if you need support or information about quitting drugs. Alcohol use  Do not drink alcohol if: ? Your health care provider tells you not to drink. ? You are pregnant, may be pregnant, or are planning to become pregnant.  If you drink alcohol: ? Limit how much you use to 0-1 drink a day. ? Limit intake if you are breastfeeding.  Be aware of how much alcohol is in your drink. In the [place] one drink equals one 12 oz bottle of beer (355 mL), one 5 oz glass of wine (148 mL), or one 1 oz glass of hard liquor (44 mL). General instructions  Schedule regular health, dental, and eye exams.  Stay current with your vaccines.  Tell your health care provider if: ? You often feel depressed. ? You have ever been abused or do not feel safe at home. Summary  Adopting a healthy lifestyle and getting  preventive care are important in promoting health and wellness.  Follow your health care provider's instructions about healthy diet, exercising, and getting tested or screened for diseases.  Follow your health care provider's instructions on monitoring your cholesterol and blood pressure. This information is not intended to replace advice given to you by your health care provider. Make sure you discuss any questions you have with your health care provider. Document Revised: 11/22/2018 Document Reviewed: 11/22/2018 Elsevier Patient Education  2020 [employer].

## 2020-03-28 ENCOUNTER — Ambulatory Visit: Payer: BC Managed Care – PPO | Admitting: Internal Medicine

## 2020-04-10 ENCOUNTER — Other Ambulatory Visit: Payer: Self-pay | Admitting: Internal Medicine

## 2020-04-15 ENCOUNTER — Other Ambulatory Visit: Payer: Self-pay

## 2020-04-15 MED ORDER — DILTIAZEM HCL ER COATED BEADS 360 MG PO CP24: 360.0000 mg | ORAL_CAPSULE | Freq: Every day | ORAL | 3 refills | Status: DC

## 2020-05-07 ENCOUNTER — Other Ambulatory Visit: Payer: Self-pay | Admitting: Internal Medicine

## 2020-05-22 DIAGNOSIS — Z6834 Body mass index (BMI) 34.0-34.9, adult: Secondary | ICD-10-CM | POA: Diagnosis not present

## 2020-05-22 DIAGNOSIS — Z01419 Encounter for gynecological examination (general) (routine) without abnormal findings: Secondary | ICD-10-CM | POA: Diagnosis not present

## 2020-05-22 DIAGNOSIS — Z1389 Encounter for screening for other disorder: Secondary | ICD-10-CM | POA: Diagnosis not present

## 2020-05-22 DIAGNOSIS — Z130 Encounter for screening for diseases of the blood and blood-forming organs and certain disorders involving the immune mechanism: Secondary | ICD-10-CM | POA: Diagnosis not present

## 2020-05-22 LAB — HM PAP SMEAR

## 2020-05-28 ENCOUNTER — Other Ambulatory Visit: Payer: Self-pay

## 2020-05-28 ENCOUNTER — Ambulatory Visit: Payer: BC Managed Care – PPO | Admitting: Internal Medicine

## 2020-05-28 VITALS — BP 110/66 | HR 99 | Ht 59.0 in | Wt 178.0 lb

## 2020-05-28 DIAGNOSIS — I10 Essential (primary) hypertension: Secondary | ICD-10-CM | POA: Diagnosis not present

## 2020-05-28 DIAGNOSIS — I480 Paroxysmal atrial fibrillation: Secondary | ICD-10-CM | POA: Diagnosis not present

## 2020-05-28 NOTE — Patient Instructions (Addendum)

## 2020-05-28 NOTE — Progress Notes (Signed)
HPI Mrs. Raso returns today for followup. She is a pleasant obese 54 yo woman with HTN, atrial flutter who underwent EP study and catheter ablation. She has had brief episodes of atrial fib since her ablation. She has a cardiac monitor and this has demonstrated brief episodes of atrial fib lasting minutes. She denies. Chest pain but does have mild dyspnea. She has been under stress at home. No edema. She goes out of rhythm for 2-3 minutes at a time and feels palpitations lasting minutes.  Allergies  Allergen Reactions  . Ace Inhibitors Cough  . Erythromycin Ethylsuccinate     Stomach hurts REACTION: unspecified   can take azithromycin  . Doxycycline Rash  . Latex Rash     Current Outpatient Medications  Medication Sig Dispense Refill  . diltiazem (CARDIZEM CD) 360 MG 24 hr capsule Take 1 capsule (360 mg total) by mouth daily. 90 capsule 3  . fluticasone (FLONASE) 50 MCG/ACT nasal spray 2 sprays per nostril daily.  MUST HAVE OFFICE VISIT FOR FURTHER REFILLS. 16 g 0  . Lactobacillus (ACIDOPHILUS PO) Take 1 capsule by mouth at bedtime.     Marland Kitchen levonorgestrel (MIRENA, 52 MG,) 20 MCG/24HR IUD Mirena 20 mcg/24 hours (6 yrs) 52 mg intrauterine device  Take 1 device by intrauterine route.    Marland Kitchen losartan (COZAAR) 50 MG tablet TAKE 1 TABLET(50 MG) BY MOUTH DAILY 90 tablet 0  . metFORMIN (GLUCOPHAGE-XR) 500 MG 24 hr tablet TAKE 2 TABLETS BY MOUTH EVERY DAY 180 tablet 0  . metroNIDAZOLE (METROGEL) 0.75 % gel Apply 1 application topically 2 (two) times daily. For rosacea 45 g 1  . nystatin ointment (MYCOSTATIN) APPLY TOPICALLY AS DIRECTED 30 g 0  . RESTASIS 0.05 % ophthalmic emulsion Place 1 drop into both eyes every morning.     . vitamin B-12 (CYANOCOBALAMIN) 500 MCG tablet Take 1,000 mcg by mouth daily.    [name] 20 MG TABS tablet TAKE 1 TABLET BY MOUTH EVERY DAY WITH SUPPER 30 tablet 6  . Zinc Oxide 30.6 % CREA Apply 1 application topically every morning.      No current  facility-administered medications for this visit.     Past Medical History:  Diagnosis Date  . Allergy    History of shots as a child  . Anal fissure   . Arthritis   . Diabetes mellitus without complication ([place])   . History of infertility, female   . Hyperlipidemia   . Hypertension   . IBS (irritable bowel syndrome)   . Microscopic hematuria 2017   urology evaluated no sig cause    . PCO (polycystic ovaries)   . Rosacea     ROS:   All systems reviewed and negative except as noted in the HPI.   Past Surgical History:  Procedure Laterality Date  . A-FLUTTER ABLATION N/A 07/16/2019   Procedure: A-FLUTTER ABLATION;  Surgeon: Evans Lance, MD;  Location: [facility];  Service: Cardiovascular;  Laterality: N/A;  . ATRIAL TACH ABLATION N/A 07/16/2019   Procedure: ATRIAL TACH ABLATION;  Surgeon: Evans Lance, MD;  Location: [facility];  Service: Cardiovascular;  Laterality: N/A;  . CARPAL TUNNEL RELEASE     x2 bilateral  . KNEE ARTHROSCOPY     left   . WISDOM TOOTH EXTRACTION    . wrist tendon release     bilateral thumbs     Family History  Problem Relation Age of Onset  . Hypertension Mother   .  Heart disease Mother   . Hypertension Father   . ALS Father        deceased  . Dementia Maternal Grandfather   . Heart disease Paternal Grandmother   . Dementia Paternal Grandfather   . Hypertension Other   . Lung cancer Other        uncle  . Breast cancer Other        great aunt  . Colon cancer Neg Hx   . Stomach cancer Neg Hx   . Liver cancer Neg Hx   . Rectal cancer Neg Hx   . Esophageal cancer Neg Hx      Social History   Socioeconomic History  . Marital status: Married    Spouse name: Not on file  . Number of children: 0  . Years of education: Not on file  . Highest education level: Not on file  Occupational History  . Occupation: Agricultural consultant: [employer]  Tobacco Use  . Smoking status: Former Smoker     Packs/day: 0.50    Years: 7.00    Pack years: 3.50    Types: Cigarettes    Quit date: 12/13/1986    Years since quitting: 33.4  . Smokeless tobacco: Never Used  Vaping Use  . Vaping Use: Never used  Substance and Sexual Activity  . Alcohol use: No    Alcohol/week: 0.0 standard drinks  . Drug use: No  . Sexual activity: Not on file    Comment: married  Other Topics Concern  . 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   Social Determinants of Health   Financial Resource Strain:   . Difficulty of Paying Living Expenses:   Food Insecurity:   . Worried About Charity fundraiser in the Last Year:   . Arboriculturist in the Last Year:   Transportation Needs:   . Film/video editor (Medical):   Marland Kitchen Lack of Transportation (Non-Medical):   Physical Activity:   . Days of Exercise per Week:   . Minutes of Exercise per Session:   Stress:   . Feeling of Stress :   Social Connections:   . Frequency of Communication with Friends and Family:   . Frequency of Social Gatherings with Friends and Family:   . Attends Religious Services:   . Active Member of Clubs or Organizations:   . Attends Archivist Meetings:   Marland Kitchen Marital Status:   Intimate Partner Violence:   . Fear of Current or Ex-Partner:   . Emotionally Abused:   Marland Kitchen Physically Abused:   . Sexually Abused:      BP 110/66   Pulse 99   Ht 4\' 11"  (1.499 m)   Wt 178 lb (80.7 kg)   BMI 35.95 kg/m   Physical Exam:  Well appearing overweight middle aged woman, NAD HEENT: Unremarkable Neck:  No JVD, no thyromegally Lymphatics:  No adenopathy Back:  No CVA tenderness Lungs:  Clear HEART:  Regular rate rhythm, no murmurs, no rubs, no clicks Abd:  soft, positive bowel sounds, no organomegally, no rebound, no guarding Ext:  2 plus pulses,  no edema, no cyanosis, no clubbing Skin:  No rashes no nodules  Neuro:  CN II through XII intact, motor grossly intact  EKG - nsr  Assess/Plan: 1. Atrial flutter - she has had no recurrent atrial flutter since her ablation. 2. Atrial fib - she has brief episodes which are lasting a couple of minutes. She will continue her anti-coagulation. 3. HTN - her bp is well controlled. No change in her meds.  Mikle Bosworth.D.

## 2020-06-20 ENCOUNTER — Other Ambulatory Visit: Payer: Self-pay | Admitting: Internal Medicine

## 2020-06-23 DIAGNOSIS — L814 Other melanin hyperpigmentation: Secondary | ICD-10-CM | POA: Diagnosis not present

## 2020-06-23 DIAGNOSIS — D485 Neoplasm of uncertain behavior of skin: Secondary | ICD-10-CM | POA: Diagnosis not present

## 2020-06-23 DIAGNOSIS — D225 Melanocytic nevi of trunk: Secondary | ICD-10-CM | POA: Diagnosis not present

## 2020-06-23 DIAGNOSIS — D2239 Melanocytic nevi of other parts of face: Secondary | ICD-10-CM | POA: Diagnosis not present

## 2020-06-23 DIAGNOSIS — L821 Other seborrheic keratosis: Secondary | ICD-10-CM | POA: Diagnosis not present

## 2020-06-23 DIAGNOSIS — D1801 Hemangioma of skin and subcutaneous tissue: Secondary | ICD-10-CM | POA: Diagnosis not present

## 2020-07-17 DIAGNOSIS — M65331 Trigger finger, right middle finger: Secondary | ICD-10-CM | POA: Diagnosis not present

## 2020-08-12 ENCOUNTER — Other Ambulatory Visit: Payer: Self-pay | Admitting: Internal Medicine

## 2020-09-23 MED ORDER — RIVAROXABAN 20 MG PO TABS
ORAL_TABLET | ORAL | 6 refills | Status: DC
Start: 2020-09-23 — End: 2021-04-02

## 2020-09-26 ENCOUNTER — Ambulatory Visit: Payer: BC Managed Care – PPO | Admitting: Internal Medicine

## 2020-09-30 ENCOUNTER — Other Ambulatory Visit: Payer: Self-pay | Admitting: Internal Medicine

## 2020-09-30 ENCOUNTER — Ambulatory Visit: Payer: BC Managed Care – PPO | Admitting: Pulmonary Disease

## 2020-10-06 ENCOUNTER — Telehealth (INDEPENDENT_AMBULATORY_CARE_PROVIDER_SITE_OTHER): Payer: BC Managed Care – PPO | Admitting: Internal Medicine

## 2020-10-06 ENCOUNTER — Encounter: Payer: Self-pay | Admitting: Internal Medicine

## 2020-10-06 VITALS — Ht 59.0 in | Wt 181.8 lb

## 2020-10-06 DIAGNOSIS — R7301 Impaired fasting glucose: Secondary | ICD-10-CM | POA: Diagnosis not present

## 2020-10-06 DIAGNOSIS — Z79899 Other long term (current) drug therapy: Secondary | ICD-10-CM

## 2020-10-06 DIAGNOSIS — E8881 Metabolic syndrome: Secondary | ICD-10-CM | POA: Diagnosis not present

## 2020-10-06 DIAGNOSIS — E538 Deficiency of other specified B group vitamins: Secondary | ICD-10-CM

## 2020-10-06 DIAGNOSIS — R7989 Other specified abnormal findings of blood chemistry: Secondary | ICD-10-CM

## 2020-10-06 DIAGNOSIS — Z7901 Long term (current) use of anticoagulants: Secondary | ICD-10-CM | POA: Diagnosis not present

## 2020-10-06 DIAGNOSIS — M79605 Pain in left leg: Secondary | ICD-10-CM

## 2020-10-06 DIAGNOSIS — M79604 Pain in right leg: Secondary | ICD-10-CM

## 2020-10-06 NOTE — Progress Notes (Signed)
Virtual Visit via Video Note  I connected with@ on 10/06/20 at  1:30 PM EDT by a video enabled telemedicine application and verified that I am speaking with the correct person using two identifiers. Location patient: home Location provider:work office Persons participating in the virtual visit: patient, provider  [place] national recommendations  regarding COVID 19 pandemic   video visit is advised over in office visit for this patient.  Patient aware  of the limitations of evaluation and management by telemedicine and  availability of in person appointments. and agreed to proceed.   HPI: Gail [name] presents for video visit  Was due for a1c and b12   Not done  Yet as appt had been changed  Doing ok on anticoagulation  Gets legs pains in am?  Husband needs to rub them and  Then gets better    Not a joint problem   Ever since on anticoagulant but wonders about vascular cause.  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  Past Medical History:  Diagnosis Date  . Allergy    History of shots as a child  . Anal fissure   . Arthritis   . Diabetes mellitus without complication ([place])   . History of infertility, female   . Hyperlipidemia   . Hypertension   . IBS (irritable bowel syndrome)   . Microscopic hematuria 2017   urology evaluated no sig cause    . PCO (polycystic ovaries)   . Rosacea     Past Surgical History:  Procedure Laterality Date  . A-FLUTTER ABLATION N/A 07/16/2019   Procedure: A-FLUTTER ABLATION;  Surgeon: Evans Lance, MD;  Location: [facility];  Service: Cardiovascular;  Laterality: N/A;  . ATRIAL TACH ABLATION N/A 07/16/2019   Procedure: ATRIAL TACH ABLATION;  Surgeon: Evans Lance, MD;  Location: [facility];  Service: Cardiovascular;  Laterality: N/A;  . CARPAL TUNNEL RELEASE     x2 bilateral  . KNEE ARTHROSCOPY     left   . WISDOM TOOTH EXTRACTION    . wrist tendon release      bilateral thumbs    Family History  Problem Relation Age of Onset  . Hypertension Mother   . Heart disease Mother   . Hypertension Father   . ALS Father        deceased  . Dementia Maternal Grandfather   . Heart disease Paternal Grandmother   . Dementia Paternal Grandfather   . Hypertension Other   . Lung cancer Other        uncle  . Breast cancer Other        great aunt  . Colon cancer Neg Hx   . Stomach cancer Neg Hx   . Liver cancer Neg Hx   . Rectal cancer Neg Hx   . Esophageal cancer Neg Hx     Social History   Tobacco Use  . Smoking status: Former Smoker    Packs/day: 0.50    Years: 7.00    Pack years: 3.50    Types: Cigarettes    Quit date: 12/13/1986    Years since quitting: 33.8  . Smokeless tobacco: Never Used  Vaping Use  . Vaping Use: Never used  Substance Use Topics  . Alcohol use: No    Alcohol/week: 0.0 standard drinks  . Drug use: No      Current Outpatient Medications:  .  diltiazem (CARDIZEM CD)  360 MG 24 hr capsule, Take 1 capsule (360 mg total) by mouth daily., Disp: 90 capsule, Rfl: 3 .  fluticasone (FLONASE) 50 MCG/ACT nasal spray, 2 sprays per nostril daily.  MUST HAVE OFFICE VISIT FOR FURTHER REFILLS., Disp: 16 g, Rfl: 0 .  Lactobacillus (ACIDOPHILUS PO), Take 1 capsule by mouth at bedtime. , Disp: , Rfl:  .  levonorgestrel (MIRENA, 52 MG,) 20 MCG/24HR IUD, Mirena 20 mcg/24 hours (6 yrs) 52 mg intrauterine device  Take 1 device by intrauterine route., Disp: , Rfl:  .  losartan (COZAAR) 50 MG tablet, TAKE 1 TABLET(50 MG) BY MOUTH DAILY, Disp: 90 tablet, Rfl: 0 .  metFORMIN (GLUCOPHAGE-XR) 500 MG 24 hr tablet, TAKE 2 TABLETS BY MOUTH EVERY DAY, Disp: 180 tablet, Rfl: 0 .  nystatin ointment (MYCOSTATIN), APPLY TOPICALLY AS DIRECTED, Disp: 30 g, Rfl: 0 .  RESTASIS 0.05 % ophthalmic emulsion, Place 1 drop into both eyes every morning. , Disp: , Rfl:  .  rivaroxaban (XARELTO) 20 MG TABS tablet, TAKE 1 TABLET BY MOUTH EVERY DAY WITH SUPPER, Disp:  30 tablet, Rfl: 6 .  vitamin B-12 (CYANOCOBALAMIN) 500 MCG tablet, Take 1,000 mcg by mouth daily., Disp: , Rfl:  .  Zinc Oxide 30.6 % CREA, Apply 1 application topically every morning. , Disp: , Rfl:  .  metroNIDAZOLE (METROGEL) 0.75 % gel, Apply 1 application topically 2 (two) times daily. For rosacea (Patient not taking: Reported on 10/06/2020), Disp: 45 g, Rfl: 1  EXAM: BP Readings from Last 3 Encounters:  05/28/20 110/66  03/24/20 124/72  02/26/20 (!) 150/86    VITALS per patient if applicable:  GENERAL: alert, oriented, appears well and in no acute distress HEENT: atraumatic, conjunttiva clear, no obvious abnormalities on inspection of external nose and ears NECK: normal movements of the head and neck LUNGS: on inspection no signs of respiratory distress, breathing rate appears normal, no obvious gross SOB, gasping or wheezing CV: no obvious cyanosis   PSYCH/NEURO: pleasant and cooperative, no obvious depression or anxiety, speech and thought processing grossly intact Lab Results  Component Value Date   WBC 5.6 03/17/2020   HGB 14.4 03/17/2020   HCT 42.2 03/17/2020   PLT 231.0 03/17/2020   GLUCOSE 110 (H) 03/17/2020   CHOL 196 03/17/2020   TRIG 55.0 03/17/2020   HDL 49.40 03/17/2020   LDLDIRECT 160.7 12/10/2010   LDLCALC 135 (H) 03/17/2020   ALT 17 03/17/2020   AST 16 03/17/2020   NA 138 03/17/2020   K 4.4 03/17/2020   CL 104 03/17/2020   CREATININE 0.82 03/17/2020   BUN 16 03/17/2020   CO2 25 03/17/2020   TSH 2.18 03/17/2020   HGBA1C 5.8 03/17/2020    ASSESSMENT AND PLAN:  Discussed the following assessment and plan:    ICD-10-CM   1. Low vitamin B12 level  E53.8 Hemoglobin A1c    Vitamin B12    Sedimentation rate    Magnesium    Iron, TIBC and Ferritin Panel    Potassium  2. Metabolic syndrome  C14.48 Hemoglobin A1c    Vitamin B12    Sedimentation rate    Magnesium    Iron, TIBC and Ferritin Panel    Potassium  3. Anticoagulant long-term use   Z79.01 Hemoglobin A1c    Vitamin B12    Sedimentation rate    Magnesium    Iron, TIBC and Ferritin Panel    Potassium  4. Fasting hyperglycemia  R73.01 Hemoglobin A1c    Vitamin B12  Sedimentation rate    Magnesium    Iron, TIBC and Ferritin Panel    Potassium  5. Medication management  Z79.899 Hemoglobin A1c    Vitamin B12    Sedimentation rate    Magnesium    Iron, TIBC and Ferritin Panel    Potassium  6. Pain in both lower extremities  M79.604 Hemoglobin A1c   M79.605 Vitamin B12    Sedimentation rate    Magnesium    Iron, TIBC and Ferritin Panel    Potassium    Ambulatory referral to Vascular Surgery    Counseled.  uncertain cause of leg pains not typical of pad  Poss venous or rls ype sx   Get lab and will do vascular referral ( best efficient  way to get  Studies done if appropriate)   Expectant management and discussion of plan and treatment with opportunity to ask questions and all were answered. The patient agreed with the plan and demonstrated an understanding of the instructions.   Advised to call back or seek an in-person evaluation if worsening  or having  further concerns . Return for depending on results  april cpx and labs . 30 minutes  Review and  Counsel and plan     Shanon Ace, MD

## 2020-10-22 ENCOUNTER — Telehealth: Payer: Self-pay | Admitting: Internal Medicine

## 2020-10-22 ENCOUNTER — Other Ambulatory Visit (INDEPENDENT_AMBULATORY_CARE_PROVIDER_SITE_OTHER): Payer: BC Managed Care – PPO

## 2020-10-22 DIAGNOSIS — Z7901 Long term (current) use of anticoagulants: Secondary | ICD-10-CM | POA: Diagnosis not present

## 2020-10-22 DIAGNOSIS — Z79899 Other long term (current) drug therapy: Secondary | ICD-10-CM

## 2020-10-22 DIAGNOSIS — E8881 Metabolic syndrome: Secondary | ICD-10-CM | POA: Diagnosis not present

## 2020-10-22 DIAGNOSIS — M79604 Pain in right leg: Secondary | ICD-10-CM

## 2020-10-22 DIAGNOSIS — R7301 Impaired fasting glucose: Secondary | ICD-10-CM | POA: Diagnosis not present

## 2020-10-22 DIAGNOSIS — E538 Deficiency of other specified B group vitamins: Secondary | ICD-10-CM | POA: Diagnosis not present

## 2020-10-22 DIAGNOSIS — M79605 Pain in left leg: Secondary | ICD-10-CM

## 2020-10-22 LAB — POTASSIUM: Potassium: 4 mEq/L (ref 3.5–5.1)

## 2020-10-22 LAB — HEMOGLOBIN A1C: Hgb A1c MFr Bld: 6.1 % (ref 4.6–6.5)

## 2020-10-22 LAB — MAGNESIUM: Magnesium: 2.1 mg/dL (ref 1.5–2.5)

## 2020-10-22 LAB — VITAMIN B12: Vitamin B-12: >1526 pg/mL — ABNORMAL HIGH (ref 211–911)

## 2020-10-22 LAB — SEDIMENTATION RATE: Sed Rate: 30 mm/hr (ref 0–30)

## 2020-10-22 NOTE — Telephone Encounter (Signed)
Solmon Ice is calling and stated that patient had labs done today and she needed to know what labs to release, please advise. CB is [phone]

## 2020-10-22 NOTE — Addendum Note (Signed)
Addended by: Trenda Moots on: 69/24/9324 02:14 PM   Modules accepted: Orders

## 2020-10-22 NOTE — Telephone Encounter (Signed)
I spoke with Solmon Ice, she is aware to release all the labs from 10/21.

## 2020-10-23 LAB — IRON,TIBC AND FERRITIN PANEL
%SAT: 25 % (calc) (ref 16–45)
Ferritin: 139 ng/mL (ref 16–232)
Iron: 73 ug/dL (ref 45–160)
TIBC: 296 mcg/dL (calc) (ref 250–450)

## 2020-10-23 NOTE — Progress Notes (Signed)
Iron level is normal and all labs normal except B12 is very high.  Should be able to reduce the amount of B12.  Supplement   to get enough b12

## 2020-11-18 ENCOUNTER — Other Ambulatory Visit: Payer: Self-pay

## 2020-11-18 ENCOUNTER — Ambulatory Visit (INDEPENDENT_AMBULATORY_CARE_PROVIDER_SITE_OTHER): Payer: BC Managed Care – PPO | Admitting: Internal Medicine

## 2020-11-18 VITALS — BP 126/70 | HR 97 | Ht 59.0 in | Wt 183.4 lb

## 2020-11-18 DIAGNOSIS — I10 Essential (primary) hypertension: Secondary | ICD-10-CM

## 2020-11-18 DIAGNOSIS — I480 Paroxysmal atrial fibrillation: Secondary | ICD-10-CM

## 2020-11-18 NOTE — Progress Notes (Signed)
HPI Gail [name] returns today for followup. She is a pleasant obese 54 yo woman with HTN, atrial flutter who underwent EP study and catheter ablation. She has had brief episodes of atrial fib since her ablation. She has a cardiac monitor and this has demonstrated brief episodes of atrial fib lasting minutes. She denies chest pain but does have mild dyspnea.  No edema. She goes out of rhythm for 2-3 minutes at a time and feels palpitations lasting minutes. Her bp's have been controlled. Allergies  Allergen Reactions  . Ace Inhibitors Cough  . Erythromycin Ethylsuccinate     Stomach hurts REACTION: unspecified   can take azithromycin  . Doxycycline Rash  . Latex Rash     Current Outpatient Medications  Medication Sig Dispense Refill  . diltiazem (CARDIZEM CD) 360 MG 24 hr capsule Take 1 capsule (360 mg total) by mouth daily. 90 capsule 3  . fluticasone (FLONASE) 50 MCG/ACT nasal spray 2 sprays per nostril daily.  MUST HAVE OFFICE VISIT FOR FURTHER REFILLS. 16 g 0  . Lactobacillus (ACIDOPHILUS PO) Take 1 capsule by mouth at bedtime.     Marland Kitchen levonorgestrel (MIRENA, 52 MG,) 20 MCG/24HR IUD Mirena 20 mcg/24 hours (6 yrs) 52 mg intrauterine device  Take 1 device by intrauterine route.    Marland Kitchen losartan (COZAAR) 50 MG tablet TAKE 1 TABLET(50 MG) BY MOUTH DAILY 90 tablet 0  . metFORMIN (GLUCOPHAGE-XR) 500 MG 24 hr tablet TAKE 2 TABLETS BY MOUTH EVERY DAY 180 tablet 0  . metroNIDAZOLE (METROGEL) 0.75 % gel Apply 1 application topically 2 (two) times daily. For rosacea 45 g 1  . nystatin ointment (MYCOSTATIN) APPLY TOPICALLY AS DIRECTED 30 g 0  . RESTASIS 0.05 % ophthalmic emulsion Place 1 drop into both eyes every morning.     . rivaroxaban (XARELTO) 20 MG TABS tablet TAKE 1 TABLET BY MOUTH EVERY DAY WITH SUPPER 30 tablet 6  . vitamin B-12 (CYANOCOBALAMIN) 500 MCG tablet Take 1,000 mcg by mouth daily.    . Zinc Oxide 30.6 % CREA Apply 1 application topically every morning.      No current  facility-administered medications for this visit.     Past Medical History:  Diagnosis Date  . Allergy    History of shots as a child  . Anal fissure   . Arthritis   . Diabetes mellitus without complication ([place])   . History of infertility, female   . Hyperlipidemia   . Hypertension   . IBS (irritable bowel syndrome)   . Microscopic hematuria 2017   urology evaluated no sig cause    . PCO (polycystic ovaries)   . Rosacea     ROS:   All systems reviewed and negative except as noted in the HPI.   Past Surgical History:  Procedure Laterality Date  . A-FLUTTER ABLATION N/A 07/16/2019   Procedure: A-FLUTTER ABLATION;  Surgeon: Evans Lance, MD;  Location: [facility];  Service: Cardiovascular;  Laterality: N/A;  . ATRIAL TACH ABLATION N/A 07/16/2019   Procedure: ATRIAL TACH ABLATION;  Surgeon: Evans Lance, MD;  Location: [facility];  Service: Cardiovascular;  Laterality: N/A;  . CARPAL TUNNEL RELEASE     x2 bilateral  . KNEE ARTHROSCOPY     left   . WISDOM TOOTH EXTRACTION    . wrist tendon release     bilateral thumbs     Family History  Problem Relation Age of Onset  . Hypertension Mother   .  Heart disease Mother   . Hypertension Father   . ALS Father        deceased  . Dementia Maternal Grandfather   . Heart disease Paternal Grandmother   . Dementia Paternal Grandfather   . Hypertension Other   . Lung cancer Other        uncle  . Breast cancer Other        great aunt  . Colon cancer Neg Hx   . Stomach cancer Neg Hx   . Liver cancer Neg Hx   . Rectal cancer Neg Hx   . Esophageal cancer Neg Hx      Social History   Socioeconomic History  . Marital status: Married    Spouse name: Not on file  . Number of children: 0  . Years of education: Not on file  . Highest education level: Not on file  Occupational History  . Occupation: Agricultural consultant: [employer]  Tobacco Use  . Smoking status: Former Smoker     Packs/day: 0.50    Years: 7.00    Pack years: 3.50    Types: Cigarettes    Quit date: 12/13/1986    Years since quitting: 33.9  . Smokeless tobacco: Never Used  Vaping Use  . Vaping Use: Never used  Substance and Sexual Activity  . Alcohol use: No    Alcohol/week: 0.0 standard drinks  . Drug use: No  . Sexual activity: Not on file    Comment: married  Other Topics Concern  . 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   Social Determinants of Health   Financial Resource Strain:   . Difficulty of Paying Living Expenses: Not on file  Food Insecurity:   . Worried About Charity fundraiser in the Last Year: Not on file  . Ran Out of Food in the Last Year: Not on file  Transportation Needs:   . Lack of Transportation (Medical): Not on file  . Lack of Transportation (Non-Medical): Not on file  Physical Activity:   . Days of Exercise per Week: Not on file  . Minutes of Exercise per Session: Not on file  Stress:   . Feeling of Stress : Not on file  Social Connections:   . Frequency of Communication with Friends and Family: Not on file  . Frequency of Social Gatherings with Friends and Family: Not on file  . Attends Religious Services: Not on file  . Active Member of Clubs or Organizations: Not on file  . Attends Archivist Meetings: Not on file  . Marital Status: Not on file  Intimate Partner Violence:   . Fear of Current or Ex-Partner: Not on file  . Emotionally Abused: Not on file  . Physically Abused: Not on file  . Sexually Abused: Not on file     BP 126/70   Pulse 97   Ht 4\' 11"  (1.499 m)   Wt 183 lb 6.4 oz (83.2 kg)   SpO2 98%   BMI 37.04 kg/m   Physical Exam:  Well appearing NAD HEENT: Unremarkable Neck:  No JVD, no thyromegally Lymphatics:  No adenopathy Back:  No CVA  tenderness Lungs:  Clear with no  wheezes HEART:  Regular rate rhythm, no murmurs, no rubs, no clicks Abd:  soft, positive bowel sounds, no organomegally, no rebound, no guarding Ext:  2 plus pulses, no edema, no cyanosis, no clubbing Skin:  No rashes no nodules Neuro:  CN II through XII intact, motor grossly intact   Assess/Plan: 1. Atrial flutter - she has had none since her ablation. 2. Atrial fib - minimal symptoms. However, her risks is such that I think that she should remain on Xarleto unless she were to have bleeding. 3. HTN -her bp is well controlled today. Continue her current meds.  Gail Overlie Lurline Caver,MD

## 2020-11-18 NOTE — Patient Instructions (Signed)

## 2020-11-20 ENCOUNTER — Telehealth: Payer: Self-pay

## 2020-11-20 NOTE — Telephone Encounter (Signed)
Patient called to r/s referral appt with [facility] Rescheduled per patient request. She had questions about ABI test. Answered to patient satisfaction. She describes pain with walking - like a tingling pain sensation. Wanted to know if she was going to the correct doctor - advised her symptoms sounded like claudication and the ABIs would help [place] determine a plan. Patient verbalized understanding.

## 2020-12-09 ENCOUNTER — Encounter (HOSPITAL_COMMUNITY): Payer: BC Managed Care – PPO

## 2020-12-09 ENCOUNTER — Encounter: Payer: BC Managed Care – PPO | Admitting: Vascular Surgery

## 2020-12-09 ENCOUNTER — Other Ambulatory Visit: Payer: Self-pay

## 2020-12-09 ENCOUNTER — Other Ambulatory Visit: Payer: Self-pay | Admitting: *Deleted

## 2020-12-09 DIAGNOSIS — M79606 Pain in leg, unspecified: Secondary | ICD-10-CM

## 2020-12-23 ENCOUNTER — Encounter: Payer: BC Managed Care – PPO | Admitting: Vascular Surgery

## 2020-12-23 ENCOUNTER — Encounter (HOSPITAL_COMMUNITY): Payer: BC Managed Care – PPO

## 2020-12-26 ENCOUNTER — Other Ambulatory Visit: Payer: Self-pay | Admitting: Internal Medicine

## 2020-12-27 ENCOUNTER — Other Ambulatory Visit: Payer: Self-pay | Admitting: Internal Medicine

## 2020-12-30 ENCOUNTER — Telehealth: Payer: Self-pay | Admitting: Internal Medicine

## 2020-12-30 NOTE — Telephone Encounter (Signed)
Patient tested positive for COvid this past Saturday.  Congestion is hurting in patient's jaw.  Patient is able to blow out congestion but it is kinda bloody when she blows out--side of jaw is the worst pain.

## 2020-12-30 NOTE — Telephone Encounter (Signed)
Not sure what to do with this message that she needed advise or visit    make a video visit with me and/or have the nurse call her about further information and needs.

## 2020-12-31 ENCOUNTER — Encounter: Payer: Self-pay | Admitting: Adult Health

## 2020-12-31 ENCOUNTER — Telehealth (INDEPENDENT_AMBULATORY_CARE_PROVIDER_SITE_OTHER): Payer: BC Managed Care – PPO | Admitting: Adult Health

## 2020-12-31 VITALS — Temp 98.7°F | Wt 183.0 lb

## 2020-12-31 DIAGNOSIS — U071 COVID-19: Secondary | ICD-10-CM | POA: Diagnosis not present

## 2020-12-31 NOTE — Telephone Encounter (Signed)
Spoke with the pt and scheduled an appt with Gail [name] as PCP does not have any openings today.

## 2020-12-31 NOTE — Progress Notes (Signed)
Virtual Visit via Video Note  I connected with Gail [name] on 12/31/20 at  1:30 PM EST by a video enabled telemedicine application and verified that I am speaking with the correct person using two identifiers.  Location patient: home Location provider:work or home office Persons participating in the virtual visit: patient, provider  I discussed the limitations of evaluation and management by telemedicine and the availability of in person appointments. The patient expressed understanding and agreed to proceed.   HPI: 55 year old female who is being evaluated today for an acute issue.  She reports that she tested positive for COVID 19 roughly 4 days ago.  1 day prior she started have a nonproductive cough.  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  Over the last 2 days she developed nasal congestion, facial pain and pressure.  Associated symptoms include low-grade fever up to 99.3, fatigue, and headache.  She does feel as though the headache and fever are improving.  She has been using Flonase and Tylenol at home with some relief.  She is concerned that she may be developing a bacterial sinus infection    ROS: See pertinent positives and negatives per HPI.  Past Medical History:  Diagnosis Date  . Allergy    History of shots as a child  . Anal fissure   . Arthritis   . Diabetes mellitus without complication ([place])   . History of infertility, female   . Hyperlipidemia   . Hypertension   . IBS (irritable bowel syndrome)   . Microscopic hematuria 2017   urology evaluated no sig cause    . PCO (polycystic ovaries)   . Rosacea     Past Surgical History:  Procedure Laterality Date  . A-FLUTTER ABLATION N/A 07/16/2019   Procedure: A-FLUTTER ABLATION;  Surgeon: Evans Lance, MD;  Location: [facility];  Service: Cardiovascular;  Laterality: N/A;  . ATRIAL TACH ABLATION N/A 07/16/2019   Procedure: ATRIAL TACH ABLATION;  Surgeon:  Evans Lance, MD;  Location: [facility];  Service: Cardiovascular;  Laterality: N/A;  . CARPAL TUNNEL RELEASE     x2 bilateral  . KNEE ARTHROSCOPY     left   . WISDOM TOOTH EXTRACTION    . wrist tendon release     bilateral thumbs    Family History  Problem Relation Age of Onset  . Hypertension Mother   . Heart disease Mother   . Hypertension Father   . ALS Father        deceased  . Dementia Maternal Grandfather   . Heart disease Paternal Grandmother   . Dementia Paternal Grandfather   . Hypertension Other   . Lung cancer Other        uncle  . Breast cancer Other        great aunt  . Colon cancer Neg Hx   . Stomach cancer Neg Hx   . Liver cancer Neg Hx   . Rectal cancer Neg Hx   . Esophageal cancer Neg Hx        Current Outpatient Medications:  .  diltiazem (CARDIZEM CD) 360 MG 24 hr capsule, Take 1 capsule (360 mg total) by mouth daily., Disp: 90 capsule, Rfl: 3 .  fluticasone (FLONASE) 50 MCG/ACT nasal spray, 2 sprays per nostril daily.  MUST HAVE OFFICE VISIT FOR FURTHER REFILLS., Disp: 16 g, Rfl: 0 .  Lactobacillus (ACIDOPHILUS PO), Take 1 capsule by mouth at bedtime., Disp: ,  Rfl:  .  levonorgestrel (MIRENA, 52 MG,) 20 MCG/24HR IUD, Mirena 20 mcg/24 hours (6 yrs) 52 mg intrauterine device  Take 1 device by intrauterine route., Disp: , Rfl:  .  losartan (COZAAR) 50 MG tablet, TAKE 1 TABLET(50 MG) BY MOUTH DAILY, Disp: 90 tablet, Rfl: 0 .  metFORMIN (GLUCOPHAGE-XR) 500 MG 24 hr tablet, TAKE 2 TABLETS BY MOUTH EVERY DAY, Disp: 180 tablet, Rfl: 0 .  metroNIDAZOLE (METROGEL) 0.75 % gel, Apply 1 application topically 2 (two) times daily. For rosacea, Disp: 45 g, Rfl: 1 .  nystatin ointment (MYCOSTATIN), APPLY TOPICALLY AS DIRECTED, Disp: 30 g, Rfl: 0 .  RESTASIS 0.05 % ophthalmic emulsion, Place 1 drop into both eyes every morning. , Disp: , Rfl:  .  rivaroxaban (XARELTO) 20 MG TABS tablet, TAKE 1 TABLET BY MOUTH EVERY DAY WITH SUPPER, Disp: 30 tablet, Rfl: 6 .   vitamin B-12 (CYANOCOBALAMIN) 500 MCG tablet, Take 1,000 mcg by mouth daily., Disp: , Rfl:  .  Zinc Oxide 30.6 % CREA, Apply 1 application topically every morning., Disp: , Rfl:   EXAM:  VITALS per patient if applicable:  GENERAL: alert, oriented, appears well and in no acute distress  HEENT: atraumatic, conjunttiva clear, no obvious abnormalities on inspection of external nose and ears  NECK: normal movements of the head and neck  LUNGS: on inspection no signs of respiratory distress, breathing rate appears normal, no obvious gross SOB, gasping or wheezing  CV: no obvious cyanosis  MS: moves all visible extremities without noticeable abnormality  PSYCH/NEURO: pleasant and cooperative, no obvious depression or anxiety, speech and thought processing grossly intact  ASSESSMENT AND PLAN:  Discussed the following assessment and plan:  1. COVID-19 -I think her sinus symptoms are more of a result of viral illness that she is currently going through.  She does have high blood pressure, advised to stay away from decongestants that may cause her pressure to increase.  She can try Zyrtec, Mucinex, or Benadryl.  Continue with supportive care, rest stay hydrated and can also use a humidifier.  If symptoms do not resolve within 7 to 10 days then follow-up with PCP.     I discussed the assessment and treatment plan with the patient. The patient was provided an opportunity to ask questions and all were answered. The patient agreed with the plan and demonstrated an understanding of the instructions.   The patient was advised to call back or seek an in-person evaluation if the symptoms worsen or if the condition fails to improve as anticipated.   Dorothyann Peng, NP

## 2021-02-01 ENCOUNTER — Other Ambulatory Visit: Payer: Self-pay | Admitting: Internal Medicine

## 2021-02-17 ENCOUNTER — Other Ambulatory Visit: Payer: Self-pay

## 2021-02-17 ENCOUNTER — Ambulatory Visit: Payer: BC Managed Care – PPO | Admitting: Vascular Surgery

## 2021-02-17 ENCOUNTER — Encounter: Payer: Self-pay | Admitting: Vascular Surgery

## 2021-02-17 ENCOUNTER — Ambulatory Visit (HOSPITAL_COMMUNITY)
Admission: RE | Admit: 2021-02-17 | Discharge: 2021-02-17 | Disposition: A | Discharge status: Home / Self Care | Payer: BC Managed Care – PPO | Source: Ambulatory Visit | Attending: Vascular Surgery | Admitting: Vascular Surgery

## 2021-02-17 VITALS — BP 134/72 | HR 101 | Temp 98.0°F | Resp 20 | Ht 59.0 in | Wt 182.0 lb

## 2021-02-17 DIAGNOSIS — M79662 Pain in left lower leg: Secondary | ICD-10-CM | POA: Diagnosis not present

## 2021-02-17 DIAGNOSIS — M79661 Pain in right lower leg: Secondary | ICD-10-CM | POA: Diagnosis not present

## 2021-02-17 DIAGNOSIS — M79606 Pain in leg, unspecified: Secondary | ICD-10-CM | POA: Diagnosis not present

## 2021-02-17 NOTE — Progress Notes (Signed)
[facility]   ASSESSMENT / PLAN: 55 y.o. female with bilateral calf discomfort.  No evidence of hemodynamically significant peripheral arterial disease on physical exam or noninvasive laboratory data today.  I counseled the patient that there are many other possible diagnoses that may explain her calf discomfort, including neurologic, musculoskeletal, deconditioning.  She can follow-up with me as needed.  CHIEF COMPLAINT: Calf pain with walking  HISTORY OF PRESENT ILLNESS: Gail [name] is a 55 y.o. female referred to the clinic for evaluation of calf pain bilaterally with ambulation.  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  She has history of scoliosis and bilateral knee arthritis.  She has noted increasing discomfort in her calves with walking recently.  She suffered a Covid infection in January of this year and has been slow to recover from this.  VASCULAR SURGICAL HISTORY: none  VASCULAR RISK FACTORS: Patient reports: - no history of cerebrovascular disease / stroke / transient ischemic attack. - no history of coronary artery disease.  - + history of diabetes mellitus (A1c 6.1) - no history of smoking.  - + history of hypertension.  - no history of chronic kidney disease  - no history of chronic obstructive pulmonary disease.  Past Medical History:  Diagnosis Date  . Allergy    History of shots as a child  . Anal fissure   . Arthritis   . Diabetes mellitus without complication ([place])   . History of infertility, female   . Hyperlipidemia   . Hypertension   . IBS (irritable bowel syndrome)   . Microscopic hematuria 2017   urology evaluated no sig cause    . PCO (polycystic ovaries)   . Rosacea     Past Surgical History:  Procedure Laterality Date  . A-FLUTTER ABLATION N/A 07/16/2019   Procedure: A-FLUTTER ABLATION;  Surgeon: Evans Lance, MD;  Location: [facility];  Service: Cardiovascular;  Laterality:  N/A;  . ATRIAL TACH ABLATION N/A 07/16/2019   Procedure: ATRIAL TACH ABLATION;  Surgeon: Evans Lance, MD;  Location: [facility];  Service: Cardiovascular;  Laterality: N/A;  . CARPAL TUNNEL RELEASE     x2 bilateral  . KNEE ARTHROSCOPY     left   . WISDOM TOOTH EXTRACTION    . wrist tendon release     bilateral thumbs    Family History  Problem Relation Age of Onset  . Hypertension Mother   . Heart disease Mother   . Hypertension Father   . ALS Father        deceased  . Dementia Maternal Grandfather   . Heart disease Paternal Grandmother   . Dementia Paternal Grandfather   . Hypertension Other   . Lung cancer Other        uncle  . Breast cancer Other        great aunt  . Colon cancer Neg Hx   . Stomach cancer Neg Hx   . Liver cancer Neg Hx   . Rectal cancer Neg Hx   . Esophageal cancer Neg Hx     Social History   Socioeconomic History  . Marital status: Married    Spouse name: Not on file  . Number of children: 0  . Years of education: Not on file  . Highest education level: Not on file  Occupational History  . Occupation: Agricultural consultant: [employer]  Tobacco Use  . Smoking status:  Former Smoker    Packs/day: 0.50    Years: 7.00    Pack years: 3.50    Types: Cigarettes    Quit date: 12/13/1986    Years since quitting: 34.2  . Smokeless tobacco: Never Used  Vaping Use  . Vaping Use: Never used  Substance and Sexual Activity  . Alcohol use: No    Alcohol/week: 0.0 standard drinks  . Drug use: No  . Sexual activity: Not on file    Comment: married  Other Topics Concern  . 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   Social Determinants of Health   Financial Resource Strain: Not on file  Food Insecurity: Not on file  Transportation Needs:  Not on file  Physical Activity: Not on file  Stress: Not on file  Social Connections: Not on file  Intimate Partner Violence: Not on file    Allergies  Allergen Reactions  . Ace Inhibitors Cough  . Erythromycin Ethylsuccinate     Stomach hurts REACTION: unspecified   can take azithromycin  . Doxycycline Rash  . Latex Rash    Current Outpatient Medications  Medication Sig Dispense Refill  . diltiazem (CARDIZEM CD) 360 MG 24 hr capsule Take 1 capsule (360 mg total) by mouth daily. 90 capsule 3  . fluticasone (FLONASE) 50 MCG/ACT nasal spray 2 sprays per nostril daily.  MUST HAVE OFFICE VISIT FOR FURTHER REFILLS. 16 g 0  . Lactobacillus (ACIDOPHILUS PO) Take 1 capsule by mouth at bedtime.    Marland Kitchen levonorgestrel (MIRENA, 52 MG,) 20 MCG/24HR IUD Mirena 20 mcg/24 hours (6 yrs) 52 mg intrauterine device  Take 1 device by intrauterine route.    Marland Kitchen losartan (COZAAR) 50 MG tablet TAKE 1 TABLET(50 MG) BY MOUTH DAILY 90 tablet 0  . metFORMIN (GLUCOPHAGE-XR) 500 MG 24 hr tablet TAKE 2 TABLETS BY MOUTH EVERY DAY (Patient taking differently: Take 500 mg by mouth daily with breakfast.) 180 tablet 0  . metroNIDAZOLE (METROGEL) 0.75 % gel Apply 1 application topically 2 (two) times daily. For rosacea 45 g 1  . nystatin ointment (MYCOSTATIN) APPLY TOPICALLY AS DIRECTED 30 g 0  . RESTASIS 0.05 % ophthalmic emulsion Place 1 drop into both eyes every morning.     . rivaroxaban (XARELTO) 20 MG TABS tablet TAKE 1 TABLET BY MOUTH EVERY DAY WITH SUPPER 30 tablet 6  . vitamin B-12 (CYANOCOBALAMIN) 500 MCG tablet Take 1,000 mcg by mouth daily.    . Zinc Oxide 30.6 % CREA Apply 1 application topically every morning.     No current facility-administered medications for this visit.    REVIEW OF SYSTEMS:  [X]  denotes positive finding, [ ]  denotes negative finding Cardiac  Comments:  Chest pain or chest pressure:    Shortness of breath upon exertion: x   Short of breath when lying flat:    Irregular heart  rhythm: x       Vascular    Pain in calf, thigh, or hip brought on by ambulation: x   Pain in feet at night that wakes you up from your sleep:     Blood clot in your veins:    Leg swelling:  x       Pulmonary  Oxygen at home:    Productive cough:     Wheezing:         Neurologic    Sudden weakness in arms or legs:     Sudden numbness in arms or legs:     Sudden onset of difficulty speaking or slurred speech:    Temporary loss of vision in one eye:     Problems with dizziness:         Gastrointestinal    Blood in stool:     Vomited blood:         Genitourinary    Burning when urinating:     Blood in urine:        Psychiatric    Major depression:         Hematologic    Bleeding problems:    Problems with blood clotting too easily:        Skin    Rashes or ulcers:        Constitutional    Fever or chills:      PHYSICAL EXAM  Vitals:   02/17/21 1422  BP: 134/72  Pulse: (!) 101  Resp: 20  Temp: 98 F (36.7 C)  SpO2: 97%  Weight: 182 lb (82.6 kg)  Height: 4\' 11"  (1.499 m)    Constitutional: well appearing. no distress. Appears well nourished.  Neurologic: CN intact. no focal findings. no sensory loss. Psychiatric: Mood and affect symmetric and appropriate. Eyes: No icterus. No conjunctival pallor. Ears, nose, throat: mucous membranes moist. Midline trachea.  Cardiac: regular rate and rhythm.  Respiratory: unlabored. Abdominal: soft, non-tender, non-distended.  Peripheral vascular:  2+ DP pulses bilaterally Extremity: No edema. No cyanosis. No pallor.  Skin: No gangrene. No ulceration.  Lymphatic: No Stemmer's sign. No palpable lymphadenopathy.  PERTINENT LABORATORY AND RADIOLOGIC DATA  Most recent CBC CBC Latest Ref Rng & Units 03/17/2020 07/13/2019 10/16/2018  WBC 4.0 - 10.5 K/uL 5.6 5.4 10.1  Hemoglobin 12.0 - 15.0 g/dL 14.4 14.4 14.7  Hematocrit 36.0 - 46.0 % 42.2 42.8 46.1(H)  Platelets 150.0 - 400.0 K/uL 231.0 226 239     Most recent  CMP CMP Latest Ref Rng & Units 10/22/2020 03/17/2020 07/13/2019  Glucose 70 - 99 mg/dL - 110(H) 97  BUN 6 - 23 mg/dL - 16 12  Creatinine 0.40 - 1.20 mg/dL - 0.82 0.83  Sodium 135 - 145 mEq/L - 138 139  Potassium 3.5 - 5.1 mEq/L 4.0 4.4 4.4  Chloride 96 - 112 mEq/L - 104 102  CO2 19 - 32 mEq/L - 25 22  Calcium 8.4 - 10.5 mg/dL - 9.2 9.5  Total Protein 6.0 - 8.3 g/dL - 6.9 -  Total Bilirubin 0.2 - 1.2 mg/dL - 0.5 -  Alkaline Phos 39 - 117 U/L - 82 -  AST 0 - 37 U/L - 16 -  ALT 0 - 35 U/L - 17 -    Renal function CrCl cannot be calculated (Patient's most recent lab result is older than the maximum 21 days allowed.).  Hgb A1c MFr Bld (%)  Date Value  10/22/2020 6.1    LDL Cholesterol  Date Value Ref Range Status  03/17/2020 135 (H) 0 - 99 mg/dL Final   Direct LDL  Date Value Ref Range Status  12/10/2010 160.7 mg/dL Final    Comment:    See lab report for associated comment(s)     Vascular Imaging: ABI 02/17/21   Yevonne Aline. Stanford Breed, MD [facility]  Phone Number: [phone] 02/17/2021 5:24 PM

## 2021-02-26 ENCOUNTER — Telehealth: Payer: Self-pay | Admitting: Internal Medicine

## 2021-02-26 NOTE — Telephone Encounter (Signed)
Patient informed of the message bellow. Patient states that she will get a PCR test and will inform PCP of the results. Patient also said that she discussed positive test with triage nurse this morning and was advised to try Mucinex for congestion.

## 2021-02-26 NOTE — Telephone Encounter (Signed)
Pt is calling in on January 15,2022 had a positive COVID test and did the required qurantine and did another test and it was negative so she was able to go back to work and now she is feeling stuffy and coughing and has done another at home test and it is positive and she was wanting to know if she can really be positive again.  Pt was transferred to the triage nurse for further assistance since she did not want to make an appointment.

## 2021-02-26 NOTE — Telephone Encounter (Signed)
Hard to tell if it is a false positive or she just is got a different infection with a positive test.( and will  rsolve it quickly)  I suggest she get a PCR test even though they are very sensitive to make sure it was not a false positive.  And then go from there.    Seems like she should isolate temprarily until getting test result

## 2021-02-27 DIAGNOSIS — Z20822 Contact with and (suspected) exposure to covid-19: Secondary | ICD-10-CM | POA: Diagnosis not present

## 2021-03-02 NOTE — Telephone Encounter (Signed)
Maybe   Take your flonase in case allergy adding to problem  Get her on my schedule for video visit to decide on next step  Tomorrow or wednesday

## 2021-03-02 NOTE — Telephone Encounter (Signed)
Patient stated that she is currently using Flonase daily. Video visit scheduled for 3/23 at 12:30.

## 2021-03-04 ENCOUNTER — Encounter: Payer: Self-pay | Admitting: Internal Medicine

## 2021-03-04 ENCOUNTER — Telehealth (INDEPENDENT_AMBULATORY_CARE_PROVIDER_SITE_OTHER): Payer: BC Managed Care – PPO | Admitting: Internal Medicine

## 2021-03-04 DIAGNOSIS — Z8616 Personal history of COVID-19: Secondary | ICD-10-CM

## 2021-03-04 DIAGNOSIS — J019 Acute sinusitis, unspecified: Secondary | ICD-10-CM

## 2021-03-04 MED ORDER — AMOXICILLIN-POT CLAVULANATE 875-125 MG PO TABS: 1.0000 | ORAL_TABLET | Freq: Two times a day (BID) | ORAL | 0 refills | Status: DC

## 2021-03-04 NOTE — Progress Notes (Signed)
Virtual Visit via Video Note  I connected with@ on 03/04/21 at 12:30 PM EDT by a video enabled telemedicine application and verified that I am speaking with the correct person using two identifiers. Location patient: Work Environmental manager  office Persons participating in the virtual visit: patient, provider  [place] national recommendations  regarding [place] 19 pandemic   video visit is advised over in office visit for this patient.  Patient aware  of the limitations of evaluation and management by telemedicine and  availability of in person appointments. and agreed to proceed.   HPI: Gail [name] presents for video visit see message.  Had COVID in this past year and then recently had a positive antigen test someone in the workplace.  Did a PCR that was negative. She has had some chronic congestion and mucus nose sinuses and throat clearing since she had COVID but it improved.  However became bad and worsening last week 9 to 10 days ago.  No specific face pain or fever but increased throat clearing gobs of mucus and discharge when she blows green-yellow although waxes and wanes. In the past has had secondary sinus infections and wants to treat early if appropriate. No fever or serious pain..      ROS: See pertinent positives and negatives per HPI.  Past Medical History:  Diagnosis Date  . Allergy    History of shots as a child  . Anal fissure   . Arthritis   . Diabetes mellitus without complication ([place])   . History of infertility, female   . Hyperlipidemia   . Hypertension   . IBS (irritable bowel syndrome)   . Microscopic hematuria 2017   urology evaluated no sig cause    . PCO (polycystic ovaries)   . Rosacea     Past Surgical History:  Procedure Laterality Date  . A-FLUTTER ABLATION N/A 07/16/2019   Procedure: A-FLUTTER ABLATION;  Surgeon: Evans Lance, MD;  Location: [facility];  Service: Cardiovascular;  Laterality: N/A;  . ATRIAL TACH ABLATION N/A  07/16/2019   Procedure: ATRIAL TACH ABLATION;  Surgeon: Evans Lance, MD;  Location: [place];  Service: Cardiovascular;  Laterality: N/A;  . CARPAL TUNNEL RELEASE     x2 bilateral  . KNEE ARTHROSCOPY     left   . WISDOM TOOTH EXTRACTION    . wrist tendon release     bilateral thumbs    Family History  Problem Relation Age of Onset  . Hypertension Mother   . Heart disease Mother   . Hypertension Father   . ALS Father        deceased  . Dementia Maternal Grandfather   . Heart disease Paternal Grandmother   . Dementia Paternal Grandfather   . Hypertension Other   . Lung cancer Other        uncle  . Breast cancer Other        great aunt  . Colon cancer Neg Hx   . Stomach cancer Neg Hx   . Liver cancer Neg Hx   . Rectal cancer Neg Hx   . Esophageal cancer Neg Hx     Social History   Tobacco Use  . Smoking status: Former Smoker    Packs/day: 0.50    Years: 7.00    Pack years: 3.50    Types: Cigarettes    Quit date: 12/13/1986    Years since quitting: 34.2  . Smokeless tobacco: Never Used  Vaping Use  .  Vaping Use: Never used  Substance Use Topics  . Alcohol use: No    Alcohol/week: 0.0 standard drinks  . Drug use: No      Current Outpatient Medications:  .  amoxicillin-clavulanate (AUGMENTIN) 875-125 MG tablet, Take 1 tablet by mouth every 12 (twelve) hours., Disp: 14 tablet, Rfl: 0 .  diltiazem (CARDIZEM CD) 360 MG 24 hr capsule, Take 1 capsule (360 mg total) by mouth daily., Disp: 90 capsule, Rfl: 3 .  fluticasone (FLONASE) 50 MCG/ACT nasal spray, 2 sprays per nostril daily.  MUST HAVE OFFICE VISIT FOR FURTHER REFILLS., Disp: 16 g, Rfl: 0 .  Lactobacillus (ACIDOPHILUS PO), Take 1 capsule by mouth at bedtime., Disp: , Rfl:  .  levonorgestrel (MIRENA, 52 MG,) 20 MCG/24HR IUD, Mirena 20 mcg/24 hours (6 yrs) 52 mg intrauterine device  Take 1 device by intrauterine route., Disp: , Rfl:  .  losartan (COZAAR) 50 MG tablet, TAKE 1 TABLET(50 MG) BY MOUTH  DAILY, Disp: 90 tablet, Rfl: 0 .  metFORMIN (GLUCOPHAGE-XR) 500 MG 24 hr tablet, TAKE 2 TABLETS BY MOUTH EVERY DAY (Patient taking differently: Take 500 mg by mouth daily with breakfast.), Disp: 180 tablet, Rfl: 0 .  metroNIDAZOLE (METROGEL) 0.75 % gel, Apply 1 application topically 2 (two) times daily. For rosacea, Disp: 45 g, Rfl: 1 .  nystatin ointment (MYCOSTATIN), APPLY TOPICALLY AS DIRECTED, Disp: 30 g, Rfl: 0 .  RESTASIS 0.05 % ophthalmic emulsion, Place 1 drop into both eyes every morning. , Disp: , Rfl:  .  rivaroxaban (XARELTO) 20 MG TABS tablet, TAKE 1 TABLET BY MOUTH EVERY DAY WITH SUPPER, Disp: 30 tablet, Rfl: 6 .  vitamin B-12 (CYANOCOBALAMIN) 500 MCG tablet, Take 1,000 mcg by mouth daily., Disp: , Rfl:  .  Zinc Oxide 30.6 % CREA, Apply 1 application topically every morning., Disp: , Rfl:   EXAM: BP Readings from Last 3 Encounters:  02/17/21 134/72  11/18/20 126/70  05/28/20 110/66    VITALS per patient if applicable:  GENERAL: alert, oriented, appears well and in no acute distress peers congested is at work no facial edema no respiratory compromise nontoxic  HEENT: atraumatic, conjunttiva clear, no obvious abnormalities on inspection of external nose and ears  NECK: normal movements of the head and neck  LUNGS: on inspection no signs of respiratory distress, breathing rate appears normal, no obvious gross SOB, gasping or wheezing  CV: no obvious cyanosis  MS: moves all visible extremities without noticeable abnormality  PSYCH/NEURO: pleasant and cooperative, no obvious depression or anxiety, speech and thought processing grossly intact Lab Results  Component Value Date   WBC 5.6 03/17/2020   HGB 14.4 03/17/2020   HCT 42.2 03/17/2020   PLT 231.0 03/17/2020   GLUCOSE 110 (H) 03/17/2020   CHOL 196 03/17/2020   TRIG 55.0 03/17/2020   HDL 49.40 03/17/2020   LDLDIRECT 160.7 12/10/2010   LDLCALC 135 (H) 03/17/2020   ALT 17 03/17/2020   AST 16 03/17/2020   NA 138  03/17/2020   K 4.0 10/22/2020   CL 104 03/17/2020   CREATININE 0.82 03/17/2020   BUN 16 03/17/2020   CO2 25 03/17/2020   TSH 2.18 03/17/2020   HGBA1C 6.1 10/22/2020    ASSESSMENT AND PLAN:  Discussed the following assessment and plan:    ICD-10-CM   1. Acute rhinosinusitis  J01.90   2. Personal history of COVID-19  Z86.16    Ongoing recovering nasal congestion since Covid a couple months ago but worsening over the last week to  10 days with findings consistent with secondary sinusitis. 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  Follow-up with alarm symptoms or failure to improve.  Or as indicated Counseled.   Expectant management and discussion of plan and treatment with opportunity to ask questions and all were answered. The patient agreed with the plan and demonstrated an understanding of the instructions.   Advised to call back or seek an in-person evaluation if worsening  or having  further concerns . Return if symptoms worsen or fail to improve as expected.   Shanon Ace, MD

## 2021-03-19 DIAGNOSIS — Z713 Dietary counseling and surveillance: Secondary | ICD-10-CM | POA: Diagnosis not present

## 2021-03-29 ENCOUNTER — Other Ambulatory Visit: Payer: Self-pay | Admitting: Internal Medicine

## 2021-03-31 ENCOUNTER — Other Ambulatory Visit: Payer: Self-pay | Admitting: Internal Medicine

## 2021-04-02 NOTE — Telephone Encounter (Signed)
Prescription refill request for Xarelto received.  Indication: PAF Last office visit: 11/18/20 Weight: 83.2kg Age: 55 Scr: 0.82 on 03/17/20 CrCl: 105.41  Based on above findings Xarelto 20mg  daily is the appropriate dose.  Refill approved.  Pt is past due for lab work.  Message sent to Willeen Cass, RN to arrange for pt to have labs.

## 2021-04-03 DIAGNOSIS — Z713 Dietary counseling and surveillance: Secondary | ICD-10-CM | POA: Diagnosis not present

## 2021-04-08 DIAGNOSIS — I480 Paroxysmal atrial fibrillation: Secondary | ICD-10-CM

## 2021-04-15 ENCOUNTER — Other Ambulatory Visit: Payer: BC Managed Care – PPO | Admitting: *Deleted

## 2021-04-15 ENCOUNTER — Other Ambulatory Visit: Payer: Self-pay

## 2021-04-15 DIAGNOSIS — I480 Paroxysmal atrial fibrillation: Secondary | ICD-10-CM | POA: Diagnosis not present

## 2021-04-16 LAB — BASIC METABOLIC PANEL
BUN/Creatinine Ratio: 31 — ABNORMAL HIGH (ref 9–23)
BUN: 23 mg/dL (ref 6–24)
CO2: 22 mmol/L (ref 20–29)
Calcium: 9.4 mg/dL (ref 8.7–10.2)
Chloride: 103 mmol/L (ref 96–106)
Creatinine, Ser: 0.74 mg/dL (ref 0.57–1.00)
Glucose: 83 mg/dL (ref 65–99)
Potassium: 4.6 mmol/L (ref 3.5–5.2)
Sodium: 141 mmol/L (ref 134–144)
eGFR: 97 mL/min/{1.73_m2} (ref >59)

## 2021-04-16 LAB — CBC WITH DIFFERENTIAL/PLATELET
Basophils Absolute: 0.1 10*3/uL (ref 0.0–0.2)
Basos: 1 %
EOS (ABSOLUTE): 0.2 10*3/uL (ref 0.0–0.4)
Eos: 3 %
Hematocrit: 42.1 % (ref 34.0–46.6)
Hemoglobin: 14.2 g/dL (ref 11.1–15.9)
Immature Grans (Abs): 0 10*3/uL (ref 0.0–0.1)
Immature Granulocytes: 0 %
Lymphocytes Absolute: 2 10*3/uL (ref 0.7–3.1)
Lymphs: 30 %
MCH: 32 pg (ref 26.6–33.0)
MCHC: 33.7 g/dL (ref 31.5–35.7)
MCV: 95 fL (ref 79–97)
Monocytes Absolute: 0.7 10*3/uL (ref 0.1–0.9)
Monocytes: 10 %
Neutrophils Absolute: 3.7 10*3/uL (ref 1.4–7.0)
Neutrophils: 56 %
Platelets: 252 10*3/uL (ref 150–450)
RBC: 4.44 x10E6/uL (ref 3.77–5.28)
RDW: 12.5 % (ref 11.7–15.4)
WBC: 6.6 10*3/uL (ref 3.4–10.8)

## 2021-04-21 DIAGNOSIS — Z713 Dietary counseling and surveillance: Secondary | ICD-10-CM | POA: Diagnosis not present

## 2021-05-08 DIAGNOSIS — Z713 Dietary counseling and surveillance: Secondary | ICD-10-CM | POA: Diagnosis not present

## 2021-06-01 DIAGNOSIS — Z713 Dietary counseling and surveillance: Secondary | ICD-10-CM | POA: Diagnosis not present

## 2021-06-04 ENCOUNTER — Other Ambulatory Visit: Payer: Self-pay | Admitting: Obstetrics and Gynecology

## 2021-06-04 DIAGNOSIS — Z01419 Encounter for gynecological examination (general) (routine) without abnormal findings: Secondary | ICD-10-CM | POA: Diagnosis not present

## 2021-06-04 DIAGNOSIS — Z130 Encounter for screening for diseases of the blood and blood-forming organs and certain disorders involving the immune mechanism: Secondary | ICD-10-CM | POA: Diagnosis not present

## 2021-06-04 DIAGNOSIS — Z124 Encounter for screening for malignant neoplasm of cervix: Secondary | ICD-10-CM | POA: Diagnosis not present

## 2021-06-04 DIAGNOSIS — Z1151 Encounter for screening for human papillomavirus (HPV): Secondary | ICD-10-CM | POA: Diagnosis not present

## 2021-06-04 DIAGNOSIS — Z1231 Encounter for screening mammogram for malignant neoplasm of breast: Secondary | ICD-10-CM

## 2021-06-05 DIAGNOSIS — Z124 Encounter for screening for malignant neoplasm of cervix: Secondary | ICD-10-CM | POA: Diagnosis not present

## 2021-06-05 DIAGNOSIS — Z1151 Encounter for screening for human papillomavirus (HPV): Secondary | ICD-10-CM | POA: Diagnosis not present

## 2021-06-23 DIAGNOSIS — M25512 Pain in left shoulder: Secondary | ICD-10-CM | POA: Diagnosis not present

## 2021-06-29 ENCOUNTER — Other Ambulatory Visit: Payer: Self-pay | Admitting: Internal Medicine

## 2021-06-29 NOTE — Telephone Encounter (Signed)
Prescription refill request for Xarelto received.  Indication: atrial flutter Last office visit: Gail [name] 11/18/2020 Weight: 82.6kg Age: 55 yo  Scr: 0.74, 04/15/2020 CrCl: 177ml/min   Pt is on the correct dose of Xarelto per dosing criteria, prescription refill sent for Xarelto 20mg  daily.

## 2021-06-30 DIAGNOSIS — Z713 Dietary counseling and surveillance: Secondary | ICD-10-CM | POA: Diagnosis not present

## 2021-07-23 DIAGNOSIS — M67912 Unspecified disorder of synovium and tendon, left shoulder: Secondary | ICD-10-CM | POA: Diagnosis not present

## 2021-07-23 DIAGNOSIS — M67911 Unspecified disorder of synovium and tendon, right shoulder: Secondary | ICD-10-CM | POA: Diagnosis not present

## 2021-07-28 ENCOUNTER — Other Ambulatory Visit: Payer: Self-pay | Admitting: Orthopedic Surgery

## 2021-07-28 DIAGNOSIS — M25512 Pain in left shoulder: Secondary | ICD-10-CM

## 2021-07-29 ENCOUNTER — Ambulatory Visit
Admission: RE | Admit: 2021-07-29 | Discharge: 2021-07-29 | Disposition: A | Discharge status: Home / Self Care | Payer: BC Managed Care – PPO | Source: Ambulatory Visit | Attending: Obstetrics and Gynecology | Admitting: Obstetrics and Gynecology

## 2021-07-29 ENCOUNTER — Ambulatory Visit: Payer: BC Managed Care – PPO

## 2021-07-29 ENCOUNTER — Other Ambulatory Visit: Payer: Self-pay

## 2021-07-29 DIAGNOSIS — Z1231 Encounter for screening mammogram for malignant neoplasm of breast: Secondary | ICD-10-CM

## 2021-07-31 ENCOUNTER — Other Ambulatory Visit: Payer: Self-pay | Admitting: Orthopedic Surgery

## 2021-07-31 DIAGNOSIS — M75121 Complete rotator cuff tear or rupture of right shoulder, not specified as traumatic: Secondary | ICD-10-CM | POA: Diagnosis not present

## 2021-07-31 DIAGNOSIS — M25512 Pain in left shoulder: Secondary | ICD-10-CM

## 2021-08-01 ENCOUNTER — Ambulatory Visit
Admission: RE | Admit: 2021-08-01 | Discharge: 2021-08-01 | Disposition: A | Discharge status: Home / Self Care | Payer: BC Managed Care – PPO | Source: Ambulatory Visit | Attending: Orthopedic Surgery | Admitting: Orthopedic Surgery

## 2021-08-01 DIAGNOSIS — M75111 Incomplete rotator cuff tear or rupture of right shoulder, not specified as traumatic: Secondary | ICD-10-CM | POA: Diagnosis not present

## 2021-08-01 DIAGNOSIS — M7551 Bursitis of right shoulder: Secondary | ICD-10-CM | POA: Diagnosis not present

## 2021-08-01 DIAGNOSIS — M75121 Complete rotator cuff tear or rupture of right shoulder, not specified as traumatic: Secondary | ICD-10-CM

## 2021-08-04 ENCOUNTER — Ambulatory Visit: Payer: BC Managed Care – PPO

## 2021-08-04 DIAGNOSIS — M67911 Unspecified disorder of synovium and tendon, right shoulder: Secondary | ICD-10-CM | POA: Diagnosis not present

## 2021-08-04 DIAGNOSIS — M67912 Unspecified disorder of synovium and tendon, left shoulder: Secondary | ICD-10-CM | POA: Diagnosis not present

## 2021-08-10 DIAGNOSIS — Z713 Dietary counseling and surveillance: Secondary | ICD-10-CM | POA: Diagnosis not present

## 2021-08-13 DIAGNOSIS — M67911 Unspecified disorder of synovium and tendon, right shoulder: Secondary | ICD-10-CM | POA: Diagnosis not present

## 2021-08-13 DIAGNOSIS — M6281 Muscle weakness (generalized): Secondary | ICD-10-CM | POA: Diagnosis not present

## 2021-08-13 DIAGNOSIS — M25611 Stiffness of right shoulder, not elsewhere classified: Secondary | ICD-10-CM | POA: Diagnosis not present

## 2021-08-25 DIAGNOSIS — M25511 Pain in right shoulder: Secondary | ICD-10-CM | POA: Diagnosis not present

## 2021-08-25 DIAGNOSIS — M67911 Unspecified disorder of synovium and tendon, right shoulder: Secondary | ICD-10-CM | POA: Diagnosis not present

## 2021-08-25 DIAGNOSIS — M25512 Pain in left shoulder: Secondary | ICD-10-CM | POA: Diagnosis not present

## 2021-08-25 DIAGNOSIS — M67912 Unspecified disorder of synovium and tendon, left shoulder: Secondary | ICD-10-CM | POA: Diagnosis not present

## 2021-09-01 ENCOUNTER — Telehealth: Payer: Self-pay | Admitting: *Deleted

## 2021-09-01 NOTE — Telephone Encounter (Signed)
   [place] Pre-operative Risk Assessment    Patient Name: Gail [name]  DOB: 03-May-1966 MRN: [id number]  HEARTCARE STAFF:  - IMPORTANT!!!!!! Under Visit Info/Reason for Call, type in Other and utilize the format Clearance MM/DD/YY or Clearance TBD. Do not use dashes or single digits. - Please review there is not already an duplicate clearance open for this procedure. - If request is for dental extraction, please clarify the # of teeth to be extracted. - If the patient is currently at the dentist's office, call Pre-Op Callback Staff (MA/nurse) to input urgent request.  - If the patient is not currently in the dentist office, please route to the Pre-Op pool.  Request for surgical clearance:  What type of surgery is being performed?  RIGHT SHOULDER ARTHROSCOPY   When is this surgery scheduled?  10/07/21  What type of clearance is required (medical clearance vs. Pharmacy clearance to hold med vs. Both)?  BOTH  Are there any medications that need to be held prior to surgery and how long?  [facility]  Practice name and name of physician performing surgery?  [facility] / DR. Dannette Barbara  What is the office phone number?  [phone]   7.   What is the office fax number?  [phone]  8.   Anesthesia type (None, local, MAC, general) ?    Jeanann Lewandowsky 09/01/2021, 2:18 PM  _________________________________________________________________   (provider comments below)

## 2021-09-02 NOTE — Telephone Encounter (Signed)
   Name: Gail [name]  DOB: Mar 08, 1966  MRN: [id number]   Primary Cardiologist: [name], MD  Chart reviewed as part of pre-operative protocol coverage. Patient was contacted 09/02/2021 in reference to pre-operative risk assessment for pending surgery as outlined below.  Gail [name] was last seen on 11/2020 by Dr. Lovena Le with hx of PAF/flutter, PVCs outlined amongst other hx reviewed. Prior echo 2012 showed normal LVEF. No prior hx CAD. RCRI 0.4% indicating low CV risk. I reached out to patient for update on how she is doing. The patient affirms she has been doing well without any new cardiac symptoms, able to exert herself without any new angina or dyspnea. Has chronic occasional palpitations without recent change. Therefore, based on ACC/AHA guidelines, the patient would be at acceptable risk for the planned procedure without further cardiovascular testing. The patient was advised that if she develops new symptoms prior to surgery to contact our office to arrange for a follow-up visit, and she verbalized understanding.  Per PharmD, Per office protocol, patient can hold Xarelto for 2 days prior to procedure.    I will route this recommendation to the requesting party via Epic fax function and remove from pre-op pool. Please call with questions.  Charlie Pitter, PA-C 09/02/2021, 5:08 PM

## 2021-09-02 NOTE — Telephone Encounter (Signed)
Will route to pharm team then patient will need call.

## 2021-09-02 NOTE — Telephone Encounter (Signed)
Patient with diagnosis of afib on Xarelto for anticoagulation.    Procedure: RIGHT SHOULDER ARTHROSCOPY  Date of procedure: 10/07/21  CHA2DS2-VASc Score = 3   This indicates a 3.2% annual risk of stroke. The patient's score is based upon: CHF History: 0 HTN History: 1 Diabetes History: 1 Stroke History: 0 Vascular Disease History: 0 Age Score: 0 Gender Score: 1    CrCl 81.8 ml/min  Per office protocol, patient can hold Xarelto for 2 days prior to procedure.

## 2021-09-04 DIAGNOSIS — M6281 Muscle weakness (generalized): Secondary | ICD-10-CM | POA: Diagnosis not present

## 2021-09-04 DIAGNOSIS — M67911 Unspecified disorder of synovium and tendon, right shoulder: Secondary | ICD-10-CM | POA: Diagnosis not present

## 2021-09-04 DIAGNOSIS — M25611 Stiffness of right shoulder, not elsewhere classified: Secondary | ICD-10-CM | POA: Diagnosis not present

## 2021-09-15 ENCOUNTER — Telehealth (INDEPENDENT_AMBULATORY_CARE_PROVIDER_SITE_OTHER): Payer: BC Managed Care – PPO | Admitting: Family Medicine

## 2021-09-15 ENCOUNTER — Other Ambulatory Visit: Payer: Self-pay | Admitting: Internal Medicine

## 2021-09-15 ENCOUNTER — Encounter: Payer: Self-pay | Admitting: Family Medicine

## 2021-09-15 VITALS — Wt 175.0 lb

## 2021-09-15 DIAGNOSIS — B3731 Acute candidiasis of vulva and vagina: Secondary | ICD-10-CM

## 2021-09-15 DIAGNOSIS — R0981 Nasal congestion: Secondary | ICD-10-CM | POA: Diagnosis not present

## 2021-09-15 MED ORDER — FLUCONAZOLE 150 MG PO TABS: 150.0000 mg | ORAL_TABLET | Freq: Once | ORAL | 0 refills | Status: AC

## 2021-09-15 MED ORDER — AMOXICILLIN-POT CLAVULANATE 875-125 MG PO TABS: 1.0000 | ORAL_TABLET | Freq: Two times a day (BID) | ORAL | 0 refills | Status: DC

## 2021-09-15 NOTE — Progress Notes (Addendum)
Virtual Visit via Telephone Note  I connected with Gail [name] on 09/15/21 at 11:40 AM EDT by telephone and verified that I am speaking with the correct person using two identifiers.   I discussed the limitations, risks, security and privacy concerns of performing an evaluation and management service by telephone and the availability of in person appointments. I also discussed with the patient that there may be a patient responsible charge related to this service. The patient expressed understanding and agreed to proceed.  Location patient: home, [place] Location provider: work or home office Participants present for the call: patient, provider Patient did not have a visit with me in the prior 7 days to address this/these issue(s).   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 -Denies: fevers, CP, SOB, NVD, body aches, inability to eat/drink/get out of bed -Pertinent past medical history: see below, has had covid twice -Pertinent medication allergies:  Allergies  Allergen Reactions   Ace Inhibitors Cough   Erythromycin Ethylsuccinate     Stomach hurts REACTION: unspecified   can take azithromycin   Doxycycline Rash   Latex Rash  -COVID-19 vaccine status: vaccinated x2 and has had covid twice -she plans to see and ENT as has had several sinus infections -she wants diflucan as usually requires for yeast vaginitis following abx use  Past Medical History:  Diagnosis Date   Allergy    History of shots as a child   Anal fissure    Arthritis    Diabetes mellitus without complication ([place])     History of infertility, female    Hyperlipidemia    Hypertension    IBS (irritable bowel syndrome)    Microscopic hematuria 2017   urology evaluated no sig cause     PCO (polycystic ovaries)    Rosacea       Observations/Objective: Patient sounds cheerful and well on the phone. I do not appreciate any SOB. Speech and thought processing are grossly intact. Patient reported vitals:  Assessment and Plan:  Nasal sinus congestion  Yeast vaginitis  -we discussed possible serious and likely etiologies, options for evaluation and workup, limitations of telemedicine visit vs in person visit, treatment, treatment risks and precautions. Pt prefers to treat via telemedicine empirically rather than in person at this moment. Query sinusitis vs other. She prefers to treat empirically with Augmentin. Advised saline, short course of nasal decongestant, avoid dairy and repeat covid testing as well. Sent diflucan for possible yeast vaginitis if needed as well.   Advised to seek prompt in person care if worsening, new symptoms arise, or if is not improving with treatment. Advised of options for inperson care in case PCP office not available. Did let the patient know that I only do telemedicine shifts for [facility] on Tuesdays and Thursdays and advised a follow up visit with PCP or at an [facility] if has further questions or concerns.   Follow Up Instructions:  I did not refer this patient for an OV with me in the next 24 hours for this/these issue(s).  I discussed the assessment and treatment  plan with the patient. The patient was provided an opportunity to ask questions and all were answered. The patient agreed with the plan and demonstrated an understanding of the instructions.   I spent 17 minutes on the date of this visit in the care of this patient. See summary of tasks completed to properly care for this patient in the detailed notes above which also included counseling of above, review of PMH,  medications, allergies, evaluation of the patient and ordering and/or  instructing patient on testing and care options.     Lucretia Kern, DO

## 2021-09-15 NOTE — Addendum Note (Signed)
Addended by: Colin Benton R on: 09/15/2021 12:00 PM   Modules accepted: Orders, Level of Service

## 2021-09-15 NOTE — Patient Instructions (Addendum)
-  I sent the medication(s) we discussed to your pharmacy: Meds ordered this encounter  Medications   amoxicillin-clavulanate (AUGMENTIN) 875-125 MG tablet    Sig: Take 1 tablet by mouth 2 (two) times daily.    Dispense:  20 tablet    Refill:  0    Do another covid tests as well.  Nasal saline twice daily.  Avoid dairy products.  Could try Afrin for 3 days.  I hope you are feeling better soon!  Seek in person care promptly if your symptoms worsen, new concerns arise or you are not improving with treatment.  It was nice to meet you today. I help [facility] out with telemedicine visits on Tuesdays and Thursdays and am available for visits on those days. If you have any concerns or questions following this visit please schedule a follow up visit with your Primary Care doctor or seek care at a local urgent care clinic to avoid delays in care.

## 2021-10-03 ENCOUNTER — Other Ambulatory Visit: Payer: Self-pay

## 2021-10-05 MED ORDER — LOSARTAN POTASSIUM 50 MG PO TABS: 50.0000 mg | ORAL_TABLET | Freq: Every day | ORAL | 0 refills | Status: DC

## 2021-10-05 NOTE — Telephone Encounter (Signed)
Patient called to schedule appointment for medication refills and ask for a temporary refill on the losartan (COZAAR) 50 MG tablet. Patient does have appointment scheduled for 11/2, but is out of medication      Please send to [facility], [place] - [address]      Please advise

## 2021-10-06 DIAGNOSIS — Z713 Dietary counseling and surveillance: Secondary | ICD-10-CM | POA: Diagnosis not present

## 2021-10-14 ENCOUNTER — Encounter: Payer: Self-pay | Admitting: Internal Medicine

## 2021-10-14 ENCOUNTER — Telehealth (INDEPENDENT_AMBULATORY_CARE_PROVIDER_SITE_OTHER): Payer: BC Managed Care – PPO | Admitting: Internal Medicine

## 2021-10-14 VITALS — Ht 59.0 in | Wt 175.0 lb

## 2021-10-14 DIAGNOSIS — R7301 Impaired fasting glucose: Secondary | ICD-10-CM | POA: Diagnosis not present

## 2021-10-14 DIAGNOSIS — M25511 Pain in right shoulder: Secondary | ICD-10-CM

## 2021-10-14 DIAGNOSIS — Z7901 Long term (current) use of anticoagulants: Secondary | ICD-10-CM | POA: Diagnosis not present

## 2021-10-14 DIAGNOSIS — I10 Essential (primary) hypertension: Secondary | ICD-10-CM | POA: Diagnosis not present

## 2021-10-14 DIAGNOSIS — M25512 Pain in left shoulder: Secondary | ICD-10-CM

## 2021-10-14 DIAGNOSIS — Z8616 Personal history of COVID-19: Secondary | ICD-10-CM

## 2021-10-14 DIAGNOSIS — Z79899 Other long term (current) drug therapy: Secondary | ICD-10-CM | POA: Diagnosis not present

## 2021-10-14 DIAGNOSIS — G8929 Other chronic pain: Secondary | ICD-10-CM

## 2021-10-14 DIAGNOSIS — F439 Reaction to severe stress, unspecified: Secondary | ICD-10-CM

## 2021-10-14 DIAGNOSIS — E785 Hyperlipidemia, unspecified: Secondary | ICD-10-CM

## 2021-10-14 MED ORDER — METFORMIN HCL ER 500 MG PO TB24: 500.0000 mg | ORAL_TABLET | Freq: Every day | ORAL | 3 refills | Status: DC

## 2021-10-14 NOTE — Progress Notes (Signed)
Virtual Visit via Video Note  I connected with Gail [name] NAME@ on 10/14/21 at  4:00 PM EDT by a video enabled telemedicine application and verified that I am speaking with the correct person using two identifiers. Location patient: [employer] provider:work  office Persons participating in the virtual visit: patient, provider  [place] national recommendations  regarding COVID 19 pandemic   video visit is advised over in office visit for this patient.  Patient aware  of the limitations of evaluation and management by telemedicine and  availability of in person appointments. and agreed to proceed.   HPI: Gail [name] presents for video visit finished with a audio visit because of technical difficulties sound okay but visual is choppy. [name] needs med check as it is been a year she is on metformin 500 mg once a day seemingly no side effects.  This has been for hyperglycemia PCOS insulin resistance.  Also on losartan for hypertension also sees Dr. Lovena Le for her intermittent A. fib on anticoagulation and diltiazem.  Recently she has been battling problems with her shoulders it is quite painful and difficult for her after a fall in May June time.  She may have to have surgery trying to delay it until January but it is painful at times.  She had some sinus congestion did not have to take the antibiotic that Dr. Maudie Mercury had prescribed but did have an episode of blood that ran down the back of her throat after some nasal irritation and an episode of crying because of pain and difficulty in the morning.  No other bleeding. She is quite stressed worried about what caused her fall as her father had ALS.  Has some mild menopausal symptoms work is been busy with new children.  She has an IUD through menopause.  ROS: See pertinent positives and negatives per HPI.  Past Medical History:  Diagnosis Date   Allergy    History of shots as a child   Anal fissure    Arthritis    Diabetes  mellitus without complication ([place])    History of infertility, female    Hyperlipidemia    Hypertension    IBS (irritable bowel syndrome)    Microscopic hematuria 2017   urology evaluated no sig cause     PCO (polycystic ovaries)    Rosacea     Past Surgical History:  Procedure Laterality Date   A-FLUTTER ABLATION N/A 07/16/2019   Procedure: A-FLUTTER ABLATION;  Surgeon: Evans Lance, MD;  Location: [facility];  Service: Cardiovascular;  Laterality: N/A;   ATRIAL TACH ABLATION N/A 07/16/2019   Procedure: ATRIAL TACH ABLATION;  Surgeon: Evans Lance, MD;  Location: [facility];  Service: Cardiovascular;  Laterality: N/A;   CARPAL TUNNEL RELEASE     x2 bilateral   KNEE ARTHROSCOPY     left    WISDOM TOOTH EXTRACTION     wrist tendon release     bilateral thumbs    Family History  Problem Relation Age of Onset   Hypertension Mother    Heart disease Mother    Hypertension Father    ALS Father        deceased   Dementia Maternal Grandfather    Heart disease Paternal Grandmother    Dementia Paternal Grandfather    Hypertension Other    Lung cancer Other        uncle   Breast cancer Other        great  aunt   Colon cancer Neg Hx    Stomach cancer Neg Hx    Liver cancer Neg Hx    Rectal cancer Neg Hx    Esophageal cancer Neg Hx     Social History   Tobacco Use   Smoking status: Former    Packs/day: 0.50    Years: 7.00    Pack years: 3.50    Types: Cigarettes    Quit date: 12/13/1986    Years since quitting: 34.8   Smokeless tobacco: Never  Vaping Use   Vaping Use: Never used  Substance Use Topics   Alcohol use: No    Alcohol/week: 0.0 standard drinks   Drug use: No      Current Outpatient Medications:    amoxicillin-clavulanate (AUGMENTIN) 875-125 MG tablet, Take 1 tablet by mouth 2 (two) times daily., Disp: 20 tablet, Rfl: 0   diltiazem (CARDIZEM CD) 360 MG 24 hr capsule, TAKE 1 CAPSULE(360 MG) BY MOUTH DAILY, Disp: 90 capsule, Rfl: 2    fluticasone (FLONASE) 50 MCG/ACT nasal spray, 2 sprays per nostril daily.  MUST HAVE OFFICE VISIT FOR FURTHER REFILLS., Disp: 16 g, Rfl: 0   Lactobacillus (ACIDOPHILUS PO), Take 1 capsule by mouth at bedtime., Disp: , Rfl:    levonorgestrel (MIRENA, 52 MG,) 20 MCG/24HR IUD, Mirena 20 mcg/24 hours (6 yrs) 52 mg intrauterine device  Take 1 device by intrauterine route., Disp: , Rfl:    losartan (COZAAR) 50 MG tablet, Take 1 tablet (50 mg total) by mouth daily., Disp: 90 tablet, Rfl: 0   metroNIDAZOLE (METROGEL) 0.75 % gel, Apply 1 application topically 2 (two) times daily. For rosacea, Disp: 45 g, Rfl: 1   nystatin ointment (MYCOSTATIN), APPLY TOPICALLY AS DIRECTED, Disp: 30 g, Rfl: 0   RESTASIS 0.05 % ophthalmic emulsion, Place 1 drop into both eyes every morning. , Disp: , Rfl:    rivaroxaban (XARELTO) 20 MG TABS tablet, TAKE 1 TABLET BY MOUTH EVERY DAY WITH SUPPER, Disp: 30 tablet, Rfl: 5   vitamin B-12 (CYANOCOBALAMIN) 500 MCG tablet, Take 1,000 mcg by mouth daily., Disp: , Rfl:    Zinc Oxide 30.6 % CREA, Apply 1 application topically every morning., Disp: , Rfl:    metFORMIN (GLUCOPHAGE-XR) 500 MG 24 hr tablet, Take 1 tablet (500 mg total) by mouth daily with breakfast., Disp: 90 tablet, Rfl: 3  EXAM: BP Readings from Last 3 Encounters:  02/17/21 134/72  11/18/20 126/70  05/28/20 110/66    VITALS per patient if applicable:  GENERAL: alert, oriented, appears well and in no acute distress  HEENT: atraumatic, conjunttiva clear, no obvious abnormalities on inspection of external nose and ears  NECK: normal movements of the head and neck  LUNGS: on inspection no signs of respiratory distress, breathing rate appears normal, no obvious gross SOB, gasping or wheezing  CV: no obvious cyanosis  PSYCH/NEURO: pleasant and cooperative,  speech and thought processing grossly intact Lab Results  Component Value Date   WBC 6.6 04/15/2021   HGB 14.2 04/15/2021   HCT 42.1 04/15/2021   PLT 252  04/15/2021   GLUCOSE 83 04/15/2021   CHOL 196 03/17/2020   TRIG 55.0 03/17/2020   HDL 49.40 03/17/2020   LDLDIRECT 160.7 12/10/2010   LDLCALC 135 (H) 03/17/2020   ALT 17 03/17/2020   AST 16 03/17/2020   NA 141 04/15/2021   K 4.6 04/15/2021   CL 103 04/15/2021   CREATININE 0.74 04/15/2021   BUN 23 04/15/2021   CO2 22 04/15/2021  TSH 2.18 03/17/2020   HGBA1C 6.1 10/22/2020   Lab Results  Component Value Date   VITAMINB12 >1526 (H) 10/22/2020     ASSESSMENT AND PLAN:  Discussed the following assessment and plan:    ICD-10-CM   1. Essential hypertension  I10 CBC with Differential/Platelet    Hemoglobin A1c    Hepatic function panel    Basic metabolic panel    Lipid panel    TSH    2. Medication management  Z79.899 CBC with Differential/Platelet    Hemoglobin A1c    Hepatic function panel    Basic metabolic panel    Lipid panel    TSH    3. Anticoagulant long-term use  Z79.01 CBC with Differential/Platelet    Hemoglobin A1c    Hepatic function panel    Basic metabolic panel    Lipid panel    TSH    4. Fasting hyperglycemia  R73.01 CBC with Differential/Platelet    Hemoglobin A1c    Hepatic function panel    Basic metabolic panel    Lipid panel    TSH    5. Hyperlipidemia, unspecified hyperlipidemia type  E78.5 CBC with Differential/Platelet    Hemoglobin A1c    Hepatic function panel    Basic metabolic panel    Lipid panel    TSH    6. Stress  F43.9     7. Chronic pain of both shoulders  M25.511    G89.29    M25.512     8. Personal history of COVID-19  Z86.16     9. Chronic anticoagulation  Z79.01    for hx of AFlutter      Counseled.  Consider discussing with GYN possible symptoms of perimenopause Refill metformin losartan when due continue. Get fasting labs at the [facility] lab for convenience continue with her specialty care. If bleeding recurs sinuses plan follow-up but episode may be related to irritation in the anticoagulation.  Does not  sound serious at this time. 35 minutes  Discussed getting flu vaccine as flu is in the community she is somewhat concerned because there are "knots in her deltoid" as per physical therapy and does not want her shoulders to hurt more she will think about it.  Expectant management and discussion of plan and treatment with opportunity to ask questions and all were answered. The patient agreed with the plan and demonstrated an understanding of the instructions.   Advised to call back or seek an in-person evaluation if worsening  or having  further concerns  in interim. Return if symptoms worsen or fail to improve, for Fasting labs then plan follow-up depending.Shanon Ace, MD

## 2021-10-20 ENCOUNTER — Other Ambulatory Visit (INDEPENDENT_AMBULATORY_CARE_PROVIDER_SITE_OTHER): Payer: BC Managed Care – PPO

## 2021-10-20 DIAGNOSIS — E785 Hyperlipidemia, unspecified: Secondary | ICD-10-CM | POA: Diagnosis not present

## 2021-10-20 DIAGNOSIS — R7301 Impaired fasting glucose: Secondary | ICD-10-CM

## 2021-10-20 DIAGNOSIS — Z7901 Long term (current) use of anticoagulants: Secondary | ICD-10-CM

## 2021-10-20 DIAGNOSIS — Z79899 Other long term (current) drug therapy: Secondary | ICD-10-CM | POA: Diagnosis not present

## 2021-10-20 DIAGNOSIS — I10 Essential (primary) hypertension: Secondary | ICD-10-CM

## 2021-10-20 LAB — LIPID PANEL
Cholesterol: 191 mg/dL (ref 0–200)
HDL: 48.6 mg/dL (ref >39.00)
LDL Cholesterol: 129 mg/dL — ABNORMAL HIGH (ref 0–99)
NonHDL: 142.23
Total CHOL/HDL Ratio: 4
Triglycerides: 64 mg/dL (ref 0.0–149.0)
VLDL: 12.8 mg/dL (ref 0.0–40.0)

## 2021-10-20 LAB — BASIC METABOLIC PANEL
BUN: 18 mg/dL (ref 6–23)
CO2: 25 mEq/L (ref 19–32)
Calcium: 9 mg/dL (ref 8.4–10.5)
Chloride: 105 mEq/L (ref 96–112)
Creatinine, Ser: 0.74 mg/dL (ref 0.40–1.20)
GFR: 92.55 mL/min (ref >60.00)
Glucose, Bld: 106 mg/dL — ABNORMAL HIGH (ref 70–99)
Potassium: 4.2 mEq/L (ref 3.5–5.1)
Sodium: 138 mEq/L (ref 135–145)

## 2021-10-20 LAB — CBC WITH DIFFERENTIAL/PLATELET
Basophils Absolute: 0 10*3/uL (ref 0.0–0.1)
Basophils Relative: 0.7 % (ref 0.0–3.0)
Eosinophils Absolute: 0.2 10*3/uL (ref 0.0–0.7)
Eosinophils Relative: 3.6 % (ref 0.0–5.0)
HCT: 39.6 % (ref 36.0–46.0)
Hemoglobin: 13.4 g/dL (ref 12.0–15.0)
Lymphocytes Relative: 25 % (ref 12.0–46.0)
Lymphs Abs: 1.3 10*3/uL (ref 0.7–4.0)
MCHC: 33.9 g/dL (ref 30.0–36.0)
MCV: 93.8 fl (ref 78.0–100.0)
Monocytes Absolute: 0.5 10*3/uL (ref 0.1–1.0)
Monocytes Relative: 10.1 % (ref 3.0–12.0)
Neutro Abs: 3.2 10*3/uL (ref 1.4–7.7)
Neutrophils Relative %: 60.6 % (ref 43.0–77.0)
Platelets: 207 10*3/uL (ref 150.0–400.0)
RBC: 4.22 Mil/uL (ref 3.87–5.11)
RDW: 12.8 % (ref 11.5–15.5)
WBC: 5.3 10*3/uL (ref 4.0–10.5)

## 2021-10-20 LAB — HEPATIC FUNCTION PANEL
ALT: 14 U/L (ref 0–35)
AST: 15 U/L (ref 0–37)
Albumin: 4.2 g/dL (ref 3.5–5.2)
Alkaline Phosphatase: 65 U/L (ref 39–117)
Bilirubin, Direct: 0.1 mg/dL (ref 0.0–0.3)
Total Bilirubin: 0.5 mg/dL (ref 0.2–1.2)
Total Protein: 6.8 g/dL (ref 6.0–8.3)

## 2021-10-20 LAB — TSH: TSH: 1.78 u[IU]/mL (ref 0.35–5.50)

## 2021-10-20 LAB — HEMOGLOBIN A1C: Hgb A1c MFr Bld: 6 % (ref 4.6–6.5)

## 2021-10-25 NOTE — Progress Notes (Signed)
Results are normal except cholesterol up slightly and  blood sugar borderline but no diabetes ; thyroid is normal .

## 2021-10-28 ENCOUNTER — Institutional Professional Consult (permissible substitution): Payer: BC Managed Care – PPO | Admitting: Pulmonary Disease

## 2021-11-17 DIAGNOSIS — Z713 Dietary counseling and surveillance: Secondary | ICD-10-CM | POA: Diagnosis not present

## 2021-11-26 ENCOUNTER — Ambulatory Visit: Payer: BC Managed Care – PPO | Admitting: Pulmonary Disease

## 2021-11-26 ENCOUNTER — Encounter: Payer: Self-pay | Admitting: Pulmonary Disease

## 2021-11-26 ENCOUNTER — Other Ambulatory Visit: Payer: Self-pay

## 2021-11-26 VITALS — BP 130/64 | HR 89 | Temp 98.0°F | Ht 60.0 in | Wt 179.8 lb

## 2021-11-26 DIAGNOSIS — G4733 Obstructive sleep apnea (adult) (pediatric): Secondary | ICD-10-CM

## 2021-11-26 DIAGNOSIS — Z9989 Dependence on other enabling machines and devices: Secondary | ICD-10-CM | POA: Diagnosis not present

## 2021-11-26 NOTE — Patient Instructions (Signed)
Your compliance data from your CPAP shows that it is working well  Continue using CPAP on a nightly basis  You should be fine for your surgery From a sleep apnea perspective, you are cleared to go ahead with surgery  I will see you a year from now  Call us with any concerns

## 2021-11-26 NOTE — Progress Notes (Signed)
Gail [name]    [id number]    1966-09-06  Primary Care Physician:Panosh, Standley Brooking, MD  Referring Physician: Burnis Medin, MD [place],   [id number]  Chief complaint:   Obstructive sleep apnea, on CPAP therapy  HPI:  She was last seen here about 2019 Continues to use CPAP on a nightly basis  History of atrial fibrillation  Recently had a fall and injured both shoulders and will require surgery Surgery is being planned for January  Uses CPAP nightly she is on auto CPAP  Goes to bed about 8-8 30, up around 1030 through 11 and then back to sleep at 12 Takes about 10 to 15 minutes to fall asleep 1-2 awakenings Final wake up time about 4 to 4:30 AM  Wakes up feeling like she is at a good nights rest  Gets tired early evening 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   diltiazem (CARDIZEM CD) 360 MG 24 hr capsule TAKE 1 CAPSULE(360 MG) BY MOUTH DAILY   fluticasone (FLONASE) 50 MCG/ACT nasal spray 2 sprays per nostril daily.  MUST HAVE OFFICE VISIT FOR FURTHER REFILLS.   Lactobacillus (ACIDOPHILUS PO) Take 1 capsule by mouth at bedtime.   levonorgestrel (MIRENA, 52 MG,) 20 MCG/24HR IUD Mirena 20 mcg/24 hours (6 yrs) 52 mg intrauterine device  Take 1 device by intrauterine route.   losartan (COZAAR) 50 MG tablet Take 1 tablet (50 mg total) by mouth daily.   metFORMIN (GLUCOPHAGE-XR) 500 MG 24 hr tablet Take 1 tablet (500 mg total) by mouth daily with breakfast.   metroNIDAZOLE (METROGEL) 0.75 % gel Apply 1 application topically 2 (two) times daily. For rosacea   nystatin ointment (MYCOSTATIN)  APPLY TOPICALLY AS DIRECTED   RESTASIS 0.05 % ophthalmic emulsion Place 1 drop into both eyes every morning.    rivaroxaban (XARELTO) 20 MG TABS tablet TAKE 1 TABLET BY MOUTH EVERY DAY WITH SUPPER   vitamin B-12 (CYANOCOBALAMIN) 500 MCG tablet Take 1,000 mcg by mouth daily.   Zinc Oxide 30.6 % CREA Apply 1 application topically every morning.   No facility-administered encounter medications on file as of 11/26/2021.    Allergies as of 11/26/2021 - Review Complete 11/26/2021  Allergen Reaction Noted   Ace inhibitors Cough 09/19/2013   Erythromycin ethylsuccinate  12/30/2006   Doxycycline Rash 11/18/2012   Latex Rash 11/12/2011    Past Medical History:  Diagnosis Date   Allergy    History of shots as a child   Anal fissure    Arthritis    Diabetes mellitus without complication ([place])    History of infertility, female    Hyperlipidemia    Hypertension    IBS (irritable bowel syndrome)    Microscopic hematuria 2017   urology evaluated no sig cause     PCO (polycystic ovaries)    Rosacea     Past Surgical History:  Procedure Laterality Date   A-FLUTTER ABLATION N/A 07/16/2019   Procedure: A-FLUTTER ABLATION;  Surgeon: Evans Lance, MD;  Location: [facility];  Service: Cardiovascular;  Laterality: N/A;   ATRIAL TACH ABLATION N/A 07/16/2019  Procedure: ATRIAL TACH ABLATION;  Surgeon: Evans Lance, MD;  Location: [facility];  Service: Cardiovascular;  Laterality: N/A;   CARPAL TUNNEL RELEASE     x2 bilateral   KNEE ARTHROSCOPY     left    WISDOM TOOTH EXTRACTION     wrist tendon release     bilateral thumbs    Family History  Problem Relation Age of Onset   Hypertension Mother    Heart disease Mother    Hypertension Father    ALS Father        deceased   Dementia Maternal Grandfather    Heart disease Paternal Grandmother    Dementia Paternal Grandfather    Hypertension Other    Lung cancer Other        uncle   Breast cancer Other        great  aunt   Colon cancer Neg Hx    Stomach cancer Neg Hx    Liver cancer Neg Hx    Rectal cancer Neg Hx    Esophageal cancer Neg Hx     Social History   Socioeconomic History   Marital status: Married    Spouse name: Not on file   Number of children: 0   Years of education: Not on file   Highest education level: Not on file  Occupational History   Occupation: preschool Product manager: [employer]  Tobacco Use   Smoking status: Former    Packs/day: 0.50    Years: 7.00    Pack years: 3.50    Types: Cigarettes    Quit date: 12/13/1986    Years since quitting: 34.9   Smokeless tobacco: Never  Vaping Use   Vaping Use: Never used  Substance and Sexual Activity   Alcohol use: No    Alcohol/week: 0.0 standard drinks   Drug use: No   Sexual activity: Not on file    Comment: married  Other Topics Concern   Not on file  Social History Narrative   Married   Pt doesn't get reg exercise   New pet puppy   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   Social Determinants of Health   Financial Resource Strain: Not on file  Food Insecurity: Not on file  Transportation Needs: Not on file  Physical Activity: Not on file  Stress: Not on file  Social Connections: Not on file  Intimate Partner Violence: Not on file    Review of Systems  Constitutional: Negative.   HENT: Negative.    Eyes: Negative.   Respiratory: Negative.    Cardiovascular: Negative.   Genitourinary: Negative.   All other systems reviewed and are negative.  Vitals:   11/26/21 1610  BP: 130/64  Pulse: 89  Temp: 98 F (36.7 C)  SpO2: 99%   Physical Exam Constitutional:      Appearance: She is well-developed. She is obese.  HENT:     Head: Normocephalic and atraumatic.     Nose: Nose normal.  Eyes:     General:        Right eye: No discharge.        Left eye: No discharge.     Conjunctiva/sclera:  Conjunctivae normal.     Pupils: Pupils are equal, round, and reactive to light.  Neck:     Thyroid: No  thyromegaly.  Cardiovascular:     Rate and Rhythm: Normal rate and regular rhythm.  Pulmonary:     Effort: Pulmonary effort is normal. No respiratory distress.     Breath sounds: Normal breath sounds. No stridor. No wheezing or rhonchi.  Abdominal:     General: Bowel sounds are normal. There is no distension.     Palpations: Abdomen is soft.  Musculoskeletal:     Cervical back: Normal range of motion and neck supple.  Neurological:     Mental Status: She is alert.  Psychiatric:        Mood and Affect: Mood normal.   Data Reviewed: Compliance shows 100% compliance Average use of 6 hours 2 minutes On auto titrating CPAP 5-15 AHI of 0.8   Assessment:  Obstructive sleep apnea is optimally treated at the present time -No significant difficulty tolerating CPAP  Encouraged regular use of CPAP  She does buy her own supplies as needed  Other comorbidities are well controlled at present -A. fib is controlled -Blood pressure is controlled  Plan/Recommendations:  I will see you on a yearly basis  She is cleared from a respiratory perspective to go ahead with shoulder surgery  tentative follow-up a year from now    Sherrilyn Rist MD [facility] 11/26/2021, 4:23 PM  CC: Panosh, Standley Brooking, MD

## 2021-12-03 ENCOUNTER — Other Ambulatory Visit: Payer: Self-pay | Admitting: Internal Medicine

## 2021-12-16 ENCOUNTER — Encounter: Payer: Self-pay | Admitting: Internal Medicine

## 2021-12-16 ENCOUNTER — Other Ambulatory Visit: Payer: Self-pay | Admitting: Internal Medicine

## 2021-12-21 DIAGNOSIS — M19012 Primary osteoarthritis, left shoulder: Secondary | ICD-10-CM | POA: Diagnosis not present

## 2021-12-21 DIAGNOSIS — G8918 Other acute postprocedural pain: Secondary | ICD-10-CM | POA: Diagnosis not present

## 2021-12-21 DIAGNOSIS — S46012A Strain of muscle(s) and tendon(s) of the rotator cuff of left shoulder, initial encounter: Secondary | ICD-10-CM | POA: Diagnosis not present

## 2021-12-21 DIAGNOSIS — M7542 Impingement syndrome of left shoulder: Secondary | ICD-10-CM | POA: Diagnosis not present

## 2021-12-21 DIAGNOSIS — M7522 Bicipital tendinitis, left shoulder: Secondary | ICD-10-CM | POA: Diagnosis not present

## 2021-12-21 DIAGNOSIS — Y999 Unspecified external cause status: Secondary | ICD-10-CM | POA: Diagnosis not present

## 2021-12-21 DIAGNOSIS — S43432A Superior glenoid labrum lesion of left shoulder, initial encounter: Secondary | ICD-10-CM | POA: Diagnosis not present

## 2021-12-21 DIAGNOSIS — M67814 Other specified disorders of tendon, left shoulder: Secondary | ICD-10-CM | POA: Diagnosis not present

## 2021-12-21 DIAGNOSIS — X58XXXA Exposure to other specified factors, initial encounter: Secondary | ICD-10-CM | POA: Diagnosis not present

## 2021-12-21 HISTORY — PX: SHOULDER ARTHROSCOPY WITH ROTATOR CUFF REPAIR AND OPEN BICEPS TENODESIS: SHX6677

## 2021-12-23 DIAGNOSIS — Z4889 Encounter for other specified surgical aftercare: Secondary | ICD-10-CM | POA: Diagnosis not present

## 2021-12-25 ENCOUNTER — Telehealth: Payer: Self-pay | Admitting: Internal Medicine

## 2021-12-25 NOTE — Telephone Encounter (Signed)
Patient stated that she had surgery Monday on her shoulder. The provider prescribed the patient pain medicine and she haven't been able to use restroom since Monday.The patient have taken 3 doses of mirwax and wants to know if she should take more.  Patient would like to be contacted at [phone].  Please advise.

## 2021-12-25 NOTE — Telephone Encounter (Signed)
Pt states she has been taking oxycodone on Monday after surgery. Pt states the last time she took it was yesterday am. Up to that point she had been taking it q6hrs. Last BM Monday morning. Reg diet. Denies abdm, vomiting. Pt advised to back of Oxy & only take for break through pain; Take Tylenol as long as not taking Oxy, take Colace BID, drink plenty of fluids. Pt advised that if she begins to have abdm pain or vomiting she needs to go to ED; if she has not had BM by Monday to call & schedule appt with provider. Pt verb understanding.

## 2021-12-29 DIAGNOSIS — Z4889 Encounter for other specified surgical aftercare: Secondary | ICD-10-CM | POA: Diagnosis not present

## 2021-12-31 DIAGNOSIS — Z4889 Encounter for other specified surgical aftercare: Secondary | ICD-10-CM | POA: Diagnosis not present

## 2022-01-01 DIAGNOSIS — Z713 Dietary counseling and surveillance: Secondary | ICD-10-CM | POA: Diagnosis not present

## 2022-01-04 ENCOUNTER — Encounter: Payer: Self-pay | Admitting: Internal Medicine

## 2022-01-04 ENCOUNTER — Telehealth: Payer: BC Managed Care – PPO | Admitting: Internal Medicine

## 2022-01-05 DIAGNOSIS — M75102 Unspecified rotator cuff tear or rupture of left shoulder, not specified as traumatic: Secondary | ICD-10-CM | POA: Diagnosis not present

## 2022-01-05 DIAGNOSIS — Z4889 Encounter for other specified surgical aftercare: Secondary | ICD-10-CM | POA: Diagnosis not present

## 2022-01-05 NOTE — Telephone Encounter (Signed)
Sorry you are having what sounds like a bad cold on top of your postop state. It should be okay to take Robitussin or Mucinex with DM I just would not take 1 with a decongestant. Can use afrin type nasal spray for 2 days if needed for stuffiness

## 2022-01-07 DIAGNOSIS — M75102 Unspecified rotator cuff tear or rupture of left shoulder, not specified as traumatic: Secondary | ICD-10-CM | POA: Diagnosis not present

## 2022-01-07 DIAGNOSIS — Z4889 Encounter for other specified surgical aftercare: Secondary | ICD-10-CM | POA: Diagnosis not present

## 2022-01-11 ENCOUNTER — Other Ambulatory Visit: Payer: Self-pay | Admitting: *Deleted

## 2022-01-11 ENCOUNTER — Telehealth: Payer: Self-pay | Admitting: *Deleted

## 2022-01-11 MED ORDER — RIVAROXABAN 20 MG PO TABS: 20.0000 mg | ORAL_TABLET | Freq: Every day | ORAL | 0 refills | Status: DC

## 2022-01-11 MED ORDER — DILTIAZEM HCL ER COATED BEADS 360 MG PO CP24: ORAL_CAPSULE | ORAL | 0 refills | Status: DC

## 2022-01-11 NOTE — Telephone Encounter (Signed)
Called and spoke to pt and made her aware that she was overdue to see the Dr. Irene Limbo pt to the main line to make an appointment.

## 2022-01-11 NOTE — Telephone Encounter (Signed)
Prescription refill request for Xarelto received.  Indication: aflutter Last office visit:11/18/2020, taylor Weight: 81.6 kg  Age: 56 yo  Scr: 0.74, 10/20/2021 CrCl: 170ml/min   Xarelto 20mg  daily. Overdue for office visit

## 2022-01-12 DIAGNOSIS — M75102 Unspecified rotator cuff tear or rupture of left shoulder, not specified as traumatic: Secondary | ICD-10-CM | POA: Diagnosis not present

## 2022-01-12 DIAGNOSIS — Z4889 Encounter for other specified surgical aftercare: Secondary | ICD-10-CM | POA: Diagnosis not present

## 2022-01-14 DIAGNOSIS — M75102 Unspecified rotator cuff tear or rupture of left shoulder, not specified as traumatic: Secondary | ICD-10-CM | POA: Diagnosis not present

## 2022-01-14 DIAGNOSIS — Z4889 Encounter for other specified surgical aftercare: Secondary | ICD-10-CM | POA: Diagnosis not present

## 2022-01-19 DIAGNOSIS — Z4889 Encounter for other specified surgical aftercare: Secondary | ICD-10-CM | POA: Diagnosis not present

## 2022-01-19 DIAGNOSIS — M75102 Unspecified rotator cuff tear or rupture of left shoulder, not specified as traumatic: Secondary | ICD-10-CM | POA: Diagnosis not present

## 2022-01-21 DIAGNOSIS — M75102 Unspecified rotator cuff tear or rupture of left shoulder, not specified as traumatic: Secondary | ICD-10-CM | POA: Diagnosis not present

## 2022-01-21 DIAGNOSIS — Z4889 Encounter for other specified surgical aftercare: Secondary | ICD-10-CM | POA: Diagnosis not present

## 2022-01-26 DIAGNOSIS — Z4889 Encounter for other specified surgical aftercare: Secondary | ICD-10-CM | POA: Diagnosis not present

## 2022-01-26 DIAGNOSIS — M75102 Unspecified rotator cuff tear or rupture of left shoulder, not specified as traumatic: Secondary | ICD-10-CM | POA: Diagnosis not present

## 2022-01-28 DIAGNOSIS — Z713 Dietary counseling and surveillance: Secondary | ICD-10-CM | POA: Diagnosis not present

## 2022-01-28 DIAGNOSIS — Z4889 Encounter for other specified surgical aftercare: Secondary | ICD-10-CM | POA: Diagnosis not present

## 2022-01-28 DIAGNOSIS — M75102 Unspecified rotator cuff tear or rupture of left shoulder, not specified as traumatic: Secondary | ICD-10-CM | POA: Diagnosis not present

## 2022-02-02 DIAGNOSIS — M75102 Unspecified rotator cuff tear or rupture of left shoulder, not specified as traumatic: Secondary | ICD-10-CM | POA: Diagnosis not present

## 2022-02-02 DIAGNOSIS — Z4889 Encounter for other specified surgical aftercare: Secondary | ICD-10-CM | POA: Diagnosis not present

## 2022-02-04 DIAGNOSIS — M75102 Unspecified rotator cuff tear or rupture of left shoulder, not specified as traumatic: Secondary | ICD-10-CM | POA: Diagnosis not present

## 2022-02-04 DIAGNOSIS — Z4889 Encounter for other specified surgical aftercare: Secondary | ICD-10-CM | POA: Diagnosis not present

## 2022-02-07 ENCOUNTER — Encounter: Payer: Self-pay | Admitting: Internal Medicine

## 2022-02-09 DIAGNOSIS — M75102 Unspecified rotator cuff tear or rupture of left shoulder, not specified as traumatic: Secondary | ICD-10-CM | POA: Diagnosis not present

## 2022-02-09 DIAGNOSIS — Z4889 Encounter for other specified surgical aftercare: Secondary | ICD-10-CM | POA: Diagnosis not present

## 2022-02-11 ENCOUNTER — Ambulatory Visit (INDEPENDENT_AMBULATORY_CARE_PROVIDER_SITE_OTHER): Payer: BC Managed Care – PPO | Admitting: Student

## 2022-02-11 ENCOUNTER — Other Ambulatory Visit: Payer: Self-pay

## 2022-02-11 ENCOUNTER — Encounter: Payer: Self-pay | Admitting: Student

## 2022-02-11 VITALS — BP 130/70 | HR 93 | Ht 60.0 in | Wt 177.4 lb

## 2022-02-11 DIAGNOSIS — I4892 Unspecified atrial flutter: Secondary | ICD-10-CM | POA: Diagnosis not present

## 2022-02-11 DIAGNOSIS — I480 Paroxysmal atrial fibrillation: Secondary | ICD-10-CM | POA: Diagnosis not present

## 2022-02-11 DIAGNOSIS — Z4889 Encounter for other specified surgical aftercare: Secondary | ICD-10-CM | POA: Diagnosis not present

## 2022-02-11 DIAGNOSIS — M75102 Unspecified rotator cuff tear or rupture of left shoulder, not specified as traumatic: Secondary | ICD-10-CM | POA: Diagnosis not present

## 2022-02-11 DIAGNOSIS — I10 Essential (primary) hypertension: Secondary | ICD-10-CM

## 2022-02-11 MED ORDER — RIVAROXABAN 20 MG PO TABS: 20.0000 mg | ORAL_TABLET | Freq: Every day | ORAL | 6 refills | Status: DC

## 2022-02-11 MED ORDER — DILTIAZEM HCL ER COATED BEADS 360 MG PO CP24: ORAL_CAPSULE | ORAL | 3 refills | Status: DC

## 2022-02-11 MED ORDER — LOSARTAN POTASSIUM 50 MG PO TABS: ORAL_TABLET | ORAL | 3 refills | Status: DC

## 2022-02-11 NOTE — Progress Notes (Signed)
? ?PCP:  Panosh, Standley Brooking, MD ?Primary Cardiologist: [name], MD ?Electrophysiologist: [name], MD  ? ?Gail [name] is a 56 y.o. female seen today for [name], MD for routine electrophysiology followup.  Since last being seen in our clinic the patient reports doing very well from a cardiac perspective. She is getting over rotator cuff surgery.  she denies chest pain, palpitations, dyspnea, PND, orthopnea, nausea, vomiting, dizziness, syncope, edema, weight gain, or early satiety. ? ?Past Medical History:  ?Diagnosis Date  ? Allergy   ? History of shots as a child  ? Anal fissure   ? Arthritis   ? Diabetes mellitus without complication ([place])   ? History of infertility, female   ? Hyperlipidemia   ? Hypertension   ? IBS (irritable bowel syndrome)   ? Microscopic hematuria 2017  ? urology evaluated no sig cause    ? PCO (polycystic ovaries)   ? Rosacea   ? ?Past Surgical History:  ?Procedure Laterality Date  ? A-FLUTTER ABLATION N/A 07/16/2019  ? Procedure: A-FLUTTER ABLATION;  Surgeon: Evans Lance, MD;  Location: [facility];  Service: Cardiovascular;  Laterality: N/A;  ? ATRIAL TACH ABLATION N/A 07/16/2019  ? Procedure: ATRIAL TACH ABLATION;  Surgeon: Evans Lance, MD;  Location: [facility];  Service: Cardiovascular;  Laterality: N/A;  ? CARPAL TUNNEL RELEASE    ? x2 bilateral  ? KNEE ARTHROSCOPY    ? left   ? WISDOM TOOTH EXTRACTION    ? wrist tendon release    ? bilateral thumbs  ? ? ?Current Outpatient Medications  ?Medication Sig Dispense Refill  ? amoxicillin-clavulanate (AUGMENTIN) 875-125 MG tablet Take 1 tablet by mouth 2 (two) times daily. 20 tablet 0  ? diltiazem (CARDIZEM CD) 360 MG 24 hr capsule TAKE 1 CAPSULE(360 MG) BY MOUTH DAILY 30 capsule 0  ? fluticasone (FLONASE) 50 MCG/ACT nasal spray 2 sprays per nostril daily.  MUST HAVE OFFICE VISIT FOR FURTHER REFILLS. 16 g 0  ? Lactobacillus (ACIDOPHILUS PO) Take 1 capsule by mouth at bedtime.    ? levonorgestrel (MIRENA,  52 MG,) 20 MCG/24HR IUD Mirena 20 mcg/24 hours (6 yrs) 52 mg intrauterine device ? Take 1 device by intrauterine route.    ? losartan (COZAAR) 50 MG tablet TAKE 1 TABLET(50 MG) BY MOUTH DAILY 90 tablet 0  ? metFORMIN (GLUCOPHAGE-XR) 500 MG 24 hr tablet Take 1 tablet (500 mg total) by mouth daily with breakfast. 90 tablet 3  ? metroNIDAZOLE (METROGEL) 0.75 % gel Apply 1 application topically 2 (two) times daily. For rosacea 45 g 1  ? nystatin ointment (MYCOSTATIN) APPLY TOPICALLY AS DIRECTED 30 g 0  ? RESTASIS 0.05 % ophthalmic emulsion Place 1 drop into both eyes every morning.     ? rivaroxaban (XARELTO) 20 MG TABS tablet Take 1 tablet (20 mg total) by mouth daily with supper. 30 tablet 0  ? vitamin B-12 (CYANOCOBALAMIN) 500 MCG tablet Take 1,000 mcg by mouth daily.    ? Zinc Oxide 30.6 % CREA Apply 1 application topically every morning.    ? ?No current facility-administered medications for this visit.  ? ? ?Allergies  ?Allergen Reactions  ? Ace Inhibitors Cough  ? Erythromycin Ethylsuccinate   ?  Stomach hurts ?REACTION: unspecified   can take azithromycin  ? Doxycycline Rash  ? Latex Rash  ? ? ?Social History  ? ?Socioeconomic History  ? Marital status: Married  ?  Spouse name: Not on file  ?  Number of children: 0  ? Years of education: Not on file  ? Highest education level: Not on file  ?Occupational History  ? Occupation: Print production planner  ?  Employer: [employer]  ?Tobacco Use  ? Smoking status: Former  ?  Packs/day: 0.50  ?  Years: 7.00  ?  Pack years: 3.50  ?  Types: Cigarettes  ?  Quit date: 12/13/1986  ?  Years since quitting: 35.1  ? Smokeless tobacco: Never  ?Vaping Use  ? Vaping Use: Never used  ?Substance and Sexual Activity  ? Alcohol use: No  ?  Alcohol/week: 0.0 standard drinks  ? Drug use: No  ? Sexual activity: Not on file  ?  Comment: married  ?Other Topics Concern  ? Not on file  ?Social History Narrative  ? Married  ? Pt doesn't get reg exercise  ? New pet puppy  ? 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 Ex smoker age 41  ? Going to school  ? [employer].  ? ?Social Determinants of Health  ? ?Financial Resource Strain: Not on file  ?Food Insecurity: Not on file  ?Transportation Needs: Not on file  ?Physical Activity: Not on file  ?Stress: Not on file  ?Social Connections: Not on file  ?Intimate Partner Violence: Not on file  ? ? ? ?Review of Systems: ?All other systems reviewed and are otherwise negative except as noted above. ? ?Physical Exam: ?There were no vitals filed for this visit. ? ?GEN- The patient is well appearing, alert and oriented x 3 today.   ?HEENT: normocephalic, atraumatic; sclera clear, conjunctiva pink; hearing intact; oropharynx clear; neck supple, no JVP ?Lymph- no cervical lymphadenopathy ?Lungs- Clear to ausculation bilaterally, normal work of breathing.  No wheezes, rales, rhonchi ?Heart- Regular rate and rhythm, no murmurs, rubs or gallops, PMI not laterally displaced ?GI- soft, non-tender, non-distended, bowel sounds present, no hepatosplenomegaly ?Extremities- no clubbing, cyanosis, or edema; DP/PT/radial pulses 2+ bilaterally ?MS- no significant deformity or atrophy ?Skin- warm and dry, no rash or lesion ?Psych- flat but appropriate affect affect, pressured speech ?Neuro- strength and sensation are intact ? ?EKG is ordered. Personal review of EKG from today shows NSR at 93 bpm ? ?Additional studies reviewed include: ?Previous EP office notes.  ? ?Assessment and Plan: ? ?1. Atrial flutter s/p ablation ?Asymptomatic s/p ablation ? ?2. Paroxysmal atrial fibrillation ?Minimal symptoms. 10 seconds was longest episode on previous monitor ?Continue Xarelto ?Continue diltiazem ?Labs stable 11/22. Has upcoming PCP visit ? ?3. HTN ?Stable on current regimen  ? ?Follow up with Dr. Lovena Le in 12 months  ? ?Shirley Friar, PA-C  ?02/11/22 ?12:21 PM  ?

## 2022-02-11 NOTE — Patient Instructions (Signed)
Medication Instructions:  Your physician recommends that you continue on your current medications as directed. Please refer to the Current Medication list given to you today.  *If you need a refill on your cardiac medications before your next appointment, please call your pharmacy*   Lab Work: None If you have labs (blood work) drawn today and your tests are completely normal, you will receive your results only by: MyChart Message (if you have MyChart) OR A paper copy in the mail If you have any lab test that is abnormal or we need to change your treatment, we will call you to review the results.   Follow-Up: At [facility], you and your health needs are our priority.  As part of our continuing mission to provide you with exceptional heart care, we have created designated Provider Care Teams.  These Care Teams include your primary Cardiologist (physician) and Advanced Practice Providers (APPs -  Physician Assistants and Nurse Practitioners) who all work together to provide you with the care you need, when you need it.  Your next appointment:   1 year(s)  The format for your next appointment:   In Person  Provider:   Gregg Taylor, MD   

## 2022-02-16 DIAGNOSIS — Z4889 Encounter for other specified surgical aftercare: Secondary | ICD-10-CM | POA: Diagnosis not present

## 2022-02-16 DIAGNOSIS — M75102 Unspecified rotator cuff tear or rupture of left shoulder, not specified as traumatic: Secondary | ICD-10-CM | POA: Diagnosis not present

## 2022-02-18 DIAGNOSIS — Z4889 Encounter for other specified surgical aftercare: Secondary | ICD-10-CM | POA: Diagnosis not present

## 2022-02-18 DIAGNOSIS — M75102 Unspecified rotator cuff tear or rupture of left shoulder, not specified as traumatic: Secondary | ICD-10-CM | POA: Diagnosis not present

## 2022-02-23 DIAGNOSIS — Z4889 Encounter for other specified surgical aftercare: Secondary | ICD-10-CM | POA: Diagnosis not present

## 2022-02-23 DIAGNOSIS — M25512 Pain in left shoulder: Secondary | ICD-10-CM | POA: Diagnosis not present

## 2022-02-23 DIAGNOSIS — M75102 Unspecified rotator cuff tear or rupture of left shoulder, not specified as traumatic: Secondary | ICD-10-CM | POA: Diagnosis not present

## 2022-02-23 DIAGNOSIS — Z9889 Other specified postprocedural states: Secondary | ICD-10-CM | POA: Diagnosis not present

## 2022-02-24 ENCOUNTER — Encounter: Payer: Self-pay | Admitting: Internal Medicine

## 2022-02-24 DIAGNOSIS — Z713 Dietary counseling and surveillance: Secondary | ICD-10-CM | POA: Diagnosis not present

## 2022-02-25 ENCOUNTER — Telehealth: Payer: Self-pay

## 2022-02-25 DIAGNOSIS — M75102 Unspecified rotator cuff tear or rupture of left shoulder, not specified as traumatic: Secondary | ICD-10-CM | POA: Diagnosis not present

## 2022-02-25 DIAGNOSIS — Z4889 Encounter for other specified surgical aftercare: Secondary | ICD-10-CM | POA: Diagnosis not present

## 2022-02-25 NOTE — Telephone Encounter (Signed)
Patient called and was given message

## 2022-02-25 NOTE — Telephone Encounter (Signed)
Hi Gail [name]  ? magnesium should be okay as long as its not excess amount.  But you can always ask the pharmacist. ? ? ?You might want to stay on the MiraLAX to keep things going I do not think this is harmful through not having diarrhea. ?If needed you can schedule a visit and discuss further. ? ?

## 2022-02-26 NOTE — Telephone Encounter (Signed)
Noted  

## 2022-03-02 ENCOUNTER — Telehealth: Payer: Self-pay

## 2022-03-02 DIAGNOSIS — M75102 Unspecified rotator cuff tear or rupture of left shoulder, not specified as traumatic: Secondary | ICD-10-CM | POA: Diagnosis not present

## 2022-03-02 DIAGNOSIS — Z4889 Encounter for other specified surgical aftercare: Secondary | ICD-10-CM | POA: Diagnosis not present

## 2022-03-02 NOTE — Telephone Encounter (Signed)
Pt called back and stated that she is feeling much better. She thinks that episode come from having a cortisone injection. She will call back if anything changes.  ?

## 2022-03-02 NOTE — Telephone Encounter (Signed)
Patient returned call. Transferred to April, [place]. ?

## 2022-03-02 NOTE — Telephone Encounter (Signed)
LM for pt to call back to get an update on her symptoms.  ?

## 2022-03-04 ENCOUNTER — Telehealth (INDEPENDENT_AMBULATORY_CARE_PROVIDER_SITE_OTHER): Payer: BC Managed Care – PPO | Admitting: Internal Medicine

## 2022-03-04 ENCOUNTER — Encounter: Payer: Self-pay | Admitting: Internal Medicine

## 2022-03-04 ENCOUNTER — Other Ambulatory Visit: Payer: Self-pay

## 2022-03-04 VITALS — HR 87 | Wt 176.0 lb

## 2022-03-04 DIAGNOSIS — Z4889 Encounter for other specified surgical aftercare: Secondary | ICD-10-CM | POA: Diagnosis not present

## 2022-03-04 DIAGNOSIS — Z9889 Other specified postprocedural states: Secondary | ICD-10-CM | POA: Diagnosis not present

## 2022-03-04 DIAGNOSIS — Z79899 Other long term (current) drug therapy: Secondary | ICD-10-CM | POA: Diagnosis not present

## 2022-03-04 DIAGNOSIS — G2581 Restless legs syndrome: Secondary | ICD-10-CM | POA: Diagnosis not present

## 2022-03-04 DIAGNOSIS — M75102 Unspecified rotator cuff tear or rupture of left shoulder, not specified as traumatic: Secondary | ICD-10-CM | POA: Diagnosis not present

## 2022-03-04 NOTE — Progress Notes (Signed)
?Virtual Visit via Video Note ? ?I connected with Gail [name] on 03/04/22 at  9:30 AM EDT by a video enabled telemedicine application and verified that I am speaking with the correct person using two identifiers. ?Location patient: home ?Location provider: home office ?Persons participating in the virtual visit: patient, provider ? ?[place] national recommendations  regarding COVID 19 pandemic   video visit is advised over in office visit for this patient.  ?Patient aware  of the limitations of evaluation and management by telemedicine and  availability of in person appointments. and agreed to proceed. ? ? ?HPI: ?[name] presents for video visit because of aggravation of restless leg most recently since her surgery in January on her shoulder.  She has not slept very well because of pain and has had to sleep in a chair and only occasionally in her own bed. ?Her legs are jumpy and her husband make comments about this becoming more severe. ?Wonders if her iron level is low or medications are aggravating her symptoms that are affecting her sleep also. ? ?She is taking MiraLAX and only taking opiate a couple times a week when she has physical therapy. ? ?No active bleeding. ?She has not tried muscle relaxant at nighttime to avoid extra medicine. ? ?Also had a bite on her hand that she eventually used old triamcinolone is getting better but thinks it might be old.  Asks other advice ? ?ROS: See pertinent positives and negatives per HPI. ? ?Past Medical History:  ?Diagnosis Date  ? Allergy   ? History of shots as a child  ? Anal fissure   ? Arthritis   ? Diabetes mellitus without complication ([place])   ? History of infertility, female   ? Hyperlipidemia   ? Hypertension   ? IBS (irritable bowel syndrome)   ? Microscopic hematuria 2017  ? urology evaluated no sig cause    ? PCO (polycystic ovaries)   ? Rosacea   ? ? ?Past Surgical History:  ?Procedure Laterality Date  ? A-FLUTTER ABLATION N/A 07/16/2019  ? Procedure:  A-FLUTTER ABLATION;  Surgeon: Evans Lance, MD;  Location: [facility];  Service: Cardiovascular;  Laterality: N/A;  ? ATRIAL TACH ABLATION N/A 07/16/2019  ? Procedure: ATRIAL TACH ABLATION;  Surgeon: Evans Lance, MD;  Location: [facility];  Service: Cardiovascular;  Laterality: N/A;  ? CARPAL TUNNEL RELEASE    ? x2 bilateral  ? KNEE ARTHROSCOPY    ? left   ? WISDOM TOOTH EXTRACTION    ? wrist tendon release    ? bilateral thumbs  ? ? ?Family History  ?Problem Relation Age of Onset  ? Hypertension Mother   ? Heart disease Mother   ? Hypertension Father   ? ALS Father   ?     deceased  ? Dementia Maternal Grandfather   ? Heart disease Paternal Grandmother   ? Dementia Paternal Grandfather   ? Hypertension Other   ? Lung cancer Other   ?     uncle  ? Breast cancer Other   ?     great aunt  ? Colon cancer Neg Hx   ? Stomach cancer Neg Hx   ? Liver cancer Neg Hx   ? Rectal cancer Neg Hx   ? Esophageal cancer Neg Hx   ? ? ?Social History  ? ?Tobacco Use  ? Smoking status: Former  ?  Packs/day: 0.50  ?  Years: 7.00  ?  Pack years:  3.50  ?  Types: Cigarettes  ?  Quit date: 12/13/1986  ?  Years since quitting: 35.2  ? Smokeless tobacco: Never  ?Vaping Use  ? Vaping Use: Never used  ?Substance Use Topics  ? Alcohol use: No  ?  Alcohol/week: 0.0 [name] drinks  ? Drug use: No  ? ? ? ? ?Current Outpatient Medications:  ?  diltiazem (CARDIZEM CD) 360 MG 24 hr capsule, TAKE 1 CAPSULE(360 MG) BY MOUTH DAILY, Disp: 90 capsule, Rfl: 3 ?  fluticasone (FLONASE) 50 MCG/ACT nasal spray, 2 sprays per nostril daily.  MUST HAVE OFFICE VISIT FOR FURTHER REFILLS., Disp: 16 g, Rfl: 0 ?  Lactobacillus (ACIDOPHILUS PO), Take 1 capsule by mouth at bedtime., Disp: , Rfl:  ?  levonorgestrel (MIRENA, 52 MG,) 20 MCG/24HR IUD, Mirena 20 mcg/24 hours (6 yrs) 52 mg intrauterine device  Take 1 device by intrauterine route., Disp: , Rfl:  ?  losartan (COZAAR) 50 MG tablet, TAKE 1 TABLET(50 MG) BY MOUTH DAILY, Disp: 90 tablet, Rfl: 3 ?   metFORMIN (GLUCOPHAGE-XR) 500 MG 24 hr tablet, Take 1 tablet (500 mg total) by mouth daily with breakfast., Disp: 90 tablet, Rfl: 3 ?  metroNIDAZOLE (METROGEL) 0.75 % gel, Apply 1 application topically 2 (two) times daily. For rosacea, Disp: 45 g, Rfl: 1 ?  nystatin ointment (MYCOSTATIN), APPLY TOPICALLY AS DIRECTED, Disp: 30 g, Rfl: 0 ?  oxyCODONE (OXY IR/ROXICODONE) 5 MG immediate release tablet, Take 5 mg by mouth every 6 (six) hours as needed., Disp: , Rfl:  ?  RESTASIS 0.05 % ophthalmic emulsion, Place 1 drop into both eyes every morning. , Disp: , Rfl:  ?  rivaroxaban (XARELTO) 20 MG TABS tablet, Take 1 tablet (20 mg total) by mouth daily with supper., Disp: 30 tablet, Rfl: 6 ?  vitamin B-12 (CYANOCOBALAMIN) 500 MCG tablet, Take 1,000 mcg by mouth daily., Disp: , Rfl:  ?  Zinc Oxide 30.6 % CREA, Apply 1 application topically every morning., Disp: , Rfl:  ? ?EXAM: ?BP Readings from Last 3 Encounters:  ?02/11/22 130/70  ?11/26/21 130/64  ?02/17/21 134/72  ? ? ?VITALS per patient if applicable: ? ?GENERAL: alert, oriented, appears well and in no acute distress ? ?HEENT: atraumatic, conjunttiva clear, no obvious abnormalities on inspection of external nose and ears ? ?NECK: normal movements of the head and neck ? ?LUNGS: on inspection no signs of respiratory distress, breathing rate appears normal, no obvious gross SOB, gasping or wheezing ? ?CV: no obvious cyanosis ? ?PSYCH/NEURO: pleasant and cooperative, no obvious depression or anxiety, speech and thought processing grossly intact ?Lab Results  ?Component Value Date  ? WBC 5.3 10/20/2021  ? HGB 13.4 10/20/2021  ? HCT 39.6 10/20/2021  ? PLT 207.0 10/20/2021  ? GLUCOSE 106 (H) 10/20/2021  ? CHOL 191 10/20/2021  ? TRIG 64.0 10/20/2021  ? HDL 48.60 10/20/2021  ? LDLDIRECT 160.7 12/10/2010  ? LDLCALC 129 (H) 10/20/2021  ? ALT 14 10/20/2021  ? AST 15 10/20/2021  ? NA 138 10/20/2021  ? K 4.2 10/20/2021  ? CL 105 10/20/2021  ? CREATININE 0.74 10/20/2021  ? BUN 18  10/20/2021  ? CO2 25 10/20/2021  ? TSH 1.78 10/20/2021  ? HGBA1C 6.0 10/20/2021  ? ? ?ASSESSMENT AND PLAN: ? ?Discussed the following assessment and plan: ? ?  ICD-10-CM   ?1. RLS (restless legs syndrome)  G25.81 CBC with Differential/Platelet  ?  Basic metabolic panel  ?  IBC Panel([facility])  ?  Iron, TIBC and Ferritin Panel  ?  ?  2. Medication management  Z79.899 CBC with Differential/Platelet  ?  Basic metabolic panel  ?  IBC Panel([facility])  ?  Iron, TIBC and Ferritin Panel  ?  ?3. Post-operative state  Z98.890 CBC with Differential/Platelet  ?  Basic metabolic panel  ?  IBC Panel([facility])  ?  Iron, TIBC and Ferritin Panel  ?  ? ?Most likely aggravated restless leg postoperatively ?Check CBC and iron levels begin iron supplementation even every other day with her stool softener. ?Try taking the tizanidine at 19 she already has the muscle relaxant ?Do not think she is on enough narcotics that would aggravate it ?Calcium channel blocker can be taken at any time do not need to change that ?Consider adding medicine such as gabapentin or pregabalin if needed in the interim. ?She is continued on the prolonged rehab which is been difficult trying to avoid a frozen shoulder postop. ?We will try over-the-counter hydrocortisone first and moisturizer send in a picture of the area of concern if ongoing. ?Counseled.  ? Expectant management and discussion of plan and treatment with opportunity to ask questions and all were answered. The patient agreed with the plan and demonstrated an understanding of the instructions. ?She has not been back to work because of the type of job she has which has been frustrating hopefully more aggressive physical therapy will be helpful although may be painful. ?Advised to call back or seek an in-person evaluation if worsening  or having  further concerns  in interim. ?Return if symptoms worsen or fail to improve, for depending on results and how doing  for FU . ? ? ? ?Shanon Ace, MD  ?

## 2022-03-09 DIAGNOSIS — Z4889 Encounter for other specified surgical aftercare: Secondary | ICD-10-CM | POA: Diagnosis not present

## 2022-03-09 DIAGNOSIS — M75102 Unspecified rotator cuff tear or rupture of left shoulder, not specified as traumatic: Secondary | ICD-10-CM | POA: Diagnosis not present

## 2022-03-11 DIAGNOSIS — M75102 Unspecified rotator cuff tear or rupture of left shoulder, not specified as traumatic: Secondary | ICD-10-CM | POA: Diagnosis not present

## 2022-03-11 DIAGNOSIS — Z4889 Encounter for other specified surgical aftercare: Secondary | ICD-10-CM | POA: Diagnosis not present

## 2022-03-16 ENCOUNTER — Telehealth: Payer: Self-pay | Admitting: Internal Medicine

## 2022-03-16 ENCOUNTER — Encounter: Payer: Self-pay | Admitting: Family Medicine

## 2022-03-16 ENCOUNTER — Ambulatory Visit (INDEPENDENT_AMBULATORY_CARE_PROVIDER_SITE_OTHER): Payer: BC Managed Care – PPO

## 2022-03-16 ENCOUNTER — Ambulatory Visit (INDEPENDENT_AMBULATORY_CARE_PROVIDER_SITE_OTHER): Payer: BC Managed Care – PPO | Admitting: Family Medicine

## 2022-03-16 ENCOUNTER — Other Ambulatory Visit (INDEPENDENT_AMBULATORY_CARE_PROVIDER_SITE_OTHER): Payer: BC Managed Care – PPO

## 2022-03-16 VITALS — BP 128/70 | HR 98 | Temp 98.4°F | Resp 16 | Ht 60.0 in | Wt 180.4 lb

## 2022-03-16 DIAGNOSIS — R310 Gross hematuria: Secondary | ICD-10-CM

## 2022-03-16 DIAGNOSIS — Z9889 Other specified postprocedural states: Secondary | ICD-10-CM

## 2022-03-16 DIAGNOSIS — G2581 Restless legs syndrome: Secondary | ICD-10-CM

## 2022-03-16 DIAGNOSIS — Z79899 Other long term (current) drug therapy: Secondary | ICD-10-CM | POA: Diagnosis not present

## 2022-03-16 DIAGNOSIS — R1084 Generalized abdominal pain: Secondary | ICD-10-CM | POA: Diagnosis not present

## 2022-03-16 DIAGNOSIS — N200 Calculus of kidney: Secondary | ICD-10-CM | POA: Diagnosis not present

## 2022-03-16 DIAGNOSIS — R319 Hematuria, unspecified: Secondary | ICD-10-CM

## 2022-03-16 LAB — URINALYSIS, ROUTINE W REFLEX MICROSCOPIC
Bilirubin Urine: NEGATIVE
Ketones, ur: NEGATIVE
Leukocytes,Ua: NEGATIVE
Nitrite: NEGATIVE
Specific Gravity, Urine: 1.015 (ref 1.000–1.030)
Total Protein, Urine: 30 — AB
Urine Glucose: NEGATIVE
Urobilinogen, UA: 1 (ref 0.0–1.0)
pH: 7 (ref 5.0–8.0)

## 2022-03-16 LAB — CBC WITH DIFFERENTIAL/PLATELET
Basophils Absolute: 0 10*3/uL (ref 0.0–0.1)
Basophils Relative: 0.9 % (ref 0.0–3.0)
Eosinophils Absolute: 0.1 10*3/uL (ref 0.0–0.7)
Eosinophils Relative: 1.8 % (ref 0.0–5.0)
HCT: 41.8 % (ref 36.0–46.0)
Hemoglobin: 14.1 g/dL (ref 12.0–15.0)
Lymphocytes Relative: 21.4 % (ref 12.0–46.0)
Lymphs Abs: 1.1 10*3/uL (ref 0.7–4.0)
MCHC: 33.7 g/dL (ref 30.0–36.0)
MCV: 94.4 fl (ref 78.0–100.0)
Monocytes Absolute: 0.6 10*3/uL (ref 0.1–1.0)
Monocytes Relative: 10.8 % (ref 3.0–12.0)
Neutro Abs: 3.4 10*3/uL (ref 1.4–7.7)
Neutrophils Relative %: 65.1 % (ref 43.0–77.0)
Platelets: 229 10*3/uL (ref 150.0–400.0)
RBC: 4.42 Mil/uL (ref 3.87–5.11)
RDW: 14.1 % (ref 11.5–15.5)
WBC: 5.2 10*3/uL (ref 4.0–10.5)

## 2022-03-16 LAB — POCT URINALYSIS DIPSTICK
Bilirubin, UA: NEGATIVE
Glucose, UA: NEGATIVE
Ketones, UA: NEGATIVE
Leukocytes, UA: NEGATIVE
Nitrite, UA: NEGATIVE
Protein, UA: POSITIVE — AB
Spec Grav, UA: 1.015 (ref 1.010–1.025)
Urobilinogen, UA: 0.2 E.U./dL
pH, UA: 6.5 (ref 5.0–8.0)

## 2022-03-16 LAB — BASIC METABOLIC PANEL
BUN: 15 mg/dL (ref 6–23)
CO2: 25 mEq/L (ref 19–32)
Calcium: 9.4 mg/dL (ref 8.4–10.5)
Chloride: 101 mEq/L (ref 96–112)
Creatinine, Ser: 0.72 mg/dL (ref 0.40–1.20)
GFR: 95.38 mL/min (ref >60.00)
Glucose, Bld: 95 mg/dL (ref 70–99)
Potassium: 4.2 mEq/L (ref 3.5–5.1)
Sodium: 135 mEq/L (ref 135–145)

## 2022-03-16 LAB — IBC PANEL
Iron: 101 ug/dL (ref 42–145)
Saturation Ratios: 29.7 % (ref 20.0–50.0)
TIBC: 340.2 ug/dL (ref 250.0–450.0)
Transferrin: 243 mg/dL (ref 212.0–360.0)

## 2022-03-16 MED ORDER — NITROFURANTOIN MONOHYD MACRO 100 MG PO CAPS: 100.0000 mg | ORAL_CAPSULE | Freq: Two times a day (BID) | ORAL | 0 refills | Status: AC

## 2022-03-16 NOTE — Patient Instructions (Signed)
A few things to remember from today's visit: ? ?Dark urine - Plan: POCT urinalysis dipstick ? ?Hematuria, unspecified type - Plan: Culture, Urine, Urinalysis with Reflex Microscopic, DG Abd 1 View ? ?Nephrolithiasis - Plan: DG Abd 1 View ? ?Do not use My Chart to request refills or for acute issues that need immediate attention. ?  ?Antibiotic for possible UTI started today. ? ?Please be sure medication list is accurate. ?If a new problem present, please set up appointment sooner than planned today. ? ?Hematuria, Adult ?Hematuria is blood in the urine. Blood may be visible in the urine, or it may be identified with a test. This condition can be caused by infections of the bladder, urethra, kidney, or prostate. Other possible causes include: ?Kidney stones. ?Cancer of the urinary tract. ?Too much calcium in the urine. ?Conditions that are passed from parent to child (inherited conditions). ?Exercise that requires a lot of energy. ?Infections can usually be treated with medicine, and a kidney stone usually will pass through your urine. If neither of these is the cause of your hematuria, more tests may be needed to identify the cause of your symptoms. ?It is very important to tell your health care provider about any blood in your urine, even if it is painless or the blood stops without treatment. Blood in the urine, when it happens and then stops and then happens again, can be a symptom of a very serious condition, including cancer. There is no pain in the initial stages of many urinary cancers. ?Follow these instructions at home: ?Medicines ?Take over-the-counter and prescription medicines only as told by your health care provider. ?If you were prescribed an antibiotic medicine, take it as told by your health care provider. Do not stop taking the antibiotic even if you start to feel better. ?Eating and drinking ?Drink enough fluid to keep your urine pale yellow. It is recommended that you drink 3-4 quarts (2.8-3.8 L)  a day. If you have been diagnosed with an infection, drinking cranberry juice in addition to large amounts of water is recommended. ?Avoid caffeine, tea, and carbonated beverages. These tend to irritate the bladder. ?Avoid alcohol because it may irritate the prostate (in males). ?General instructions ?If you have been diagnosed with a kidney stone, follow your health care provider's instructions about straining your urine to catch the stone. ?Empty your bladder often. Avoid holding urine for long periods of time. ?If you are female: ?After a bowel movement, wipe from front to back and use each piece of toilet paper only once. ?Empty your bladder before and after sex. ?Pay attention to any changes in your symptoms. Tell your health care provider about any changes or any new symptoms. ?It is up to you to get the results of any tests. Ask your health care provider, or the department that is doing the test, when your results will be ready. ?Keep all follow-up visits. This is important. ?Contact a health care provider if: ?You develop back pain. ?You have a fever or chills. ?You have nausea or vomiting. ?Your symptoms do not improve after 3 days. ?Your symptoms get worse. ?Get help right away if: ?You develop severe vomiting and are unable to take medicine without vomiting. ?You develop severe pain in your back or abdomen even though you are taking medicine. ?You pass a large amount of blood in your urine. ?You pass blood clots in your urine. ?You feel very weak or like you might faint. ?You faint. ?Summary ?Hematuria is blood in the urine.  It has many possible causes. ?It is very important that you tell your health care provider about any blood in your urine, even if it is painless or the blood stops without treatment. ?Take over-the-counter and prescription medicines only as told by your health care provider. ?Drink enough fluid to keep your urine pale yellow. ?This information is not intended to replace advice given  to you by your health care provider. Make sure you discuss any questions you have with your health care provider. ?Document Revised: 07/30/2020 Document Reviewed: 07/30/2020 ?Elsevier Patient Education ? 2022 [place]. ? ? ?

## 2022-03-16 NOTE — Telephone Encounter (Signed)
Patient states her urine is as dark as coffee.  No pain while urinating.  She is coming In for labs and wants to do a urine sample while she is here.  Please call patient to let her know.   ?

## 2022-03-16 NOTE — Progress Notes (Signed)
? ?ACUTE VISIT ?Chief Complaint  ?Patient presents with  ? dark urine  ?  Started Sunday afternoon, light cramping & sharp pain before bed; slight back pain. Has iud.   ? ?HPI: ?Gail [name] is a 56 y.o. female with hx of paroxysmal atrial fib on chronic anticoagulation,HLD, HTN, and back pain here today complaining of brown dark urine since yesterday. ?Mild dysuria lat night and some urinary frequency today, the latter one she attributes to increasing fluid intake today. ? ?Hematuria ?This is a new problem. Associated symptoms include abdominal pain and dysuria. Pertinent negatives include no bladder pain, bone pain, chills, facial swelling, fever, flank pain, genital pain, hesitancy, inability to urinate, nausea, urinary retention, vomiting or weight loss. Her sexual activity is non-contributory to the current illness. Her past medical history is significant for hypertension and kidney stones. There is no history of recent infection or tobacco use. Risk factors include anticoagulant.  ?Former smoker. ?FHx negative for renal/bladder malignancy. ? ?Periumbilical intermittent abdominal cramps, worse yesterday, has had some today, mild, not radiated. ?She has not identified exacerbating or alleviating factors. ? ?Sudden stubbing back pain last night, improved after walking for a few minutes. ? ?She has not noted nose/gum bleeding, blood in stool,or melena. ? ?She has had similar episodes before,treated for UTI. ? ?She has seen urologist in the past , referred by her gyn because pos blood in urine seen on UA. According to pt, work up was otherwise negative, including cystoscopic exam. ?Renal CT in 10/2018:  ?1. Mild left hydronephrosis due to a 3 mm proximal ureteral calculus on the left. No other urinary tract calculi observed. ?2. Aortic Atherosclerosis (ICD10-I70.0). Multilevel lumbar impingement. ?3. Sludge versus less likely gallstones in the gallbladder. ? ?Review of Systems  ?Constitutional:  Negative for  chills, fever and weight loss.  ?HENT:  Negative for facial swelling.   ?Respiratory:  Negative for cough, shortness of breath and wheezing.   ?Cardiovascular:  Negative for chest pain, palpitations and leg swelling.  ?Gastrointestinal:  Positive for abdominal pain. Negative for nausea and vomiting.  ?Genitourinary:  Positive for dysuria and hematuria. Negative for flank pain, hesitancy, pelvic pain, vaginal bleeding and vaginal discharge.  ?Hematological:  Negative for adenopathy. Does not bruise/bleed easily.  ?Rest see pertinent positives and negatives per HPI. ? ?Current Outpatient Medications on File Prior to Visit  ?Medication Sig Dispense Refill  ? diltiazem (CARDIZEM CD) 360 MG 24 hr capsule TAKE 1 CAPSULE(360 MG) BY MOUTH DAILY 90 capsule 3  ? fluticasone (FLONASE) 50 MCG/ACT nasal spray 2 sprays per nostril daily.  MUST HAVE OFFICE VISIT FOR FURTHER REFILLS. 16 g 0  ? Lactobacillus (ACIDOPHILUS PO) Take 1 capsule by mouth at bedtime.    ? levonorgestrel (MIRENA, 52 MG,) 20 MCG/24HR IUD Mirena 20 mcg/24 hours (6 yrs) 52 mg intrauterine device ? Take 1 device by intrauterine route.    ? losartan (COZAAR) 50 MG tablet TAKE 1 TABLET(50 MG) BY MOUTH DAILY 90 tablet 3  ? metFORMIN (GLUCOPHAGE-XR) 500 MG 24 hr tablet Take 1 tablet (500 mg total) by mouth daily with breakfast. 90 tablet 3  ? metroNIDAZOLE (METROGEL) 0.75 % gel Apply 1 application topically 2 (two) times daily. For rosacea 45 g 1  ? nystatin ointment (MYCOSTATIN) APPLY TOPICALLY AS DIRECTED 30 g 0  ? oxyCODONE (OXY IR/ROXICODONE) 5 MG immediate release tablet Take 5 mg by mouth every 6 (six) hours as needed.    ? RESTASIS 0.05 % ophthalmic emulsion Place 1 drop into  both eyes every morning.     ? rivaroxaban (XARELTO) 20 MG TABS tablet Take 1 tablet (20 mg total) by mouth daily with supper. 30 tablet 6  ? vitamin B-12 (CYANOCOBALAMIN) 500 MCG tablet Take 1,000 mcg by mouth daily.    ? Zinc Oxide 30.6 % CREA Apply 1 application topically every  morning.    ? ?No current facility-administered medications on file prior to visit.  ? ?Past Medical History:  ?Diagnosis Date  ? Allergy   ? History of shots as a child  ? Anal fissure   ? Arthritis   ? Diabetes mellitus without complication ([place])   ? History of infertility, female   ? Hyperlipidemia   ? Hypertension   ? IBS (irritable bowel syndrome)   ? Microscopic hematuria 2017  ? urology evaluated no sig cause    ? PCO (polycystic ovaries)   ? Rosacea   ? ?Allergies  ?Allergen Reactions  ? Ace Inhibitors Cough  ? Erythromycin Ethylsuccinate   ?  Stomach hurts ?REACTION: unspecified   can take azithromycin  ? Doxycycline Rash  ? Latex Rash  ? ? ?Social History  ? ?Socioeconomic History  ? Marital status: Married  ?  Spouse name: Not on file  ? Number of children: 0  ? Years of education: Not on file  ? Highest education level: Not on file  ?Occupational History  ? Occupation: Print production planner  ?  Employer: [employer]  ?Tobacco Use  ? Smoking status: Former  ?  Packs/day: 0.50  ?  Years: 7.00  ?  Pack years: 3.50  ?  Types: Cigarettes  ?  Quit date: 12/13/1986  ?  Years since quitting: 35.2  ? Smokeless tobacco: Never  ?Vaping Use  ? Vaping Use: Never used  ?Substance and Sexual Activity  ? Alcohol use: No  ?  Alcohol/week: 0.0 standard drinks  ? Drug use: No  ? Sexual activity: Not on file  ?  Comment: married  ?Other Topics Concern  ? Not on file  ?Social History Narrative  ? Married  ? Pt doesn't get reg exercise  ? New pet puppy  ? 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 Ex smoker age 32  ? Going to school  ? ocass caff and etoh.  ? ?Social Determinants of Health  ? ?Financial Resource Strain: Not on file  ?Food Insecurity: Not on file  ?Transportation Needs: Not on file  ?Physical Activity: Not on file  ?Stress: Not on file  ?Social Connections: Not on file  ? ?Vitals:  ? 03/16/22 1141  ?BP: 128/70  ?Pulse: 98  ?Resp: 16  ?Temp: 98.4 ?F (36.9 ?C)  ?SpO2: 98%  ? ?Body mass  index is 35.23 kg/m?. ? ?Physical Exam ?Vitals and nursing note reviewed.  ?Constitutional:   ?   General: She is not in acute distress. ?   Appearance: She is well-developed.  ?HENT:  ?   Head: Normocephalic and atraumatic.  ?   Mouth/Throat:  ?   Mouth: Mucous membranes are moist.  ?Eyes:  ?   Conjunctiva/sclera: Conjunctivae normal.  ?Cardiovascular:  ?   Rate and Rhythm: Normal rate and regular rhythm.  ?Pulmonary:  ?   Effort: Pulmonary effort is normal. No respiratory distress.  ?   Breath sounds: Normal breath sounds.  ?Abdominal:  ?   Palpations: Abdomen is soft. There is no mass.  ?   Tenderness: There is no abdominal  tenderness. There is no right CVA tenderness or left CVA tenderness.  ?Lymphadenopathy:  ?   Cervical: No cervical adenopathy.  ?Skin: ?   General: Skin is warm.  ?   Findings: No ecchymosis or erythema.  ?Neurological:  ?   General: No focal deficit present.  ?   Mental Status: She is alert and oriented to person, place, and time.  ?   Gait: Gait normal.  ?Psychiatric:     ?   Mood and Affect: Mood and affect normal.  ? ?ASSESSMENT AND PLAN: ? ?[name] was seen today for dark urine. ? ?Diagnoses and all orders for this visit: ?Orders Placed This Encounter  ?Procedures  ? Culture, Urine  ? DG Abd 1 View  ? Urinalysis with Reflex Microscopic  ? POCT urinalysis dipstick  ? ? ?Hematuria, unspecified type ?We discussed possible etiologies. ?Urine dipstick positive for protein and blood, negative for leukocytes and nitrate. ?She had labs done today, including BMP and cbc, results are still pending. ?She reporting negative urologic work-up done a few years ago for hematuria. ?Will treat empirically for possible cystitis even thought hx does not suggest an infectious process. ?She was clearly instructed about warning signs. ?Further recommendation will be given according to urine culture results. ? ?-     nitrofurantoin, macrocrystal-monohydrate, (MACROBID) 100 MG capsule; Take 1 capsule (100 mg  total) by mouth 2 (two) times daily for 5 days. ? ?Nephrolithiasis ?Most likely etiology for above symptoms. ?KUB ordered today. ?Urine strainer given. ?If gross hematuria is persistent for more than 7 days

## 2022-03-16 NOTE — Telephone Encounter (Signed)
Pt states she noticed Sunday afternoon that her urine was pink tinged; has noticed this a few times since then. States yesterday she urinated in a cup b/c she wanted to see what color it really was without the dilution of toilet water & describes it as "it looked like coffee".  States light cramping noted yesterday. Denies urgency, frequency, or oliguria.  Appt scheduled for today with Dr Martinique. ?

## 2022-03-17 ENCOUNTER — Telehealth: Payer: Self-pay | Admitting: Internal Medicine

## 2022-03-17 LAB — URINE CULTURE
MICRO NUMBER:: 13220340
Result:: NO GROWTH
SPECIMEN QUALITY:: ADEQUATE

## 2022-03-17 LAB — IRON,TIBC AND FERRITIN PANEL
%SAT: 32 % (calc) (ref 16–45)
Ferritin: 117 ng/mL (ref 16–232)
Iron: 108 ug/dL (ref 45–160)
TIBC: 336 mcg/dL (calc) (ref 250–450)

## 2022-03-17 NOTE — Telephone Encounter (Signed)
I spoke with Dr. Martinique, she wants patient to continue abx & she will go ahead and order CT. ? ?I spoke with patient, she is aware of message above and will continue abx, and wait for a phone call to schedule CT scan. ?

## 2022-03-17 NOTE — Telephone Encounter (Signed)
Patient called in stating that she received her results through [facility] and she wanted to make sure that it was just an UTI. Patient stated that she has taken 2 pills already and still have blood in her urine. ? ?Please advise. ?

## 2022-03-18 DIAGNOSIS — M75102 Unspecified rotator cuff tear or rupture of left shoulder, not specified as traumatic: Secondary | ICD-10-CM | POA: Diagnosis not present

## 2022-03-18 DIAGNOSIS — Z4889 Encounter for other specified surgical aftercare: Secondary | ICD-10-CM | POA: Diagnosis not present

## 2022-03-20 NOTE — Addendum Note (Signed)
Addended by: Martinique, Lucifer Soja G on: 03/20/2022 10:48 PM ? ? Modules accepted: Orders ? ?

## 2022-03-22 NOTE — Progress Notes (Signed)
Blood count , iron studies and chemistry are normal

## 2022-03-23 DIAGNOSIS — Z4889 Encounter for other specified surgical aftercare: Secondary | ICD-10-CM | POA: Diagnosis not present

## 2022-03-23 DIAGNOSIS — M75102 Unspecified rotator cuff tear or rupture of left shoulder, not specified as traumatic: Secondary | ICD-10-CM | POA: Diagnosis not present

## 2022-03-24 ENCOUNTER — Ambulatory Visit (INDEPENDENT_AMBULATORY_CARE_PROVIDER_SITE_OTHER)
Admission: RE | Admit: 2022-03-24 | Discharge: 2022-03-24 | Disposition: A | Discharge status: Home / Self Care | Payer: BC Managed Care – PPO | Source: Ambulatory Visit | Attending: Family Medicine | Admitting: Family Medicine

## 2022-03-24 DIAGNOSIS — N201 Calculus of ureter: Secondary | ICD-10-CM | POA: Diagnosis not present

## 2022-03-24 DIAGNOSIS — I700 Atherosclerosis of aorta: Secondary | ICD-10-CM | POA: Diagnosis not present

## 2022-03-24 DIAGNOSIS — R310 Gross hematuria: Secondary | ICD-10-CM | POA: Diagnosis not present

## 2022-03-24 DIAGNOSIS — R319 Hematuria, unspecified: Secondary | ICD-10-CM | POA: Diagnosis not present

## 2022-03-26 ENCOUNTER — Other Ambulatory Visit: Payer: Self-pay | Admitting: Family Medicine

## 2022-03-26 MED ORDER — TAMSULOSIN HCL 0.4 MG PO CAPS: 0.4000 mg | ORAL_CAPSULE | Freq: Every day | ORAL | 0 refills | Status: DC

## 2022-03-26 NOTE — Addendum Note (Signed)
Addended by: Martinique, Shiree Altemus G on: 03/26/2022 05:53 PM ? ? Modules accepted: Orders ? ?

## 2022-03-30 DIAGNOSIS — M75102 Unspecified rotator cuff tear or rupture of left shoulder, not specified as traumatic: Secondary | ICD-10-CM | POA: Diagnosis not present

## 2022-03-30 DIAGNOSIS — Z4889 Encounter for other specified surgical aftercare: Secondary | ICD-10-CM | POA: Diagnosis not present

## 2022-04-06 DIAGNOSIS — M75102 Unspecified rotator cuff tear or rupture of left shoulder, not specified as traumatic: Secondary | ICD-10-CM | POA: Diagnosis not present

## 2022-04-06 DIAGNOSIS — Z4889 Encounter for other specified surgical aftercare: Secondary | ICD-10-CM | POA: Diagnosis not present

## 2022-04-15 DIAGNOSIS — M67912 Unspecified disorder of synovium and tendon, left shoulder: Secondary | ICD-10-CM | POA: Diagnosis not present

## 2022-04-15 DIAGNOSIS — M75102 Unspecified rotator cuff tear or rupture of left shoulder, not specified as traumatic: Secondary | ICD-10-CM | POA: Diagnosis not present

## 2022-04-15 DIAGNOSIS — Z4889 Encounter for other specified surgical aftercare: Secondary | ICD-10-CM | POA: Diagnosis not present

## 2022-04-21 DIAGNOSIS — Z713 Dietary counseling and surveillance: Secondary | ICD-10-CM | POA: Diagnosis not present

## 2022-04-26 ENCOUNTER — Telehealth: Payer: Self-pay

## 2022-04-26 NOTE — Telephone Encounter (Signed)
--  Caller states she has blood in her urine. She has ?a kidney stone. She was put on Flomax. She is also ?having cold symptoms. April 12th diagnosed with a ?small stone. History of Kidney stones. Starting taking ?Flomax this pass Monday. ? ?04/24/2022 9:08:13 AM See HCP within 4 Hours (or PCP triage) Marcello Moores, RN, Lauro Regulus ? ?Referrals ?REFERRED TO PCP OFFICE ? ?04/26/22 1106 - LVM instructions for pt to call office if needed. ?

## 2022-04-28 DIAGNOSIS — N201 Calculus of ureter: Secondary | ICD-10-CM | POA: Diagnosis not present

## 2022-04-29 ENCOUNTER — Encounter: Payer: Self-pay | Admitting: Internal Medicine

## 2022-04-29 ENCOUNTER — Telehealth: Payer: Self-pay | Admitting: Internal Medicine

## 2022-04-29 NOTE — Telephone Encounter (Signed)
calling  with an update, saw a urologist. was given a few more weeks to pass the stone on her own and if not they may do a stent. was given pain meds to help

## 2022-04-29 NOTE — Telephone Encounter (Signed)
Pt returning call to

## 2022-04-30 NOTE — Telephone Encounter (Signed)
FYI

## 2022-05-12 DIAGNOSIS — N201 Calculus of ureter: Secondary | ICD-10-CM | POA: Diagnosis not present

## 2022-05-14 ENCOUNTER — Other Ambulatory Visit: Payer: Self-pay | Admitting: Urology

## 2022-05-17 ENCOUNTER — Encounter (HOSPITAL_BASED_OUTPATIENT_CLINIC_OR_DEPARTMENT_OTHER): Payer: Self-pay | Admitting: Urology

## 2022-05-17 ENCOUNTER — Telehealth: Payer: Self-pay | Admitting: Internal Medicine

## 2022-05-17 NOTE — Telephone Encounter (Signed)
Clinical pharmacist to review Xarelto 

## 2022-05-17 NOTE — Telephone Encounter (Signed)
   Pre-operative Risk Assessment    Patient Name: Gail [name]  DOB: 1966-10-16 MRN: [id number]      Request for Surgical Clearance    Procedure:   Ureteroscopy to remove Kidney Stone  Date of Surgery:  Clearance 05/25/22                                 Surgeon:  [facility] Surgeon's Group or Practice Name:  Dr. Claudia Desanctis Phone number:  [phone] Fax number:  [phone]   Type of Clearance Requested:   - Pharmacy:  Hold Rivaroxaban (Xarelto) Hold 72 hour prior to surgery   Type of Anesthesia:  General    Additional requests/questions:  Please advise surgeon/provider what medications should be held.  Signed, Belisicia T Kenton Kingfisher   05/17/2022, 12:37 PM

## 2022-05-17 NOTE — Progress Notes (Signed)
ACUTE VISIT Chief Complaint  Patient presents with   Cough    Started memorial day, has been using dyelsm but helping.   Nasal Congestion    Ongoing since May 15th, has surgery set for Tuesday next week.   HPI: [name] is a 56 y.o. female with hx of nephrolithiasis, paroxysmal atrial fib on chronic anticoagulation, HTN, and back pain here today complaining of 2 weeks of intermittent respiratory symptoms. 05/07/22 she felt like she thought symptoms have cleared up. Sore throat has resolved. She has not noted fever.  05/08/22 she started with productive cough again, yellowish sputum and wheezing for the past 5 days. No hx of asthma,has had wheezing in the past with URI. Former smoker.  Cough The cough is Productive of sputum. Associated symptoms include nasal congestion, postnasal drip, rhinorrhea and wheezing. Pertinent negatives include no chest pain, chills, ear congestion, ear pain, fever, headaches, heartburn, hemoptysis, myalgias, rash, sore throat, shortness of breath, sweats or weight loss. The symptoms are aggravated by lying down and exercise. Risk factors for lung disease include smoking/tobacco exposure. She has tried OTC cough suppressant for the symptoms. The treatment provided no relief. Her past medical history is significant for environmental allergies. There is no history of asthma.   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 Home COVID 19 test negative.  Having urologic office procedure, cystoscopy with retrograde pyelogram, colonoscopy, and stent placemen on 05/25/22.  Review of Systems  Constitutional:  Negative for chills, fever and weight loss.  HENT:  Positive for postnasal drip and rhinorrhea. Negative for ear pain and sore throat.   Respiratory:  Positive for cough and wheezing. Negative for hemoptysis and shortness of breath.   Cardiovascular:  Negative for chest pain.  Gastrointestinal:   Negative for heartburn.  Musculoskeletal:  Negative for myalgias.  Skin:  Negative for rash.  Allergic/Immunologic: Positive for environmental allergies.  Neurological:  Negative for headaches.  Hematological:  Negative for adenopathy. Does not bruise/bleed easily.  Rest see pertinent positives and negatives per HPI.  Current Outpatient Medications on File Prior to Visit  Medication Sig Dispense Refill   diltiazem (CARDIZEM CD) 360 MG 24 hr capsule TAKE 1 CAPSULE(360 MG) BY MOUTH DAILY (Patient taking differently: 360 mg at bedtime. TAKE 1 CAPSULE(360 MG) BY MOUTH DAILY) 90 capsule 3   fluticasone (FLONASE) 50 MCG/ACT nasal spray 2 sprays per nostril daily.  MUST HAVE OFFICE VISIT FOR FURTHER REFILLS. (Patient taking differently: 2 sprays at bedtime. 2 sprays per nostril daily.  MUST HAVE OFFICE VISIT FOR FURTHER REFILLS.) 16 g 0   Lactobacillus (ACIDOPHILUS PO) Take 1 capsule by mouth at bedtime.     levonorgestrel (MIRENA, 52 MG,) 20 MCG/24HR IUD Mirena 20 mcg/24 hours (6 yrs) 52 mg intrauterine device  Take 1 device by intrauterine route.     losartan (COZAAR) 50 MG tablet TAKE 1 TABLET(50 MG) BY MOUTH DAILY (Patient taking differently: Take 50 mg by mouth at bedtime. TAKE 1 TABLET(50 MG) BY MOUTH DAILY) 90 tablet 3   metFORMIN (GLUCOPHAGE-XR) 500 MG 24 hr tablet Take 1 tablet (500 mg total) by mouth daily with breakfast. 90 tablet 3   metroNIDAZOLE (METROGEL) 0.75 % gel Apply 1 application topically 2 (two) times daily. For rosacea (Patient taking differently: Apply 1 application. topically 2 (two) times daily as needed. For rosacea) 45 g 1   nystatin ointment (MYCOSTATIN) APPLY TOPICALLY AS DIRECTED 30 g 0   oxyCODONE (  OXY IR/ROXICODONE) 5 MG immediate release tablet Take 5 mg by mouth every 6 (six) hours as needed.     Polyethylene Glycol 3350 (MIRALAX PO) Take by mouth as needed.     RESTASIS 0.05 % ophthalmic emulsion Place 1 drop into both eyes every morning.      rivaroxaban (XARELTO)  20 MG TABS tablet Take 1 tablet (20 mg total) by mouth daily with supper. 30 tablet 6   tamsulosin (FLOMAX) 0.4 MG CAPS capsule TAKE 1 CAPSULE(0.4 MG) BY MOUTH DAILY (Patient taking differently: 0.4 mg daily after supper.) 90 capsule 0   tizanidine (ZANAFLEX) 2 MG capsule Take 2 mg by mouth 3 (three) times daily as needed for muscle spasms.     vitamin B-12 (CYANOCOBALAMIN) 500 MCG tablet Take 1,000 mcg by mouth daily.     Zinc Oxide 30.6 % CREA Apply 1 application topically every morning.     No current facility-administered medications on file prior to visit.   Past Medical History:  Diagnosis Date   Anticoagulant long-term use    xarelto--- managed by dr g. taylor   Chronic pain of both shoulders    Family history of adverse reaction to anesthesia    mother--- ponv   History of anal fissures    History of infertility, female    History of kidney stones    Hyperlipidemia    Hypertension    IBS (irritable bowel syndrome)    Left ureteral stone    OA (osteoarthritis)    PAF (paroxysmal atrial fibrillation) (HCC)    PCO (polycystic ovaries)    PONV (postoperative nausea and vomiting)    RLS (restless legs syndrome)    Rosacea    Type 2 diabetes mellitus (HCC)    Allergies  Allergen Reactions   Ace Inhibitors Cough   Erythromycin Ethylsuccinate     Stomach hurts REACTION: unspecified   can take azithromycin   Doxycycline Rash   Latex Rash   Social History   Socioeconomic History   Marital status: Married    Spouse name: Not on file   Number of children: 0   Years of education: Not on file   Highest education level: Not on file  Occupational History   Occupation: preschool Product manager: [employer]  Tobacco Use   Smoking status: Former    Packs/day: 0.50    Years: 7.00    Total pack years: 3.50    Types: Cigarettes    Quit date: 1988    Years since quitting: 35.4   Smokeless tobacco: Never  Vaping Use   Vaping Use: Never used  Substance and  Sexual Activity   Alcohol use: No    Alcohol/week: 0.0 standard drinks of alcohol   Drug use: No   Sexual activity: Not on file    Comment: married  Other Topics Concern   Not on file  Social History Narrative   Married   Pt doesn't get reg exercise   New pet puppy   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   Social Determinants of Health   Financial Resource Strain: Not on file  Food Insecurity: Not on file  Transportation Needs: Not on file  Physical Activity: Not on file  Stress: Not on file  Social Connections: Not on file   Vitals:   05/18/22 1510  BP: 110/80  Pulse: 93  Resp: 16  Temp: 98.6 F (37 C)  SpO2: 98%   Body mass index is 35.15 kg/m.  Physical Exam Vitals and nursing note reviewed.  Constitutional:      General: She is not in acute distress.    Appearance: She is well-developed. She is not ill-appearing.  HENT:     Head: Normocephalic and atraumatic.     Right Ear: Tympanic membrane, ear canal and external ear normal.     Left Ear: Tympanic membrane, ear canal and external ear normal.     Nose: Rhinorrhea present.     Right Sinus: No maxillary sinus tenderness or frontal sinus tenderness.     Left Sinus: No maxillary sinus tenderness or frontal sinus tenderness.     Mouth/Throat:     Mouth: Mucous membranes are moist.     Pharynx: Oropharynx is clear.  Eyes:     Conjunctiva/sclera: Conjunctivae normal.  Cardiovascular:     Rate and Rhythm: Normal rate and regular rhythm.     Heart sounds: No murmur heard. Pulmonary:     Effort: Pulmonary effort is normal. No respiratory distress.     Breath sounds: No stridor. Wheezing (MIld) present. No rhonchi or rales.  Lymphadenopathy:     Cervical: No cervical adenopathy.  Skin:    General: Skin is warm.     Findings: No erythema or rash.  Neurological:     Mental Status: She is alert and oriented to person, place,  and time.  Psychiatric:        Mood and Affect: Mood is anxious.     Comments: Well groomed, good eye contact.   ASSESSMENT AND PLAN:  Ms.Devynn was seen today for cough and nasal congestion.  Diagnoses and all orders for this visit:  Respiratory tract infection Symptoms she is reporting could be caused by viral infection and/or allergies. Examination today does not suggest a serious process. Because symptoms are persistent and having urologic procedure in a few days, abx was prescribed. Hycodan to take at bedtime for cough prn. We can consider imaging if symptoms are persistent. Instructed about warning signs.  -     azithromycin (ZITHROMAX) 250 MG tablet; 2 tabs day one then 1 tab daily for 4 days. -     HYDROcodone bit-homatropine (HYCODAN) 5-1.5 MG/5ML syrup; Take 5 mLs by mouth every 12 (twelve) hours as needed for up to 10 days for cough.  Reactive airway disease without asthma Here in the office and after verbal consent she received neb treatment with Duoneb, tolerated well and feeling better. Lung auscultation after treatment negative for rales or wheezing. Albuterol inh 2 puff every 6 hours for a week then as needed for wheezing or shortness of breath.  Prednisone 40 mg daily with breakfast x 3-5 days. Some side effects of medications prescribed today discussed. F/U in 2 weeks, before if needed.  -     ipratropium-albuterol (DUONEB) 0.5-2.5 (3) MG/3ML nebulizer solution 3 mL -     albuterol (VENTOLIN HFA) 108 (90 Base) MCG/ACT inhaler; Inhale 2 puffs into the lungs every 6 (six) hours as needed for wheezing or shortness of breath. -     predniSONE (DELTASONE) 20 MG tablet; Take 2 tablets (40 mg total) by mouth daily with breakfast for 5 days.  I spent a total of 33 minutes in both face to face and non face to face activities for this visit (excluding neb treatment time) on the date of this encounter. During this time history was  obtained and documented, examination was  performed, and assessment/plan discussed.  Return in about 2 weeks (around 06/01/2022) for RAD with PCP.  Louetta Hollingshead G. Martinique, MD  [facility]. [place] office.

## 2022-05-17 NOTE — Progress Notes (Signed)
Spoke w/ via phone for pre-op interview--- pt Lab needs dos----  [employer] results------ current ekg in epic/ chart COVID test -----patient states asymptomatic no test needed Arrive at ------- 0530 on 05-25-2022 NPO after MN NO Solid Food.  Clear liquids from MN until--- 0430 Med rec completed Medications to take morning of surgery ----- eye drops as usual Diabetic medication ----- n/a Patient instructed no nail polish to be worn day of surgery Patient instructed to bring photo id and insurance card day of surgery Patient aware to have Driver (ride ) / caregiver  or 24 hours after surgery --- husband, hal  Patient Special Instructions ----- advised pt needs instructions about xarelto to stop or not from dr pace office Pre-Op special Istructions ----- called and left message w/ selita, OR scheduler for dr pace, inquiring if pt to stop xarelto or not , if so will need clearance from cardiology, dr g. Lovena Le.  Patient verbalized understanding of instructions that were given at this phone interview. Patient denies shortness of breath, chest pain, fever, cough at this phone interview.

## 2022-05-18 ENCOUNTER — Telehealth: Payer: Self-pay | Admitting: Internal Medicine

## 2022-05-18 ENCOUNTER — Ambulatory Visit (INDEPENDENT_AMBULATORY_CARE_PROVIDER_SITE_OTHER): Payer: BC Managed Care – PPO | Admitting: Family Medicine

## 2022-05-18 ENCOUNTER — Encounter: Payer: Self-pay | Admitting: Family Medicine

## 2022-05-18 VITALS — BP 110/80 | HR 93 | Temp 98.6°F | Resp 16 | Ht 60.0 in | Wt 180.0 lb

## 2022-05-18 DIAGNOSIS — J988 Other specified respiratory disorders: Secondary | ICD-10-CM | POA: Diagnosis not present

## 2022-05-18 DIAGNOSIS — J989 Respiratory disorder, unspecified: Secondary | ICD-10-CM

## 2022-05-18 MED ORDER — PREDNISONE 20 MG PO TABS: 40.0000 mg | ORAL_TABLET | Freq: Every day | ORAL | 0 refills | Status: AC

## 2022-05-18 MED ORDER — HYDROCODONE BIT-HOMATROP MBR 5-1.5 MG/5ML PO SOLN: 5.0000 mL | Freq: Two times a day (BID) | ORAL | 0 refills | Status: AC | PRN

## 2022-05-18 MED ORDER — ALBUTEROL SULFATE HFA 108 (90 BASE) MCG/ACT IN AERS: 2.0000 | INHALATION_SPRAY | Freq: Four times a day (QID) | RESPIRATORY_TRACT | 0 refills | Status: DC | PRN

## 2022-05-18 MED ORDER — AZITHROMYCIN 250 MG PO TABS: ORAL_TABLET | ORAL | 0 refills | Status: AC

## 2022-05-18 MED ORDER — IPRATROPIUM-ALBUTEROL 0.5-2.5 (3) MG/3ML IN SOLN
3.0000 mL | Freq: Once | RESPIRATORY_TRACT | Status: AC
  Administered 2022-05-18: 3 mL via RESPIRATORY_TRACT

## 2022-05-18 NOTE — Telephone Encounter (Signed)
Patient with diagnosis of a fib on Xarelto for anticoagulation.    Procedure: Ureteroscopy to remove Kidney Stone   Date of procedure: 05/25/22   CHA2DS2-VASc Score = 3  This indicates a 3.2% annual risk of stroke. The patient's score is based upon: CHF History: 0 HTN History: 1 Diabetes History: 1 Stroke History: 0 Vascular Disease History: 0 Age Score: 0 Gender Score: 1    CrCl 86 mL/min using adjusted body weight Platelet count 229K   Per office protocol, patient can hold Xarelto for 3 days prior to procedure.

## 2022-05-18 NOTE — Patient Instructions (Signed)
A few things to remember from today's visit:  Respiratory tract infection - Plan: ipratropium-albuterol (DUONEB) 0.5-2.5 (3) MG/3ML nebulizer solution 3 mL, albuterol (VENTOLIN HFA) 108 (90 Base) MCG/ACT inhaler, predniSONE (DELTASONE) 20 MG tablet  Reactive airway disease without asthma - Plan: azithromycin (ZITHROMAX) 250 MG tablet  If you need refills please call your pharmacy. Do not use My Chart to request refills or for acute issues that need immediate attention.  Take prednisone with breakfast for 3 to 5 days. Albuterol inh 2 puff every 6 hours for a week then as needed for wheezing or shortness of breath.  It does not seem to be infectious but because you have been procedure in a few days, antibiotic was prescribed.  Please be sure medication list is accurate. If a new problem present, please set up appointment sooner than planned today.

## 2022-05-18 NOTE — Telephone Encounter (Signed)
Rx has been sent in. 

## 2022-05-18 NOTE — Telephone Encounter (Signed)
Pt states a hydrocodone cough syrup was discussed at her appointment today. The pharmacy does not have it, patient is checking on that prescription.  She is at the pharmacy now.  [facility] [place], [place] - [address] AT [address] OF [place] [address] Phone:  [phone]  Fax:  [phone]

## 2022-05-20 ENCOUNTER — Encounter: Payer: Self-pay | Admitting: Family Medicine

## 2022-05-20 NOTE — Progress Notes (Signed)
Spoke with dr Legrand Como foster mda and made aware pt is on nebulizer and steroids for recent uri. Patient surgery needs to be moved to 2 weeks later if case can wait, if surgery cannot wait per dr pace, patient can be done at [facility]  per dr Legrand Como foster if dr pace does not wish to  move case to a date 2 weeks out., left selita at dr pace message of the above charted information about surgery recommendations from dr Legrand Como foster mda.

## 2022-05-25 DIAGNOSIS — Z01818 Encounter for other preprocedural examination: Secondary | ICD-10-CM

## 2022-05-28 DIAGNOSIS — N201 Calculus of ureter: Secondary | ICD-10-CM | POA: Diagnosis not present

## 2022-05-28 DIAGNOSIS — N132 Hydronephrosis with renal and ureteral calculous obstruction: Secondary | ICD-10-CM | POA: Diagnosis not present

## 2022-06-01 DIAGNOSIS — L718 Other rosacea: Secondary | ICD-10-CM | POA: Diagnosis not present

## 2022-06-01 DIAGNOSIS — L821 Other seborrheic keratosis: Secondary | ICD-10-CM | POA: Diagnosis not present

## 2022-06-01 DIAGNOSIS — L814 Other melanin hyperpigmentation: Secondary | ICD-10-CM | POA: Diagnosis not present

## 2022-06-01 DIAGNOSIS — D225 Melanocytic nevi of trunk: Secondary | ICD-10-CM | POA: Diagnosis not present

## 2022-06-01 DIAGNOSIS — D485 Neoplasm of uncertain behavior of skin: Secondary | ICD-10-CM | POA: Diagnosis not present

## 2022-06-01 DIAGNOSIS — C4401 Basal cell carcinoma of skin of lip: Secondary | ICD-10-CM | POA: Diagnosis not present

## 2022-06-02 NOTE — Patient Instructions (Signed)
DUE TO COVID-19 ONLY TWO VISITORS  (aged 54 and older)  ARE ALLOWED TO COME WITH YOU AND STAY IN THE WAITING ROOM ONLY DURING PRE OP AND PROCEDURE.   **NO VISITORS ARE ALLOWED IN THE SHORT STAY AREA OR RECOVERY ROOM!!**  IF YOU WILL BE ADMITTED INTO THE HOSPITAL YOU ARE ALLOWED ONLY FOUR SUPPORT PEOPLE DURING VISITATION HOURS ONLY (7 AM -8PM)   The support person(s) must pass our screening, gel in and out, and wear a mask at all times, including in the patient's room. Patients must also wear a mask when staff or their support person are in the room. Visitors GUEST BADGE MUST BE WORN VISIBLY  One adult visitor may remain with you overnight and MUST be in the room by 8 P.M.     Your procedure is scheduled on: 06/11/22   Report to [facility] Main Entrance    Report to admitting at : 8:15 AM   Call this number if you have problems the morning of surgery [phone]   Do not eat food :After Midnight.   After Midnight you may have the following liquids until: 7:30 AM DAY OF SURGERY  Water Black Coffee (sugar ok, NO MILK/CREAM OR CREAMERS)  Tea (sugar ok, NO MILK/CREAM OR CREAMERS) regular and decaf                             Plain Jell-O (NO RED)                                           Fruit ices (not with fruit pulp, NO RED)                                     Popsicles (NO RED)                                                                  Juice: apple, WHITE grape, WHITE cranberry Sports drinks like Gatorade (NO RED) Clear broth(vegetable,chicken,beef)    Oral Hygiene is also important to reduce your risk of infection.                                    Remember - BRUSH YOUR TEETH THE MORNING OF SURGERY WITH YOUR REGULAR TOOTHPASTE   Do NOT smoke after Midnight   Take these medicines the morning of surgery with A SIP OF WATER: diltiazem,amoxicillin.Eye drops and inhalers as usual.  How to Manage Your Diabetes Before and After Surgery  Why is it important to  control my blood sugar before and after surgery? Improving blood sugar levels before and after surgery helps healing and can limit problems. A way of improving blood sugar control is eating a healthy diet by:  Eating less sugar and carbohydrates  Increasing activity/exercise  Talking with your doctor about reaching your blood sugar goals High blood sugars (greater than 180 mg/dL) can raise your risk of infections and slow your recovery, so  you will need to focus on controlling your diabetes during the weeks before surgery. Make sure that the doctor who takes care of your diabetes knows about your planned surgery including the date and location.  How do I manage my blood sugar before surgery? Check your blood sugar at least 4 times a day, starting 2 days before surgery, to make sure that the level is not too high or low. Check your blood sugar the morning of your surgery when you wake up and every 2 hours until you get to the Short Stay unit. If your blood sugar is less than 70 mg/dL, you will need to treat for low blood sugar: Do not take insulin. Treat a low blood sugar (less than 70 mg/dL) with  cup of clear juice (cranberry or apple), 4 glucose tablets, OR glucose gel. Recheck blood sugar in 15 minutes after treatment (to make sure it is greater than 70 mg/dL). If your blood sugar is not greater than 70 mg/dL on recheck, call [phone] for further instructions. Report your blood sugar to the short stay nurse when you get to Short Stay.  If you are admitted to the hospital after surgery: Your blood sugar will be checked by the staff and you will probably be given insulin after surgery (instead of oral diabetes medicines) to make sure you have good blood sugar levels. The goal for blood sugar control after surgery is 80-180 mg/dL.   WHAT DO I DO ABOUT MY DIABETES MEDICATION?  Do not take oral diabetes medicines (pills) the morning of surgery.  THE NIGHT BEFORE SURGERY, take metformin  as usual.     THE MORNING OF SURGERY, DO NOT TAKE ANY ORAL DIABETIC MEDICATIONS DAY OF YOUR SURGERY  Bring CPAP mask and tubing day of surgery.                              You may not have any metal on your body including hair pins, jewelry, and body piercing             Do not wear make-up, lotions, powders, perfumes/cologne, or deodorant  Do not wear nail polish including gel and S&S, artificial/acrylic nails, or any other type of covering on natural nails including finger and toenails. If you have artificial nails, gel coating, etc. that needs to be removed by a nail salon please have this removed prior to surgery or surgery may need to be canceled/ delayed if the surgeon/ anesthesia feels like they are unable to be safely monitored.   Do not shave  48 hours prior to surgery.   Do not bring valuables to the hospital. [place].   Contacts, dentures or bridgework may not be worn into surgery.   Bring small overnight bag day of surgery.   DO NOT [place]. PHARMACY WILL DISPENSE MEDICATIONS LISTED ON YOUR MEDICATION LIST TO YOU DURING YOUR ADMISSION [place]!    Patients discharged on the day of surgery will not be allowed to drive home.  Someone NEEDS to stay with you for the first 24 hours after anesthesia.   Special Instructions: Bring a copy of your healthcare power of attorney and living will documents         the day of surgery if you haven't scanned them before.  Please read over the following fact sheets you were given: IF YOU HAVE QUESTIONS ABOUT YOUR PRE-OP INSTRUCTIONS PLEASE CALL [phone]     [facility] - Preparing for Surgery Before surgery, you can play an important role.  Because skin is not sterile, your skin needs to be as free of germs as possible.  You can reduce the number of germs on your skin by washing with CHG (chlorahexidine gluconate) soap before  surgery.  CHG is an antiseptic cleaner which kills germs and bonds with the skin to continue killing germs even after washing. Please DO NOT use if you have an allergy to CHG or antibacterial soaps.  If your skin becomes reddened/irritated stop using the CHG and inform your nurse when you arrive at Short Stay. Do not shave (including legs and underarms) for at least 48 hours prior to the first CHG shower.  You may shave your face/neck. Please follow these instructions carefully:  1.  Shower with CHG Soap the night before surgery and the  morning of Surgery.  2.  If you choose to wash your hair, wash your hair first as usual with your  normal  shampoo.  3.  After you shampoo, rinse your hair and body thoroughly to remove the  shampoo.                           4.  Use CHG as you would any other liquid soap.  You can apply chg directly  to the skin and wash                       Gently with a scrungie or clean washcloth.  5.  Apply the CHG Soap to your body ONLY FROM THE NECK DOWN.   Do not use on face/ open                           Wound or open sores. Avoid contact with eyes, ears mouth and genitals (private parts).                       Wash face,  Genitals (private parts) with your normal soap.             6.  Wash thoroughly, paying special attention to the area where your surgery  will be performed.  7.  Thoroughly rinse your body with warm water from the neck down.  8.  DO NOT shower/wash with your normal soap after using and rinsing off  the CHG Soap.                9.  Pat yourself dry with a clean towel.            10.  Wear clean pajamas.            11.  Place clean sheets on your bed the night of your first shower and do not  sleep with pets. Day of Surgery : Do not apply any lotions/deodorants the morning of surgery.  Please wear clean clothes to the hospital/surgery center.  FAILURE TO FOLLOW THESE INSTRUCTIONS MAY RESULT IN THE CANCELLATION OF YOUR SURGERY PATIENT  SIGNATURE_________________________________  NURSE SIGNATURE__________________________________  ________________________________________________________________________

## 2022-06-03 ENCOUNTER — Encounter (HOSPITAL_COMMUNITY)
Admission: RE | Admit: 2022-06-03 | Discharge: 2022-06-03 | Disposition: A | Discharge status: Home / Self Care | Payer: BC Managed Care – PPO | Source: Ambulatory Visit | Attending: Urology | Admitting: Urology

## 2022-06-03 ENCOUNTER — Other Ambulatory Visit: Payer: Self-pay

## 2022-06-03 ENCOUNTER — Encounter (HOSPITAL_COMMUNITY): Payer: Self-pay

## 2022-06-03 VITALS — BP 129/69 | HR 88 | Temp 98.3°F | Ht 60.0 in | Wt 182.0 lb

## 2022-06-03 DIAGNOSIS — I10 Essential (primary) hypertension: Secondary | ICD-10-CM | POA: Insufficient documentation

## 2022-06-03 DIAGNOSIS — R7309 Other abnormal glucose: Secondary | ICD-10-CM | POA: Insufficient documentation

## 2022-06-03 DIAGNOSIS — Z01812 Encounter for preprocedural laboratory examination: Secondary | ICD-10-CM | POA: Insufficient documentation

## 2022-06-03 DIAGNOSIS — Z01818 Encounter for other preprocedural examination: Secondary | ICD-10-CM

## 2022-06-03 DIAGNOSIS — N201 Calculus of ureter: Secondary | ICD-10-CM | POA: Diagnosis not present

## 2022-06-03 HISTORY — DX: Cardiac arrhythmia, unspecified: I49.9

## 2022-06-03 HISTORY — DX: Cardiac murmur, unspecified: R01.1

## 2022-06-03 HISTORY — DX: Anxiety disorder, unspecified: F41.9

## 2022-06-03 LAB — CBC
HCT: 41.9 % (ref 36.0–46.0)
Hemoglobin: 13.8 g/dL (ref 12.0–15.0)
MCH: 31.6 pg (ref 26.0–34.0)
MCHC: 32.9 g/dL (ref 30.0–36.0)
MCV: 95.9 fL (ref 80.0–100.0)
Platelets: 239 10*3/uL (ref 150–400)
RBC: 4.37 MIL/uL (ref 3.87–5.11)
RDW: 12.4 % (ref 11.5–15.5)
WBC: 6.5 10*3/uL (ref 4.0–10.5)
nRBC: 0 % (ref 0.0–0.2)

## 2022-06-03 LAB — BASIC METABOLIC PANEL WITH GFR
Anion gap: 7 (ref 5–15)
BUN: 13 mg/dL (ref 6–20)
CO2: 26 mmol/L (ref 22–32)
Calcium: 9.1 mg/dL (ref 8.9–10.3)
Chloride: 109 mmol/L (ref 98–111)
Creatinine, Ser: 0.76 mg/dL (ref 0.44–1.00)
GFR, Estimated: >60 mL/min
Glucose, Bld: 119 mg/dL — ABNORMAL HIGH (ref 70–99)
Potassium: 3.7 mmol/L (ref 3.5–5.1)
Sodium: 142 mmol/L (ref 135–145)

## 2022-06-03 LAB — HEMOGLOBIN A1C
Hgb A1c MFr Bld: 5.9 % — ABNORMAL HIGH (ref 4.8–5.6)
Mean Plasma Glucose: 122.63 mg/dL

## 2022-06-03 LAB — GLUCOSE, CAPILLARY: Glucose-Capillary: 141 mg/dL — ABNORMAL HIGH (ref 70–99)

## 2022-06-03 NOTE — Progress Notes (Signed)
For Short Stay: [place] appointment date: Date of COVID positive in last 62 days:  Bowel Prep reminder:   For Anesthesia: PCP - Dr. Shanon Ace Cardiologist - Dr. [name] : Clearance: 05/17/22 : EPIC  Chest x-ray -  EKG - 02/11/22 Stress Test -  ECHO - 07/07/11 Cardiac Cath -  Pacemaker/ICD device last checked: Pacemaker orders received: Device Rep notified:  Spinal Cord Stimulator:  Sleep Study -  CPAP -   Fasting Blood Sugar - N/A Checks Blood Sugar ___0__ times a day Date and result of last Hgb A1c-  Blood Thinner Instructions: Xarelto can be hold 3 days before surgery: Dr. Lovena Le. Aspirin Instructions: Last Dose:  Activity level: Can go up a flight of stairs and activities of daily living without stopping and without chest pain and/or shortness of breath   Able to exercise without chest pain and/or shortness of breath   Unable to go up a flight of stairs without chest pain and/or shortness of breath     Anesthesia review: Hx: DIA,HTN  Patient denies shortness of breath, fever, cough and chest pain at PAT appointment   Patient verbalized understanding of instructions that were given to them at the PAT appointment. Patient was also instructed that they will need to review over the PAT instructions again at home before surgery.

## 2022-06-04 ENCOUNTER — Encounter (HOSPITAL_COMMUNITY): Payer: Self-pay | Admitting: Physician Assistant

## 2022-06-07 DIAGNOSIS — N201 Calculus of ureter: Secondary | ICD-10-CM | POA: Diagnosis not present

## 2022-06-08 ENCOUNTER — Encounter: Payer: Self-pay | Admitting: Internal Medicine

## 2022-06-08 ENCOUNTER — Ambulatory Visit: Payer: BC Managed Care – PPO | Admitting: Internal Medicine

## 2022-06-08 ENCOUNTER — Ambulatory Visit (INDEPENDENT_AMBULATORY_CARE_PROVIDER_SITE_OTHER): Payer: BC Managed Care – PPO | Admitting: Internal Medicine

## 2022-06-08 VITALS — BP 114/68 | HR 93 | Ht 60.0 in | Wt 185.8 lb

## 2022-06-08 DIAGNOSIS — R609 Edema, unspecified: Secondary | ICD-10-CM

## 2022-06-08 MED ORDER — POTASSIUM CHLORIDE ER 10 MEQ PO TBCR: 10.0000 meq | EXTENDED_RELEASE_TABLET | Freq: Every day | ORAL | 0 refills | Status: DC

## 2022-06-08 MED ORDER — FUROSEMIDE 40 MG PO TABS: 40.0000 mg | ORAL_TABLET | Freq: Every day | ORAL | 0 refills | Status: DC

## 2022-06-09 DIAGNOSIS — Z01419 Encounter for gynecological examination (general) (routine) without abnormal findings: Secondary | ICD-10-CM | POA: Diagnosis not present

## 2022-06-09 DIAGNOSIS — Z130 Encounter for screening for diseases of the blood and blood-forming organs and certain disorders involving the immune mechanism: Secondary | ICD-10-CM | POA: Diagnosis not present

## 2022-06-11 ENCOUNTER — Ambulatory Visit (HOSPITAL_COMMUNITY): Admission: RE | Admit: 2022-06-11 | Payer: BC Managed Care – PPO | Source: Home / Self Care | Admitting: Urology

## 2022-06-11 DIAGNOSIS — Z4889 Encounter for other specified surgical aftercare: Secondary | ICD-10-CM | POA: Diagnosis not present

## 2022-06-11 DIAGNOSIS — M75102 Unspecified rotator cuff tear or rupture of left shoulder, not specified as traumatic: Secondary | ICD-10-CM | POA: Diagnosis not present

## 2022-06-11 DIAGNOSIS — Z01818 Encounter for other preprocedural examination: Secondary | ICD-10-CM

## 2022-06-11 HISTORY — DX: Personal history of other diseases of the digestive system: Z87.19

## 2022-06-11 HISTORY — DX: Personal history of urinary calculi: Z87.442

## 2022-06-11 HISTORY — DX: Long term (current) use of anticoagulants: Z79.01

## 2022-06-11 HISTORY — DX: Restless legs syndrome: G25.81

## 2022-06-11 HISTORY — DX: Calculus of ureter: N20.1

## 2022-06-11 HISTORY — DX: Paroxysmal atrial fibrillation: I48.0

## 2022-06-11 HISTORY — DX: Nausea with vomiting, unspecified: R11.2

## 2022-06-11 HISTORY — DX: Other specified postprocedural states: Z98.890

## 2022-06-11 HISTORY — DX: Type 2 diabetes mellitus without complications: E11.9

## 2022-06-11 HISTORY — DX: Other chronic pain: M25.511

## 2022-06-11 HISTORY — DX: Unspecified osteoarthritis, unspecified site: M19.90

## 2022-06-11 HISTORY — DX: Family history of other specified conditions: Z84.89

## 2022-06-11 HISTORY — DX: Other chronic pain: G89.29

## 2022-06-11 SURGERY — CYSTOURETEROSCOPY, WITH RETROGRADE PYELOGRAM AND STENT INSERTION
Anesthesia: General | Laterality: Left

## 2022-06-16 ENCOUNTER — Other Ambulatory Visit: Payer: BC Managed Care – PPO | Admitting: *Deleted

## 2022-06-16 DIAGNOSIS — R609 Edema, unspecified: Secondary | ICD-10-CM

## 2022-06-17 DIAGNOSIS — Z713 Dietary counseling and surveillance: Secondary | ICD-10-CM | POA: Diagnosis not present

## 2022-06-17 LAB — COMPREHENSIVE METABOLIC PANEL
ALT: 15 IU/L (ref 0–32)
AST: 18 IU/L (ref 0–40)
Albumin/Globulin Ratio: 1.8 (ref 1.2–2.2)
Albumin: 4.4 g/dL (ref 3.8–4.9)
Alkaline Phosphatase: 98 IU/L (ref 44–121)
BUN/Creatinine Ratio: 19 (ref 9–23)
BUN: 17 mg/dL (ref 6–24)
Bilirubin Total: 0.2 mg/dL (ref 0.0–1.2)
CO2: 24 mmol/L (ref 20–29)
Calcium: 9.8 mg/dL (ref 8.7–10.2)
Chloride: 101 mmol/L (ref 96–106)
Creatinine, Ser: 0.88 mg/dL (ref 0.57–1.00)
Globulin, Total: 2.5 g/dL (ref 1.5–4.5)
Glucose: 105 mg/dL — ABNORMAL HIGH (ref 70–99)
Potassium: 3.9 mmol/L (ref 3.5–5.2)
Sodium: 139 mmol/L (ref 134–144)
Total Protein: 6.9 g/dL (ref 6.0–8.5)
eGFR: 79 mL/min/{1.73_m2} (ref >59)

## 2022-06-17 LAB — TSH: TSH: 2.02 u[IU]/mL (ref 0.450–4.500)

## 2022-06-17 LAB — PRO B NATRIURETIC PEPTIDE: NT-Pro BNP: 105 pg/mL (ref 0–249)

## 2022-06-22 ENCOUNTER — Ambulatory Visit (HOSPITAL_COMMUNITY): Payer: BC Managed Care – PPO | Attending: Cardiovascular Disease

## 2022-06-22 DIAGNOSIS — R609 Edema, unspecified: Secondary | ICD-10-CM | POA: Diagnosis not present

## 2022-06-22 DIAGNOSIS — I480 Paroxysmal atrial fibrillation: Secondary | ICD-10-CM | POA: Diagnosis not present

## 2022-06-23 LAB — ECHOCARDIOGRAM COMPLETE
AR max vel: 2.2 cm2
AV Area VTI: 2.25 cm2
AV Area mean vel: 2.19 cm2
AV Mean grad: 8.3 mmHg
AV Peak grad: 14.9 mmHg
Ao pk vel: 1.93 m/s
Area-P 1/2: 4.88 cm2
S' Lateral: 1.6 cm

## 2022-07-01 DIAGNOSIS — Z713 Dietary counseling and surveillance: Secondary | ICD-10-CM | POA: Diagnosis not present

## 2022-07-02 ENCOUNTER — Telehealth: Payer: Self-pay | Admitting: Internal Medicine

## 2022-07-02 NOTE — Telephone Encounter (Signed)
Would need an appt to reassess

## 2022-07-02 NOTE — Telephone Encounter (Signed)
Pt seen 05/18/22 by Dr Martinique and treated for cough. Pt requests a prescription for cough medicine to assist with continued cough.  [id number] [place]:  [phone] Fax:  [phone]

## 2022-07-05 NOTE — Telephone Encounter (Signed)
Last Ov 05/18/22 w/ Dr. Martinique. Vv 03/04/22 w/ Dr. Regis Bill  Please advise

## 2022-07-05 NOTE — Telephone Encounter (Addendum)
Pt is at [facility] and will not be back in town until Thursday. Pt is having wheezing. Please advise . Pt would like hydrocodone cough med and will give the pharm info when they call her back

## 2022-07-05 NOTE — Telephone Encounter (Signed)
LVM instructions for pt to call office to schedule appt with any in office provider. Pt can do virtual, but would be better to do in person as to evaluating her lungs.

## 2022-07-07 NOTE — Telephone Encounter (Signed)
She can make a virtual visit with me if that would help I did not see her originally .  So cannot assess what would be helpful.

## 2022-07-08 NOTE — Telephone Encounter (Signed)
Spoke with patient and she stated she has Ov scheduled with Dr. Martinique 07/09/22.

## 2022-07-09 ENCOUNTER — Encounter: Payer: Self-pay | Admitting: Adult Health

## 2022-07-09 ENCOUNTER — Telehealth (INDEPENDENT_AMBULATORY_CARE_PROVIDER_SITE_OTHER): Payer: BC Managed Care – PPO | Admitting: Adult Health

## 2022-07-09 VITALS — Ht 60.0 in | Wt 182.0 lb

## 2022-07-09 DIAGNOSIS — J40 Bronchitis, not specified as acute or chronic: Secondary | ICD-10-CM

## 2022-07-09 DIAGNOSIS — B309 Viral conjunctivitis, unspecified: Secondary | ICD-10-CM | POA: Diagnosis not present

## 2022-07-09 MED ORDER — PREDNISONE 10 MG PO TABS: ORAL_TABLET | ORAL | 0 refills | Status: DC

## 2022-07-09 MED ORDER — HYDROCODONE BIT-HOMATROP MBR 5-1.5 MG/5ML PO SOLN: 5.0000 mL | Freq: Three times a day (TID) | ORAL | 0 refills | Status: DC | PRN

## 2022-07-09 MED ORDER — AZITHROMYCIN 250 MG PO TABS: ORAL_TABLET | ORAL | 0 refills | Status: AC

## 2022-07-09 NOTE — Progress Notes (Signed)
Virtual Visit via Video Note  I connected with Gail [name]  on 07/09/22 at  4:15 PM EDT by a video enabled telemedicine application and verified that I am speaking with the correct person using two identifiers.  Location patient: home Location provider:work or home office Persons participating in the virtual visit: patient, provider  I discussed the limitations of evaluation and management by telemedicine and the availability of in person appointments. The patient expressed understanding and agreed to proceed.   HPI: 56 year old female who  has a past medical history of Anticoagulant long-term use, Anxiety, Chronic pain of both shoulders, Dysrhythmia, Family history of adverse reaction to anesthesia, Heart murmur, History of anal fissures, History of infertility, female, History of kidney stones, Hyperlipidemia, Hypertension, IBS (irritable bowel syndrome), Left ureteral stone, OA (osteoarthritis), PAF (paroxysmal atrial fibrillation) (HCC), PCO (polycystic ovaries), PONV (postoperative nausea and vomiting), RLS (restless legs syndrome), Rosacea, and Type 2 diabetes mellitus ([place]).  She works at a daycare and 10 days ago she developed wheezing and she exhale with a dry cough.  She has had this in the past most recently a month ago.  She has been using her inhaler and her over-the-counter Robitussin which helps but for short period of time.  Often wheezing is worse at night.  She denies any fevers or chills.  Additionally, couple days ago she developed itchiness in her left eye, when she woke up yesterday the eyelid was matted shut when she woke up this morning it was matted shut with clear drainage.  Denies eye pain   ROS: See pertinent positives and negatives per HPI.  Past Medical History:  Diagnosis Date   Anticoagulant long-term use    xarelto--- managed by dr g. taylor   Anxiety    Chronic pain of both shoulders    Dysrhythmia    Family history of adverse reaction to anesthesia     mother--- ponv   Heart murmur    History of anal fissures    History of infertility, female    History of kidney stones    Hyperlipidemia    Hypertension    IBS (irritable bowel syndrome)    Left ureteral stone    OA (osteoarthritis)    PAF (paroxysmal atrial fibrillation) (HCC)    PCO (polycystic ovaries)    PONV (postoperative nausea and vomiting)    RLS (restless legs syndrome)    Rosacea    Type 2 diabetes mellitus ([place])     Past Surgical History:  Procedure Laterality Date   A-FLUTTER ABLATION N/A 07/16/2019   Procedure: A-FLUTTER ABLATION;  Surgeon: Evans Lance, MD;  Location: [facility];  Service: Cardiovascular;  Laterality: N/A;   ATRIAL TACH ABLATION N/A 07/16/2019   Procedure: ATRIAL TACH ABLATION;  Surgeon: Evans Lance, MD;  Location: [facility];  Service: Cardiovascular;  Laterality: N/A;   CARPAL TUNNEL RELEASE Bilateral 2006   HAND TENDON SURGERY Bilateral    bilateral thumb's   INTRAUTERINE DEVICE (IUD) INSERTION     KNEE ARTHROSCOPY Left 07/08/2010   '@[facility]'$  by dr graves   SHOULDER ARTHROSCOPY WITH ROTATOR CUFF REPAIR AND OPEN BICEPS TENODESIS Left 12/21/2021   '@[facility]'$    WISDOM TOOTH EXTRACTION      Family History  Problem Relation Age of Onset   Hypertension Mother    Heart disease Mother    Hypertension Father    ALS Father        deceased   Dementia Maternal Grandfather  Heart disease Paternal Grandmother    Dementia Paternal Grandfather    Hypertension Other    Lung cancer Other        uncle   Breast cancer Other        great aunt   Colon cancer Neg Hx    Stomach cancer Neg Hx    Liver cancer Neg Hx    Rectal cancer Neg Hx    Esophageal cancer Neg Hx        Current Outpatient Medications:    albuterol (VENTOLIN HFA) 108 (90 Base) MCG/ACT inhaler, Inhale 2 puffs into the lungs every 6 (six) hours as needed for wheezing or shortness of breath., Disp: 8 g, Rfl: 0   amoxicillin-clavulanate (AUGMENTIN) 875-125 MG  tablet, Take 1 tablet by mouth 2 (two) times daily., Disp: , Rfl:    azithromycin (ZITHROMAX) 250 MG tablet, Take 2 tablets on day 1, then 1 tablet daily on days 2 through 5, Disp: 6 tablet, Rfl: 0   diltiazem (CARDIZEM CD) 360 MG 24 hr capsule, TAKE 1 CAPSULE(360 MG) BY MOUTH DAILY, Disp: 90 capsule, Rfl: 3   fluticasone (FLONASE) 50 MCG/ACT nasal spray, 2 sprays per nostril daily.  MUST HAVE OFFICE VISIT FOR FURTHER REFILLS. (Patient taking differently: 2 sprays at bedtime. 2 sprays per nostril daily.  MUST HAVE OFFICE VISIT FOR FURTHER REFILLS.), Disp: 16 g, Rfl: 0   furosemide (LASIX) 40 MG tablet, Take 1 tablet (40 mg total) by mouth daily., Disp: 90 tablet, Rfl: 0   HYDROcodone bit-homatropine (HYCODAN) 5-1.5 MG/5ML syrup, Take 5 mLs by mouth every 8 (eight) hours as needed for cough., Disp: 120 mL, Rfl: 0   Lactobacillus (ACIDOPHILUS PO), Take 1 capsule by mouth at bedtime., Disp: , Rfl:    levonorgestrel (MIRENA, 52 MG,) 20 MCG/24HR IUD, 1 each by Intrauterine route once., Disp: , Rfl:    losartan (COZAAR) 50 MG tablet, TAKE 1 TABLET(50 MG) BY MOUTH DAILY, Disp: 90 tablet, Rfl: 3   metFORMIN (GLUCOPHAGE-XR) 500 MG 24 hr tablet, Take 1 tablet (500 mg total) by mouth daily with breakfast., Disp: 90 tablet, Rfl: 3   metroNIDAZOLE (METROGEL) 0.75 % gel, Apply 1 application topically 2 (two) times daily. For rosacea (Patient taking differently: Apply 1 application  topically 2 (two) times daily as needed (rosacea).), Disp: 45 g, Rfl: 1   NON FORMULARY, Pt uses a cpap nightly, Disp: , Rfl:    nystatin ointment (MYCOSTATIN), APPLY TOPICALLY AS DIRECTED (Patient taking differently: Apply 1 Application topically daily.), Disp: 30 g, Rfl: 0   oxyCODONE (OXY IR/ROXICODONE) 5 MG immediate release tablet, Take 2.5-5 mg by mouth every 6 (six) hours as needed for moderate pain., Disp: , Rfl:    polyethylene glycol powder (GLYCOLAX/MIRALAX) 17 GM/SCOOP powder, Take 17 g by mouth daily as needed  (constipation)., Disp: , Rfl:    potassium chloride (KLOR-CON) 10 MEQ tablet, Take 1 tablet (10 mEq total) by mouth daily., Disp: 90 tablet, Rfl: 0   predniSONE (DELTASONE) 10 MG tablet, 40 mg x 3 days, 20 mg x 3 days, 10 mg x 3 days, Disp: 21 tablet, Rfl: 0   RESTASIS 0.05 % ophthalmic emulsion, Place 1 drop into both eyes in the morning., Disp: , Rfl:    rivaroxaban (XARELTO) 20 MG TABS tablet, Take 1 tablet (20 mg total) by mouth daily with supper., Disp: 30 tablet, Rfl: 6   tamsulosin (FLOMAX) 0.4 MG CAPS capsule, TAKE 1 CAPSULE(0.4 MG) BY MOUTH DAILY, Disp: 90 capsule, Rfl: 0  vitamin B-12 (CYANOCOBALAMIN) 1000 MCG tablet, Take 1,000 mcg by mouth daily., Disp: , Rfl:    Zinc Oxide 30.6 % CREA, Apply 1 application topically every morning., Disp: , Rfl:   EXAM:  VITALS per patient if applicable:  GENERAL: alert, oriented, appears well and in no acute distress  HEENT: atraumatic, conjunttiva clear, no obvious abnormalities on inspection of external nose and ears  NECK: normal movements of the head and neck  LUNGS: on inspection no signs of respiratory distress, breathing rate appears normal, no obvious gross SOB. Has expiratory wheezing and dry bronchial cough.   CV: no obvious cyanosis  MS: moves all visible extremities without noticeable abnormality  PSYCH/NEURO: pleasant and cooperative, no obvious depression or anxiety, speech and thought processing grossly intact  ASSESSMENT AND PLAN:  1. Bronchitis  - predniSONE (DELTASONE) 10 MG tablet; 40 mg x 3 days, 20 mg x 3 days, 10 mg x 3 days  Dispense: 21 tablet; Refill: 0 - azithromycin (ZITHROMAX) 250 MG tablet; Take 2 tablets on day 1, then 1 tablet daily on days 2 through 5  Dispense: 6 tablet; Refill: 0 - HYDROcodone bit-homatropine (HYCODAN) 5-1.5 MG/5ML syrup; Take 5 mLs by mouth every 8 (eight) hours as needed for cough.  Dispense: 120 mL; Refill: 0  2. Acute viral conjunctivitis of left eye - conservative measures such  as cold compress - Follow up as needed  Dorothyann Peng, NP    Discussed the following assessment and plan:  I discussed the assessment and treatment plan with the patient. The patient was provided an opportunity to ask questions and all were answered. The patient agreed with the plan and demonstrated an understanding of the instructions.   The patient was advised to call back or seek an in-person evaluation if the symptoms worsen or if the condition fails to improve as anticipated.   Dorothyann Peng, NP

## 2022-07-12 ENCOUNTER — Ambulatory Visit: Payer: BC Managed Care – PPO | Admitting: Family Medicine

## 2022-07-15 DIAGNOSIS — Z4889 Encounter for other specified surgical aftercare: Secondary | ICD-10-CM | POA: Diagnosis not present

## 2022-07-15 DIAGNOSIS — M75102 Unspecified rotator cuff tear or rupture of left shoulder, not specified as traumatic: Secondary | ICD-10-CM | POA: Diagnosis not present

## 2022-07-26 DIAGNOSIS — C4401 Basal cell carcinoma of skin of lip: Secondary | ICD-10-CM | POA: Diagnosis not present

## 2022-07-29 DIAGNOSIS — Z713 Dietary counseling and surveillance: Secondary | ICD-10-CM | POA: Diagnosis not present

## 2022-08-27 DIAGNOSIS — Z713 Dietary counseling and surveillance: Secondary | ICD-10-CM | POA: Diagnosis not present

## 2022-09-03 ENCOUNTER — Other Ambulatory Visit: Payer: Self-pay | Admitting: Obstetrics and Gynecology

## 2022-09-03 DIAGNOSIS — Z1231 Encounter for screening mammogram for malignant neoplasm of breast: Secondary | ICD-10-CM

## 2022-09-05 ENCOUNTER — Other Ambulatory Visit: Payer: Self-pay | Admitting: Internal Medicine

## 2022-09-11 DIAGNOSIS — N200 Calculus of kidney: Secondary | ICD-10-CM | POA: Diagnosis not present

## 2022-09-16 ENCOUNTER — Encounter: Payer: Self-pay | Admitting: Family Medicine

## 2022-09-16 ENCOUNTER — Telehealth (INDEPENDENT_AMBULATORY_CARE_PROVIDER_SITE_OTHER): Payer: BC Managed Care – PPO | Admitting: Family Medicine

## 2022-09-16 VITALS — Wt 183.0 lb

## 2022-09-16 DIAGNOSIS — J989 Respiratory disorder, unspecified: Secondary | ICD-10-CM

## 2022-09-16 DIAGNOSIS — H5789 Other specified disorders of eye and adnexa: Secondary | ICD-10-CM

## 2022-09-16 MED ORDER — ALBUTEROL SULFATE HFA 108 (90 BASE) MCG/ACT IN AERS: 2.0000 | INHALATION_SPRAY | Freq: Four times a day (QID) | RESPIRATORY_TRACT | 0 refills | Status: DC | PRN

## 2022-09-16 MED ORDER — ERYTHROMYCIN 5 MG/GM OP OINT: 1.0000 | TOPICAL_OINTMENT | Freq: Every day | OPHTHALMIC | 0 refills | Status: DC

## 2022-09-16 NOTE — Progress Notes (Signed)
Virtual Visit via Video Note  I connected with Gail [name]  on 09/16/22 at  1:20 PM EDT by a video enabled telemedicine application and verified that I am speaking with the correct person using two identifiers.  Location patient: [place] Location provider:work or home office Persons participating in the virtual visit: patient, provider  I discussed the limitations and requested verbal permission for telemedicine visit. The patient expressed understanding and agreed to proceed.   HPI:  Acute telemedicine visit for "pink eye": -Onset: yesterday -was exposed to a child with pink eye last week -Symptoms include: irritation L eye, "goop" in the L eye this morning, redness L eye, some matting of lashes -a few nights ago when husband did plug in and she was playing with dogs she had some wheezing and had a cough - resolved with inhaler and doing fine since. Requests refill on albuterol as thinks may be almost out of hers from several resp infections this year.  -Denies:CP, SOB, fever, vision loss, pain in the eye -Pertinent past medical history: seasonal allergies - sees eye doctor for issues with eye symptoms with allergies, see below, wheezing with upper resp symptoms (had to have prednisone and azithromycin over the summer) -Pertinent medication allergies: Allergies  Allergen Reactions   Ace Inhibitors Cough   Erythromycin Ethylsuccinate     Stomach hurts REACTION: unspecified   can take azithromycin   Doxycycline Rash   Latex Rash  -COVID-19 vaccine status:  Immunization History  Administered Date(s) Administered   Influenza Split 09/13/2011, 09/12/2012, 09/12/2013, 09/23/2017   Influenza,inj,Quad PF,6+ Mos 09/20/2016, 10/04/2018, 09/13/2019, 09/26/2020   Influenza-Unspecified 09/12/2014, 10/03/2018, 09/12/2019   PFIZER(Purple Top)SARS-COV-2 Vaccination 04/17/2020, 05/08/2020   PPD Test 08/08/2018   Td 12/13/1996, 11/25/2009     ROS: See pertinent positives and negatives per HPI.  Past  Medical History:  Diagnosis Date   Anticoagulant long-term use    xarelto--- managed by dr g. taylor   Anxiety    Chronic pain of both shoulders    Dysrhythmia    Family history of adverse reaction to anesthesia    mother--- ponv   Heart murmur    History of anal fissures    History of infertility, female    History of kidney stones    Hyperlipidemia    Hypertension    IBS (irritable bowel syndrome)    Left ureteral stone    OA (osteoarthritis)    PAF (paroxysmal atrial fibrillation) (HCC)    PCO (polycystic ovaries)    PONV (postoperative nausea and vomiting)    RLS (restless legs syndrome)    Rosacea    Type 2 diabetes mellitus ([place])     Past Surgical History:  Procedure Laterality Date   A-FLUTTER ABLATION N/A 07/16/2019   Procedure: A-FLUTTER ABLATION;  Surgeon: Evans Lance, MD;  Location: [facility];  Service: Cardiovascular;  Laterality: N/A;   ATRIAL TACH ABLATION N/A 07/16/2019   Procedure: ATRIAL TACH ABLATION;  Surgeon: Evans Lance, MD;  Location: [facility];  Service: Cardiovascular;  Laterality: N/A;   CARPAL TUNNEL RELEASE Bilateral 2006   HAND TENDON SURGERY Bilateral    bilateral thumb's   INTRAUTERINE DEVICE (IUD) INSERTION     KNEE ARTHROSCOPY Left 07/08/2010   '@[facility]'$  by dr graves   SHOULDER ARTHROSCOPY WITH ROTATOR CUFF REPAIR AND OPEN BICEPS TENODESIS Left 12/21/2021   '@[facility]'$    WISDOM TOOTH EXTRACTION       Current Outpatient Medications:    albuterol (VENTOLIN HFA) 108 (90 Base)  MCG/ACT inhaler, Inhale 2 puffs into the lungs every 6 (six) hours as needed for wheezing or shortness of breath., Disp: 8 g, Rfl: 0   diltiazem (CARDIZEM CD) 360 MG 24 hr capsule, TAKE 1 CAPSULE(360 MG) BY MOUTH DAILY, Disp: 90 capsule, Rfl: 3   fluticasone (FLONASE) 50 MCG/ACT nasal spray, 2 sprays per nostril daily.  MUST HAVE OFFICE VISIT FOR FURTHER REFILLS. (Patient taking differently: 2 sprays at bedtime. 2 sprays per nostril daily.  MUST HAVE  OFFICE VISIT FOR FURTHER REFILLS.), Disp: 16 g, Rfl: 0   furosemide (LASIX) 40 MG tablet, TAKE 1 TABLET(40 MG) BY MOUTH DAILY, Disp: 90 tablet, Rfl: 2   Lactobacillus (ACIDOPHILUS PO), Take 1 capsule by mouth at bedtime., Disp: , Rfl:    levonorgestrel (MIRENA, 52 MG,) 20 MCG/24HR IUD, 1 each by Intrauterine route once., Disp: , Rfl:    losartan (COZAAR) 50 MG tablet, TAKE 1 TABLET(50 MG) BY MOUTH DAILY, Disp: 90 tablet, Rfl: 3   metFORMIN (GLUCOPHAGE-XR) 500 MG 24 hr tablet, Take 1 tablet (500 mg total) by mouth daily with breakfast., Disp: 90 tablet, Rfl: 3   metroNIDAZOLE (METROGEL) 0.75 % gel, Apply 1 application topically 2 (two) times daily. For rosacea (Patient taking differently: Apply 1 application  topically 2 (two) times daily as needed (rosacea).), Disp: 45 g, Rfl: 1   NON FORMULARY, Pt uses a cpap nightly, Disp: , Rfl:    nystatin ointment (MYCOSTATIN), APPLY TOPICALLY AS DIRECTED (Patient taking differently: Apply 1 Application topically daily.), Disp: 30 g, Rfl: 0   potassium chloride (KLOR-CON) 10 MEQ tablet, TAKE 1 TABLET(10 MEQ) BY MOUTH DAILY, Disp: 90 tablet, Rfl: 2   RESTASIS 0.05 % ophthalmic emulsion, Place 1 drop into both eyes in the morning., Disp: , Rfl:    rivaroxaban (XARELTO) 20 MG TABS tablet, Take 1 tablet (20 mg total) by mouth daily with supper., Disp: 30 tablet, Rfl: 6   vitamin B-12 (CYANOCOBALAMIN) 1000 MCG tablet, Take 1,000 mcg by mouth daily., Disp: , Rfl:    Zinc Oxide 30.6 % CREA, Apply 1 application topically every morning., Disp: , Rfl:   EXAM:  VITALS per patient if applicable:  GENERAL: alert, oriented, appears well and in no acute distress  HEENT: atraumatic, conjunttiva clear, no obvious abnormalities on inspection of external nose and ears  NECK: normal movements of the head and neck  LUNGS: on inspection no signs of respiratory distress, breathing rate appears normal, no obvious gross SOB, gasping or wheezing  CV: no obvious cyanosis  MS:  moves all visible extremities without noticeable abnormality  PSYCH/NEURO: pleasant and cooperative, no obvious depression or anxiety, speech and thought processing grossly intact  ASSESSMENT AND PLAN:  Discussed the following assessment and plan:  Eye irritation  -we discussed possible serious and likely etiologies, options for evaluation and workup, limitations of telemedicine visit vs in person visit, treatment, treatment risks and precautions. Pt is agreeable to treatment via telemedicine at this moment. Query conjunctivitis vs other. Discussed could be viral or bacterial or allergy related. 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 Discussed options. She has erythromycin listed on intolerance list - not an allergy and she reports cna take azithromycin. She opted for erythro optho oint. Advised if any signs of allergy to stop immediately. Refilled alb per her request.  Work/School slipped offered: provided in patient instructions  Advised to seek prompt virtual visit or in person care if worsening, new symptoms arise, or if is not  improving with treatment as expected per our conversation of expected course. Discussed options for follow up care. Did let this patient know that I do telemedicine on Tuesdays and Thursdays for [facility] and those are the days I am logged into the system. Advised to schedule follow up visit with PCP, [facility] virtual visits or [facility] if any further questions or concerns to avoid delays in care.   I discussed the assessment and treatment plan with the patient. The patient was provided an opportunity to ask questions and all were answered. The patient agreed with the plan and demonstrated an understanding of the instructions.     Lucretia Kern, DO

## 2022-09-16 NOTE — Patient Instructions (Signed)
   ---------------------------------------------------------------------------------------------------------------------------      WORK SLIP:  Patient Gail [name],  09-30-1966, was seen for a medical visit today, 09/16/22 . Please excuse from work for illness. Ok to return if negative covid test, on antibiotic for 24 hours and symptoms are improving.   Sincerely: E-signature: Dr. Colin Benton, DO [place] Ph: [phone]   ------------------------------------------------------------------------------------------------------------------------------  -I sent the medication(s) we discussed to your pharmacy: Meds ordered this encounter  Medications   albuterol (VENTOLIN HFA) 108 (90 Base) MCG/ACT inhaler    Sig: Inhale 2 puffs into the lungs every 6 (six) hours as needed for wheezing or shortness of breath.    Dispense:  8 g    Refill:  0   erythromycin ophthalmic ointment    Sig: Place 1 Application into the left eye at bedtime.    Dispense:  3.5 g    Refill:  0     I hope you are feeling better soon!  Seek in person care promptly if your symptoms worsen, new concerns arise or you are not improving with treatment.  It was nice to meet you today. I help [facility] out with telemedicine visits on Tuesdays and Thursdays and am happy to help if you need a virtual follow up visit on those days. Otherwise, if you have any concerns or questions following this visit please schedule a follow up visit with your Primary Care office or seek care at a local urgent care clinic to avoid delays in care. If you are having severe or life threatening symptoms please call 911 and/or go to the nearest emergency room.

## 2022-09-23 DIAGNOSIS — Z713 Dietary counseling and surveillance: Secondary | ICD-10-CM | POA: Diagnosis not present

## 2022-09-29 DIAGNOSIS — Z1231 Encounter for screening mammogram for malignant neoplasm of breast: Secondary | ICD-10-CM

## 2022-10-06 DIAGNOSIS — N201 Calculus of ureter: Secondary | ICD-10-CM | POA: Diagnosis not present

## 2022-10-06 DIAGNOSIS — R82994 Hypercalciuria: Secondary | ICD-10-CM | POA: Diagnosis not present

## 2022-10-08 ENCOUNTER — Telehealth: Payer: Self-pay | Admitting: Internal Medicine

## 2022-10-08 NOTE — Telephone Encounter (Signed)
Pt's urologist told patient she may need to start getting bone density scans. Urologist said patient's calcium was high. Asking do you think that is something she needs.

## 2022-10-11 NOTE — Telephone Encounter (Signed)
Not sure what is being asked  . Maybe they are talking about an endocrine problem that causes kidney stones such as hyperparathyroid?   That can effect bones?and thus a baseline dexa scan?     Would discuss  at  appt to discuss in person or   virtual

## 2022-10-13 NOTE — Telephone Encounter (Signed)
Contact patient. Left voicemail for patient to call us back.

## 2022-10-21 NOTE — Telephone Encounter (Signed)
Attempted to contact earlier today. Left a voicemail to call us back.

## 2022-10-25 ENCOUNTER — Other Ambulatory Visit: Payer: Self-pay | Admitting: Internal Medicine

## 2022-10-26 DIAGNOSIS — M25562 Pain in left knee: Secondary | ICD-10-CM | POA: Diagnosis not present

## 2022-10-26 DIAGNOSIS — M1712 Unilateral primary osteoarthritis, left knee: Secondary | ICD-10-CM | POA: Diagnosis not present

## 2022-10-28 DIAGNOSIS — Z713 Dietary counseling and surveillance: Secondary | ICD-10-CM | POA: Diagnosis not present

## 2022-11-17 ENCOUNTER — Other Ambulatory Visit: Payer: Self-pay

## 2022-11-17 DIAGNOSIS — J989 Respiratory disorder, unspecified: Secondary | ICD-10-CM

## 2022-11-17 MED ORDER — ALBUTEROL SULFATE HFA 108 (90 BASE) MCG/ACT IN AERS: 2.0000 | INHALATION_SPRAY | Freq: Four times a day (QID) | RESPIRATORY_TRACT | 2 refills | Status: DC | PRN

## 2022-11-22 ENCOUNTER — Ambulatory Visit: Payer: BC Managed Care – PPO

## 2022-11-25 DIAGNOSIS — L905 Scar conditions and fibrosis of skin: Secondary | ICD-10-CM | POA: Diagnosis not present

## 2022-11-25 DIAGNOSIS — L570 Actinic keratosis: Secondary | ICD-10-CM | POA: Diagnosis not present

## 2022-11-25 DIAGNOSIS — Z85828 Personal history of other malignant neoplasm of skin: Secondary | ICD-10-CM | POA: Diagnosis not present

## 2022-11-25 DIAGNOSIS — L578 Other skin changes due to chronic exposure to nonionizing radiation: Secondary | ICD-10-CM | POA: Diagnosis not present

## 2022-12-03 DIAGNOSIS — Z713 Dietary counseling and surveillance: Secondary | ICD-10-CM | POA: Diagnosis not present

## 2022-12-08 ENCOUNTER — Ambulatory Visit
Admission: RE | Admit: 2022-12-08 | Discharge: 2022-12-08 | Disposition: A | Discharge status: Home / Self Care | Payer: BC Managed Care – PPO | Source: Ambulatory Visit | Attending: Obstetrics and Gynecology | Admitting: Obstetrics and Gynecology

## 2022-12-08 DIAGNOSIS — Z1231 Encounter for screening mammogram for malignant neoplasm of breast: Secondary | ICD-10-CM

## 2022-12-14 DIAGNOSIS — B9689 Other specified bacterial agents as the cause of diseases classified elsewhere: Secondary | ICD-10-CM | POA: Diagnosis not present

## 2022-12-14 DIAGNOSIS — R8271 Bacteriuria: Secondary | ICD-10-CM | POA: Diagnosis not present

## 2022-12-14 DIAGNOSIS — R1084 Generalized abdominal pain: Secondary | ICD-10-CM | POA: Diagnosis not present

## 2022-12-14 DIAGNOSIS — R3121 Asymptomatic microscopic hematuria: Secondary | ICD-10-CM | POA: Diagnosis not present

## 2022-12-20 ENCOUNTER — Telehealth: Payer: Self-pay | Admitting: Internal Medicine

## 2022-12-20 NOTE — Telephone Encounter (Signed)
Asking

## 2022-12-20 NOTE — Telephone Encounter (Signed)
error 

## 2022-12-21 ENCOUNTER — Ambulatory Visit: Payer: BC Managed Care – PPO | Admitting: Family Medicine

## 2022-12-27 ENCOUNTER — Ambulatory Visit: Payer: BC Managed Care – PPO | Admitting: Family

## 2022-12-27 ENCOUNTER — Encounter: Payer: Self-pay | Admitting: Family

## 2022-12-27 VITALS — BP 112/62 | HR 90 | Temp 98.1°F | Wt 185.0 lb

## 2022-12-27 DIAGNOSIS — R7309 Other abnormal glucose: Secondary | ICD-10-CM

## 2022-12-27 DIAGNOSIS — R7989 Other specified abnormal findings of blood chemistry: Secondary | ICD-10-CM

## 2022-12-27 DIAGNOSIS — E785 Hyperlipidemia, unspecified: Secondary | ICD-10-CM | POA: Diagnosis not present

## 2022-12-27 DIAGNOSIS — I10 Essential (primary) hypertension: Secondary | ICD-10-CM | POA: Diagnosis not present

## 2022-12-27 LAB — CBC WITH DIFFERENTIAL/PLATELET
Basophils Absolute: 0 10*3/uL (ref 0.0–0.1)
Basophils Relative: 1.1 % (ref 0.0–3.0)
Eosinophils Absolute: 0.1 10*3/uL (ref 0.0–0.7)
Eosinophils Relative: 3.1 % (ref 0.0–5.0)
HCT: 41.2 % (ref 36.0–46.0)
Hemoglobin: 13.8 g/dL (ref 12.0–15.0)
Lymphocytes Relative: 31 % (ref 12.0–46.0)
Lymphs Abs: 1.4 10*3/uL (ref 0.7–4.0)
MCHC: 33.5 g/dL (ref 30.0–36.0)
MCV: 93.2 fl (ref 78.0–100.0)
Monocytes Absolute: 0.4 10*3/uL (ref 0.1–1.0)
Monocytes Relative: 9.8 % (ref 3.0–12.0)
Neutro Abs: 2.5 10*3/uL (ref 1.4–7.7)
Neutrophils Relative %: 55 % (ref 43.0–77.0)
Platelets: 221 10*3/uL (ref 150.0–400.0)
RBC: 4.42 Mil/uL (ref 3.87–5.11)
RDW: 13.2 % (ref 11.5–15.5)
WBC: 4.6 10*3/uL (ref 4.0–10.5)

## 2022-12-27 LAB — LIPID PANEL
Cholesterol: 165 mg/dL (ref 0–200)
HDL: 51 mg/dL (ref >39.00)
LDL Cholesterol: 105 mg/dL — ABNORMAL HIGH (ref 0–99)
NonHDL: 114.36
Total CHOL/HDL Ratio: 3
Triglycerides: 48 mg/dL (ref 0.0–149.0)
VLDL: 9.6 mg/dL (ref 0.0–40.0)

## 2022-12-27 LAB — VITAMIN B12: Vitamin B-12: 1375 pg/mL — ABNORMAL HIGH (ref 211–911)

## 2022-12-27 LAB — COMPREHENSIVE METABOLIC PANEL
ALT: 15 U/L (ref 0–35)
AST: 17 U/L (ref 0–37)
Albumin: 4.3 g/dL (ref 3.5–5.2)
Alkaline Phosphatase: 69 U/L (ref 39–117)
BUN: 13 mg/dL (ref 6–23)
CO2: 26 mEq/L (ref 19–32)
Calcium: 9 mg/dL (ref 8.4–10.5)
Chloride: 106 mEq/L (ref 96–112)
Creatinine, Ser: 0.74 mg/dL (ref 0.40–1.20)
GFR: 91.79 mL/min (ref >60.00)
Glucose, Bld: 103 mg/dL — ABNORMAL HIGH (ref 70–99)
Potassium: 4 mEq/L (ref 3.5–5.1)
Sodium: 140 mEq/L (ref 135–145)
Total Bilirubin: 0.4 mg/dL (ref 0.2–1.2)
Total Protein: 6.9 g/dL (ref 6.0–8.3)

## 2022-12-27 LAB — POCT GLYCOSYLATED HEMOGLOBIN (HGB A1C): Hemoglobin A1C: 5.6 % (ref 4.0–5.6)

## 2022-12-27 MED ORDER — NYSTATIN 100000 UNIT/GM EX OINT: 1.0000 | TOPICAL_OINTMENT | Freq: Every day | CUTANEOUS | 0 refills | Status: DC

## 2022-12-27 NOTE — Progress Notes (Signed)
Acute Office Visit  Subjective:     Patient ID: Gail [name], female    DOB: Jun 10, 1966, 57 y.o.   MRN: [id number]  No chief complaint on file.   HPI Patient is in today for a follow-up of PCOS, HTN, HLD, Obesity. She is doing well. Has had several health concerns in 2023 to include Basal cell carcinoma of the face,right knee pain that will require knee replacement, right shoulder pain, kidney stones, and UTI. She is seeing a specialist for all of these things. 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 She does not routinely exercise.   Review of Systems  Genitourinary:  Negative for dysuria and urgency.       Had a kidney stone and UTI-resolved  Musculoskeletal:  Positive for joint pain.       Right knee pain, right shoulder pain, paint in the joints of the fingers-seeing ortho  Psychiatric/Behavioral: Negative.    All other systems reviewed and are negative. Past Medical History:  Diagnosis Date  . Anticoagulant long-term use    xarelto--- managed by dr g. taylor  . Anxiety   . Chronic pain of both shoulders   . Dysrhythmia   . Family history of adverse reaction to anesthesia    mother--- ponv  . Heart murmur   . History of anal fissures   . History of infertility, female   . History of kidney stones   . Hyperlipidemia   . Hypertension   . IBS (irritable bowel syndrome)   . Left ureteral stone   . OA (osteoarthritis)   . PAF (paroxysmal atrial fibrillation) ([place])   . PCO (polycystic ovaries)   . PONV (postoperative nausea and vomiting)   . RLS (restless legs syndrome)   . Rosacea   . Type 2 diabetes mellitus ([place])     Social History   Socioeconomic History  . Marital status: Married    Spouse name: Not on file  . Number of children: 0  . Years of education: Not on file  . Highest education level: Not on file  Occupational History  . Occupation: Agricultural consultant: [employer]  Tobacco Use  . Smoking status: Former    Packs/day:  0.50    Years: 7.00    Total pack years: 3.50    Types: Cigarettes    Quit date: 1988    Years since quitting: 36.0  . Smokeless tobacco: Never  Vaping Use  . Vaping Use: Never used  Substance and Sexual Activity  . Alcohol use: No    Alcohol/week: 0.0 standard drinks of alcohol  . Drug use: No  . Sexual activity: Not on file    Comment: married  Other Topics Concern  . 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   Social Determinants of Health   Financial Resource Strain: Not on file  Food Insecurity: Not on file  Transportation Needs: Not on file  Physical Activity: Not on file  Stress: Not on file  Social Connections: Not on file  Intimate Partner Violence: Not on file    Past Surgical History:  Procedure Laterality Date  . A-FLUTTER ABLATION N/A 07/16/2019   Procedure: A-FLUTTER ABLATION;  Surgeon: [name]  W, MD;  Location: [facility];  Service: Cardiovascular;  Laterality: N/A;  . ATRIAL TACH ABLATION N/A 07/16/2019   Procedure: ATRIAL TACH ABLATION;  Surgeon: Evans Lance, MD;  Location: [facility];  Service: Cardiovascular;  Laterality: N/A;  . CARPAL TUNNEL RELEASE Bilateral 2006  . HAND TENDON SURGERY Bilateral    bilateral thumb's  . INTRAUTERINE DEVICE (IUD) INSERTION    . KNEE ARTHROSCOPY Left 07/08/2010   '@[facility]'$  by dr Berenice Primas  . SHOULDER ARTHROSCOPY WITH ROTATOR CUFF REPAIR AND OPEN BICEPS TENODESIS Left 12/21/2021   '@[facility]'$   . WISDOM TOOTH EXTRACTION      Family History  Problem Relation Age of Onset  . Hypertension Mother   . Heart disease Mother   . Hypertension Father   . ALS Father        deceased  . Dementia Maternal Grandfather   . Heart disease Paternal Grandmother   . Dementia Paternal Grandfather   . Hypertension Other   . Lung cancer Other         uncle  . Breast cancer Other        great aunt  . Colon cancer Neg Hx   . Stomach cancer Neg Hx   . Liver cancer Neg Hx   . Rectal cancer Neg Hx   . Esophageal cancer Neg Hx     Allergies  Allergen Reactions  . Ace Inhibitors Cough  . Erythromycin Ethylsuccinate     Stomach hurts REACTION: unspecified   can take azithromycin  . Doxycycline Rash  . Latex Rash    Current Outpatient Medications on File Prior to Visit  Medication Sig Dispense Refill  . albuterol (VENTOLIN HFA) 108 (90 Base) MCG/ACT inhaler Inhale 2 puffs into the lungs every 6 (six) hours as needed for wheezing or shortness of breath. 8 g 2  . diltiazem (CARDIZEM CD) 360 MG 24 hr capsule TAKE 1 CAPSULE(360 MG) BY MOUTH DAILY 90 capsule 3  . fluticasone (FLONASE) 50 MCG/ACT nasal spray 2 sprays per nostril daily.  MUST HAVE OFFICE VISIT FOR FURTHER REFILLS. (Patient taking differently: 2 sprays at bedtime. 2 sprays per nostril daily.  MUST HAVE OFFICE VISIT FOR FURTHER REFILLS.) 16 g 0  . furosemide (LASIX) 40 MG tablet TAKE 1 TABLET(40 MG) BY MOUTH DAILY 90 tablet 2  . Lactobacillus (ACIDOPHILUS PO) Take 1 capsule by mouth at bedtime.    Marland Kitchen levonorgestrel (MIRENA, 52 MG,) 20 MCG/24HR IUD 1 each by Intrauterine route once.    Marland Kitchen losartan (COZAAR) 50 MG tablet TAKE 1 TABLET(50 MG) BY MOUTH DAILY 90 tablet 3  . metFORMIN (GLUCOPHAGE-XR) 500 MG 24 hr tablet TAKE 1 TABLET(500 MG) BY MOUTH DAILY WITH BREAKFAST 90 tablet 0  . metroNIDAZOLE (METROGEL) 0.75 % gel Apply 1 application topically 2 (two) times daily. For rosacea (Patient taking differently: Apply 1 application  topically 2 (two) times daily as needed (rosacea).) 45 g 1  . NON FORMULARY Pt uses a cpap nightly    . potassium chloride (KLOR-CON) 10 MEQ tablet TAKE 1 TABLET(10 MEQ) BY MOUTH DAILY 90 tablet 2  . RESTASIS 0.05 % ophthalmic emulsion Place 1 drop into both eyes in the morning.    . rivaroxaban (XARELTO) 20 MG TABS tablet Take 1 tablet (20 mg total) by  mouth daily with supper. 30 tablet 6  . traMADol (ULTRAM) 50 MG tablet Take 50 mg by mouth every 12 (twelve) hours as needed.    . vitamin B-12 (CYANOCOBALAMIN) 1000 MCG tablet  Take 1,000 mcg by mouth daily.    . Zinc Oxide 30.6 % CREA Apply 1 application topically every morning.     No current facility-administered medications on file prior to visit.    BP 112/62 (BP Location: Right Arm, Patient Position: Sitting, Cuff Size: Large)   Pulse 90   Temp 98.1 F (36.7 C) (Oral)   Wt 185 lb (83.9 kg)   SpO2 97%   BMI 36.13 kg/m chart      Objective:    BP 112/62 (BP Location: Right Arm, Patient Position: Sitting, Cuff Size: Large)   Pulse 90   Temp 98.1 F (36.7 C) (Oral)   Wt 185 lb (83.9 kg)   SpO2 97%   BMI 36.13 kg/m    Physical Exam Vitals and nursing note reviewed.  Constitutional:      Appearance: Normal appearance. She is obese.  HENT:     Right Ear: Ear canal normal.     Left Ear: Tympanic membrane and ear canal normal.  Cardiovascular:     Rate and Rhythm: Normal rate and regular rhythm.     Pulses: Normal pulses.     Heart sounds: Normal heart sounds.  Pulmonary:     Effort: Pulmonary effort is normal.     Breath sounds: Normal breath sounds.  Abdominal:     General: Abdomen is flat. Bowel sounds are normal.     Palpations: Abdomen is soft.     Tenderness: There is no abdominal tenderness. There is no guarding or rebound.  Musculoskeletal:        General: No tenderness.     Cervical back: Normal range of motion and neck supple.     Right lower leg: No edema.     Left lower leg: No edema.  Skin:    General: Skin is warm and dry.  Neurological:     General: No focal deficit present.     Mental Status: She is alert and oriented to person, place, and time.     Motor: No weakness.     Gait: Gait normal.     Deep Tendon Reflexes: Reflexes normal.  Psychiatric:        Mood and Affect: Mood normal.        Behavior: Behavior normal.   Results for orders  placed or performed in visit on 12/27/22  POCT HgB A1C  Result Value Ref Range   Hemoglobin A1C 5.6 4.0 - 5.6 %   HbA1c POC (<> result, manual entry)     HbA1c, POC (prediabetic range)     HbA1c, POC (controlled diabetic range)          Assessment & Plan:   Problem List Items Addressed This Visit     Low normal vitamin B12 level   Relevant Orders   Vitamin B12   HYPERGLYCEMIA, BORDERLINE - Primary   Relevant Orders   POCT HgB A1C (Completed)   CMP   Essential hypertension   Relevant Orders   CBC with Differential/Platelets   CMP   Other Visit Diagnoses     Hyperlipidemia, unspecified hyperlipidemia type       Relevant Orders   Lipid panel       Meds ordered this encounter  Medications  . nystatin ointment (MYCOSTATIN)    Sig: Apply 1 Application topically daily.    Dispense:  30 g    Refill:  0    Return in about 6 months (around 06/27/2023) for CPX. Call the office with any questions or  concerns.Continue to follow-up with specialists as schedule. We will follow-up pending labs and sooner as needed.  Kennyth Arnold, FNP

## 2023-01-10 DIAGNOSIS — R3121 Asymptomatic microscopic hematuria: Secondary | ICD-10-CM | POA: Diagnosis not present

## 2023-01-10 DIAGNOSIS — R1084 Generalized abdominal pain: Secondary | ICD-10-CM | POA: Diagnosis not present

## 2023-01-13 DIAGNOSIS — Z713 Dietary counseling and surveillance: Secondary | ICD-10-CM | POA: Diagnosis not present

## 2023-01-21 ENCOUNTER — Telehealth: Payer: Self-pay | Admitting: Internal Medicine

## 2023-01-21 DIAGNOSIS — I10 Essential (primary) hypertension: Secondary | ICD-10-CM

## 2023-01-21 DIAGNOSIS — I480 Paroxysmal atrial fibrillation: Secondary | ICD-10-CM

## 2023-01-21 MED ORDER — DILTIAZEM HCL ER COATED BEADS 360 MG PO CP24: ORAL_CAPSULE | ORAL | 1 refills | Status: DC

## 2023-01-21 MED ORDER — LOSARTAN POTASSIUM 50 MG PO TABS: ORAL_TABLET | ORAL | 1 refills | Status: DC

## 2023-01-21 NOTE — Telephone Encounter (Signed)
*  STAT* If patient is at the pharmacy, call can be transferred to refill team.   1. Which medications need to be refilled? (please list name of each medication and dose if known) diltiazem (CARDIZEM CD) 360 MG 24 hr capsule   losartan (COZAAR) 50 MG tablet   2. Which pharmacy/location (including street and city if local pharmacy) is medication to be sent to? [facility] [id number] (INTERNAL USE ONLY) - [place], [place]   3. Do they need a 30 day or 90 day supply? 90   Patient has appt on 04/21/23

## 2023-01-21 NOTE — Telephone Encounter (Signed)
Pt's medications were sent to her pharmacy as requested. Confirmation received.

## 2023-01-24 ENCOUNTER — Other Ambulatory Visit: Payer: Self-pay | Admitting: Student

## 2023-01-24 DIAGNOSIS — I10 Essential (primary) hypertension: Secondary | ICD-10-CM

## 2023-01-24 DIAGNOSIS — I480 Paroxysmal atrial fibrillation: Secondary | ICD-10-CM

## 2023-01-25 DIAGNOSIS — M65312 Trigger thumb, left thumb: Secondary | ICD-10-CM | POA: Diagnosis not present

## 2023-01-25 DIAGNOSIS — M65341 Trigger finger, right ring finger: Secondary | ICD-10-CM | POA: Diagnosis not present

## 2023-01-31 ENCOUNTER — Other Ambulatory Visit: Payer: Self-pay | Admitting: Internal Medicine

## 2023-02-15 DIAGNOSIS — M25562 Pain in left knee: Secondary | ICD-10-CM | POA: Diagnosis not present

## 2023-02-15 DIAGNOSIS — M1712 Unilateral primary osteoarthritis, left knee: Secondary | ICD-10-CM | POA: Diagnosis not present

## 2023-02-15 DIAGNOSIS — M65331 Trigger finger, right middle finger: Secondary | ICD-10-CM | POA: Diagnosis not present

## 2023-02-16 DIAGNOSIS — Z713 Dietary counseling and surveillance: Secondary | ICD-10-CM | POA: Diagnosis not present

## 2023-02-17 DIAGNOSIS — Z6835 Body mass index (BMI) 35.0-35.9, adult: Secondary | ICD-10-CM | POA: Diagnosis not present

## 2023-02-17 DIAGNOSIS — J029 Acute pharyngitis, unspecified: Secondary | ICD-10-CM | POA: Diagnosis not present

## 2023-02-17 DIAGNOSIS — R030 Elevated blood-pressure reading, without diagnosis of hypertension: Secondary | ICD-10-CM | POA: Diagnosis not present

## 2023-02-17 DIAGNOSIS — J020 Streptococcal pharyngitis: Secondary | ICD-10-CM | POA: Diagnosis not present

## 2023-02-17 DIAGNOSIS — R051 Acute cough: Secondary | ICD-10-CM | POA: Diagnosis not present

## 2023-02-18 ENCOUNTER — Ambulatory Visit: Payer: BC Managed Care – PPO | Admitting: Family

## 2023-02-22 ENCOUNTER — Other Ambulatory Visit: Payer: Self-pay

## 2023-02-22 DIAGNOSIS — I480 Paroxysmal atrial fibrillation: Secondary | ICD-10-CM

## 2023-02-22 MED ORDER — RIVAROXABAN 20 MG PO TABS: 20.0000 mg | ORAL_TABLET | Freq: Every day | ORAL | 5 refills | Status: DC

## 2023-02-22 NOTE — Telephone Encounter (Signed)
Received faxed refill request for Xarelto '20mg'$  from [facility].  Pt last saw Dr Harrington Challenger 06/08/22, last labs 12/27/22 Creat 0.74, age 57, weight 83.9kg, CrCl 115.11, based on CrCl pt is on appropriate dosage of Xarelto '20mg'$  QD for afib.  Will refill rx.

## 2023-03-04 ENCOUNTER — Encounter: Payer: Self-pay | Admitting: Internal Medicine

## 2023-03-04 ENCOUNTER — Telehealth: Payer: Self-pay

## 2023-03-04 NOTE — Telephone Encounter (Signed)
MyChart message received with Apple Watch rhythm strips from Pt;  Pt called to follow up.    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  With the increased stress, she is in / out of AFIB almost once a day.  She bought an apple watch to track this, and wanted Dr. Lovena Le to review the rhythms.    Pt also states she has increased lower extremity pain.  Her knee has been bothering her, but she has found exercising more difficult.  Pt may exercise in a swimming pool to increase strength, but walking or standing for long periods of time is difficult.    Pt wants Dr. Lovena Le to be made aware of her concerns, and look at rhythm's MyCharted to [place].

## 2023-03-14 DIAGNOSIS — N939 Abnormal uterine and vaginal bleeding, unspecified: Secondary | ICD-10-CM | POA: Diagnosis not present

## 2023-03-14 DIAGNOSIS — Z30431 Encounter for routine checking of intrauterine contraceptive device: Secondary | ICD-10-CM | POA: Diagnosis not present

## 2023-04-21 ENCOUNTER — Encounter: Payer: Self-pay | Admitting: Internal Medicine

## 2023-04-21 ENCOUNTER — Ambulatory Visit: Payer: BC Managed Care – PPO | Attending: Internal Medicine | Admitting: Internal Medicine

## 2023-04-21 VITALS — BP 128/70 | HR 90 | Ht 60.0 in | Wt 183.0 lb

## 2023-04-21 DIAGNOSIS — I480 Paroxysmal atrial fibrillation: Secondary | ICD-10-CM | POA: Diagnosis not present

## 2023-04-21 DIAGNOSIS — I10 Essential (primary) hypertension: Secondary | ICD-10-CM

## 2023-04-21 DIAGNOSIS — I4892 Unspecified atrial flutter: Secondary | ICD-10-CM

## 2023-04-21 NOTE — Patient Instructions (Signed)

## 2023-04-21 NOTE — Progress Notes (Signed)
HPI Gail [name] returns today for followup. She is a pleasant obese 57 yo woman with HTN, atrial flutter who underwent EP study and catheter ablation. She has had brief episodes of atrial fib since her ablation. She has a cardiac monitor and this has demonstrated brief episodes of atrial fib lasting minutes. She denies chest pain but does have mild dyspnea.  No edema. She goes out of rhythm for 2-3 minutes at a time and feels palpitations lasting minutes. Her bp's have been controlled. Since I saw her last she does not think that her symptoms have progressed. Allergies  Allergen Reactions   Ace Inhibitors Cough   Erythromycin Ethylsuccinate     Stomach hurts REACTION: unspecified   can take azithromycin   Doxycycline Rash   Latex Rash     Current Outpatient Medications  Medication Sig Dispense Refill   albuterol (VENTOLIN HFA) 108 (90 Base) MCG/ACT inhaler Inhale 2 puffs into the lungs every 6 (six) hours as needed for wheezing or shortness of breath. 8 g 2   diltiazem (CARDIZEM CD) 360 MG 24 hr capsule TAKE 1 CAPSULE(360 MG) BY MOUTH DAILY 90 capsule 1   fluticasone (FLONASE) 50 MCG/ACT nasal spray 2 sprays per nostril daily.  MUST HAVE OFFICE VISIT FOR FURTHER REFILLS. (Patient taking differently: 2 sprays at bedtime. 2 sprays per nostril daily.  MUST HAVE OFFICE VISIT FOR FURTHER REFILLS.) 16 g 0   furosemide (LASIX) 40 MG tablet TAKE 1 TABLET(40 MG) BY MOUTH DAILY 90 tablet 2   Lactobacillus (ACIDOPHILUS PO) Take 1 capsule by mouth at bedtime.     levonorgestrel (MIRENA, 52 MG,) 20 MCG/24HR IUD 1 each by Intrauterine route once.     losartan (COZAAR) 50 MG tablet TAKE 1 TABLET(50 MG) BY MOUTH DAILY 90 tablet 1   metFORMIN (GLUCOPHAGE-XR) 500 MG 24 hr tablet TAKE 1 TABLET(500 MG) BY MOUTH DAILY WITH BREAKFAST 90 tablet 0   metroNIDAZOLE (METROGEL) 0.75 % gel Apply 1 application topically 2 (two) times daily. For rosacea (Patient taking differently: Apply 1 application  topically 2  (two) times daily as needed (rosacea).) 45 g 1   NON FORMULARY Pt uses a cpap nightly     nystatin ointment (MYCOSTATIN) Apply 1 Application topically daily. 30 g 0   potassium chloride (KLOR-CON) 10 MEQ tablet TAKE 1 TABLET(10 MEQ) BY MOUTH DAILY 90 tablet 2   RESTASIS 0.05 % ophthalmic emulsion Place 1 drop into both eyes in the morning.     rivaroxaban (XARELTO) 20 MG TABS tablet Take 1 tablet (20 mg total) by mouth daily with supper. 30 tablet 5   traMADol (ULTRAM) 50 MG tablet Take 50 mg by mouth every 12 (twelve) hours as needed.     vitamin B-12 (CYANOCOBALAMIN) 1000 MCG tablet Take 1,000 mcg by mouth daily.     Zinc Oxide 30.6 % CREA Apply 1 application topically every morning.     No current facility-administered medications for this visit.     Past Medical History:  Diagnosis Date   Anticoagulant long-term use    xarelto--- managed by dr g. Gail [name]   Anxiety    Chronic pain of both shoulders    Dysrhythmia    Family history of adverse reaction to anesthesia    mother--- ponv   Heart murmur    History of anal fissures    History of infertility, female    History of kidney stones    Hyperlipidemia    Hypertension    IBS (  irritable bowel syndrome)    Left ureteral stone    OA (osteoarthritis)    PAF (paroxysmal atrial fibrillation) ([facility])    PCO (polycystic ovaries)    PONV (postoperative nausea and vomiting)    RLS (restless legs syndrome)    Rosacea    Type 2 diabetes mellitus ([facility])     ROS:   All systems reviewed and negative except as noted in the HPI.   Past Surgical History:  Procedure Laterality Date   A-FLUTTER ABLATION N/A 07/16/2019   Procedure: A-FLUTTER ABLATION;  Surgeon: Marinus Maw, MD;  Location: [facility];  Service: Cardiovascular;  Laterality: N/A;   ATRIAL TACH ABLATION N/A 07/16/2019   Procedure: ATRIAL TACH ABLATION;  Surgeon: Marinus Maw, MD;  Location: [facility];  Service: Cardiovascular;  Laterality: N/A;    CARPAL TUNNEL RELEASE Bilateral 2006   HAND TENDON SURGERY Bilateral    bilateral thumb's   INTRAUTERINE DEVICE (IUD) INSERTION     KNEE ARTHROSCOPY Left 07/08/2010   @[facility]  by dr graves   SHOULDER ARTHROSCOPY WITH ROTATOR CUFF REPAIR AND OPEN BICEPS TENODESIS Left 12/21/2021   @[facility]    WISDOM TOOTH EXTRACTION       Family History  Problem Relation Age of Onset   Hypertension Mother    Heart disease Mother    Hypertension Father    ALS Father        deceased   Dementia Maternal Grandfather    Heart disease Paternal Grandmother    Dementia Paternal Grandfather    Hypertension Other    Lung cancer Other        uncle   Breast cancer Other        great aunt   Colon cancer Neg Hx    Stomach cancer Neg Hx    Liver cancer Neg Hx    Rectal cancer Neg Hx    Esophageal cancer Neg Hx      Social History   Socioeconomic History   Marital status: Married    Spouse name: Not on file   Number of children: 0   Years of education: Not on file   Highest education level: Not on file  Occupational History   Occupation: preschool Magazine features editor: [employer]  Tobacco Use   Smoking status: Former    Packs/day: 0.50    Years: 7.00    Additional pack years: 0.00    Total pack years: 3.50    Types: Cigarettes    Quit date: 1988    Years since quitting: 36.3   Smokeless tobacco: Never  Vaping Use   Vaping Use: Never used  Substance and Sexual Activity   Alcohol use: No    Alcohol/week: 0.0 standard drinks of alcohol   Drug use: No   Sexual activity: Not on file    Comment: married  Other Topics Concern   Not on file  Social History Narrative   Married   Pt doesn't get reg exercise   New pet puppy   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   Social Determinants of Health   Financial Resource Strain: Not on file  Food Insecurity: Not on file  Transportation Needs: Not on file   Physical Activity: Not on file  Stress: Not on file  Social Connections: Not on file  Intimate Partner Violence:  Not on file     Ht 5' (1.524 m)   BMI 36.13 kg/m   Physical Exam:  Well appearing NAD HEENT: Unremarkable Neck:  No JVD, no thyromegally Lymphatics:  No adenopathy Back:  No CVA tenderness Lungs:  Clear with no wheezes HEART:  Regular rate rhythm, no murmurs, no rubs, no clicks Abd:  soft, positive bowel sounds, no organomegally, no rebound, no guarding Ext:  2 plus pulses, no edema, no cyanosis, no clubbing Skin:  No rashes no nodules Neuro:  CN II through XII intact, motor grossly intact  EKG -nsr  Assess/Plan:  1. Atrial flutter - she has had none since her ablation. 2. Atrial fib - minimal symptoms. However, her risks is such that I think that she should remain on Xarleto unless she were to have bleeding. 3. HTN -her bp is well controlled today. Continue her current meds. 4. Peripheral edema - she is controlled. We discussed avoidance of salty foods. 5. Obesity - If she loses weight, she might be a candidate for afib ablation. Currently not severe enough to recommend though.   Gail Gowda Wilberto Console,MD

## 2023-04-26 DIAGNOSIS — Z713 Dietary counseling and surveillance: Secondary | ICD-10-CM | POA: Diagnosis not present

## 2023-05-18 ENCOUNTER — Ambulatory Visit: Payer: BC Managed Care – PPO | Admitting: Family Medicine

## 2023-05-18 VITALS — BP 118/50 | HR 90 | Temp 98.7°F | Ht 60.0 in | Wt 184.0 lb

## 2023-05-18 DIAGNOSIS — R350 Frequency of micturition: Secondary | ICD-10-CM

## 2023-05-18 LAB — POCT URINALYSIS DIPSTICK
Bilirubin, UA: NEGATIVE
Blood, UA: POSITIVE
Glucose, UA: NEGATIVE
Ketones, UA: NEGATIVE
Leukocytes, UA: NEGATIVE
Nitrite, UA: NEGATIVE
Protein, UA: NEGATIVE
Spec Grav, UA: >=1.03 — AB (ref 1.010–1.025)
Urobilinogen, UA: 0.2 E.U./dL
pH, UA: 6 (ref 5.0–8.0)

## 2023-05-18 LAB — URINALYSIS, ROUTINE W REFLEX MICROSCOPIC
Bilirubin Urine: NEGATIVE
Ketones, ur: NEGATIVE
Leukocytes,Ua: NEGATIVE
Nitrite: NEGATIVE
Specific Gravity, Urine: >=1.03 — AB (ref 1.000–1.030)
Total Protein, Urine: NEGATIVE
Urine Glucose: NEGATIVE
Urobilinogen, UA: 0.2 (ref 0.0–1.0)
pH: 6 (ref 5.0–8.0)

## 2023-05-18 NOTE — Progress Notes (Signed)
Subjective   CC:   Chief Complaint  Patient presents with   Urinary Frequency    Pt has had frequent urination for the past week    HPI: Gail [name] is a 57 y.o. female who presents to the office today to address the problems listed above in the chief complaint. Patient reports urinary frequency.  She has sensation of increased urinary pressure.  She denies fevers flank pain nausea vomiting or gross hematuria.  Symptoms have been going on for about a week.  She reports she had a long history of urinary and GYN concerns: I reviewed chart.  Has had workups for gross hematuria, kidney stones and possible postmenopausal vaginal bleeding.  She has follow-up with GYN and urology.  She denies any flank pain.  No vaginal discharge.  No fevers or chills.  Assessment  1. Frequent urination      Plan  Urinary frequency, dipstick shows possible blood.  Will send out for microanalysis and urine culture.  Recommend increasing water intake and monitoring symptoms.  Will await culture.  If negative, she will follow-up with GYN and urology.  Patient agrees and understands care plan  Follow up: as needed  Orders Placed This Encounter  Procedures   Urine Culture   Urinalysis, Routine w reflex microscopic   POCT Urinalysis Dipstick   No orders of the defined types were placed in this encounter.     I reviewed the patients updated PMH, FH, and SocHx.    Patient Active Problem List   Diagnosis Date Noted   Paroxysmal atrial fibrillation (HCC) 05/28/2020   Atrial flutter (HCC) 06/26/2019   OSA (obstructive sleep apnea) 05/04/2017   Hypersomnia 01/03/2017   Low normal vitamin B12 level 07/07/2015   Axillary lump 09/18/2014   Intermittent sleepiness 09/18/2014   Flushes 04/17/2014   Anxiety in acute stress reaction 03/13/2014   Teeth grinding 07/06/2013   Head pain left   07/06/2013   Metabolic syndrome 10/08/2012   Carbuncle 09/29/2012   Rosacea 09/29/2012   Chronic cough 05/03/2012    Hearing difficulty 02/25/2012   Eustachian tube dysfunction 02/25/2012   Heart palpitations 07/07/2011   Neck pain 04/01/2011   Back pain 04/01/2011   Obesity 01/05/2011   ASTHMA 12/29/2009   HYPERGLYCEMIA, BORDERLINE 11/25/2009   PALPITATIONS, RECURRENT 09/23/2009   TOBACCO USE, QUIT 09/23/2009   ARTHRITIS 04/30/2008   POLYCYSTIC OVARIES 10/16/2007   FEMALE INFERTILITY 10/16/2007   HYPERLIPIDEMIA 07/18/2007   Essential hypertension 07/18/2007   Current Meds  Medication Sig   albuterol (VENTOLIN HFA) 108 (90 Base) MCG/ACT inhaler Inhale 2 puffs into the lungs every 6 (six) hours as needed for wheezing or shortness of breath.   diltiazem (CARDIZEM CD) 360 MG 24 hr capsule TAKE 1 CAPSULE(360 MG) BY MOUTH DAILY   fluticasone (FLONASE) 50 MCG/ACT nasal spray 2 sprays per nostril daily.  MUST HAVE OFFICE VISIT FOR FURTHER REFILLS.   gabapentin (NEURONTIN) 300 MG capsule SMARTSIG:1 Capsule(s) By Mouth 1-3 Times Daily PRN   Lactobacillus (ACIDOPHILUS PO) Take 1 capsule by mouth at bedtime.   levonorgestrel (MIRENA, 52 MG,) 20 MCG/24HR IUD 1 each by Intrauterine route once.   losartan (COZAAR) 50 MG tablet TAKE 1 TABLET(50 MG) BY MOUTH DAILY   metFORMIN (GLUCOPHAGE-XR) 500 MG 24 hr tablet TAKE 1 TABLET(500 MG) BY MOUTH DAILY WITH BREAKFAST   NON FORMULARY Pt uses a cpap nightly   nystatin ointment (MYCOSTATIN) Apply 1 Application topically daily.   RESTASIS 0.05 % ophthalmic emulsion Place 1  drop into both eyes in the morning.   rivaroxaban (XARELTO) 20 MG TABS tablet Take 1 tablet (20 mg total) by mouth daily with supper.   vitamin B-12 (CYANOCOBALAMIN) 1000 MCG tablet Take 1,000 mcg by mouth daily.   Zinc Oxide 30.6 % CREA Apply 1 application topically every morning.    Review of Systems: Cardiovascular: negative for chest pain Respiratory: negative for SOB or persistent cough Gastrointestinal: negative for abdominal pain Constitutional: Negative for fever malaise or  anorexia  Objective  Vitals: BP (!) 118/50   Pulse 90   Temp 98.7 F (37.1 C)   Ht 5' (1.524 m)   Wt 184 lb (83.5 kg)   SpO2 97%   BMI 35.94 kg/m  General: no acute distress  Psych:  Alert and oriented, normal mood and affect Gastrointestinal: soft, flat abdomen, normal active bowel sounds, no palpable masses, no hepatosplenomegaly, no appreciated hernias, NO CVAT,  suprapubic ttp w/o rebound or guarding  Office Visit on 05/18/2023  Component Date Value Ref Range Status   Color, UA 05/18/2023 yellow   Final   Clarity, UA 05/18/2023 clear   Final   Glucose, UA 05/18/2023 Negative  Negative Final   Bilirubin, UA 05/18/2023 negative   Final   Ketones, UA 05/18/2023 negative   Final   Spec Grav, UA 05/18/2023 >=1.030 (A)  1.010 - 1.025 Final   Blood, UA 05/18/2023 positive   Final   pH, UA 05/18/2023 6.0  5.0 - 8.0 Final   Protein, UA 05/18/2023 Negative  Negative Final   Urobilinogen, UA 05/18/2023 0.2  0.2 or 1.0 E.U./dL Final   Nitrite, UA 40/98/1191 negative   Final   Leukocytes, UA 05/18/2023 Negative  Negative Final   Commons side effects, risks, benefits, and alternatives for medications and treatment plan prescribed today were discussed, and the patient expressed understanding of the given instructions. Patient is instructed to call or message via MyChart if he/she has any questions or concerns regarding our treatment plan. No barriers to understanding were identified. We discussed Red Flag symptoms and signs in detail. Patient expressed understanding regarding what to do in case of urgent or emergency type symptoms.  Medication list was reconciled, printed and provided to the patient in AVS. Patient instructions and summary information was reviewed with the patient as documented in the AVS. This note was prepared with assistance of Dragon voice recognition software. Occasional wrong-word or sound-a-like substitutions may have occurred due to the inherent limitations of voice  recognition software

## 2023-05-19 LAB — URINE CULTURE
MICRO NUMBER:: 15044620
SPECIMEN QUALITY:: ADEQUATE

## 2023-05-19 NOTE — Progress Notes (Signed)
See my chart note.

## 2023-05-20 NOTE — Progress Notes (Signed)
See my chart note.

## 2023-05-25 ENCOUNTER — Ambulatory Visit: Payer: BC Managed Care – PPO | Admitting: Family Medicine

## 2023-05-27 DIAGNOSIS — Z713 Dietary counseling and surveillance: Secondary | ICD-10-CM | POA: Diagnosis not present

## 2023-05-30 ENCOUNTER — Telehealth: Payer: Self-pay | Admitting: Internal Medicine

## 2023-05-30 DIAGNOSIS — I480 Paroxysmal atrial fibrillation: Secondary | ICD-10-CM

## 2023-05-30 MED ORDER — RIVAROXABAN 20 MG PO TABS
20.0000 mg | ORAL_TABLET | Freq: Every day | ORAL | 0 refills | Status: DC
Start: 2023-05-30 — End: 2023-11-16

## 2023-05-30 NOTE — Telephone Encounter (Signed)
*  STAT* If patient is at the pharmacy, call can be transferred to refill team.   1. Which medications need to be refilled? (please list name of each medication and dose if known)   rivaroxaban (XARELTO) 20 MG TABS tablet    2. Which pharmacy/location (including street and city if local pharmacy) is medication to be sent to?[id number] - [place], [place] - [address] [place] [address] [place] [address]   3. Do they need a 30 day or 90 day supply? 2 days  Pt is to get refill from [facility] but is out of medication now

## 2023-05-30 NOTE — Telephone Encounter (Signed)
Returned call to patient. She states she is out of her Xarelto and has missed one dose already. She is due to receive shipment on her next refill from [facility] on Wednesday 06/01/23. Patient states she just needs 2 tablets of Xarelto 20mg  sent to [facility] pharmacy to cover her until she receives her regular refill Wednesday.  Request for 2 tablets of Xarelto 20mg  sent to [facility].  Patient verbalized understanding and expressed appreciation for call.

## 2023-06-10 ENCOUNTER — Other Ambulatory Visit: Payer: Self-pay | Admitting: Internal Medicine

## 2023-06-10 DIAGNOSIS — I480 Paroxysmal atrial fibrillation: Secondary | ICD-10-CM

## 2023-06-22 DIAGNOSIS — R3121 Asymptomatic microscopic hematuria: Secondary | ICD-10-CM | POA: Diagnosis not present

## 2023-06-22 DIAGNOSIS — N3000 Acute cystitis without hematuria: Secondary | ICD-10-CM | POA: Diagnosis not present

## 2023-06-24 DIAGNOSIS — Z713 Dietary counseling and surveillance: Secondary | ICD-10-CM | POA: Diagnosis not present

## 2023-06-27 ENCOUNTER — Ambulatory Visit: Payer: BC Managed Care – PPO | Admitting: Internal Medicine

## 2023-06-27 DIAGNOSIS — Z01419 Encounter for gynecological examination (general) (routine) without abnormal findings: Secondary | ICD-10-CM | POA: Diagnosis not present

## 2023-06-29 ENCOUNTER — Encounter: Payer: Self-pay | Admitting: Internal Medicine

## 2023-06-29 ENCOUNTER — Ambulatory Visit (INDEPENDENT_AMBULATORY_CARE_PROVIDER_SITE_OTHER): Payer: BC Managed Care – PPO | Admitting: Internal Medicine

## 2023-06-29 VITALS — BP 110/60 | HR 84 | Temp 98.3°F | Ht 59.0 in | Wt 182.2 lb

## 2023-06-29 DIAGNOSIS — M79604 Pain in right leg: Secondary | ICD-10-CM

## 2023-06-29 DIAGNOSIS — Z0000 Encounter for general adult medical examination without abnormal findings: Secondary | ICD-10-CM

## 2023-06-29 DIAGNOSIS — Z7901 Long term (current) use of anticoagulants: Secondary | ICD-10-CM

## 2023-06-29 DIAGNOSIS — Z79899 Other long term (current) drug therapy: Secondary | ICD-10-CM

## 2023-06-29 DIAGNOSIS — M79605 Pain in left leg: Secondary | ICD-10-CM

## 2023-06-29 DIAGNOSIS — G4733 Obstructive sleep apnea (adult) (pediatric): Secondary | ICD-10-CM

## 2023-06-29 DIAGNOSIS — E8881 Metabolic syndrome: Secondary | ICD-10-CM

## 2023-06-29 DIAGNOSIS — R7309 Other abnormal glucose: Secondary | ICD-10-CM | POA: Diagnosis not present

## 2023-06-29 DIAGNOSIS — I10 Essential (primary) hypertension: Secondary | ICD-10-CM | POA: Diagnosis not present

## 2023-06-29 DIAGNOSIS — I480 Paroxysmal atrial fibrillation: Secondary | ICD-10-CM

## 2023-06-29 LAB — POCT GLYCOSYLATED HEMOGLOBIN (HGB A1C): Hemoglobin A1C: 5.8 % — AB (ref 4.0–5.6)

## 2023-06-29 NOTE — Patient Instructions (Signed)
Good to see you today  Titrate  miralax  to optimum. As we discussed . Yes water activity may help  with less joint pain . Ok to use topical voltaren and tylenol to help

## 2023-06-29 NOTE — Progress Notes (Signed)
Chief Complaint  Patient presents with   Annual Exam    Pt would like to discuss on her legs pain. States she have had a lot of trips but no falls due to her legs.     HPI: Patient  Gail [name]  57 y.o. comes in today for Preventive Health Care visit  Had uti rx macrobid  [facility] urology for renal stone  had flomax and pedal edema   neg cv renal eval, now better but  legs hurt a bit knee arthritis has seen ortho neg vascular assessment   taking  gabapentin at night to help RLS  groggy if taken in day .  Had gyne check 7 15 and iud check  Gaba pentin  for RLS  helps .  Health Maintenance  Topic Date Due   DTaP/Tdap/Td (3 - Tdap) 11/26/2019   COVID-19 Vaccine (3 - Pfizer risk series) 07/15/2023 (Originally 06/05/2020)   Zoster Vaccines- Shingrix (1 of 2) 09/29/2023 (Originally 08/31/1987)   Hepatitis C Screening  06/28/2024 (Originally 08/30/1986)   HIV Screening  06/28/2024 (Originally 08/31/1983)   INFLUENZA VACCINE  07/14/2023   MAMMOGRAM  12/08/2024   PAP SMEAR-Modifier  06/26/2026   Colonoscopy  10/18/2029   HPV VACCINES  Aged Out   Health Maintenance Review LIFESTYLE:  Exercise:   work  legs hurts    Tobacco/ETS: n Alcohol:  rare Sugar beverages: water Sleep: takes gabapentin  at night bed and  then gets up .  And back to bed.  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 taylor   Anxiety    Chronic pain of both shoulders    Dysrhythmia    Family history of adverse reaction to anesthesia    mother--- ponv   Heart murmur    History of anal fissures    History of infertility, female    History of kidney stones    Hyperlipidemia     Hypertension    IBS (irritable bowel syndrome)    Left ureteral stone    OA (osteoarthritis)    PAF (paroxysmal atrial fibrillation) (HCC)    PCO (polycystic ovaries)    PONV (postoperative nausea and vomiting)    RLS (restless legs syndrome)    Rosacea    Type 2 diabetes mellitus (HCC)     Past Surgical History:  Procedure Laterality Date   A-FLUTTER ABLATION N/A 07/16/2019   Procedure: A-FLUTTER ABLATION;  Surgeon: Marinus Maw, MD;  [facility]: [facility];  Service: Cardiovascular;  Laterality: N/A;   ATRIAL TACH ABLATION N/A 07/16/2019   Procedure: ATRIAL TACH ABLATION;  Surgeon: Marinus Maw, MD;  Location: [facility];  Service: Cardiovascular;  Laterality: N/A;   CARPAL TUNNEL RELEASE Bilateral 2006   HAND TENDON SURGERY Bilateral    bilateral thumb's   INTRAUTERINE DEVICE (IUD) INSERTION     KNEE ARTHROSCOPY Left 07/08/2010   @[facility]  by dr graves   SHOULDER ARTHROSCOPY WITH ROTATOR CUFF REPAIR AND OPEN BICEPS TENODESIS Left 12/21/2021   @[facility]    WISDOM TOOTH EXTRACTION      Family History  Problem Relation Age of Onset   Hypertension Mother    Heart disease Mother    Hypertension Father    ALS Father        deceased   Dementia Maternal Grandfather    Heart disease Paternal Grandmother    Dementia Paternal Grandfather    Hypertension Other    Lung cancer Other        uncle   Breast cancer Other        great aunt   Colon cancer Neg Hx    Stomach cancer Neg Hx    Liver cancer Neg Hx    Rectal cancer Neg Hx    Esophageal cancer Neg Hx     Social History   Socioeconomic History   Marital status: Married    Spouse name: Not on file   Number of children: 0   Years of education: Not on file   Highest education level: Not on file  Occupational History   Occupation: preschool Magazine features editor: [employer]  Tobacco Use   Smoking status: Former    Current packs/day: 0.00    Average packs/day: 0.5 packs/day for 7.0 years (3.5  ttl pk-yrs)    Types: Cigarettes    Start date: 9    Quit date: 1988    Years since quitting: 36.5   Smokeless tobacco: Never  Vaping Use   Vaping status: Never Used  Substance and Sexual Activity   Alcohol use: No    Alcohol/week: 0.0 standard drinks of alcohol   Drug use: No   Sexual activity: Not on file    Comment: married  Other Topics Concern   Not on file  Social History Narrative   Married   Pt doesn't get reg exercise   New pet puppy   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   Social Determinants of Health   Financial Resource Strain: Not on file  Food Insecurity: Not on file  Transportation Needs: Not on file  Physical Activity: Insufficiently Active (06/29/2023)   Exercise Vital Sign    Days of Exercise per Week: 7 days    Minutes of Exercise per Session: 10 min  Stress: Not on file  Social Connections: Not on file    Outpatient Medications Prior to Visit  Medication Sig Dispense Refill   albuterol (VENTOLIN HFA) 108 (90 Base) MCG/ACT inhaler Inhale 2 puffs into the lungs every 6 (six) hours as needed for wheezing or shortness of breath. 8 g 2   diltiazem (CARDIZEM CD) 360 MG 24 hr capsule Take 1 capsule by mouth daily. Please keep upcoming appointment in May 2024 with Dr. Ladona Ridgel before any more refills. 90 capsule 2   fluticasone (FLONASE) 50 MCG/ACT nasal spray 2 sprays per nostril daily.  MUST HAVE OFFICE VISIT FOR FURTHER REFILLS. (Patient taking differently: as needed. 2 sprays per nostril daily.) 16 g 0   gabapentin (NEURONTIN) 300 MG capsule SMARTSIG:1 Capsule(s) By Mouth 1-3 Times Daily PRN     Lactobacillus (ACIDOPHILUS PO) Take 1 capsule by mouth at bedtime.     levonorgestrel (MIRENA, 52 MG,) 20 MCG/24HR IUD 1 each by Intrauterine route once.     losartan (COZAAR) 50 MG tablet TAKE 1 TABLET(50 MG) BY MOUTH DAILY 90 tablet 1   metFORMIN (GLUCOPHAGE-XR) 500 MG 24 hr  tablet TAKE 1 TABLET(500 MG) BY  MOUTH DAILY WITH BREAKFAST 90 tablet 0   NON FORMULARY Pt uses a cpap nightly     nystatin ointment (MYCOSTATIN) Apply 1 Application topically daily. (Patient taking differently: Apply 1 Application topically as needed.) 30 g 0   rivaroxaban (XARELTO) 20 MG TABS tablet Take 1 tablet (20 mg total) by mouth daily with supper. 2 tablet 0   vitamin B-12 (CYANOCOBALAMIN) 1000 MCG tablet Take 1,000 mcg by mouth daily.     Zinc Oxide 30.6 % CREA Apply 1 application topically every morning.     furosemide (LASIX) 40 MG tablet TAKE 1 TABLET(40 MG) BY MOUTH DAILY (Patient not taking: Reported on 05/18/2023) 90 tablet 2   metroNIDAZOLE (METROGEL) 0.75 % gel Apply 1 application topically 2 (two) times daily. For rosacea (Patient not taking: Reported on 06/29/2023) 45 g 1   potassium chloride (KLOR-CON) 10 MEQ tablet TAKE 1 TABLET(10 MEQ) BY MOUTH DAILY (Patient not taking: Reported on 05/18/2023) 90 tablet 2   traMADol (ULTRAM) 50 MG tablet Take 50 mg by mouth every 12 (twelve) hours as needed. (Patient not taking: Reported on 05/18/2023)     RESTASIS 0.05 % ophthalmic emulsion Place 1 drop into both eyes in the morning. (Patient not taking: Reported on 06/29/2023)     No facility-administered medications prior to visit.     EXAM:  BP 110/60 (BP Location: Left Arm, Patient Position: Sitting, Cuff Size: Large)   Pulse 84   Temp 98.3 F (36.8 C) (Oral)   Ht 4\' 11"  (1.499 m)   Wt 182 lb 3.2 oz (82.6 kg)   SpO2 97%   BMI 36.80 kg/m   Body mass index is 36.8 kg/m. Wt Readings from Last 3 Encounters:  06/29/23 182 lb 3.2 oz (82.6 kg)  05/18/23 184 lb (83.5 kg)  04/21/23 183 lb (83 kg)    Physical Exam: Vital signs reviewed GEX:BMWU is a well-developed well-nourished alert cooperative    who appearsr stated age in no acute distress.  HEENT: normocephalic atraumatic , Eyes: PERRL EOM's full, conjunctiva clear, Nares: paten,t no deformity discharge or tenderness., Ears: no  deformity EAC's clear TMs with normal landmarks. Mouth: clear OP, no lesions, edema.  Moist mucous membranes. Dentition in adequate repair. NECK: supple without masses, thyromegaly or bruits. CHEST/PULM:  Clear to auscultation and percussion breath sounds equal no wheeze , rales or rhonchi. No chest wall deformities or tenderness. Breast: normal by inspection . No dimpling, discharge, masses, tenderness or discharge . CV: PMI is nondisplaced, S1 S2 no gallops, murmurs, rubs. Peripheral pulses are full without delay.No JVD .  ABDOMEN: Bowel sounds normal nontender  No guard or rebound, no hepato splenomegal no CVA tenderness.   Extremtities:  No clubbing cyanosis or edema, no acute joint swelling or redness no focal atrophy knees no acute swelling  NEURO:  Oriented x3, cranial nerves 3-12 appear to be intact, no obvious focal weakness,gait within normal limits no abnormal reflexes or asymmetrical SKIN: No acute rashes normal turgor, color, no bruising or petechiae. PSYCH: Oriented, good eye contact, no obvious depression anxiety, cognition and judgment appear normal. LN: no cervical axillary ladenopathy  Lab Results  Component Value Date   WBC 4.6 12/27/2022   HGB 13.8 12/27/2022   HCT 41.2 12/27/2022   PLT 221.0 12/27/2022   GLUCOSE 103 (H) 12/27/2022   CHOL 165 12/27/2022   TRIG 48.0 12/27/2022   HDL 51.00 12/27/2022   LDLDIRECT 160.7 12/10/2010   LDLCALC 105 (H) 12/27/2022   ALT 15 12/27/2022  AST 17 12/27/2022   NA 140 12/27/2022   K 4.0 12/27/2022   CL 106 12/27/2022   CREATININE 0.74 12/27/2022   BUN 13 12/27/2022   CO2 26 12/27/2022   TSH 2.020 06/16/2022   HGBA1C 5.8 (A) 06/29/2023    BP Readings from Last 3 Encounters:  06/29/23 110/60  05/18/23 (!) 118/50  04/21/23 128/70    Lab reviewed with patient  plan   ASSESSMENT AND PLAN:  Discussed the following assessment and plan:    ICD-10-CM   1. Visit for preventive health examination  Z00.00     2. Medication  management  Z79.899     3. Pain in both lower extremities  M79.604    M79.605     4. Essential hypertension  I10     5. OSA (obstructive sleep apnea)  G47.33     6. Metabolic syndrome  E88.810     7. Paroxysmal atrial fibrillation (HCC)  I48.0     8. Chronic anticoagulation  Z79.01     9. HYPERGLYCEMIA, BORDERLINE  R73.09 POC HgB A1c    Multiple med issues seem stable but djd knee pain and pls leg pains are the most interfering at this time , water thearpy or activity may help mainatin endurance and pain   A1c is stable  Return in about 1 year (around 06/28/2024) for depending on how doing.  Patient Care Team: Marykathleen Russi, Neta Mends, MD as PCP - General (Internal Medicine) Marinus Maw, MD as PCP - Cardiology (Cardiology) Marinus Maw, MD as PCP - Electrophysiology (Cardiology) Jonelle Sidle, MD (Cardiology) Lavina Hamman, MD as Consulting Physician (Obstetrics and Gynecology) Patient Instructions  Good to see you today  Titrate  miralax  to optimum. As we discussed . Yes water activity may help  with less joint pain . Ok to use topical voltaren and tylenol to help   [employer]. Trinna Kunst M.D.

## 2023-07-11 DIAGNOSIS — N390 Urinary tract infection, site not specified: Secondary | ICD-10-CM | POA: Diagnosis not present

## 2023-07-11 DIAGNOSIS — B961 Klebsiella pneumoniae [K. pneumoniae] as the cause of diseases classified elsewhere: Secondary | ICD-10-CM | POA: Diagnosis not present

## 2023-07-13 DIAGNOSIS — D225 Melanocytic nevi of trunk: Secondary | ICD-10-CM | POA: Diagnosis not present

## 2023-07-13 DIAGNOSIS — L503 Dermatographic urticaria: Secondary | ICD-10-CM | POA: Diagnosis not present

## 2023-07-13 DIAGNOSIS — L814 Other melanin hyperpigmentation: Secondary | ICD-10-CM | POA: Diagnosis not present

## 2023-07-13 DIAGNOSIS — L821 Other seborrheic keratosis: Secondary | ICD-10-CM | POA: Diagnosis not present

## 2023-07-26 DIAGNOSIS — Z713 Dietary counseling and surveillance: Secondary | ICD-10-CM | POA: Diagnosis not present

## 2023-07-27 ENCOUNTER — Other Ambulatory Visit: Payer: Self-pay | Admitting: Internal Medicine

## 2023-07-27 DIAGNOSIS — I10 Essential (primary) hypertension: Secondary | ICD-10-CM

## 2023-08-09 DIAGNOSIS — M170 Bilateral primary osteoarthritis of knee: Secondary | ICD-10-CM | POA: Diagnosis not present

## 2023-08-09 DIAGNOSIS — M1711 Unilateral primary osteoarthritis, right knee: Secondary | ICD-10-CM | POA: Diagnosis not present

## 2023-08-09 DIAGNOSIS — M1712 Unilateral primary osteoarthritis, left knee: Secondary | ICD-10-CM | POA: Diagnosis not present

## 2023-08-19 ENCOUNTER — Encounter: Payer: Self-pay | Admitting: Internal Medicine

## 2023-08-23 DIAGNOSIS — Z713 Dietary counseling and surveillance: Secondary | ICD-10-CM | POA: Diagnosis not present

## 2023-09-02 ENCOUNTER — Other Ambulatory Visit (HOSPITAL_COMMUNITY): Payer: Self-pay

## 2023-09-24 DIAGNOSIS — I10 Essential (primary) hypertension: Secondary | ICD-10-CM | POA: Diagnosis not present

## 2023-09-24 DIAGNOSIS — Z6835 Body mass index (BMI) 35.0-35.9, adult: Secondary | ICD-10-CM | POA: Diagnosis not present

## 2023-09-24 DIAGNOSIS — R051 Acute cough: Secondary | ICD-10-CM | POA: Diagnosis not present

## 2023-09-24 DIAGNOSIS — J029 Acute pharyngitis, unspecified: Secondary | ICD-10-CM | POA: Diagnosis not present

## 2023-09-26 ENCOUNTER — Encounter: Payer: Self-pay | Admitting: Family Medicine

## 2023-09-26 ENCOUNTER — Ambulatory Visit: Payer: BC Managed Care – PPO | Admitting: Family Medicine

## 2023-09-26 VITALS — BP 136/74 | HR 112 | Temp 99.6°F | Ht 59.0 in | Wt 183.4 lb

## 2023-09-26 DIAGNOSIS — R0981 Nasal congestion: Secondary | ICD-10-CM | POA: Diagnosis not present

## 2023-09-26 DIAGNOSIS — J0100 Acute maxillary sinusitis, unspecified: Secondary | ICD-10-CM | POA: Diagnosis not present

## 2023-09-26 DIAGNOSIS — R051 Acute cough: Secondary | ICD-10-CM | POA: Diagnosis not present

## 2023-09-26 LAB — POCT INFLUENZA A/B
Influenza A, POC: NEGATIVE
Influenza B, POC: NEGATIVE

## 2023-09-26 LAB — POC COVID19 BINAXNOW: SARS Coronavirus 2 Ag: NEGATIVE

## 2023-09-26 MED ORDER — AMOXICILLIN-POT CLAVULANATE 500-125 MG PO TABS
1.0000 | ORAL_TABLET | Freq: Two times a day (BID) | ORAL | 0 refills | Status: AC
Start: 2023-09-26 — End: 2023-10-03

## 2023-09-26 NOTE — Progress Notes (Unsigned)
Established Patient Office Visit   Subjective  Patient ID: Gail [name], female    DOB: Mar 26, 1966  Age: 57 y.o. MRN: [id number]  Chief Complaint  Patient presents with   Cough    Patient is a 57 year old female followed by Dr. Fabian Sharp and seen for acute concern.  [facility] due to cough and sore throat.  Given Rx for [employer].    Patient Active Problem List   Diagnosis Date Noted   Paroxysmal atrial fibrillation (HCC) 05/28/2020   Atrial flutter (HCC) 06/26/2019   OSA (obstructive sleep apnea) 05/04/2017   Hypersomnia 01/03/2017   Low normal vitamin B12 level 07/07/2015   Axillary lump 09/18/2014   Intermittent sleepiness 09/18/2014   Flushes 04/17/2014   Anxiety in acute stress reaction 03/13/2014   Teeth grinding 07/06/2013   Head pain left   07/06/2013   Metabolic syndrome 10/08/2012   Carbuncle 09/29/2012   Rosacea 09/29/2012   Chronic cough 05/03/2012   Hearing difficulty 02/25/2012   Eustachian tube dysfunction 02/25/2012   Heart palpitations 07/07/2011   Neck pain 04/01/2011   Back pain 04/01/2011   Obesity 01/05/2011   ASTHMA 12/29/2009   HYPERGLYCEMIA, BORDERLINE 11/25/2009   PALPITATIONS, RECURRENT 09/23/2009   TOBACCO USE, QUIT 09/23/2009   ARTHRITIS 04/30/2008   POLYCYSTIC OVARIES 10/16/2007   FEMALE INFERTILITY 10/16/2007   HYPERLIPIDEMIA 07/18/2007   Essential hypertension 07/18/2007   Past Medical History:  Diagnosis Date   Anticoagulant long-term use    xarelto--- managed by dr g. taylor   Anxiety    Chronic pain of both shoulders    Dysrhythmia    Family history of adverse reaction to anesthesia    mother--- ponv   Heart murmur    History of anal fissures    History of infertility, female    History of kidney stones    Hyperlipidemia    Hypertension    IBS (irritable bowel syndrome)    Left ureteral stone    OA (osteoarthritis)    PAF (paroxysmal atrial fibrillation) (HCC)    PCO  (polycystic ovaries)    PONV (postoperative nausea and vomiting)    RLS (restless legs syndrome)    Rosacea    Type 2 diabetes mellitus (HCC)    Past Surgical History:  Procedure Laterality Date   A-FLUTTER ABLATION N/A 07/16/2019   Procedure: A-FLUTTER ABLATION;  Surgeon: Marinus Maw, MD;  [facility];  Service: Cardiovascular;  Laterality: N/A;   ATRIAL TACH ABLATION N/A 07/16/2019   Procedure: ATRIAL TACH ABLATION;  Surgeon: Marinus Maw, MD;  [facility]: [facility];  Service: Cardiovascular;  Laterality: N/A;   CARPAL TUNNEL RELEASE Bilateral 2006   HAND TENDON SURGERY Bilateral    bilateral thumb's   INTRAUTERINE DEVICE (IUD) INSERTION     KNEE ARTHROSCOPY Left 07/08/2010   @[facility]  by dr Luiz Blare   SHOULDER ARTHROSCOPY WITH ROTATOR CUFF REPAIR AND OPEN BICEPS TENODESIS Left 12/21/2021   @[facility]    WISDOM TOOTH EXTRACTION     Social History   Tobacco Use   Smoking status: Former    Current packs/day: 0.00    Average packs/day: 0.5 packs/day for 7.0 years (3.5 ttl pk-yrs)    Types: Cigarettes    Start date: 30    Quit date: 1988    Years since quitting: 36.8   Smokeless tobacco: Never  Vaping Use   Vaping status: Never Used  Substance Use Topics   Alcohol use: No  Alcohol/week: 0.0 standard drinks of alcohol   Drug use: No   Family History  Problem Relation Age of Onset   Hypertension Mother    Heart disease Mother    Hypertension Father    ALS Father        deceased   Dementia Maternal Grandfather    Heart disease Paternal Grandmother    Dementia Paternal Grandfather    Hypertension Other    Lung cancer Other        uncle   Breast cancer Other        great aunt   Colon cancer Neg Hx    Stomach cancer Neg Hx    Liver cancer Neg Hx    Rectal cancer Neg Hx    Esophageal cancer Neg Hx    Allergies  Allergen Reactions   Ace Inhibitors Cough   Erythromycin Ethylsuccinate     Stomach hurts REACTION: unspecified   can take  azithromycin   Doxycycline Rash   Latex Rash      ROS Negative unless stated above    Objective:     BP 136/74 (BP Location: Left Arm, Patient Position: Sitting, Cuff Size: Normal)   Pulse (!) 112   Temp 99.6 F (37.6 C) (Oral)   Ht 4\' 11"  (1.499 m)   Wt 183 lb 6.4 oz (83.2 kg)   SpO2 98%   BMI 37.04 kg/m  BP Readings from Last 3 Encounters:  09/26/23 136/74  06/29/23 110/60  05/18/23 (!) 118/50   Wt Readings from Last 3 Encounters:  09/26/23 183 lb 6.4 oz (83.2 kg)  06/29/23 182 lb 3.2 oz (82.6 kg)  05/18/23 184 lb (83.5 kg)      Physical Exam Constitutional:      General: She is not in acute distress.    Appearance: Normal appearance.  HENT:     Head: Normocephalic and atraumatic.     Nose: Nose normal.     Mouth/Throat:     Mouth: Mucous membranes are moist.  Eyes:     Extraocular Movements: Extraocular movements intact.     Conjunctiva/sclera: Conjunctivae normal.  Cardiovascular:     Rate and Rhythm: Normal rate and regular rhythm.     Heart sounds: Normal heart sounds. No murmur heard.    No gallop.  Pulmonary:     Effort: Pulmonary effort is normal. No respiratory distress.     Breath sounds: Normal breath sounds. No wheezing, rhonchi or rales.     Comments: Cough. Skin:    General: Skin is warm and dry.  Neurological:     Mental Status: She is alert and oriented to person, place, and time.      Results for orders placed or performed in visit on 09/26/23  POC Influenza A/B  Result Value Ref Range   Influenza A, POC Negative Negative   Influenza B, POC Negative Negative  POC COVID-19 BinaxNow  Result Value Ref Range   SARS Coronavirus 2 Ag Negative Negative      Assessment & Plan:  Acute maxillary sinusitis, recurrence not specified -     Amoxicillin-Pot Clavulanate; Take 1 tablet by mouth in the morning and at bedtime for 7 days.  Dispense: 14 tablet; Refill: 0  Nasal congestion -     POCT Influenza A/B -     POC COVID-19  BinaxNow  Acute cough  Acute URI symptoms with progression to sinusitis.  COVID and flu testing negative in clinic.  Start ABX.  Supportive care with OTC cough  and cold medications, Tylenol or NSAIDs as needed, antihistamine, rest, hydration, etc.  Return with pcp if symptoms worsen or fail to improve.   Deeann Saint, MD

## 2023-09-26 NOTE — Patient Instructions (Signed)
Your COVID and flu testing were negative.  A prescription for antibiotics Augmentin was sent to your pharmacy to help with sinus infection.  You can take over-the-counter allergy medication to help with the drainage and throat irritation.

## 2023-09-27 DIAGNOSIS — Z713 Dietary counseling and surveillance: Secondary | ICD-10-CM | POA: Diagnosis not present

## 2023-09-29 ENCOUNTER — Encounter: Payer: Self-pay | Admitting: Family Medicine

## 2023-11-01 DIAGNOSIS — Z713 Dietary counseling and surveillance: Secondary | ICD-10-CM | POA: Diagnosis not present

## 2023-11-15 ENCOUNTER — Other Ambulatory Visit: Payer: Self-pay | Admitting: Internal Medicine

## 2023-11-15 DIAGNOSIS — I480 Paroxysmal atrial fibrillation: Secondary | ICD-10-CM

## 2023-11-16 NOTE — Telephone Encounter (Signed)
Prescription refill request for Xarelto received.  Indication: A Flutter Last office visit: 04/21/23  Rosette Reveal MD Weight: 83kg Age: 57 Scr: 0.74 on 12/27/22  Epic CrCl: 112.55  Based on above findings Xarelto 20mg  daily is the appropriate dose.  Refill approved.

## 2023-11-22 DIAGNOSIS — N3000 Acute cystitis without hematuria: Secondary | ICD-10-CM | POA: Diagnosis not present

## 2023-11-22 DIAGNOSIS — R300 Dysuria: Secondary | ICD-10-CM | POA: Diagnosis not present

## 2023-11-29 DIAGNOSIS — Z713 Dietary counseling and surveillance: Secondary | ICD-10-CM | POA: Diagnosis not present

## 2023-12-01 ENCOUNTER — Other Ambulatory Visit: Payer: Self-pay | Admitting: Family Medicine

## 2023-12-01 DIAGNOSIS — J989 Respiratory disorder, unspecified: Secondary | ICD-10-CM

## 2023-12-05 NOTE — Progress Notes (Signed)
ACUTE VISIT No chief complaint on file.  HPI: Gail [name] is a 57 y.o. female with a PMHx significant for HTN, paroxysmal atrial fibrillation, asthma, OSA, HLD, and chronic cough, among others, who is here today complaining of cough and fever.   Review of Systems See other pertinent positives and negatives in HPI.  Current Outpatient Medications on File Prior to Visit  Medication Sig Dispense Refill   albuterol (VENTOLIN HFA) 108 (90 Base) MCG/ACT inhaler INHALE 2 PUFFS INTO THE LUNGS EVERY 6 HOURS AS NEEDED FOR WHEEZING OR SHORTNESS OF BREATH 6.7 g 1   diltiazem (CARDIZEM CD) 360 MG 24 hr capsule Take 1 capsule by mouth daily. Please keep upcoming appointment in May 2024 with Dr. Ladona Ridgel before any more refills. 90 capsule 2   fluticasone (FLONASE) 50 MCG/ACT nasal spray 2 sprays per nostril daily.  MUST HAVE OFFICE VISIT FOR FURTHER REFILLS. (Patient taking differently: as needed. 2 sprays per nostril daily.) 16 g 0   furosemide (LASIX) 40 MG tablet TAKE 1 TABLET(40 MG) BY MOUTH DAILY 90 tablet 2   gabapentin (NEURONTIN) 300 MG capsule SMARTSIG:1 Capsule(s) By Mouth 1-3 Times Daily PRN     Lactobacillus (ACIDOPHILUS PO) Take 1 capsule by mouth at bedtime.     levonorgestrel (MIRENA, 52 MG,) 20 MCG/24HR IUD 1 each by Intrauterine route once.     losartan (COZAAR) 50 MG tablet Take 1 tablet by mouth daily. Must keep upcoming appointment in May 2024 with Dr. Ladona Ridgel before anymore refills. Final attempt. 90 tablet 3   metFORMIN (GLUCOPHAGE-XR) 500 MG 24 hr tablet TAKE 1 TABLET(500 MG) BY MOUTH DAILY WITH BREAKFAST 90 tablet 0   metroNIDAZOLE (METROGEL) 0.75 % gel Apply 1 application topically 2 (two) times daily. For rosacea 45 g 1   NON FORMULARY Pt uses a cpap nightly     nystatin ointment (MYCOSTATIN) Apply 1 Application topically daily. (Patient taking differently: Apply 1 Application topically as needed.) 30 g 0   potassium chloride (KLOR-CON) 10 MEQ tablet TAKE 1 TABLET(10 MEQ)  BY MOUTH DAILY 90 tablet 2   rivaroxaban (XARELTO) 20 MG TABS tablet TAKE 1 TABLET BY MOUTH DAILY WITH SUPPER 30 tablet 5   traMADol (ULTRAM) 50 MG tablet Take 50 mg by mouth every 12 (twelve) hours as needed.     vitamin B-12 (CYANOCOBALAMIN) 1000 MCG tablet Take 1,000 mcg by mouth daily.     Zinc Oxide 30.6 % CREA Apply 1 application topically every morning.     No current facility-administered medications on file prior to visit.    Past Medical History:  Diagnosis Date   Anticoagulant long-term use    xarelto--- managed by dr g. taylor   Anxiety    Chronic pain of both shoulders    Dysrhythmia    Family history of adverse reaction to anesthesia    mother--- ponv   Heart murmur    History of anal fissures    History of infertility, female    History of kidney stones    Hyperlipidemia    Hypertension    IBS (irritable bowel syndrome)    Left ureteral stone    OA (osteoarthritis)    PAF (paroxysmal atrial fibrillation) (HCC)    PCO (polycystic ovaries)    PONV (postoperative nausea and vomiting)    RLS (restless legs syndrome)    Rosacea    Type 2 diabetes mellitus (HCC)    Allergies  Allergen Reactions   Ace Inhibitors Cough   Erythromycin Ethylsuccinate  Stomach hurts REACTION: unspecified   can take azithromycin   Doxycycline Rash   Latex Rash    Social History   Socioeconomic History   Marital status: Married    Spouse name: Not on file   Number of children: 0   Years of education: Not on file   Highest education level: Not on file  Occupational History   Occupation: preschool Magazine features editor: [employer]  Tobacco Use   Smoking status: Former    Current packs/day: 0.00    Average packs/day: 0.5 packs/day for 7.0 years (3.5 ttl pk-yrs)    Types: Cigarettes    Start date: 65    Quit date: 69    Years since quitting: 37.0   Smokeless tobacco: Never  Vaping Use   Vaping status: Never Used  Substance and Sexual Activity   Alcohol  use: No    Alcohol/week: 0.0 standard drinks of alcohol   Drug use: No   Sexual activity: Not on file    Comment: married  Other Topics Concern   Not on file  Social History Narrative   Married   Pt doesn't get reg exercise   New pet puppy   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   Social Drivers of Corporate investment banker Strain: Not on file  Food Insecurity: Not on file  Transportation Needs: Not on file  Physical Activity: Insufficiently Active (06/29/2023)   Exercise Vital Sign    Days of Exercise per Week: 7 days    Minutes of Exercise per Session: 10 min  Stress: Not on file  Social Connections: Not on file    There were no vitals filed for this visit. There is no height or weight on file to calculate BMI.  Physical Exam  ASSESSMENT AND PLAN:  Gail [name] was seen today for cough and fever.   There are no diagnoses linked to this encounter.  No follow-ups on file.  I, Suanne Marker, acting as a scribe for Rylei Codispoti [place], MD., have documented all relevant documentation on the behalf of Kora Groom [place], MD, as directed by  Chena Chohan [place], MD while in the presence of Coni Homesley [place], MD.   I, Suanne Marker, have reviewed all documentation for this visit. The documentation on 12/05/23 for the exam, diagnosis, procedures, and orders are all accurate and complete.  Onyekachi Gathright G. [place], MD  [facility] [place] office.  Discharge Instructions   None

## 2023-12-06 ENCOUNTER — Encounter: Payer: Self-pay | Admitting: Family Medicine

## 2023-12-06 ENCOUNTER — Ambulatory Visit: Payer: BC Managed Care – PPO | Admitting: Family Medicine

## 2023-12-06 VITALS — BP 120/70 | HR 96 | Temp 98.1°F | Resp 16 | Ht 59.0 in | Wt 185.0 lb

## 2023-12-06 DIAGNOSIS — J4521 Mild intermittent asthma with (acute) exacerbation: Secondary | ICD-10-CM | POA: Diagnosis not present

## 2023-12-06 DIAGNOSIS — J988 Other specified respiratory disorders: Secondary | ICD-10-CM

## 2023-12-06 DIAGNOSIS — R051 Acute cough: Secondary | ICD-10-CM

## 2023-12-06 MED ORDER — PREDNISONE 20 MG PO TABS: 40.0000 mg | ORAL_TABLET | Freq: Every day | ORAL | 0 refills | Status: AC

## 2023-12-06 MED ORDER — AMOXICILLIN-POT CLAVULANATE 875-125 MG PO TABS: 1.0000 | ORAL_TABLET | Freq: Two times a day (BID) | ORAL | 0 refills | Status: AC

## 2023-12-06 MED ORDER — HYDROCODONE BIT-HOMATROP MBR 5-1.5 MG/5ML PO SOLN: 5.0000 mL | Freq: Two times a day (BID) | ORAL | 0 refills | Status: DC | PRN

## 2023-12-06 MED ORDER — IPRATROPIUM-ALBUTEROL 0.5-2.5 (3) MG/3ML IN SOLN
3.0000 mL | Freq: Once | RESPIRATORY_TRACT | Status: AC
  Administered 2023-12-06: 3 mL via RESPIRATORY_TRACT

## 2023-12-06 NOTE — Patient Instructions (Addendum)
A few things to remember from today's visit:  Respiratory tract infection - Plan: ipratropium-albuterol (DUONEB) 0.5-2.5 (3) MG/3ML nebulizer solution 3 mL, amoxicillin-clavulanate (AUGMENTIN) 875-125 MG tablet  Mild intermittent asthma with exacerbation - Plan: ipratropium-albuterol (DUONEB) 0.5-2.5 (3) MG/3ML nebulizer solution 3 mL, predniSONE (DELTASONE) 20 MG tablet  Acute cough - Plan: HYDROcodone bit-homatropine (HYCODAN) 5-1.5 MG/5ML syrup Most likely viral. I do not think you need antibiotics at this time, I sent a prescription for Augmentin in case symptoms get worse or you develop fever again. Take prednisone with breakfast for 5 days. Albuterol inh 2 puff every 6 hours for a week then as needed for wheezing or shortness of breath.  Take cough syrup mainly at night and do not take tramadol when you take it.  If you need refills for medications you take chronically, please call your pharmacy. Do not use My Chart to request refills or for acute issues that need immediate attention. If you send a my chart message, it may take a few days to be addressed, specially if I am not in the office.  Please be sure medication list is accurate. If a new problem present, please set up appointment sooner than planned today.

## 2023-12-12 ENCOUNTER — Telehealth: Payer: Self-pay

## 2023-12-12 ENCOUNTER — Encounter: Payer: Self-pay | Admitting: Family Medicine

## 2023-12-12 ENCOUNTER — Ambulatory Visit: Payer: BC Managed Care – PPO | Admitting: Family Medicine

## 2023-12-12 DIAGNOSIS — R051 Acute cough: Secondary | ICD-10-CM

## 2023-12-12 MED ORDER — HYDROCODONE BIT-HOMATROP MBR 5-1.5 MG/5ML PO SOLN
5.0000 mL | Freq: Four times a day (QID) | ORAL | 0 refills | Status: AC | PRN
Start: 2023-12-12 — End: ?

## 2023-12-12 NOTE — Progress Notes (Signed)
Assessment & Plan:  1. Acute cough Encouraged to continue Mucinex, humidifier, albuterol, and antibiotics as prescribed.  Refilled Hycodan as she reports she spilled half of the bottle.  Encouraged her to take as needed and not keep herself miserable coughing.  Note provided for work today and tomorrow. - HYDROcodone bit-homatropine (HYCODAN) 5-1.5 MG/5ML syrup; Take 5 mLs by mouth every 6 (six) hours as needed for cough.  Dispense: 120 mL; Refill: 0  No results found for any visits on 12/12/23.  Follow up plan: Return if symptoms worsen or fail to improve.  Deliah Boston, MSN, APRN, FNP-C  Subjective:  HPI: Gail [name] is a 57 y.o. female presenting on 12/12/2023 for Cough (X 13 days - seen on day 7 and got pred, augmentin and hycodan. Will need work note. )  Patient complains of cough, runny nose, fever, and wheezing. Onset of symptoms was  13  days ago, gradually worsening since that time. She is drinking plenty of fluids. Evaluation to date: Seen previously and treated with prednisone for an asthma exacerbation.  She was given Augmentin to start if symptoms did not improve after a few days which she started 2-3 days ago. Treatment to date: Albuterol every 4 hours, Mucinex, humidifier, Hycodan, Prednisone, and Augmentin. She has only been taking the Hycodan when she absolutely can't take it anymore and just wants to rest. She has a history of asthma. She does not smoke.    ROS: Negative unless specifically indicated above in HPI.   Relevant past medical history reviewed and updated as indicated.   Allergies and medications reviewed and updated.   Current Outpatient Medications:    albuterol (VENTOLIN HFA) 108 (90 Base) MCG/ACT inhaler, INHALE 2 PUFFS INTO THE LUNGS EVERY 6 HOURS AS NEEDED FOR WHEEZING OR SHORTNESS OF BREATH, Disp: 6.7 g, Rfl: 1   amoxicillin-clavulanate (AUGMENTIN) 875-125 MG tablet, Take 1 tablet by mouth 2 (two) times daily for 7 days., Disp: 14 tablet, Rfl:  0   diltiazem (CARDIZEM CD) 360 MG 24 hr capsule, Take 1 capsule by mouth daily. Please keep upcoming appointment in May 2024 with Dr. Ladona Ridgel before any more refills., Disp: 90 capsule, Rfl: 2   fluticasone (FLONASE) 50 MCG/ACT nasal spray, 2 sprays per nostril daily.  MUST HAVE OFFICE VISIT FOR FURTHER REFILLS. (Patient taking differently: as needed. 2 sprays per nostril daily.), Disp: 16 g, Rfl: 0   gabapentin (NEURONTIN) 300 MG capsule, SMARTSIG:1 Capsule(s) By Mouth 1-3 Times Daily PRN, Disp: , Rfl:    HYDROcodone bit-homatropine (HYCODAN) 5-1.5 MG/5ML syrup, Take 5 mLs by mouth every 12 (twelve) hours as needed for up to 10 days for cough., Disp: 80 mL, Rfl: 0   Lactobacillus (ACIDOPHILUS PO), Take 1 capsule by mouth at bedtime., Disp: , Rfl:    levonorgestrel (MIRENA, 52 MG,) 20 MCG/24HR IUD, 1 each by Intrauterine route once., Disp: , Rfl:    losartan (COZAAR) 50 MG tablet, Take 1 tablet by mouth daily. Must keep upcoming appointment in May 2024 with Dr. Ladona Ridgel before anymore refills. Final attempt., Disp: 90 tablet, Rfl: 3   metFORMIN (GLUCOPHAGE-XR) 500 MG 24 hr tablet, TAKE 1 TABLET(500 MG) BY MOUTH DAILY WITH BREAKFAST, Disp: 90 tablet, Rfl: 0   metroNIDAZOLE (METROGEL) 0.75 % gel, Apply 1 application topically 2 (two) times daily. For rosacea, Disp: 45 g, Rfl: 1   NON FORMULARY, Pt uses a cpap nightly, Disp: , Rfl:    nystatin ointment (MYCOSTATIN), Apply 1 Application topically daily. (Patient taking differently:  Apply 1 Application topically as needed.), Disp: 30 g, Rfl: 0   rivaroxaban (XARELTO) 20 MG TABS tablet, TAKE 1 TABLET BY MOUTH DAILY WITH SUPPER, Disp: 30 tablet, Rfl: 5   vitamin B-12 (CYANOCOBALAMIN) 1000 MCG tablet, Take 1,000 mcg by mouth daily., Disp: , Rfl:    Zinc Oxide 30.6 % CREA, Apply 1 application topically every morning., Disp: , Rfl:    traMADol (ULTRAM) 50 MG tablet, Take 50 mg by mouth every 12 (twelve) hours as needed. (Patient not taking: Reported on  12/12/2023), Disp: , Rfl:   Allergies  Allergen Reactions   Ace Inhibitors Cough   Erythromycin Ethylsuccinate     Stomach hurts REACTION: unspecified   can take azithromycin   Doxycycline Rash   Latex Rash    Objective:   BP 128/82   Pulse 100   Temp 98.3 F (36.8 C)   Ht 4\' 11"  (1.499 m)   Wt 193 lb (87.5 kg)   LMP  (Exact Date)   SpO2 94%   BMI 38.98 kg/m    Physical Exam Vitals reviewed.  Constitutional:      General: She is not in acute distress.    Appearance: Normal appearance. She is not ill-appearing, toxic-appearing or diaphoretic.  HENT:     Head: Normocephalic and atraumatic.     Right Ear: Tympanic membrane, ear canal and external ear normal. There is no impacted cerumen.     Left Ear: Tympanic membrane, ear canal and external ear normal. There is no impacted cerumen.     Nose: Congestion present. No rhinorrhea.     Right Sinus: No maxillary sinus tenderness or frontal sinus tenderness.     Left Sinus: No maxillary sinus tenderness or frontal sinus tenderness.     Mouth/Throat:     Mouth: Mucous membranes are moist.     Pharynx: Oropharynx is clear. Posterior oropharyngeal erythema present. No oropharyngeal exudate.  Eyes:     General: No scleral icterus.       Right eye: No discharge.        Left eye: No discharge.     Conjunctiva/sclera: Conjunctivae normal.  Cardiovascular:     Rate and Rhythm: Normal rate and regular rhythm.     Heart sounds: Normal heart sounds. No murmur heard.    No friction rub. No gallop.  Pulmonary:     Effort: Pulmonary effort is normal. No respiratory distress.     Breath sounds: Normal breath sounds. No stridor. No wheezing, rhonchi or rales.  Musculoskeletal:        General: Normal range of motion.     Cervical back: Normal range of motion.  Lymphadenopathy:     Cervical: No cervical adenopathy.  Skin:    General: Skin is warm and dry.     Capillary Refill: Capillary refill takes less than 2 seconds.   Neurological:     General: No focal deficit present.     Mental Status: She is alert and oriented to person, place, and time. Mental status is at baseline.  Psychiatric:        Mood and Affect: Mood normal.        Behavior: Behavior normal.        Thought Content: Thought content normal.        Judgment: Judgment normal.

## 2023-12-12 NOTE — Telephone Encounter (Signed)
Spoke to pt. Pt reports she got in with a provider at [facility] today. Was advise to continue her cough syrup to help the calm down the inflammation. Continue her antibiotic and inhaler. If not getting better next week, she states she will contact for a follow up appt.

## 2023-12-12 NOTE — Telephone Encounter (Signed)
Copied from CRM [id number]. Topic: Clinical - Medical Advice >> Dec 12, 2023  8:40 AM Gail [name] wrote: Reason for CRM: Patient is still not feeling any better, was told per Dr. [place] to come back in a week if still not feeling well. No available appointments any time soon, Patient would like to know what should she do, is requesting a call back from Dr. [place] or her nurses

## 2023-12-15 ENCOUNTER — Telehealth: Payer: Self-pay | Admitting: *Deleted

## 2023-12-15 NOTE — Telephone Encounter (Signed)
 Copied from CRM [id number]. Topic: Clinical - Medical Advice >> Dec 15, 2023  2:31 PM Zacyrah J wrote: Reason for CRM: pt was seen and was giving breathing treatments, pt hasn't gotten better since then, pt is still coughing really bad. Pt did test positive for Covid. Pt would like to know what to do or is there anything else she can take

## 2023-12-16 NOTE — Telephone Encounter (Signed)
 Spoke to pt.   Pt reports her cough has gotten better. Was seen with provider on [facility] on 12/30.  Pt reports she didn't have bronchitis, was given more of  cough medication to help calm her sx. Pt states her sx worsen with head cold on Wednesday, low grade fever. Tested positive for covid on Thursday.   Pt states she is using afrin nasal spray, alternating it with Flonase , taking tylenol , using humidifyer, home remedy- honey/lemon tea. Pt continues she has inhaler to use if needed. She is not as wheezy as before. Pt mentions she was sick on christmas eve, husband is sick and had a case of walking pneumonia in class. Pt states she will monitor her symptoms and call us  next week if sx not getting better.

## 2023-12-21 ENCOUNTER — Encounter: Payer: Self-pay | Admitting: Family Medicine

## 2023-12-21 ENCOUNTER — Ambulatory Visit: Payer: BC Managed Care – PPO | Admitting: Family Medicine

## 2023-12-21 VITALS — BP 120/62 | HR 91 | Temp 97.7°F | Resp 16 | Ht 59.0 in | Wt 189.0 lb

## 2023-12-21 DIAGNOSIS — J4521 Mild intermittent asthma with (acute) exacerbation: Secondary | ICD-10-CM | POA: Diagnosis not present

## 2023-12-21 DIAGNOSIS — R059 Cough, unspecified: Secondary | ICD-10-CM

## 2023-12-21 MED ORDER — SPACER/AERO-HOLDING CHAMBERS DEVI: 1.0000 | Freq: Two times a day (BID) | 0 refills | Status: AC

## 2023-12-21 MED ORDER — BUDESONIDE-FORMOTEROL FUMARATE 160-4.5 MCG/ACT IN AERO: 2.0000 | INHALATION_SPRAY | Freq: Two times a day (BID) | RESPIRATORY_TRACT | 1 refills | Status: DC

## 2023-12-21 MED ORDER — AEROCHAMBER PLUS FLO-VU MISC: 0 refills | Status: AC

## 2023-12-21 MED ORDER — PREDNISONE 20 MG PO TABS: ORAL_TABLET | ORAL | 0 refills | Status: AC

## 2023-12-21 MED ORDER — AEROCHAMBER PLUS FLO-VU MISC: 0 refills | Status: DC

## 2023-12-21 NOTE — Patient Instructions (Addendum)
 A few things to remember from today's visit:  Mild intermittent asthma with acute exacerbation - Plan: budesonide -formoterol  (SYMBICORT ) 160-4.5 MCG/ACT inhaler, predniSONE  (DELTASONE ) 20 MG tablet  Cough, unspecified type  Continue cough syrup 2.5 ml 3 times per day as needed. Plain Mucinex  may help. Symbicort  started today, rinse mouth after use. Continue Albuterol  inh as needed. Take Prednisone  taper if wheezing continues.  If you need refills for medications you take chronically, please call your pharmacy. Do not use My Chart to request refills or for acute issues that need immediate attention. If you send a my chart message, it may take a few days to be addressed, specially if I am not in the office.  Please be sure medication list is accurate. If a new problem present, please set up appointment sooner than planned today.

## 2023-12-21 NOTE — Progress Notes (Signed)
 HPI: [name] is a 58 y.o. female with a PMHx significant for HTN, atrial fibrillation, OSA, asthma, HLD, and chronic cough, among others, who is here today to follow on a visit for asthma exacerbation on 12/06/2023.   Patient states she is still having congestion, cough, and wheezing since her last visit. At that visit, she was given prednisone , augmentin , and albuterol .  She went to another clinic on 12/30 for persistent cough, and was planning to return to work on 1/2. She was feeling much better; however, she woke up feeling ill again the morning of 12/14/23 and tested positive with a home test for Covid. She did not seek medical attention, called our office to let PCP know. The last time she had fever was 1/4, and it got as high as 100.0 F.   Cough This is a new problem. The current episode started 1 to 4 weeks ago. The problem has been gradually improving. The cough is Non-productive. Associated symptoms include nasal congestion, postnasal drip, rhinorrhea, shortness of breath and wheezing. Pertinent negatives include no chest pain, chills, ear congestion, ear pain, fever, headaches, heartburn, hemoptysis, myalgias, rash, sore throat, sweats or weight loss. The symptoms are aggravated by lying down and exercise. She has tried oral steroids, a beta-agonist inhaler, rest and prescription cough suppressant for the symptoms. The treatment provided moderate relief. Her past medical history is significant for asthma and environmental allergies.   She states that while she was on Prednisone  she felt well, it also helped with chronic arthralgias.  Wheezing and non productive cough spells , not as bad as it was.  Albuterol  inh and Hycodan help with cough.  Her husband also was sick with COVID 19 infection at the same time she was dx'ed, his symptoms have resolved.  Review of Systems  Constitutional:  Negative for chills, fever and weight loss.  HENT:  Positive for postnasal drip and  rhinorrhea. Negative for ear pain and sore throat.   Respiratory:  Positive for cough, shortness of breath and wheezing. Negative for hemoptysis.   Cardiovascular:  Negative for chest pain.  Gastrointestinal:  Negative for abdominal pain, heartburn, nausea and vomiting.  Musculoskeletal:  Positive for arthralgias. Negative for myalgias.  Skin:  Negative for rash.  Allergic/Immunologic: Positive for environmental allergies.  Neurological:  Negative for syncope, weakness and headaches.  Psychiatric/Behavioral:  Negative for confusion.   See other pertinent positives and negatives in HPI.  Current Outpatient Medications on File Prior to Visit  Medication Sig Dispense Refill   albuterol  (VENTOLIN  HFA) 108 (90 Base) MCG/ACT inhaler INHALE 2 PUFFS INTO THE LUNGS EVERY 6 HOURS AS NEEDED FOR WHEEZING OR SHORTNESS OF BREATH 6.7 g 1   diltiazem  (CARDIZEM  CD) 360 MG 24 hr capsule Take 1 capsule by mouth daily. Please keep upcoming appointment in May 2024 with Dr. Waddell before any more refills. 90 capsule 2   fluticasone  (FLONASE ) 50 MCG/ACT nasal spray 2 sprays per nostril daily.  MUST HAVE OFFICE VISIT FOR FURTHER REFILLS. (Patient taking differently: as needed. 2 sprays per nostril daily.) 16 g 0   gabapentin  (NEURONTIN ) 300 MG capsule SMARTSIG:1 Capsule(s) By Mouth 1-3 Times Daily PRN     HYDROcodone  bit-homatropine (HYCODAN) 5-1.5 MG/5ML syrup Take 5 mLs by mouth every 6 (six) hours as needed for cough. 120 mL 0   Lactobacillus (ACIDOPHILUS PO) Take 1 capsule by mouth at bedtime.     levonorgestrel  (MIRENA , 52 MG,) 20 MCG/24HR IUD 1 each by Intrauterine route once.  losartan  (COZAAR ) 50 MG tablet Take 1 tablet by mouth daily. Must keep upcoming appointment in May 2024 with Dr. Waddell before anymore refills. Final attempt. 90 tablet 3   metFORMIN  (GLUCOPHAGE -XR) 500 MG 24 hr tablet TAKE 1 TABLET(500 MG) BY MOUTH DAILY WITH BREAKFAST 90 tablet 0   metroNIDAZOLE  (METROGEL ) 0.75 % gel Apply 1  application topically 2 (two) times daily. For rosacea 45 g 1   NON FORMULARY Pt uses a cpap nightly     nystatin  ointment (MYCOSTATIN ) Apply 1 Application topically daily. (Patient taking differently: Apply 1 Application topically as needed.) 30 g 0   rivaroxaban  (XARELTO ) 20 MG TABS tablet TAKE 1 TABLET BY MOUTH DAILY WITH SUPPER 30 tablet 5   traMADol (ULTRAM) 50 MG tablet Take 50 mg by mouth every 12 (twelve) hours as needed.     vitamin B-12 (CYANOCOBALAMIN ) 1000 MCG tablet Take 1,000 mcg by mouth daily.     Zinc Oxide 30.6 % CREA Apply 1 application topically every morning.     No current facility-administered medications on file prior to visit.    Past Medical History:  Diagnosis Date   Anticoagulant long-term use    xarelto --- managed by dr g. taylor   Anxiety    Chronic pain of both shoulders    Dysrhythmia    Family history of adverse reaction to anesthesia    mother--- ponv   Heart murmur    History of anal fissures    History of infertility, female    History of kidney stones    Hyperlipidemia    Hypertension    IBS (irritable bowel syndrome)    Left ureteral stone    OA (osteoarthritis)    PAF (paroxysmal atrial fibrillation) (HCC)    PCO (polycystic ovaries)    PONV (postoperative nausea and vomiting)    RLS (restless legs syndrome)    Rosacea    Type 2 diabetes mellitus (HCC)    Allergies  Allergen Reactions   Ace Inhibitors Cough   Erythromycin  Ethylsuccinate     Stomach hurts REACTION: unspecified   can take azithromycin    Doxycycline  Rash   Latex Rash    Social History   Socioeconomic History   Marital status: Married    Spouse name: Not on file   Number of children: 0   Years of education: Not on file   Highest education level: Not on file  Occupational History   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: [employer]  Tobacco Use   Smoking status: Former    Current packs/day: 0.00    Average packs/day: 0.5 packs/day for 7.0  years (3.5 ttl pk-yrs)    Types: Cigarettes    Start date: 80    Quit date: 50    Years since quitting: 37.0   Smokeless tobacco: Never  Vaping Use   Vaping status: Never Used  Substance and Sexual Activity   Alcohol use: No    Alcohol/week: 0.0 standard drinks of alcohol   Drug use: No   Sexual activity: Not on file    Comment: married  Other Topics Concern   Not on file  Social History Narrative   Married   Pt doesn't get reg exercise   New pet puppy   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   Social Drivers of Corporate Investment Banker Strain:  Not on file  Food Insecurity: Not on file  Transportation Needs: Not on file  Physical Activity: Insufficiently Active (06/29/2023)   Exercise Vital Sign    Days of Exercise per Week: 7 days    Minutes of Exercise per Session: 10 min  Stress: Not on file  Social Connections: Not on file   Vitals:   12/21/23 1500  BP: 120/62  Pulse: 91  Resp: 16  Temp: 97.7 F (36.5 C)  SpO2: 95%   Body mass index is 38.17 kg/m.  Physical Exam Vitals and nursing note reviewed.  Constitutional:      General: She is not in acute distress.    Appearance: She is well-developed. She is not ill-appearing.  HENT:     Head: Atraumatic.     Right Ear: Tympanic membrane, ear canal and external ear normal.     Left Ear: Tympanic membrane, ear canal and external ear normal.     Nose: Congestion and rhinorrhea present.     Mouth/Throat:     Mouth: Mucous membranes are moist.     Pharynx: Oropharynx is clear. Uvula midline.  Eyes:     Conjunctiva/sclera: Conjunctivae normal.  Cardiovascular:     Rate and Rhythm: Normal rate and regular rhythm.     Heart sounds: No murmur heard. Pulmonary:     Effort: Pulmonary effort is normal. No respiratory distress.     Breath sounds: No stridor. Wheezing present. No rhonchi or rales.     Comments: Mild wheezing at  the end of forced expiration when having coughing spells. Lymphadenopathy:     Cervical: No cervical adenopathy.  Skin:    General: Skin is warm.     Findings: No erythema or rash.  Neurological:     General: No focal deficit present.     Mental Status: She is alert and oriented to person, place, and time.  Psychiatric:        Mood and Affect: Mood and affect normal.   ASSESSMENT AND PLAN:  Ms. Lahman was seen today for follow up.   Mild intermittent asthma with acute exacerbation On 12/06/23 she started Prednisone  x 5 d, Augmentin ,and Albuterol  inh. 12/12/23 seen for persistent cough, Hycodan was prescribed. 12/14/23 with flu like sx's and fever, home COVID 19 test positive. In general symptoms have improved, not longer having fever.  Mild wheezing appreciated today. Recommend starting Symbicort  160-4.5 mcg bid, rinse after use.  She would like to have prednisone  in case wheezing does not resolved, we discussed some side effects. Prednisone  taper to start if Symbicort  does not help. If symptoms get suddenly worse, she needs to seek immediate medical attention. Continue Albuterol  inh 1-2 puff q 4-6 hours as needed. Recommend using inhalers with spacer. Clearly instructed about warning signs.  -     Budesonide -Formoterol  Fumarate; Inhale 2 puffs into the lungs 2 (two) times daily.  Dispense: 1 each; Refill: 1 -     predniSONE ; 2 tabs for 3 days 1 tab for 3 days and 1/2 tab for 2 days and stop.  Dispense: 11 tablet; Refill: 0 -     Spacer/Aero-Holding Chambers; Inhale 1 Inhalation into the lungs 2 (two) times daily.  Dispense: 1 Device; Refill: 0 -     AeroChamber Plus Flo-Vu; Use bid with inhalers.  Dispense: 1 each; Refill: 0  Cough, unspecified type Explained cough and congestion can last weeks and even months after acute URI's and COVID 19 infection. Lung auscultation negative for rales or rhonchi, we could have  a CXR , we decided to wait on it. Continue Hycodan for cough  management. F/U in 3 weeks, before if needed.  Return in 3 weeks (on 01/11/2024), or if symptoms worsen or fail to improve, for asthma exacerbation with PCP.  I, Leonce PARAS Wierda, acting as a scribe for Jiaire Rosebrook, MD., have documented all relevant documentation on the behalf of Ashmi Blas, MD, as directed by  Jaquae Rieves, MD while in the presence of Nichola Warren, MD.   I, Ellowyn Rieves, MD, have reviewed all documentation for this visit. The documentation on 12/22/23 for the exam, diagnosis, procedures, and orders are all accurate and complete.  Elany Felix G. Arcola Freshour, MD  [facility]. [place] office.

## 2023-12-27 ENCOUNTER — Encounter: Payer: Self-pay | Admitting: Internal Medicine

## 2024-01-10 DIAGNOSIS — Z713 Dietary counseling and surveillance: Secondary | ICD-10-CM | POA: Diagnosis not present

## 2024-01-12 DIAGNOSIS — M65342 Trigger finger, left ring finger: Secondary | ICD-10-CM | POA: Diagnosis not present

## 2024-01-13 ENCOUNTER — Telehealth: Payer: Self-pay

## 2024-01-13 NOTE — Telephone Encounter (Signed)
Copied from CRM [id number]. Topic: Clinical - Medical Advice >> Jan 13, 2024  2:37 PM Jon Gills C wrote: Reason for CRM: Patient called in stated she is feeling much better, but didnt know if she wanted me to stay on it, or just wanted to know if she uses it when it is needed, on the medication  budesonide-formoterol (SYMBICORT) 160-4.5 MCG/ACT inhaler

## 2024-01-16 NOTE — Telephone Encounter (Signed)
Recommend continuing Symbicort for another month or 2 then she can continue prn. Thanks, BJ

## 2024-01-17 DIAGNOSIS — M65342 Trigger finger, left ring finger: Secondary | ICD-10-CM | POA: Diagnosis not present

## 2024-01-17 DIAGNOSIS — M65341 Trigger finger, right ring finger: Secondary | ICD-10-CM | POA: Diagnosis not present

## 2024-01-17 NOTE — Telephone Encounter (Signed)
Spoke to pt. Pt reports the medication is " wonderful drug for me."  She continues it is working well and taking it daily.   Pt verbalized understanding of provider's recommendation.

## 2024-02-14 DIAGNOSIS — M65341 Trigger finger, right ring finger: Secondary | ICD-10-CM | POA: Diagnosis not present

## 2024-02-18 ENCOUNTER — Other Ambulatory Visit: Payer: Self-pay | Admitting: Internal Medicine

## 2024-02-20 DIAGNOSIS — Z713 Dietary counseling and surveillance: Secondary | ICD-10-CM | POA: Diagnosis not present

## 2024-03-07 ENCOUNTER — Other Ambulatory Visit: Payer: Self-pay | Admitting: Internal Medicine

## 2024-03-07 DIAGNOSIS — J4521 Mild intermittent asthma with (acute) exacerbation: Secondary | ICD-10-CM

## 2024-03-07 NOTE — Telephone Encounter (Signed)
 Copied from CRM [id number]. Topic: Clinical - Medication Refill >> Mar 07, 2024  4:16 PM Adaysia C wrote: Most Recent Primary Care Visit:  Provider: [place], BETTY G  Department: [facility]  Visit Type: OFFICE VISIT  Date: 12/21/2023  Medication: budesonide-formoterol (SYMBICORT) 160-4.5 MCG/ACT inhaler  Has the patient contacted their pharmacy? Yes, patients pharmacy was showing 0 refills and told her to contact her provider for RX refill (Agent: If no, request that the patient contact the pharmacy for the refill. If patient does not wish to contact the pharmacy document the reason why and proceed with request.) (Agent: If yes, when and what did the pharmacy advise?)  Is this the correct pharmacy for this prescription? Yes If no, delete pharmacy and type the correct one.  This is the patient's preferred pharmacy:   [id number] [place], [place] - [address] AT [facility] [address] [place] [address] Phone: [phone] Fax: [phone]   Has the prescription been filled recently? No  Is the patient out of the medication? Yes  Has the patient been seen for an appointment in the last year OR does the patient have an upcoming appointment? Yes  Can we respond through MyChart? Yes  Agent: Please be advised that Rx refills may take up to 3 business days. We ask that you follow-up with your pharmacy.

## 2024-03-12 MED ORDER — BUDESONIDE-FORMOTEROL FUMARATE 160-4.5 MCG/ACT IN AERO
2.0000 | INHALATION_SPRAY | Freq: Two times a day (BID) | RESPIRATORY_TRACT | 1 refills | Status: DC
Start: 2024-03-12 — End: 2024-05-22

## 2024-03-16 ENCOUNTER — Other Ambulatory Visit: Payer: Self-pay | Admitting: Obstetrics and Gynecology

## 2024-03-16 DIAGNOSIS — Z1231 Encounter for screening mammogram for malignant neoplasm of breast: Secondary | ICD-10-CM

## 2024-03-23 ENCOUNTER — Ambulatory Visit

## 2024-03-29 ENCOUNTER — Ambulatory Visit
Admission: RE | Admit: 2024-03-29 | Discharge: 2024-03-29 | Disposition: A | Discharge status: Home / Self Care | Source: Ambulatory Visit | Attending: Obstetrics and Gynecology | Admitting: Obstetrics and Gynecology

## 2024-03-29 DIAGNOSIS — Z1231 Encounter for screening mammogram for malignant neoplasm of breast: Secondary | ICD-10-CM | POA: Diagnosis not present

## 2024-04-03 DIAGNOSIS — M65342 Trigger finger, left ring finger: Secondary | ICD-10-CM | POA: Diagnosis not present

## 2024-04-03 DIAGNOSIS — M65341 Trigger finger, right ring finger: Secondary | ICD-10-CM | POA: Diagnosis not present

## 2024-04-04 DIAGNOSIS — L710 Perioral dermatitis: Secondary | ICD-10-CM | POA: Diagnosis not present

## 2024-04-04 DIAGNOSIS — L718 Other rosacea: Secondary | ICD-10-CM | POA: Diagnosis not present

## 2024-04-11 ENCOUNTER — Ambulatory Visit: Admitting: Internal Medicine

## 2024-04-11 ENCOUNTER — Encounter: Payer: Self-pay | Admitting: Internal Medicine

## 2024-04-11 VITALS — BP 108/66 | HR 84 | Temp 98.1°F | Ht 59.0 in | Wt 191.6 lb

## 2024-04-11 DIAGNOSIS — L299 Pruritus, unspecified: Secondary | ICD-10-CM | POA: Diagnosis not present

## 2024-04-11 DIAGNOSIS — Z79899 Other long term (current) drug therapy: Secondary | ICD-10-CM | POA: Diagnosis not present

## 2024-04-11 DIAGNOSIS — G2581 Restless legs syndrome: Secondary | ICD-10-CM | POA: Diagnosis not present

## 2024-04-11 DIAGNOSIS — Z6379 Other stressful life events affecting family and household: Secondary | ICD-10-CM

## 2024-04-11 DIAGNOSIS — R202 Paresthesia of skin: Secondary | ICD-10-CM

## 2024-04-11 MED ORDER — HYDROXYZINE HCL 25 MG PO TABS: 25.0000 mg | ORAL_TABLET | Freq: Every evening | ORAL | 1 refills | Status: AC | PRN

## 2024-04-11 NOTE — Progress Notes (Signed)
 Chief Complaint  Patient presents with   Tingling    Pt c/o tingling and itching on bilateral legs. Going on for about a year now. Was seeing dermatology about the itch. Triamcinolone. Calm the itch down but not tingling. Tingling when standing up for short/long period. Pt c/o itching on L leg. Sometimes feel like have "pebbles" on L toes. To discuss if something to do neuropathy.    Insomnia    Pt c/o trouble with sleeping.     HPI: Gail [name] 58 y.o. come in for help with couple of problems seen by specialty care including ortho and derm  Tingling at night  legs  and sometimes   days.   Right   but has itching left leg foot ankle    Dr Murrell Arrant  has given her  gabapentin   for RLS  with some help but still have these sx of concern   feels like pebble in shoe under mid toes on r but no injury or rash hs been referred to rheumatology as she has hand arthritis and recurring trigger fingers requiring injections   Derm : has seen her for ? Rash and wants to do  ana   autoimmune work up.  Suggesting pcp can do   hands and trigger finger   and shots   . ENT  dry mouth  n past.  ? 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 Trouble sleeping with sx above    Past Medical History:  Diagnosis Date   Anticoagulant long-term use    xarelto --- managed by dr g. taylor   Anxiety    Chronic pain of both shoulders    Dysrhythmia    Family history of adverse reaction to anesthesia    mother--- ponv   Heart murmur    History of anal fissures    History of infertility, female    History of kidney stones    Hyperlipidemia    Hypertension     IBS (irritable bowel syndrome)    Left ureteral stone    OA (osteoarthritis)    PAF (paroxysmal atrial fibrillation) (HCC)    PCO (polycystic ovaries)    PONV (postoperative nausea and vomiting)    RLS (restless legs syndrome)    Rosacea    Type 2 diabetes mellitus (HCC)     Family History  Problem Relation Age of Onset   Hypertension Mother    Heart disease Mother    Hypertension Father    ALS Father        deceased   Dementia Maternal Grandfather    Heart disease Paternal Grandmother    Dementia Paternal Grandfather    Hypertension Other    Lung cancer Other        uncle   Breast cancer Other        great aunt   Colon cancer Neg Hx    Stomach cancer Neg Hx    Liver cancer Neg Hx    Rectal cancer Neg Hx    Esophageal cancer Neg Hx  Social History   Socioeconomic History   Marital status: Married    Spouse name: Not on file   Number of children: 0   Years of education: Not on file   Highest education level: Not on file  Occupational History   Occupation: preschool Magazine features editor: [employer]  Tobacco Use   Smoking status: Former    Current packs/day: 0.00    Average packs/day: 0.5 packs/day for 7.0 years (3.5 ttl pk-yrs)    Types: Cigarettes    Start date: 56    Quit date: 5    Years since quitting: 37.3   Smokeless tobacco: Never  Vaping Use   Vaping status: Never Used  Substance and Sexual Activity   Alcohol use: No    Alcohol/week: 0.0 standard drinks of alcohol   Drug use: No   Sexual activity: Not on file    Comment: married  Other Topics Concern   Not on file  Social History Narrative   Married   Pt doesn't get reg exercise   New pet puppy   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   Social Drivers of Corporate investment banker Strain: Not on file  Food Insecurity: Not on file  Transportation Needs: Not on file  Physical  Activity: Insufficiently Active (06/29/2023)   Exercise Vital Sign    Days of Exercise per Week: 7 days    Minutes of Exercise per Session: 10 min  Stress: Not on file  Social Connections: Not on file    Outpatient Medications Prior to Visit  Medication Sig Dispense Refill   albuterol  (VENTOLIN  HFA) 108 (90 Base) MCG/ACT inhaler INHALE 2 PUFFS INTO THE LUNGS EVERY 6 HOURS AS NEEDED FOR WHEEZING OR SHORTNESS OF BREATH 6.7 g 1   budesonide -formoterol  (SYMBICORT ) 160-4.5 MCG/ACT inhaler Inhale 2 puffs into the lungs 2 (two) times daily. (Patient taking differently: Inhale 2 puffs into the lungs 2 (two) times daily. As needed) 1 each 1   diltiazem  (CARDIZEM  CD) 360 MG 24 hr capsule Take 1 capsule by mouth daily. Please keep upcoming appointment in May 2024 with Dr. Carolynne Citron before any more refills. 90 capsule 2   fluticasone  (FLONASE ) 50 MCG/ACT nasal spray 2 sprays per nostril daily.  MUST HAVE OFFICE VISIT FOR FURTHER REFILLS. (Patient taking differently: as needed. 2 sprays per nostril daily.) 16 g 0   gabapentin  (NEURONTIN ) 300 MG capsule SMARTSIG:1 Capsule(s) By Mouth 1-3 Times Daily PRN     Lactobacillus (ACIDOPHILUS PO) Take 1 capsule by mouth at bedtime.     levonorgestrel  (MIRENA , 52 MG,) 20 MCG/24HR IUD 1 each by Intrauterine route once.     losartan  (COZAAR ) 50 MG tablet Take 1 tablet by mouth daily. Must keep upcoming appointment in May 2024 with Dr. Carolynne Citron before anymore refills. Final attempt. 90 tablet 3   metFORMIN  (GLUCOPHAGE -XR) 500 MG 24 hr tablet TAKE 1 TABLET(500 MG) BY MOUTH DAILY WITH BREAKFAST 90 tablet 0   metroNIDAZOLE  (METROCREAM ) 0.75 % cream Apply topically.     NON FORMULARY Pt uses a cpap nightly     nystatin  ointment (MYCOSTATIN ) Apply 1 Application topically daily. (Patient taking differently: Apply 1 Application topically as needed.) 30 g 0   rivaroxaban  (XARELTO ) 20 MG TABS tablet TAKE 1 TABLET BY MOUTH DAILY WITH SUPPER 30 tablet 5   traMADol (ULTRAM) 50  MG tablet  Take 50 mg by mouth every 12 (twelve) hours as needed.     vitamin B-12 (CYANOCOBALAMIN ) 1000 MCG tablet Take 1,000 mcg by mouth daily.     Zinc Oxide 30.6 % CREA Apply 1 application topically every morning.     HYDROcodone  bit-homatropine (HYCODAN) 5-1.5 MG/5ML syrup Take 5 mLs by mouth every 6 (six) hours as needed for cough. (Patient not taking: Reported on 04/11/2024) 120 mL 0   Spacer/Aero-Holding Chambers (AEROCHAMBER PLUS) Device Use bid with inhalers. 1 each 0   Spacer/Aero-Holding Chambers DEVI Inhale 1 Inhalation into the lungs 2 (two) times daily. 1 Device 0   metroNIDAZOLE  (METROGEL ) 0.75 % gel Apply 1 application topically 2 (two) times daily. For rosacea (Patient not taking: Reported on 04/11/2024) 45 g 1   No facility-administered medications prior to visit.     EXAM:  BP 108/66 (BP Location: Left Arm, Patient Position: Sitting, Cuff Size: Large)   Pulse 84   Temp 98.1 F (36.7 C) (Oral)   Ht 4\' 11"  (1.499 m)   Wt 191 lb 9.6 oz (86.9 kg)   SpO2 98%   BMI 38.70 kg/m   Body mass index is 38.7 kg/m.  GENERAL: vitals reviewed and listed above, alert, oriented, appears well hydrated and in no acute distress some facial cheek flush  HEENT: atraumatic, conjunctiva  clear, no obvious abnormalities on inspection of external nose and ears NECK: no obvious masses on inspection palpation  LUNGS: clear to auscultation bilaterally, no wheezes, rales or rhonchi, good air movement CV: HRRR, no clubbing cyanosis or  peripheral edema nl cap refill  MS: moves all extremities without noticeable focal  abnormality no spcific abnormality toes  symmetrical  no atrophy  no lesion  PSYCH: pleasant and cooperative, no obvious depression or anxiety Lab Results  Component Value Date   WBC 4.6 12/27/2022   HGB 13.8 12/27/2022   HCT 41.2 12/27/2022   PLT 221.0 12/27/2022   GLUCOSE 103 (H) 12/27/2022   CHOL 165 12/27/2022   TRIG 48.0 12/27/2022   HDL 51.00 12/27/2022   LDLDIRECT 160.7  12/10/2010   LDLCALC 105 (H) 12/27/2022   ALT 15 12/27/2022   AST 17 12/27/2022   NA 140 12/27/2022   K 4.0 12/27/2022   CL 106 12/27/2022   CREATININE 0.74 12/27/2022   BUN 13 12/27/2022   CO2 26 12/27/2022   TSH 2.020 06/16/2022   HGBA1C 5.8 (A) 06/29/2023   BP Readings from Last 3 Encounters:  04/11/24 108/66  12/21/23 120/62  12/12/23 128/82   Lab Results  Component Value Date   VITAMINB12 1,375 (H) 12/27/2022     ASSESSMENT AND PLAN:  Discussed the following assessment and plan:  Tingling in extremities - Plan: Vitamin B12, Basic metabolic panel with GFR, CBC with Differential/Platelet, Hemoglobin A1c, Hepatic function panel, Lipid panel, TSH, Microalbumin / creatinine urine ratio, ANA, C-reactive protein, Sedimentation rate, Cyclic citrul peptide antibody, IgG, Sjogren's syndrome antibods(ssa + ssb), Iron, TIBC and Ferritin Panel, T4, Free  Itching - agree could be neurobased itchiing - Plan: Vitamin B12, Basic metabolic panel with GFR, CBC with Differential/Platelet, Hemoglobin A1c, Hepatic function panel, Lipid panel, TSH, Microalbumin / creatinine urine ratio, ANA, C-reactive protein, Sedimentation rate, Cyclic citrul peptide antibody, IgG, Sjogren's syndrome antibods(ssa + ssb), Iron, TIBC and Ferritin Panel  RLS (restless legs syndrome) - Plan: Vitamin B12, Basic metabolic panel with GFR, CBC with Differential/Platelet, Hemoglobin A1c, Hepatic function panel, Lipid panel, TSH, Microalbumin / creatinine urine ratio, ANA, C-reactive protein,  Sedimentation rate, Cyclic citrul peptide antibody, IgG, Sjogren's syndrome antibods(ssa + ssb), Iron, TIBC and Ferritin Panel, T4, Free  Medication management - Plan: Vitamin B12, Basic metabolic panel with GFR, CBC with Differential/Platelet, Hemoglobin A1c, Hepatic function panel, Lipid panel, TSH, Microalbumin / creatinine urine ratio, ANA, C-reactive protein, Sedimentation rate, Cyclic citrul peptide antibody, IgG, Sjogren's  syndrome antibods(ssa + ssb), Iron, TIBC and Ferritin Panel, T4, Free  Stress due to illness of family member  Disc updated lab   inflammatory markers   Patient concern about cost of referrals  understandably.  Do cpe pv panel and rheum screening  consider neuro consult  I am willing to try   changing to pregabalin if appropriate.  Can try hydroxyzine at night  for comfort in interim  Plan fu after all back can do virtual visit to decide on next step . -Patient advised to return or notify health care team  if  new concerns arise.  Patient Instructions  Lets update lab today. As planned . Then decide on what could be helpful.  Can try  hydroxyzine   at night for now  for itching .   Consider pregabalin  instead of gabapentin  but that is usually taken twice a day at different dosing and is a controlled substance .   Plan fu after all lab back  to make a plan can do virtual appt  at that time                Etosha Wetherell K. Naquita Nappier M.D.

## 2024-04-11 NOTE — Patient Instructions (Addendum)
 Lets update lab today. As planned . Then decide on what could be helpful.  Can try  hydroxyzine   at night for now  for itching .   Consider pregabalin  instead of gabapentin  but that is usually taken twice a day at different dosing and is a controlled substance .   Plan fu after all lab back  to make a plan can do virtual appt  at that time

## 2024-04-12 LAB — LIPID PANEL
Cholesterol: 225 mg/dL — ABNORMAL HIGH (ref 0–200)
HDL: 60.6 mg/dL
LDL Cholesterol: 144 mg/dL — ABNORMAL HIGH (ref 0–99)
NonHDL: 164.7
Total CHOL/HDL Ratio: 4
Triglycerides: 103 mg/dL (ref 0.0–149.0)
VLDL: 20.6 mg/dL (ref 0.0–40.0)

## 2024-04-12 LAB — BASIC METABOLIC PANEL WITH GFR
BUN: 20 mg/dL (ref 6–23)
CO2: 29 meq/L (ref 19–32)
Calcium: 9.8 mg/dL (ref 8.4–10.5)
Chloride: 100 meq/L (ref 96–112)
Creatinine, Ser: 0.87 mg/dL (ref 0.40–1.20)
GFR: 74.9 mL/min (ref >60.00)
Glucose, Bld: 103 mg/dL — ABNORMAL HIGH (ref 70–99)
Potassium: 4.2 meq/L (ref 3.5–5.1)
Sodium: 139 meq/L (ref 135–145)

## 2024-04-12 LAB — CBC WITH DIFFERENTIAL/PLATELET
Basophils Absolute: 0.1 K/uL (ref 0.0–0.1)
Basophils Relative: 0.9 % (ref 0.0–3.0)
Eosinophils Absolute: 0.1 K/uL (ref 0.0–0.7)
Eosinophils Relative: 1.5 % (ref 0.0–5.0)
HCT: 44.9 % (ref 36.0–46.0)
Hemoglobin: 15.1 g/dL — ABNORMAL HIGH (ref 12.0–15.0)
Lymphocytes Relative: 27.8 % (ref 12.0–46.0)
Lymphs Abs: 2.2 K/uL (ref 0.7–4.0)
MCHC: 33.6 g/dL (ref 30.0–36.0)
MCV: 93.9 fl (ref 78.0–100.0)
Monocytes Absolute: 0.8 K/uL (ref 0.1–1.0)
Monocytes Relative: 10 % (ref 3.0–12.0)
Neutro Abs: 4.7 K/uL (ref 1.4–7.7)
Neutrophils Relative %: 59.8 % (ref 43.0–77.0)
Platelets: 257 K/uL (ref 150.0–400.0)
RBC: 4.78 Mil/uL (ref 3.87–5.11)
RDW: 13.5 % (ref 11.5–15.5)
WBC: 7.9 K/uL (ref 4.0–10.5)

## 2024-04-12 LAB — HEPATIC FUNCTION PANEL
ALT: 26 U/L (ref 0–35)
AST: 21 U/L (ref 0–37)
Albumin: 4.5 g/dL (ref 3.5–5.2)
Alkaline Phosphatase: 99 U/L (ref 39–117)
Bilirubin, Direct: 0 mg/dL (ref 0.0–0.3)
Total Bilirubin: 0.4 mg/dL (ref 0.2–1.2)
Total Protein: 7.6 g/dL (ref 6.0–8.3)

## 2024-04-12 LAB — TSH: TSH: 1.84 u[IU]/mL (ref 0.35–5.50)

## 2024-04-12 LAB — HEMOGLOBIN A1C: Hgb A1c MFr Bld: 6.3 % (ref 4.6–6.5)

## 2024-04-12 LAB — T4, FREE: Free T4: 0.83 ng/dL (ref 0.60–1.60)

## 2024-04-12 LAB — C-REACTIVE PROTEIN: CRP: 1 mg/dL (ref 0.5–20.0)

## 2024-04-12 LAB — VITAMIN B12: Vitamin B-12: 663 pg/mL (ref 211–911)

## 2024-04-12 LAB — SEDIMENTATION RATE: Sed Rate: 33 mm/h — ABNORMAL HIGH (ref 0–30)

## 2024-04-14 LAB — IRON,TIBC AND FERRITIN PANEL
%SAT: 22 % (ref 16–45)
Ferritin: 101 ng/mL (ref 16–232)
Iron: 76 ug/dL (ref 45–160)
TIBC: 339 ug/dL (ref 250–450)

## 2024-04-14 LAB — SJOGREN'S SYNDROME ANTIBODS(SSA + SSB)
SSA (Ro) (ENA) Antibody, IgG: <1 AI
SSB (La) (ENA) Antibody, IgG: <1 AI

## 2024-04-14 LAB — CYCLIC CITRUL PEPTIDE ANTIBODY, IGG: Cyclic Citrullin Peptide Ab: <16 U

## 2024-04-14 LAB — ANTI-NUCLEAR AB-TITER (ANA TITER): ANA Titer 1: 1:80 {titer} — ABNORMAL HIGH

## 2024-04-14 LAB — ANA: Anti Nuclear Antibody (ANA): POSITIVE — AB

## 2024-04-16 ENCOUNTER — Encounter: Payer: Self-pay | Admitting: Internal Medicine

## 2024-04-16 NOTE — Progress Notes (Signed)
 Labs  show borderline elevated ana   that is not diagnostic of autoimmune but cannot rule out  because not negative    A1c in pre diabetic range but no diabetes.  Kidney function  is normal .  Cholesterol is  higher than in past  Thyroid  and liver are normal range .  Inflammation markers  crp normal  reassuring  esr borderline  could be  temporary up and not clinically significant  your could see the rheumatologist   but  no compelling abnormal lab tests .

## 2024-04-17 ENCOUNTER — Encounter: Payer: Self-pay | Admitting: Internal Medicine

## 2024-04-17 ENCOUNTER — Telehealth (INDEPENDENT_AMBULATORY_CARE_PROVIDER_SITE_OTHER): Admitting: Internal Medicine

## 2024-04-17 DIAGNOSIS — E785 Hyperlipidemia, unspecified: Secondary | ICD-10-CM | POA: Diagnosis not present

## 2024-04-17 DIAGNOSIS — R7303 Prediabetes: Secondary | ICD-10-CM | POA: Diagnosis not present

## 2024-04-17 DIAGNOSIS — R202 Paresthesia of skin: Secondary | ICD-10-CM

## 2024-04-17 DIAGNOSIS — R768 Other specified abnormal immunological findings in serum: Secondary | ICD-10-CM | POA: Diagnosis not present

## 2024-04-17 DIAGNOSIS — Z79899 Other long term (current) drug therapy: Secondary | ICD-10-CM

## 2024-04-17 NOTE — Progress Notes (Signed)
 Virtual Visit via Video Note  I connected with Gail [name] on 04/17/24 at  4:00 PM EDT by a video enabled telemedicine application and verified that I am speaking with the correct person using two identifiers. 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 and agreed to proceed.   HPI: Gail [name] presents for video visit fu   labs  she has reviewed  also  No new sx  hasn't tried the hydroxyzine  yet .  Minor cough could be related to weeather allergy and or child exposures no fever wheezing at this time can use the symbicort  /  ROS: See pertinent positives and negatives per HPI.  Past Medical History:  Diagnosis Date   Anticoagulant long-term use    xarelto --- managed by dr g. taylor   Anxiety    Chronic pain of both shoulders    Dysrhythmia    Family history of adverse reaction to anesthesia    mother--- ponv   Heart murmur    History of anal fissures    History of infertility, female    History of kidney stones    Hyperlipidemia    Hypertension    IBS (irritable bowel syndrome)    Left ureteral stone    OA (osteoarthritis)    PAF (paroxysmal atrial fibrillation) (HCC)    PCO (polycystic ovaries)    PONV (postoperative nausea and vomiting)    RLS (restless legs syndrome)    Rosacea    Type 2 diabetes mellitus (HCC)     Past Surgical History:  Procedure Laterality Date   A-FLUTTER ABLATION N/A 07/16/2019   Procedure: A-FLUTTER ABLATION;  Surgeon: Tammie Fall, MD;  Location: [facility];  Service: Cardiovascular;  Laterality: N/A;   ATRIAL TACH ABLATION N/A 07/16/2019   Procedure: ATRIAL TACH ABLATION;  Surgeon: Tammie Fall, MD;  Location: [facility];  Service: Cardiovascular;  Laterality: N/A;   CARPAL TUNNEL RELEASE  Bilateral 2006   HAND TENDON SURGERY Bilateral    bilateral thumb's   INTRAUTERINE DEVICE (IUD) INSERTION     KNEE ARTHROSCOPY Left 07/08/2010   @[facility]  by dr graves   SHOULDER ARTHROSCOPY WITH ROTATOR CUFF REPAIR AND OPEN BICEPS TENODESIS Left 12/21/2021   @[facility]    WISDOM TOOTH EXTRACTION      Family History  Problem Relation Age of Onset   Hypertension Mother    Heart disease Mother    Hypertension Father    ALS Father        deceased   Dementia Maternal Grandfather    Heart disease Paternal Grandmother    Dementia Paternal Grandfather    Hypertension Other    Lung cancer Other        uncle   Breast cancer Other        great aunt   Colon cancer Neg Hx    Stomach cancer Neg Hx    Liver cancer Neg Hx    Rectal cancer Neg Hx    Esophageal cancer Neg Hx     Social History   Tobacco Use   Smoking status: Former    Current packs/day: 0.00    Average packs/day: 0.5 packs/day for 7.0 years (3.5 ttl pk-yrs)    Types: Cigarettes    Start date: 25    Quit date: 1988  Years since quitting: 37.3   Smokeless tobacco: Never  Vaping Use   Vaping status: Never Used  Substance Use Topics   Alcohol use: No    Alcohol/week: 0.0 standard drinks of alcohol   Drug use: No      Current Outpatient Medications:    albuterol  (VENTOLIN  HFA) 108 (90 Base) MCG/ACT inhaler, INHALE 2 PUFFS INTO THE LUNGS EVERY 6 HOURS AS NEEDED FOR WHEEZING OR SHORTNESS OF BREATH, Disp: 6.7 g, Rfl: 1   budesonide -formoterol  (SYMBICORT ) 160-4.5 MCG/ACT inhaler, Inhale 2 puffs into the lungs 2 (two) times daily. (Patient taking differently: Inhale 2 puffs into the lungs 2 (two) times daily. As needed), Disp: 1 each, Rfl: 1   diltiazem  (CARDIZEM  CD) 360 MG 24 hr capsule, Take 1 capsule by mouth daily. Please keep upcoming appointment in May 2024 with Dr. Carolynne Citron before any more refills., Disp: 90 capsule, Rfl: 2   fluticasone  (FLONASE ) 50 MCG/ACT nasal spray, 2 sprays per nostril daily.  MUST HAVE OFFICE  VISIT FOR FURTHER REFILLS. (Patient taking differently: as needed. 2 sprays per nostril daily.), Disp: 16 g, Rfl: 0   gabapentin  (NEURONTIN ) 300 MG capsule, SMARTSIG:1 Capsule(s) By Mouth 1-3 Times Daily PRN, Disp: , Rfl:    hydrOXYzine  (ATARAX ) 25 MG tablet, Take 1 tablet (25 mg total) by mouth at bedtime as needed., Disp: 30 tablet, Rfl: 1   Lactobacillus (ACIDOPHILUS PO), Take 1 capsule by mouth at bedtime., Disp: , Rfl:    levonorgestrel  (MIRENA , 52 MG,) 20 MCG/24HR IUD, 1 each by Intrauterine route once., Disp: , Rfl:    losartan  (COZAAR ) 50 MG tablet, Take 1 tablet by mouth daily. Must keep upcoming appointment in May 2024 with Dr. Carolynne Citron before anymore refills. Final attempt., Disp: 90 tablet, Rfl: 3   metFORMIN  (GLUCOPHAGE -XR) 500 MG 24 hr tablet, TAKE 1 TABLET(500 MG) BY MOUTH DAILY WITH BREAKFAST, Disp: 90 tablet, Rfl: 0   metroNIDAZOLE  (METROCREAM ) 0.75 % cream, Apply topically., Disp: , Rfl:    NON FORMULARY, Pt uses a cpap nightly, Disp: , Rfl:    nystatin  ointment (MYCOSTATIN ), Apply 1 Application topically daily. (Patient taking differently: Apply 1 Application topically as needed.), Disp: 30 g, Rfl: 0   rivaroxaban  (XARELTO ) 20 MG TABS tablet, TAKE 1 TABLET BY MOUTH DAILY WITH SUPPER, Disp: 30 tablet, Rfl: 5   Spacer/Aero-Holding Chambers (AEROCHAMBER PLUS) Device, Use bid with inhalers., Disp: 1 each, Rfl: 0   Spacer/Aero-Holding Chambers DEVI, Inhale 1 Inhalation into the lungs 2 (two) times daily., Disp: 1 Device, Rfl: 0   traMADol (ULTRAM) 50 MG tablet, Take 50 mg by mouth every 12 (twelve) hours as needed., Disp: , Rfl:    vitamin B-12 (CYANOCOBALAMIN ) 1000 MCG tablet, Take 1,000 mcg by mouth daily., Disp: , Rfl:    Zinc Oxide 30.6 % CREA, Apply 1 application topically every morning., Disp: , Rfl:    HYDROcodone  bit-homatropine (HYCODAN) 5-1.5 MG/5ML syrup, Take 5 mLs by mouth every 6 (six) hours as needed for cough. (Patient not taking: Reported on 04/17/2024), Disp: 120 mL, Rfl:  0  EXAM: BP Readings from Last 3 Encounters:  04/11/24 108/66  12/21/23 120/62  12/12/23 128/82    VITALS per patient if applicable:  GENERAL: alert, oriented, appears well and in no acute distress  HEENT: atraumatic, conjunttiva clear, no obvious abnormalities on inspection of external nose and ears  NECK: normal movements of the head and neck  LUNGS: on inspection no signs of respiratory distress, breathing rate appears normal, no obvious gross SOB, gasping  or wheezing  CV: no obvious cyanosis  MS: moves all visible extremities without noticeable abnormality.  PSYCH/NEURO: pleasant and cooperative, no obvious depression or anxiety, speech and thought processing grossly intact Lab Results  Component Value Date   WBC 7.9 04/11/2024   HGB 15.1 (H) 04/11/2024   HCT 44.9 04/11/2024   PLT 257.0 04/11/2024   GLUCOSE 103 (H) 04/11/2024   CHOL 225 (H) 04/11/2024   TRIG 103.0 04/11/2024   HDL 60.60 04/11/2024   LDLDIRECT 160.7 12/10/2010   LDLCALC 144 (H) 04/11/2024   ALT 26 04/11/2024   AST 21 04/11/2024   NA 139 04/11/2024   K 4.2 04/11/2024   CL 100 04/11/2024   CREATININE 0.87 04/11/2024   BUN 20 04/11/2024   CO2 29 04/11/2024   TSH 1.84 04/11/2024   HGBA1C 6.3 04/11/2024  Lab reviewed   with patient   ASSESSMENT AND PLAN:     ICD-10-CM   1. Tingling in extremities  R20.2 Lipid panel    Basic metabolic panel with GFR    Hemoglobin A1c   esp when weight bearing  standing  not sitting    2. Hyperlipidemia, unspecified hyperlipidemia type  E78.5 Lipid panel    Basic metabolic panel with GFR    Hemoglobin A1c    3. Positive ANA (antinuclear antibody)  R76.8     4. Prediabetes  R73.03 Lipid panel    Basic metabolic panel with GFR    Hemoglobin A1c    5. Medication management  Z79.899 Lipid panel    Basic metabolic panel with GFR    Hemoglobin A1c    Discussed the following assessment and plan:  Borderline serology testing   with sx she has would proceed  with rheumatology consult   at this time stay on gabapentin  instead of lyrica for now.   Since sx wax and wane.  Using inhaler controller in season since cough off and on ( child exposures and weather  allergy)   Lipids  not as favorable  as last year she attributes to life style diet and plans on changing  and trying lsi and fu flp in about 6 mos or as indicated   Counseled.   Expectant management and discussion of plan and treatment with opportunity to ask questions and all were answered. The patient agreed with the plan and demonstrated an understanding of the instructions.   Advised to call back or seek an in-person evaluation if worsening  or having  further concerns  in interim. Return in about 6 months (around 10/18/2024) for lab lipid a1c bmp.   Daphine Eagle, MD

## 2024-04-18 ENCOUNTER — Other Ambulatory Visit: Payer: Self-pay | Admitting: Internal Medicine

## 2024-04-18 DIAGNOSIS — I480 Paroxysmal atrial fibrillation: Secondary | ICD-10-CM

## 2024-05-15 ENCOUNTER — Telehealth: Payer: Self-pay | Admitting: Internal Medicine

## 2024-05-15 DIAGNOSIS — R768 Other specified abnormal immunological findings in serum: Secondary | ICD-10-CM

## 2024-05-15 NOTE — Telephone Encounter (Unsigned)
 Copied from CRM [id number]. Topic: Referral - Request for Referral >> May 15, 2024  2:33 PM Chuck Crater wrote: Did the patient discuss referral with their provider in the last year? Yes (If No - schedule appointment) (If Yes - send message)  Appointment offered? No  Type of order/referral and detailed reason for visit: referral to Rheumatology/  Positive ANA Test and other abnormal test   Preference of office, provider, location: Dr. Duke Gibbons  [facility]  If referral order, have you been seen by this specialty before? No (If Yes, this issue or another issue? When? Where?  Can we respond through MyChart? Yes

## 2024-05-16 NOTE — Telephone Encounter (Signed)
 Pt had a follow up lab visit with provider on 04/17/2024: Below is note from the visit:  "Borderline serology testing with sx she has would proceed with rheumatology consult at this time stay on gabapentin  instead of lyrica for now. Since sx wax and wane. "  Referral is placed. Pt is contacted.

## 2024-05-20 ENCOUNTER — Other Ambulatory Visit: Payer: Self-pay | Admitting: Internal Medicine

## 2024-05-20 DIAGNOSIS — I480 Paroxysmal atrial fibrillation: Secondary | ICD-10-CM

## 2024-05-21 NOTE — Telephone Encounter (Signed)
 Prescription refill request for Xarelto  received.  Indication:afib Last office visit:upcoming Weight:86.9  kg Age:58 Scr:0.87  4/25 CrCl:100.23  ml/min  Prescription refilled

## 2024-05-22 ENCOUNTER — Other Ambulatory Visit: Payer: Self-pay | Admitting: Internal Medicine

## 2024-05-22 DIAGNOSIS — J4521 Mild intermittent asthma with (acute) exacerbation: Secondary | ICD-10-CM

## 2024-05-23 ENCOUNTER — Other Ambulatory Visit: Payer: Self-pay | Admitting: Internal Medicine

## 2024-05-23 DIAGNOSIS — I480 Paroxysmal atrial fibrillation: Secondary | ICD-10-CM

## 2024-06-19 ENCOUNTER — Telehealth: Payer: Self-pay

## 2024-06-19 NOTE — Telephone Encounter (Signed)
 Copied from CRM [id number]. Topic: Clinical - Medical Advice >> Jun 19, 2024  2:11 PM Thersia C wrote: Reason for CRM: Patient called in stated she had had 3 cases of strep, was around once child on thursday , throat is hurting alil bit not a lot, Wanted to know if Dr.Panosh could call in a prescription, or if she can has to come she has been busy    [employer] [phone] [place], [place] - [address] AT [facility] [address] [facility] [address] [place] [address] Phone: [phone] Fax: [phone]

## 2024-06-19 NOTE — Telephone Encounter (Signed)
 Attempted to reach pt. Left detail message to schedule an appointment to be evaluate by one of our providers.

## 2024-06-21 NOTE — Telephone Encounter (Signed)
 Spoke to pt. Pt states she is good and if feels she needs to be evaluated she will call to schedule an appt.

## 2024-06-28 ENCOUNTER — Ambulatory Visit: Attending: Internal Medicine | Admitting: Internal Medicine

## 2024-06-28 ENCOUNTER — Encounter: Payer: Self-pay | Admitting: Internal Medicine

## 2024-06-28 VITALS — BP 132/79 | HR 55 | Ht 59.0 in

## 2024-06-28 DIAGNOSIS — I480 Paroxysmal atrial fibrillation: Secondary | ICD-10-CM | POA: Diagnosis not present

## 2024-06-28 DIAGNOSIS — I10 Essential (primary) hypertension: Secondary | ICD-10-CM | POA: Diagnosis not present

## 2024-06-28 DIAGNOSIS — M65341 Trigger finger, right ring finger: Secondary | ICD-10-CM | POA: Diagnosis not present

## 2024-06-28 DIAGNOSIS — M65342 Trigger finger, left ring finger: Secondary | ICD-10-CM | POA: Diagnosis not present

## 2024-06-28 NOTE — Patient Instructions (Addendum)
 Medication Instructions:  Your physician recommends that you continue on your current medications as directed. Please refer to the Current Medication list given to you today.  *If you need a refill on your cardiac medications before your next appointment, please call your pharmacy*  Lab Work: None ordered.  You may go to any [facility] Location for your lab work:  [facility] - [address] ([facility]) - [address] [phone] [address]  [place] - [address]   [place]  - [address]   [place] - [address] - [address] [employer]  - [address] [place] - [address] ([facility]   If you have labs (blood work) drawn today and your tests are completely normal, you will receive your results only by: [employer] (if you have MyChart)  If you have any lab test that is abnormal or we need to change your treatment, we will call you or send a MyChart message to review the results.  Testing/Procedures: CT scanning for a cardiac calcium score (CAT scanning), is a noninvasive, special x-ray that produces cross-sectional images of the body using x-rays and a computer. CT scans help physicians diagnose and treat medical conditions. For some CT exams, a contrast material is used to enhance visibility in the area of the body being studied. CT scans provide greater clarity and reveal more details than regular x-ray exams.   Follow-Up: At [facility], you and your health needs are our priority.  As part of our continuing mission to provide you with exceptional heart care, we have created designated Provider Care Teams.  These Care Teams include your primary Cardiologist (physician) and Advanced Practice Providers (APPs -  Physician Assistants and Nurse Practitioners) who all work together to provide you with the care you need, when you need it.   Your next  appointment:   1 year(s)  The format for your next appointment:   In Person  Provider:   Danelle Birmingham, MD{or one of the following Advanced Practice Providers on your designated Care Team:   Charlies Arthur, [place] Ozell Jodie Passey, [place] Leotis Barrack, NP  Note: Remote monitoring is used to monitor your Pacemaker/ ICD from home. This monitoring reduces the number of office visits required to check your device to one time per year. It allows us  to keep an eye on the functioning of your device to ensure it is working properly.

## 2024-06-28 NOTE — Progress Notes (Unsigned)
 HPI Mrs. Gail [name] returns today for followup. She is a pleasant obese 58 yo woman with HTN, atrial flutter who underwent EP study and catheter ablation. She has had brief episodes of atrial fib since her ablation. She has a cardiac monitor and this has demonstrated brief episodes of atrial fib lasting minutes. She denies chest pain but does have mild dyspnea at baseline.  No edema. She goes out of rhythm for 2-3 minutes at a time and feels palpitations lasting minutes. Her bp's have been controlled. Since I saw her last she does not think that her symptoms have progressed. She has been under personal stress and admits to dietary indiscretion. Allergies  Allergen Reactions   Ace Inhibitors Cough   Erythromycin  Ethylsuccinate     Stomach hurts REACTION: unspecified   can take azithromycin    Doxycycline  Rash   Latex Rash     Current Outpatient Medications  Medication Sig Dispense Refill   albuterol  (VENTOLIN  HFA) 108 (90 Base) MCG/ACT inhaler INHALE 2 PUFFS INTO THE LUNGS EVERY 6 HOURS AS NEEDED FOR WHEEZING OR SHORTNESS OF BREATH 6.7 g 1   budesonide -formoterol  (SYMBICORT ) 160-4.5 MCG/ACT inhaler INHALE 2 PUFFS INTO THE LUNGS TWICE DAILY 10.2 g 0   diltiazem  (CARDIZEM  CD) 360 MG 24 hr capsule Take 1 capsule by mouth daily. Please keep upcoming appointment in May 2024 with Dr. Waddell before any more refills. 90 capsule 1   fluticasone  (FLONASE ) 50 MCG/ACT nasal spray 2 sprays per nostril daily.  MUST HAVE OFFICE VISIT FOR FURTHER REFILLS. (Patient taking differently: as needed. 2 sprays per nostril daily.) 16 g 0   gabapentin  (NEURONTIN ) 300 MG capsule SMARTSIG:1 Capsule(s) By Mouth 1-3 Times Daily PRN     hydrOXYzine  (ATARAX ) 25 MG tablet Take 1 tablet (25 mg total) by mouth at bedtime as needed. 30 tablet 1   Lactobacillus (ACIDOPHILUS PO) Take 1 capsule by mouth at bedtime.     levonorgestrel  (MIRENA , 52 MG,) 20 MCG/24HR IUD 1 each by Intrauterine route once.     losartan  (COZAAR ) 50  MG tablet Take 1 tablet by mouth daily. Must keep upcoming appointment in May 2024 with Dr. Waddell before anymore refills. Final attempt. 90 tablet 3   metFORMIN  (GLUCOPHAGE -XR) 500 MG 24 hr tablet TAKE 1 TABLET(500 MG) BY MOUTH DAILY WITH BREAKFAST 90 tablet 0   metroNIDAZOLE  (METROCREAM ) 0.75 % cream Apply topically.     NON FORMULARY Pt uses a cpap nightly     nystatin  ointment (MYCOSTATIN ) Apply 1 Application topically daily. (Patient taking differently: Apply 1 Application topically as needed.) 30 g 0   Spacer/Aero-Holding Chambers (AEROCHAMBER PLUS) Device Use bid with inhalers. 1 each 0   Spacer/Aero-Holding Chambers DEVI Inhale 1 Inhalation into the lungs 2 (two) times daily. 1 Device 0   traMADol (ULTRAM) 50 MG tablet Take 50 mg by mouth every 12 (twelve) hours as needed.     vitamin B-12 (CYANOCOBALAMIN ) 1000 MCG tablet Take 1,000 mcg by mouth daily.     XARELTO  20 MG TABS tablet TAKE 1 TABLET BY MOUTH DAILY WITH SUPPER 30 tablet 5   Zinc Oxide 30.6 % CREA Apply 1 application topically every morning.     HYDROcodone  bit-homatropine (HYCODAN) 5-1.5 MG/5ML syrup Take 5 mLs by mouth every 6 (six) hours as needed for cough. (Patient not taking: Reported on 06/28/2024) 120 mL 0   No current facility-administered medications for this visit.     Past Medical History:  Diagnosis Date   Anticoagulant long-term use  xarelto --- managed by dr g. Marlis Oldaker   Anxiety    Chronic pain of both shoulders    Dysrhythmia    Family history of adverse reaction to anesthesia    mother--- ponv   Heart murmur    History of anal fissures    History of infertility, female    History of kidney stones    Hyperlipidemia    Hypertension    IBS (irritable bowel syndrome)    Left ureteral stone    OA (osteoarthritis)    PAF (paroxysmal atrial fibrillation) (HCC)    PCO (polycystic ovaries)    PONV (postoperative nausea and vomiting)    RLS (restless legs syndrome)    Rosacea    Type 2 diabetes  mellitus (HCC)     ROS:   All systems reviewed and negative except as noted in the HPI.   Past Surgical History:  Procedure Laterality Date   A-FLUTTER ABLATION N/A 07/16/2019   Procedure: A-FLUTTER ABLATION;  Surgeon: Waddell Danelle ORN, MD;  Location: [facility] INVASIVE CV LAB;  Service: Cardiovascular;  Laterality: N/A;   ATRIAL TACH ABLATION N/A 07/16/2019   Procedure: ATRIAL TACH ABLATION;  Surgeon: Waddell Danelle ORN, MD;  Location: [facility] INVASIVE CV LAB;  Service: Cardiovascular;  Laterality: N/A;   CARPAL TUNNEL RELEASE Bilateral 2006   HAND TENDON SURGERY Bilateral    bilateral thumb's   INTRAUTERINE DEVICE (IUD) INSERTION     KNEE ARTHROSCOPY Left 07/08/2010   @[facility]  by dr yvone   SHOULDER ARTHROSCOPY WITH ROTATOR CUFF REPAIR AND OPEN BICEPS TENODESIS Left 12/21/2021   @[facility]    WISDOM TOOTH EXTRACTION       Family History  Problem Relation Age of Onset   Hypertension Mother    Heart disease Mother    Hypertension Father    ALS Father        deceased   Dementia Maternal Grandfather    Heart disease Paternal Grandmother    Dementia Paternal Grandfather    Hypertension Other    Lung cancer Other        uncle   Breast cancer Other        great aunt   Colon cancer Neg Hx    Stomach cancer Neg Hx    Liver cancer Neg Hx    Rectal cancer Neg Hx    Esophageal cancer Neg Hx      Social History   Socioeconomic History   Marital status: Married    Spouse name: Not on file   Number of children: 0   Years of education: Not on file   Highest education level: Not on file  Occupational History   Occupation: preschool Magazine features editor: [employer]  Tobacco Use   Smoking status: Former    Current packs/day: 0.00    Average packs/day: 0.5 packs/day for 7.0 years (3.5 ttl pk-yrs)    Types: Cigarettes    Start date: 89    Quit date: 1988    Years since quitting: 37.5   Smokeless tobacco: Never  Vaping Use   Vaping status: Never Used  Substance and Sexual  Activity   Alcohol use: No    Alcohol/week: 0.0 standard drinks of alcohol   Drug use: No   Sexual activity: Not on file    Comment: married  Other Topics Concern   Not on file  Social History Narrative   Married   Pt doesn't get reg exercise   New pet puppy   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   Social Drivers of Corporate investment banker Strain: Not on file  Food Insecurity: Not on file  Transportation Needs: Not on file  Physical Activity: Insufficiently Active (06/29/2023)   Exercise Vital Sign    Days of Exercise per Week: 7 days    Minutes of Exercise per Session: 10 min  Stress: Not on file  Social Connections: Not on file  Intimate Partner Violence: Not on file     BP 132/79   Pulse (!) 55   Ht 4' 11 (1.499 m)   SpO2 97%   BMI 38.70 kg/m   Physical Exam:  Obese but well appearing NAD HEENT: Unremarkable Neck:  No JVD, no thyromegally Lymphatics:  No adenopathy Back:  No CVA tenderness Lungs:  Clear with no wheezes HEART:  Regular rate rhythm, no murmurs, no rubs, no clicks Abd:  soft, positive bowel sounds, no organomegally, no rebound, no guarding Ext:  2 plus pulses, no edema, no cyanosis, no clubbing Skin:  No rashes no nodules Neuro:  CN II through XII intact, motor grossly intact  Assess/Plan:  1. Atrial flutter - she has had none since her ablation. 2. Atrial fib - minimal symptoms. However, her risks is such that I think that she should remain on Xarelto  unless she were to have bleeding. 3. HTN -her bp is well controlled today. Continue her current meds. 4. Peripheral edema - she is controlled. We discussed avoidance of salty foods. 5. Obesity - If she loses weight, she might be a candidate for afib ablation. Currently not severe enough to recommend though.   Danelle Dryden Tapley,MD

## 2024-07-04 DIAGNOSIS — R6889 Other general symptoms and signs: Secondary | ICD-10-CM | POA: Diagnosis not present

## 2024-07-04 DIAGNOSIS — Z01419 Encounter for gynecological examination (general) (routine) without abnormal findings: Secondary | ICD-10-CM | POA: Diagnosis not present

## 2024-07-04 DIAGNOSIS — R3121 Asymptomatic microscopic hematuria: Secondary | ICD-10-CM | POA: Diagnosis not present

## 2024-07-20 ENCOUNTER — Other Ambulatory Visit: Payer: Self-pay | Admitting: Internal Medicine

## 2024-07-20 DIAGNOSIS — J4521 Mild intermittent asthma with (acute) exacerbation: Secondary | ICD-10-CM

## 2024-08-29 ENCOUNTER — Other Ambulatory Visit: Payer: Self-pay | Admitting: Family

## 2024-09-11 ENCOUNTER — Encounter: Payer: Self-pay | Admitting: Internal Medicine

## 2024-09-11 ENCOUNTER — Other Ambulatory Visit: Payer: Self-pay | Admitting: Family

## 2024-09-11 ENCOUNTER — Ambulatory Visit: Attending: Internal Medicine | Admitting: Internal Medicine

## 2024-09-11 VITALS — BP 113/71 | HR 83 | Temp 97.8°F | Resp 16 | Ht 60.0 in | Wt 195.4 lb

## 2024-09-11 DIAGNOSIS — R768 Other specified abnormal immunological findings in serum: Secondary | ICD-10-CM

## 2024-09-11 DIAGNOSIS — M159 Polyosteoarthritis, unspecified: Secondary | ICD-10-CM | POA: Diagnosis not present

## 2024-09-11 DIAGNOSIS — R600 Localized edema: Secondary | ICD-10-CM | POA: Diagnosis not present

## 2024-09-11 DIAGNOSIS — M722 Plantar fascial fibromatosis: Secondary | ICD-10-CM | POA: Diagnosis not present

## 2024-09-11 DIAGNOSIS — Z780 Asymptomatic menopausal state: Secondary | ICD-10-CM | POA: Insufficient documentation

## 2024-09-11 DIAGNOSIS — R7689 Other specified abnormal immunological findings in serum: Secondary | ICD-10-CM | POA: Insufficient documentation

## 2024-09-11 NOTE — Patient Instructions (Signed)
 I recommend symptom treatments for eye dryness including lubricating eye drops and can use gel or ointment based products for overnight.  Also consider use of humidifier at night during dry weather.  Use follow-up regularly with your eye doctor.  If symptoms worsen there are several types of medicated eyedrop that can help with the dryness or inflammation.  For chronic dry mouth is important to stay well-hydrated.  You can also use sugar-free gum or lozenges to stimulate the saliva production.  Biotene mouthwash or lozenges may also be helpful.  Products into [employer] also work to stimulate saliva production.  Continue following up with your dentist regularly because dryness problems can increase the risk of tooth and gum decay.  If symptoms get worse there are medications for stimulating tear and saliva production but will try all the other options first.

## 2024-09-11 NOTE — Progress Notes (Signed)
 Office Visit Note  Patient: Gail [name]             Date of Birth: [birth year]           MRN: [id number]             PCP: Charlett Apolinar POUR, MD Referring: Charlett Apolinar POUR, MD Visit Date: 09/11/2024 Occupation: INFANT TEACHER  Subjective:  New Patient (Initial Visit) (Trigger finger has had injections, tingling in the feet, knee pain as well as restless leg currently taking gabapentin  ) and Abnormal Lab (Has copy of labs in the car)   Discussed the use of AI scribe software for clinical note transcription with the patient, who gave verbal consent to proceed.  History of Present Illness   Gail [name] is a 58 year old female with osteoarthritis who presents with hand arthritis, recurrent triggering, and contracture problems. She was referred by her primary care doctor for evaluation of abnormal lab tests positive ANA and elevated sed rate.  She experiences hand arthritis with recurrent triggering and contracture problems. Multiple corticosteroid injections in her fingers provide temporary relief for about three months. She has not undergone surgical intervention yet.  There is a history of abnormal lab tests, including a low positive ANA test and a slight elevation in sedimentation rate. Additionally, she has microscopic hematuria with trace amounts of red blood cells in urine testing.  She experiences knee pain and acknowledges the need for a knee replacement. She manages knee pain with gabapentin , taken at 300 mg at night. Tingling in her legs and feet occurs, particularly when standing for long periods.  She has a history of dry mouth and dry skin, managed with Betaine mouth spray and lotion. She previously used Restasis for dry eyes but switched to Systane Complete with good results.  She has a history of rosacea and perioral dermatitis, for which she uses topical creams. There is also a history of skin cancer on her face previously excision biopsy.  She has a history of plantar  fasciitis and has received injections for this condition with limited benefit. Swelling in her legs occurs, particularly at night.  She has a history of PCOS, diagnosed when she attempted to conceive. She uses a Mirena  IUD to manage menstrual irregularities and has not had a period for the past year.  She experiences hot flashes and joint pain, which she associates with menopause. She has not been on hormone replacement therapy but notes that the Mirena  IUD may help mitigate some symptoms. Labs with GYN office confirmatory with FSH 31.  There is a family history of osteoarthritis, believed to be inherited from her father's side. She deals with osteoarthritis in her hands and knees, impacting her daily activities.        Activities of Daily Living:  Patient reports morning stiffness for 30-60 minutes.   Patient Reports nocturnal pain.  Difficulty dressing/grooming: Denies Difficulty climbing stairs: Reports Difficulty getting out of chair: Reports Difficulty using hands for taps, buttons, cutlery, and/or writing: Reports  Review of Systems  Constitutional:  Positive for fatigue.  HENT:  Positive for mouth dryness. Negative for mouth sores.   Eyes:  Positive for dryness.  Respiratory:  Positive for shortness of breath.   Cardiovascular:  Negative for chest pain and palpitations.  Gastrointestinal:  Positive for constipation. Negative for blood in stool and diarrhea.  Endocrine: Negative for increased urination.  Genitourinary:  Negative for involuntary urination.  Musculoskeletal:  Positive for joint pain, gait problem, joint  pain, joint swelling, myalgias, muscle weakness, morning stiffness, muscle tenderness and myalgias.  Skin:  Negative for color change, rash, hair loss and sensitivity to sunlight.  Allergic/Immunologic: Negative for susceptible to infections.  Neurological:  Positive for dizziness and headaches.  Hematological:  Negative for swollen glands.   Psychiatric/Behavioral:  Positive for depressed mood and sleep disturbance. The patient is nervous/anxious.     PMFS History:  Patient Active Problem List   Diagnosis Date Noted   Generalized osteoarthritis 09/11/2024   Positive ANA (antinuclear antibody) 09/11/2024   Peripheral edema 09/11/2024   Plantar fasciitis 09/11/2024   Menopause 09/11/2024   Paroxysmal atrial fibrillation (HCC) 05/28/2020   Atrial flutter (HCC) 06/26/2019   OSA (obstructive sleep apnea) 05/04/2017   Hypersomnia 01/03/2017   Low normal vitamin B12 level 07/07/2015   Axillary lump 09/18/2014   Intermittent sleepiness 09/18/2014   Flushes 04/17/2014   Anxiety in acute stress reaction 03/13/2014   Teeth grinding 07/06/2013   Head pain left   07/06/2013   Metabolic syndrome 10/08/2012   Carbuncle 09/29/2012   Rosacea 09/29/2012   Chronic cough 05/03/2012   Hearing difficulty 02/25/2012   Eustachian tube dysfunction 02/25/2012   Heart palpitations 07/07/2011   Neck pain 04/01/2011   Back pain 04/01/2011   Obesity 01/05/2011   ASTHMA 12/29/2009   HYPERGLYCEMIA, BORDERLINE 11/25/2009   PALPITATIONS, RECURRENT 09/23/2009   TOBACCO USE, QUIT 09/23/2009   ARTHRITIS 04/30/2008   POLYCYSTIC OVARIES 10/16/2007   FEMALE INFERTILITY 10/16/2007   HYPERLIPIDEMIA 07/18/2007   Essential hypertension 07/18/2007    Past Medical History:  Diagnosis Date   Anticoagulant long-term use    xarelto --- managed by dr g. taylor   Anxiety    Chronic pain of both shoulders    Dysrhythmia    Family history of adverse reaction to anesthesia    mother--- ponv   Heart murmur    History of anal fissures    History of infertility, female    History of kidney stones    Hyperlipidemia    Hypertension    IBS (irritable bowel syndrome)    Left ureteral stone    OA (osteoarthritis)    PAF (paroxysmal atrial fibrillation) (HCC)    PCO (polycystic ovaries)    PONV (postoperative nausea and vomiting)    RLS (restless legs  syndrome)    Rosacea    Type 2 diabetes mellitus (HCC)     Family History  Problem Relation Age of Onset   Hypertension Mother    Heart disease Mother    Hypertension Father    ALS Father        deceased   Dementia Maternal Grandfather    Heart disease Paternal Grandmother    Dementia Paternal Grandfather    Hypertension Other    Lung cancer Other        uncle   Breast cancer Other        great aunt   Colon cancer Neg Hx    Stomach cancer Neg Hx    Liver cancer Neg Hx    Rectal cancer Neg Hx    Esophageal cancer Neg Hx    Past Surgical History:  Procedure Laterality Date   A-FLUTTER ABLATION N/A 07/16/2019   Procedure: A-FLUTTER ABLATION;  Surgeon: Waddell Danelle ORN, MD;  Location: [facility];  Service: Cardiovascular;  Laterality: N/A;   ATRIAL TACH ABLATION N/A 07/16/2019   Procedure: ATRIAL TACH ABLATION;  Surgeon: Waddell Danelle ORN, MD;  Location: [facility];  Service:  Cardiovascular;  Laterality: N/A;   CARPAL TUNNEL RELEASE Bilateral 2006   HAND TENDON SURGERY Bilateral    bilateral thumb's   INTRAUTERINE DEVICE (IUD) INSERTION     KNEE ARTHROSCOPY Left 07/08/2010   @[facility]  by dr yvone   SHOULDER ARTHROSCOPY WITH ROTATOR CUFF REPAIR AND OPEN BICEPS TENODESIS Left 12/21/2021   @[facility]    WISDOM TOOTH EXTRACTION     Social History   Tobacco Use   Smoking status: Former    Current packs/day: 0.00    Average packs/day: 0.5 packs/day for 7.0 years (3.5 ttl pk-yrs)    Types: Cigarettes    Start date: 89    Quit date: 53    Years since quitting: 37.7    Passive exposure: Never   Smokeless tobacco: Never  Vaping Use   Vaping status: Never Used  Substance Use Topics   Alcohol use: No    Alcohol/week: 0.0 standard drinks of alcohol   Drug use: No   Social History   Social History Narrative   Married   Pt doesn't get reg exercise   New pet puppy   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     Immunization History  Administered Date(s) Administered   Influenza Split 09/13/2011, 09/12/2012, 09/12/2013, 09/23/2017   Influenza,inj,Quad PF,6+ Mos 09/20/2016, 10/04/2018, 09/13/2019, 09/26/2020   Influenza-Unspecified 09/12/2014, 10/03/2018, 09/12/2019   PFIZER(Purple Top)SARS-COV-2 Vaccination 04/17/2020, 05/08/2020   PPD Test 08/08/2018   Td 12/13/1996, 11/25/2009     Objective: Vital Signs: BP 113/71 (BP Location: Right Arm, Patient Position: Sitting, Cuff Size: Normal)   Pulse 83   Temp 97.8 F (36.6 C)   Resp 16   Ht 5' (1.524 m)   Wt 195 lb 6.4 oz (88.6 kg)   BMI 38.16 kg/m    Physical Exam Constitutional:      Appearance: She is obese.  HENT:     Mouth/Throat:     Mouth: Mucous membranes are moist.     Pharynx: Oropharynx is clear.  Eyes:     Conjunctiva/sclera: Conjunctivae normal.  Cardiovascular:     Rate and Rhythm: Normal rate and regular rhythm.  Pulmonary:     Effort: Pulmonary effort is normal.     Breath sounds: Normal breath sounds.  Lymphadenopathy:     Cervical: No cervical adenopathy.  Skin:    General: Skin is warm and dry.     Comments: Centtral facial erythema Normal nailfold capillaries Trace pitting edema, mild petechiae on lower legs  Neurological:     Mental Status: She is alert.  Psychiatric:        Mood and Affect: Mood normal.      Musculoskeletal Exam:  Neck full ROM no tenderness Shoulders full ROM no tenderness or swelling Elbows full ROM no tenderness or swelling Wrists full ROM no tenderness or swelling Fingers full ROM, PIP and DIP heberdon's nodes, tenderness proximal to MCP joints at A1 pulley multiple fingers worst right 4th digit Knees full ROM, left knee painful with flexion past 90 degrees, no effusion Tight calf muscles with tenderness, ankle dorsiflexion limited Tenderness ot pressure on plantar surface anterior to calcaneus   Investigation: No additional  findings.  Imaging: No results found.  Recent Labs: Lab Results  Component Value Date   WBC 7.9 04/11/2024   HGB 15.1 (H) 04/11/2024   PLT 257.0 04/11/2024   NA  139 04/11/2024   K 4.2 04/11/2024   CL 100 04/11/2024   CO2 29 04/11/2024   GLUCOSE 103 (H) 04/11/2024   BUN 20 04/11/2024   CREATININE 0.87 04/11/2024   BILITOT 0.4 04/11/2024   ALKPHOS 99 04/11/2024   AST 21 04/11/2024   ALT 26 04/11/2024   PROT 7.6 04/11/2024   ALBUMIN 4.5 04/11/2024   CALCIUM 9.8 04/11/2024   GFRAA 95 07/13/2019    Speciality Comments: No specialty comments available.  Procedures:  No procedures performed Allergies: Ace inhibitors, Erythromycin  ethylsuccinate, Doxycycline , and Latex   Assessment / Plan:     Visit Diagnoses: Positive ANA (antinuclear antibody) - Plan: Anti-DNA antibody, double-stranded, C3 and C4, Anti-Smith antibody, Sedimentation rate Microscopic hematuria with positive ANA and elevated sedimentation rate suggesting possible autoimmune process. Joint pain multiple areas with no synovitis appreciable on exam. Rosacea rash is typical in appearance, does not look like malar rash. Skin flushing but no typical raynauds. - Order additional antibody tests to rule out lupus or other autoimmune conditions. - Recheck sedimentation rate.  Generalized osteoarthritis with hand involvement and trigger finger Chronic osteoarthritis with hand involvement and trigger finger. Symptoms include pain, stiffness, and new bone formation. Likely genetic predisposition as symptoms appear very widespread. Trigger finger treated with corticosteroid injections, providing temporary relief. - Continue corticosteroid injections for trigger finger as needed. - Educated on the nature of osteoarthritis and genetic predisposition.  Plantar fasciitis Chronic plantar fasciitis with tight calf muscles and limited ankle mobility. - Provide handout with exercises for calf and plantar fascia stretching. -  Recommend stretching exercises with knee straight and bent.  Peripheral edema with venous stasis changes Peripheral edema with venous stasis changes, including pitting edema, varicose veins, and petechiae. - Recommend wearing compression socks during the day. - Advise elevating feet when sitting for extended periods. - Consider diuretics as needed for significant swelling.  Sicca symptoms (dry mouth and dry eyes) Chronic sicca symptoms managed with over-the-counter products. No particular complications of severe conjunctivitis or oral lesions on exam.  Menopausal syndrome with hot flashes Menopausal syndrome with hot flashes and joint pain, managed with Mirena  IUD. Discussed potential benefits of SNRIs for vasomotor symptoms and osteoarthritis exacerbation during menopause. - Consider SNRI if symptoms persistent  Perioral dermatitis and rosacea Intermittent perioral dermatitis and rosacea, treated with topical creams. - Continue using topical creams as needed for flare-ups.  Atrial fibrillation and atrial flutter, status post ablation Atrial fibrillation and atrial flutter, status post ablation, managed with blood thinners.  Unfortunately limiting use of NSAIDs for her OA symptoms.  Polycystic ovary syndrome (PCOS) PCOS with irregular menstrual cycles and hirsutism, managed with Mirena  IUD. - Continue current management with Mirena  IUD.   Orders: Orders Placed This Encounter  Procedures   Anti-DNA antibody, double-stranded   C3 and C4   Anti-Smith antibody   Sedimentation rate   No orders of the defined types were placed in this encounter.   Follow-Up Instructions: Return in about 4 weeks (around 10/09/2024), or if symptoms worsen or fail to improve, for New pt +ANA/OA f/u 61mo.   Lonni LELON Ester, MD  Note - This record has been created using [employer].  Chart creation errors have been sought, but may not always  have been located. Such creation errors do not  reflect on  the standard of medical care.

## 2024-09-12 LAB — ANTI-DNA ANTIBODY, DOUBLE-STRANDED: ds DNA Ab: 1 [IU]/mL

## 2024-09-12 LAB — C3 AND C4
C3 Complement: 223 mg/dL — ABNORMAL HIGH (ref 83–193)
C4 Complement: 39 mg/dL (ref 15–57)

## 2024-09-12 LAB — ANTI-SMITH ANTIBODY: ENA SM Ab Ser-aCnc: <1 AI

## 2024-09-12 LAB — SEDIMENTATION RATE: Sed Rate: 25 mm/h (ref 0–30)

## 2024-09-13 ENCOUNTER — Other Ambulatory Visit: Payer: Self-pay

## 2024-09-13 MED ORDER — METFORMIN HCL ER 500 MG PO TB24: ORAL_TABLET | ORAL | 0 refills | Status: DC

## 2024-09-17 ENCOUNTER — Telehealth: Payer: Self-pay

## 2024-09-17 NOTE — Telephone Encounter (Signed)
 Patient called to follow up on labs from 09/11/2024 and possible next steps. Patient states that you mentioned medication at the last visit so she did not know if that was an option? Or does she need to schedule a follow up? Please advise. Thanks!

## 2024-09-18 DIAGNOSIS — Z713 Dietary counseling and surveillance: Secondary | ICD-10-CM | POA: Diagnosis not present

## 2024-09-19 DIAGNOSIS — D1801 Hemangioma of skin and subcutaneous tissue: Secondary | ICD-10-CM | POA: Diagnosis not present

## 2024-09-19 DIAGNOSIS — L814 Other melanin hyperpigmentation: Secondary | ICD-10-CM | POA: Diagnosis not present

## 2024-09-19 DIAGNOSIS — L309 Dermatitis, unspecified: Secondary | ICD-10-CM | POA: Diagnosis not present

## 2024-09-19 DIAGNOSIS — L821 Other seborrheic keratosis: Secondary | ICD-10-CM | POA: Diagnosis not present

## 2024-10-08 ENCOUNTER — Other Ambulatory Visit: Payer: Self-pay | Admitting: Internal Medicine

## 2024-10-08 ENCOUNTER — Ambulatory Visit (HOSPITAL_BASED_OUTPATIENT_CLINIC_OR_DEPARTMENT_OTHER)
Admission: RE | Admit: 2024-10-08 | Discharge: 2024-10-08 | Disposition: A | Discharge status: Home / Self Care | Payer: Self-pay | Source: Ambulatory Visit | Attending: Internal Medicine | Admitting: Internal Medicine

## 2024-10-08 DIAGNOSIS — I480 Paroxysmal atrial fibrillation: Secondary | ICD-10-CM | POA: Insufficient documentation

## 2024-10-08 DIAGNOSIS — I10 Essential (primary) hypertension: Secondary | ICD-10-CM | POA: Insufficient documentation

## 2024-10-10 ENCOUNTER — Ambulatory Visit: Attending: Internal Medicine | Admitting: Internal Medicine

## 2024-10-10 ENCOUNTER — Encounter: Payer: Self-pay | Admitting: Internal Medicine

## 2024-10-10 VITALS — BP 129/66 | HR 88 | Temp 97.8°F | Resp 16 | Ht 60.0 in | Wt 197.6 lb

## 2024-10-10 DIAGNOSIS — M159 Polyosteoarthritis, unspecified: Secondary | ICD-10-CM

## 2024-10-10 MED ORDER — DULOXETINE HCL 20 MG PO CPEP: 20.0000 mg | ORAL_CAPSULE | Freq: Every day | ORAL | 2 refills | Status: AC

## 2024-10-10 NOTE — Progress Notes (Signed)
 Office Visit Note  Patient: Gail [name]             Date of Birth: 09/21/1966           MRN: [id number]             PCP: Charlett Apolinar POUR, MD Referring: Charlett Apolinar POUR, MD Visit Date: 10/10/2024   Subjective:  Pain (Ring finger locking on both hands. ) and Medical Management of Chronic Issues (Discuss labs and the Cymbalta that was mentioned at last visit. )   History of Present Illness: Gail [name] is a 58 y.o. female here for follow up for ongoing chronic joint pain [place] including both hands and her positive ANA labs.  Workup at her initial visit was negative for double-stranded DNA Smith antibodies had normal sedimentation rate.  Complement C3 was slightly high at 223 which would not be indicative for systemic lupus.   We had discussed possibly starting a neuropathy agent for chronic widespread osteoarthritis related pain such as Cymbalta which she would be interested in at this time.  She is limited in other as needed options for joint inflammation and pain due to Xarelto  for A-fib.  She has had multiple trigger finger injections with Dr. Sebastian.  These have not been very definitively effective which she is very reticent to pursue any additional surgical corrections.   Labs reviewed 09/2022 Urinalysis Microscopic hematuria Mixed urogenital flora Increased urine calcium and calcium oxalate   Previous HPI 09/11/24 Gail [name] is a 58 year old female with osteoarthritis who presents with hand arthritis, recurrent triggering, and contracture problems. She was referred by her primary care doctor for evaluation of abnormal lab tests positive ANA and elevated sed rate.   She experiences hand arthritis with recurrent triggering and contracture problems. Multiple corticosteroid injections in her fingers provide temporary relief for about three months. She has not undergone surgical intervention yet.   There is a history of abnormal lab tests, including a low positive  ANA test and a slight elevation in sedimentation rate. Additionally, she has microscopic hematuria with trace amounts of red blood cells in urine testing.   She experiences knee pain and acknowledges the need for a knee replacement. She manages knee pain with gabapentin , taken at 300 mg at night. Tingling in her legs and feet occurs, particularly when standing for long periods.   She has a history of dry mouth and dry skin, managed with Betaine mouth spray and lotion. She previously used Restasis for dry eyes but switched to Systane Complete with good results.   She has a history of rosacea and perioral dermatitis, for which she uses topical creams. There is also a history of skin cancer on her face previously excision biopsy.   She has a history of plantar fasciitis and has received injections for this condition with limited benefit. Swelling in her legs occurs, particularly at night.   She has a history of PCOS, diagnosed when she attempted to conceive. She uses a Mirena  IUD to manage menstrual irregularities and has not had a period for the past year.   She experiences hot flashes and joint pain, which she associates with menopause. She has not been on hormone replacement therapy but notes that the Mirena  IUD may help mitigate some symptoms. Labs with GYN office confirmatory with FSH 31.   There is a family history of osteoarthritis, believed to be inherited from her father's side. She deals with osteoarthritis in her hands and knees, impacting  her daily activities.   Labs reviewed 03/2024 ANA 1:80 nucleolar SSA, SSB neg CCP neg ESR 33 CRP wnl  Review of Systems  Constitutional:  Positive for fatigue.  HENT:  Negative for mouth sores and mouth dryness.   Eyes:  Positive for dryness.  Respiratory:  Positive for shortness of breath.   Cardiovascular:  Positive for palpitations. Negative for chest pain.  Gastrointestinal:  Positive for constipation. Negative for blood in stool and  diarrhea.  Endocrine: Negative for increased urination.  Genitourinary:  Negative for involuntary urination.  Musculoskeletal:  Positive for joint pain, gait problem, joint pain, joint swelling, myalgias, morning stiffness, muscle tenderness and myalgias. Negative for muscle weakness.  Skin:  Negative for color change, rash, hair loss and sensitivity to sunlight.  Allergic/Immunologic: Positive for susceptible to infections.  Neurological:  Positive for headaches. Negative for dizziness.  Hematological:  Negative for swollen glands.  Psychiatric/Behavioral:  Positive for depressed mood and sleep disturbance. The patient is nervous/anxious.     PMFS History:  Patient Active Problem List   Diagnosis Date Noted   Generalized osteoarthritis 09/11/2024   Positive ANA (antinuclear antibody) 09/11/2024   Peripheral edema 09/11/2024   Plantar fasciitis 09/11/2024   Menopause 09/11/2024   Paroxysmal atrial fibrillation (HCC) 05/28/2020   Atrial flutter (HCC) 06/26/2019   OSA (obstructive sleep apnea) 05/04/2017   Hypersomnia 01/03/2017   Low normal vitamin B12 level 07/07/2015   Axillary lump 09/18/2014   Intermittent sleepiness 09/18/2014   Flushes 04/17/2014   Anxiety in acute stress reaction 03/13/2014   Teeth grinding 07/06/2013   Head pain left   07/06/2013   Metabolic syndrome 10/08/2012   Carbuncle 09/29/2012   Rosacea 09/29/2012   Chronic cough 05/03/2012   Hearing difficulty 02/25/2012   Eustachian tube dysfunction 02/25/2012   Heart palpitations 07/07/2011   Neck pain 04/01/2011   Back pain 04/01/2011   Obesity 01/05/2011   ASTHMA 12/29/2009   HYPERGLYCEMIA, BORDERLINE 11/25/2009   PALPITATIONS, RECURRENT 09/23/2009   TOBACCO USE, QUIT 09/23/2009   ARTHRITIS 04/30/2008   POLYCYSTIC OVARIES 10/16/2007   FEMALE INFERTILITY 10/16/2007   HYPERLIPIDEMIA 07/18/2007   Essential hypertension 07/18/2007    Past Medical History:  Diagnosis Date   Anticoagulant long-term use     xarelto --- managed by dr g. taylor   Anxiety    Chronic pain of both shoulders    Dysrhythmia    Family history of adverse reaction to anesthesia    mother--- ponv   Heart murmur    History of anal fissures    History of infertility, female    History of kidney stones    Hyperlipidemia    Hypertension    IBS (irritable bowel syndrome)    Left ureteral stone    OA (osteoarthritis)    PAF (paroxysmal atrial fibrillation) (HCC)    PCO (polycystic ovaries)    PONV (postoperative nausea and vomiting)    RLS (restless legs syndrome)    Rosacea    Type 2 diabetes mellitus (HCC)     Family History  Problem Relation Age of Onset   Hypertension Mother    Heart disease Mother    Hypertension Father    ALS Father        deceased   Dementia Maternal Grandfather    Heart disease Paternal Grandmother    Dementia Paternal Grandfather    Hypertension Other    Lung cancer Other        uncle   Breast cancer Other  great aunt   Colon cancer Neg Hx    Stomach cancer Neg Hx    Liver cancer Neg Hx    Rectal cancer Neg Hx    Esophageal cancer Neg Hx    Past Surgical History:  Procedure Laterality Date   A-FLUTTER ABLATION N/A 07/16/2019   Procedure: A-FLUTTER ABLATION;  Surgeon: Waddell Danelle ORN, MD;  Location: [facility];  Service: Cardiovascular;  Laterality: N/A;   ATRIAL TACH ABLATION N/A 07/16/2019   Procedure: ATRIAL TACH ABLATION;  Surgeon: Waddell Danelle ORN, MD;  Location: [facility];  Service: Cardiovascular;  Laterality: N/A;   CARPAL TUNNEL RELEASE Bilateral 2006   HAND TENDON SURGERY Bilateral    bilateral thumb's   INTRAUTERINE DEVICE (IUD) INSERTION     KNEE ARTHROSCOPY Left 07/08/2010   @[facility]  by dr graves   SHOULDER ARTHROSCOPY WITH ROTATOR CUFF REPAIR AND OPEN BICEPS TENODESIS Left 12/21/2021   @[facility]    WISDOM TOOTH EXTRACTION     Social History   Social History Narrative   Married   Pt doesn't get reg exercise   New pet puppy   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   Immunization History  Administered Date(s) Administered   Influenza Split 09/13/2011, 09/12/2012, 09/12/2013, 09/23/2017   Influenza,inj,Quad PF,6+ Mos 09/20/2016, 10/04/2018, 09/13/2019, 09/26/2020   Influenza-Unspecified 09/12/2014, 10/03/2018, 09/12/2019   PFIZER(Purple Top)SARS-COV-2 Vaccination 04/17/2020, 05/08/2020   PPD Test 08/08/2018   Td 12/13/1996, 11/25/2009     Objective: Vital Signs: BP 129/66   Pulse 88   Temp 97.8 F (36.6 C)   Resp 16   Ht 5' (1.524 m)   Wt 197 lb 9.6 oz (89.6 kg)   BMI 38.59 kg/m    Physical Exam Constitutional:      Appearance: She is obese.  Eyes:     Conjunctiva/sclera: Conjunctivae normal.  Cardiovascular:     Rate and Rhythm: Normal rate and regular rhythm.  Pulmonary:     Effort: Pulmonary effort is normal.     Breath sounds: Normal breath sounds.  Lymphadenopathy:     Cervical: No cervical adenopathy.  Skin:    General: Skin is warm and dry.     Findings: No rash.  Neurological:     Mental Status: She is alert.  Psychiatric:        Mood and Affect: Mood normal.      Musculoskeletal Exam:  Neck full ROM no tenderness Shoulders full ROM no tenderness or swelling Elbows full ROM no tenderness or swelling Wrists full ROM no tenderness or swelling Fingers full ROM, PIP and DIP heberdon's nodes, tenderness proximal to MCP joints at A1 pulley multiple fingers worst right 4th digit Knees full ROM, left knee painful with flexion past 90 degrees, no effusion  Investigation: No additional findings.  Imaging: CT CARDIAC SCORING (SELF PAY ONLY) Addendum Date: 10/11/2024 ADDENDUM REPORT: 10/11/2024 22:17 EXAM: OVER-READ INTERPRETATION  CT CHEST The following report is an over-read performed by radiologist Dr. Arron Gasman of [facility] on 10/11/2024. This over-read does not include interpretation of  cardiac or coronary anatomy or pathology. The coronary calcium score interpretation by the cardiologist is attached. COMPARISON:  None. FINDINGS: Vascular: Minor aortic atherosclerosis. The included aorta is normal in caliber. Mediastinum/nodes: No adenopathy or mass. Unremarkable esophagus. Lungs: No focal airspace disease. No pulmonary nodule. No pleural fluid. The  included airways are patent. Upper abdomen: No acute or unexpected findings. Musculoskeletal: There are no acute or suspicious osseous abnormalities. Thoracic spondylosis. Posterior spurring in the lower thoracic spine causes narrowing of the spinal canal. IMPRESSION: 1.  Aortic Atherosclerosis (ICD10-I70.0). 2. Thoracic spondylosis. Posterior spurring in the lower thoracic spine causes narrowing of the spinal canal. Electronically Signed   By: Isobella Gasman M.D.   On: 10/11/2024 22:17   Result Date: 10/11/2024 CLINICAL DATA:  Cardiovascular Disease Risk stratification EXAM: Coronary Calcium Score TECHNIQUE: A gated, non-contrast computed tomography scan of the heart was performed using 3mm slice thickness. Axial images were analyzed on a dedicated workstation. Calcium scoring of the coronary arteries was performed using the Agatston method. FINDINGS: Coronary arteries: Normal origins. Coronary Calcium Score: Left main: 0 Left anterior descending artery: 0 Left circumflex artery: 0 Right coronary artery: 1.83 Total: 1.83 Percentile: 74th Pericardium: Normal. Ascending Aorta: Normal caliber. Non-cardiac: See separate report from [facility]. IMPRESSION: Coronary calcium score of 1.83. This was 74th percentile for age-, race-, and sex-matched controls. RECOMMENDATIONS: Coronary artery calcium (CAC) score is a strong predictor of incident coronary heart disease (CHD) and provides predictive information beyond traditional risk factors. CAC scoring is reasonable to use in the decision to withhold, postpone, or initiate statin therapy in  intermediate-risk or selected borderline-risk asymptomatic adults (age 22-75 years and LDL-C >=70 to <190 mg/dL) who do not have diabetes or established atherosclerotic cardiovascular disease (ASCVD).* In intermediate-risk (10-year ASCVD risk >=7.5% to <20%) adults or selected borderline-risk (10-year ASCVD risk >=5% to <7.5%) adults in whom a CAC score is measured for the purpose of making a treatment decision the following recommendations have been made: If CAC=0, it is reasonable to withhold statin therapy and reassess in 5 to 10 years, as long as higher risk conditions are absent (diabetes mellitus, family history of premature CHD in first degree relatives (males <55 years; females <65 years), cigarette smoking, or LDL >=190 mg/dL). If CAC is 1 to 99, it is reasonable to initiate statin therapy for patients >=5 years of age. If CAC is >=100 or >=75th percentile, it is reasonable to initiate statin therapy at any age. Cardiology referral should be considered for patients with CAC scores >=400 or >=75th percentile. *2018 AHA/ACC/AACVPR/AAPA/ABC/ACPM/ADA/AGS/APhA/ASPC/NLA/PCNA Guideline on the Management of Blood Cholesterol: A Report of the American College of Cardiology/American Heart Association Task Force on Clinical Practice Guidelines. J Am Coll Cardiol. 2019;73(24):3168-3209. Wilbert Bihari, MD Electronically Signed: By: Wilbert Bihari M.D. On: 10/08/2024 17:58    Recent Labs: Lab Results  Component Value Date   WBC 7.9 04/11/2024   HGB 15.1 (H) 04/11/2024   PLT 257.0 04/11/2024   NA 139 04/11/2024   K 4.2 04/11/2024   CL 100 04/11/2024   CO2 29 04/11/2024   GLUCOSE 103 (H) 04/11/2024   BUN 20 04/11/2024   CREATININE 0.87 04/11/2024   BILITOT 0.4 04/11/2024   ALKPHOS 99 04/11/2024   AST 21 04/11/2024   ALT 26 04/11/2024   PROT 7.6 04/11/2024   ALBUMIN 4.5 04/11/2024   CALCIUM 9.8 04/11/2024   GFRAA 95 07/13/2019    Speciality Comments: No specialty comments available.  Procedures:   No procedures performed Allergies: Ace inhibitors, Erythromycin  ethylsuccinate, Doxycycline , and Latex   Assessment / Plan:     Visit Diagnoses: Generalized osteoarthritis - Plan: DULoxetine (CYMBALTA) 20 MG capsule Chronic osteoarthritis with hand involvement and trigger finger. Symptoms include pain, stiffness, and new bone formation. Likely genetic predisposition as symptoms appear very widespread. Trigger  finger treated with corticosteroid injections, providing temporary relief. - Continue corticosteroid injections for trigger finger as needed. - Start Cymbalta 20 mg once daily  Orders: No orders of the defined types were placed in this encounter.  Meds ordered this encounter  Medications   DULoxetine (CYMBALTA) 20 MG capsule    Sig: Take 1 capsule (20 mg total) by mouth daily.    Dispense:  30 capsule    Refill:  2     Follow-Up Instructions: Return in about 3 months (around 01/10/2025) for OA/Trigger finger CYM start f/u 3mos.   Lonni LELON Ester, MD  Note - This record has been created using [employer].  Chart creation errors have been sought, but may not always  have been located. Such creation errors do not reflect on  the standard of medical care.

## 2024-10-10 NOTE — Patient Instructions (Signed)
 Consider asking Dr. Sebastian if you would be a candidate at all for xiaflex/collagenase injection. If not,  you could follow up with him or here regarding repeat trigger finger injection as needed.

## 2024-10-17 ENCOUNTER — Encounter: Admitting: Internal Medicine

## 2024-10-17 DIAGNOSIS — M1712 Unilateral primary osteoarthritis, left knee: Secondary | ICD-10-CM | POA: Diagnosis not present

## 2024-10-17 DIAGNOSIS — R531 Weakness: Secondary | ICD-10-CM | POA: Diagnosis not present

## 2024-10-17 DIAGNOSIS — M25662 Stiffness of left knee, not elsewhere classified: Secondary | ICD-10-CM | POA: Diagnosis not present

## 2024-10-18 ENCOUNTER — Other Ambulatory Visit (INDEPENDENT_AMBULATORY_CARE_PROVIDER_SITE_OTHER)

## 2024-10-18 ENCOUNTER — Encounter: Payer: Self-pay | Admitting: Internal Medicine

## 2024-10-18 DIAGNOSIS — R7303 Prediabetes: Secondary | ICD-10-CM

## 2024-10-18 DIAGNOSIS — E785 Hyperlipidemia, unspecified: Secondary | ICD-10-CM | POA: Diagnosis not present

## 2024-10-18 DIAGNOSIS — Z713 Dietary counseling and surveillance: Secondary | ICD-10-CM | POA: Diagnosis not present

## 2024-10-18 DIAGNOSIS — R202 Paresthesia of skin: Secondary | ICD-10-CM | POA: Diagnosis not present

## 2024-10-18 DIAGNOSIS — Z79899 Other long term (current) drug therapy: Secondary | ICD-10-CM | POA: Diagnosis not present

## 2024-10-18 LAB — LIPID PANEL
Cholesterol: 220 mg/dL — ABNORMAL HIGH (ref 0–200)
HDL: 51.8 mg/dL (ref >39.00)
LDL Cholesterol: 148 mg/dL — ABNORMAL HIGH (ref 0–99)
NonHDL: 167.82
Total CHOL/HDL Ratio: 4
Triglycerides: 97 mg/dL (ref 0.0–149.0)
VLDL: 19.4 mg/dL (ref 0.0–40.0)

## 2024-10-18 LAB — BASIC METABOLIC PANEL WITH GFR
BUN: 20 mg/dL (ref 6–23)
CO2: 26 meq/L (ref 19–32)
Calcium: 9.1 mg/dL (ref 8.4–10.5)
Chloride: 104 meq/L (ref 96–112)
Creatinine, Ser: 0.8 mg/dL (ref 0.40–1.20)
GFR: 82.53 mL/min (ref >60.00)
Glucose, Bld: 110 mg/dL — ABNORMAL HIGH (ref 70–99)
Potassium: 4.2 meq/L (ref 3.5–5.1)
Sodium: 139 meq/L (ref 135–145)

## 2024-10-18 LAB — HEMOGLOBIN A1C: Hgb A1c MFr Bld: 6.2 % (ref 4.6–6.5)

## 2024-10-21 ENCOUNTER — Ambulatory Visit: Payer: Self-pay | Admitting: Internal Medicine

## 2024-10-24 ENCOUNTER — Encounter: Payer: Self-pay | Admitting: Internal Medicine

## 2024-10-24 ENCOUNTER — Ambulatory Visit: Admitting: Internal Medicine

## 2024-10-24 VITALS — BP 112/68 | HR 83 | Temp 97.9°F | Ht 60.0 in | Wt 198.0 lb

## 2024-10-24 DIAGNOSIS — M25562 Pain in left knee: Secondary | ICD-10-CM

## 2024-10-24 DIAGNOSIS — Z79899 Other long term (current) drug therapy: Secondary | ICD-10-CM | POA: Diagnosis not present

## 2024-10-24 DIAGNOSIS — J4521 Mild intermittent asthma with (acute) exacerbation: Secondary | ICD-10-CM

## 2024-10-24 DIAGNOSIS — Z6379 Other stressful life events affecting family and household: Secondary | ICD-10-CM

## 2024-10-24 DIAGNOSIS — E785 Hyperlipidemia, unspecified: Secondary | ICD-10-CM | POA: Diagnosis not present

## 2024-10-24 DIAGNOSIS — R7303 Prediabetes: Secondary | ICD-10-CM | POA: Diagnosis not present

## 2024-10-24 DIAGNOSIS — G8929 Other chronic pain: Secondary | ICD-10-CM

## 2024-10-24 MED ORDER — BUDESONIDE-FORMOTEROL FUMARATE 160-4.5 MCG/ACT IN AERO: 2.0000 | INHALATION_SPRAY | Freq: Two times a day (BID) | RESPIRATORY_TRACT | 2 refills | Status: AC

## 2024-10-24 NOTE — Progress Notes (Signed)
 Chief Complaint  Patient presents with   Medical Management of Chronic Issues    HPI: Gail [name] 58 y.o. come in for Chronic disease management  mostly fu  hyperglycemia   Since last visit has seen Dr rice   given low dose  cymbalta for joint pain  felt oa  Knee replacement poss planned   to decide  January .   But pain in knees is crippling at this point  Shoulder  injury hx  Family illness and losses  including her dog.  Husband cannot work from  [employer] diabetic amputations  and trying to fet disability No new cv s   just had ct calcium score  ROS: See pertinent positives and negatives per HPI.  Past Medical History:  Diagnosis Date   Anticoagulant long-term use    xarelto --- managed by dr g. taylor   Anxiety    Chronic pain of both shoulders    Dysrhythmia    Family history of adverse reaction to anesthesia    mother--- ponv   Heart murmur    History of anal fissures    History of infertility, female    History of kidney stones    Hyperlipidemia    Hypertension    IBS (irritable bowel syndrome)    Left ureteral stone    OA (osteoarthritis)    PAF (paroxysmal atrial fibrillation) (HCC)    PCO (polycystic ovaries)    PONV (postoperative nausea and vomiting)    RLS (restless legs syndrome)    Rosacea    Type 2 diabetes mellitus (HCC)     Family History  Problem Relation Age of Onset   Hypertension Mother    Heart disease Mother    Hypertension Father    ALS Father        deceased   Dementia Maternal Grandfather    Heart disease Paternal Grandmother    Dementia Paternal Grandfather    Hypertension Other    Lung cancer Other        uncle   Breast cancer Other        great aunt   Colon cancer Neg Hx    Stomach cancer Neg Hx    Liver cancer Neg Hx    Rectal cancer Neg Hx    Esophageal cancer Neg Hx     Social History   Socioeconomic History   Marital status: Married    Spouse name: Not on file   Number of children: 0   Years of education:  Not on file   Highest education level: Not on file  Occupational History   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: [employer]  Tobacco Use   Smoking status: Former    Current packs/day: 0.00    Average packs/day: 0.5 packs/day for 7.0 years (3.5 ttl pk-yrs)    Types: Cigarettes    Start date: 86    Quit date: 1988    Years since quitting: 37.8    Passive exposure: Never   Smokeless tobacco: Never  Vaping Use   Vaping status: Never Used  Substance and Sexual Activity   Alcohol use: No    Alcohol/week: 0.0 standard drinks of alcohol   Drug use: No   Sexual activity: Not on file    Comment: married  Other Topics Concern   Not on file  Social History Narrative   Married   Pt doesn't get reg exercise   New pet puppy   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   Social Drivers of Corporate Investment Banker Strain: Not on file  Food Insecurity: Not on file  Transportation Needs: Not on file  Physical Activity: Insufficiently Active (06/29/2023)   Exercise Vital Sign    Days of Exercise per Week: 7 days    Minutes of Exercise per Session: 10 min  Stress: Not on file  Social Connections: Not on file    Outpatient Medications Prior to Visit  Medication Sig Dispense Refill   diltiazem  (CARDIZEM  CD) 360 MG 24 hr capsule Take 1 capsule by mouth daily. Please keep upcoming appointment in May 2024 with Dr. Waddell before any more refills. 90 capsule 1   fluticasone  (FLONASE ) 50 MCG/ACT nasal spray 2 sprays per nostril daily.  MUST HAVE OFFICE VISIT FOR FURTHER REFILLS. (Patient taking differently: as needed. 2 sprays per nostril daily.) 16 g 0   gabapentin  (NEURONTIN ) 300 MG capsule SMARTSIG:1 Capsule(s) By Mouth 1-3 Times Daily PRN     Lactobacillus (ACIDOPHILUS PO) Take 1 capsule by mouth at bedtime.     levonorgestrel  (MIRENA , 52 MG,) 20 MCG/24HR IUD 1 each by Intrauterine route  once.     losartan  (COZAAR ) 50 MG tablet TAKE 1 TABLET BY MOUTH EVERY DAY 90 tablet 2   metFORMIN  (GLUCOPHAGE -XR) 500 MG 24 hr tablet TAKE 1 TABLET(500 MG) BY MOUTH DAILY WITH BREAKFAST 90 tablet 0   metroNIDAZOLE  (METROCREAM ) 0.75 % cream Apply topically.     NON FORMULARY Pt uses a cpap nightly     nystatin  ointment (MYCOSTATIN ) Apply 1 Application topically daily. (Patient taking differently: Apply 1 Application topically as needed.) 30 g 0   vitamin B-12 (CYANOCOBALAMIN ) 1000 MCG tablet Take 1,000 mcg by mouth daily.     XARELTO  20 MG TABS tablet TAKE 1 TABLET BY MOUTH DAILY WITH SUPPER 30 tablet 5   Zinc Oxide 30.6 % CREA Apply 1 application topically every morning.     SYMBICORT  160-4.5 MCG/ACT inhaler INHALE 2 PUFFS INTO THE LUNGS TWICE DAILY (Patient taking differently: Inhale 2 puffs into the lungs as needed.) 10.2 g 0   albuterol  (VENTOLIN  HFA) 108 (90 Base) MCG/ACT inhaler INHALE 2 PUFFS INTO THE LUNGS EVERY 6 HOURS AS NEEDED FOR WHEEZING OR SHORTNESS OF BREATH 6.7 g 1   cyclobenzaprine  (FLEXERIL ) 10 MG tablet Take 10 mg by mouth every 8 (eight) hours as needed. (Patient not taking: Reported on 10/24/2024)     DULoxetine (CYMBALTA) 20 MG capsule Take 1 capsule (20 mg total) by mouth daily. (Patient not taking: Reported on 10/24/2024) 30 capsule 2   HYDROcodone  bit-homatropine (HYCODAN) 5-1.5 MG/5ML syrup Take 5 mLs by mouth every 6 (six) hours as needed for cough. (Patient not taking: Reported on 10/24/2024) 120 mL 0   hydrOXYzine  (ATARAX ) 25 MG tablet Take 1 tablet (25 mg total) by mouth at bedtime as needed. (Patient not taking: Reported on 10/24/2024) 30 tablet 1   Spacer/Aero-Holding Chambers (AEROCHAMBER PLUS) Device Use bid with inhalers. 1 each 0   Spacer/Aero-Holding Chambers DEVI Inhale 1 Inhalation into the lungs 2 (two) times daily. 1 Device 0   traMADol (ULTRAM) 50 MG tablet Take 50 mg by mouth every 12 (twelve) hours as needed. (Patient not taking: Reported on 10/24/2024)      triamcinolone cream (KENALOG) 0.1 % APPLY ON THE SKIN BID PRN (Patient not taking: Reported on 10/24/2024)  No facility-administered medications prior to visit.     EXAM:  BP 112/68 (BP Location: Left Arm, Patient Position: Sitting, Cuff Size: Large)   Pulse 83   Temp 97.9 F (36.6 C) (Oral)   Ht 5' (1.524 m)   Wt 198 lb (89.8 kg)   SpO2 98%   BMI 38.67 kg/m   Body mass index is 38.67 kg/m.  GENERAL: vitals reviewed and listed above, alert, oriented, appears well hydrated and in no acute distress HEENT: atraumatic, conjunctiva  clear, no obvious abnormalities on inspection of external nose and ears  NECK: no obvious masses on inspection palpation  LUNGS: clear to auscultation bilaterally, no wheezes, rales or rhonchi, good air movement CV: HRRR, no clubbing cyanosis or  peripheral edema nl cap refill  MS: moves all extremities without noticeable focal  abnormality favors mostly left knee  PSYCH: pleasant and cooperative,nl thought and speech Lab Results  Component Value Date   WBC 7.9 04/11/2024   HGB 15.1 (H) 04/11/2024   HCT 44.9 04/11/2024   PLT 257.0 04/11/2024   GLUCOSE 110 (H) 10/18/2024   CHOL 220 (H) 10/18/2024   TRIG 97.0 10/18/2024   HDL 51.80 10/18/2024   LDLDIRECT 160.7 12/10/2010   LDLCALC 148 (H) 10/18/2024   ALT 26 04/11/2024   AST 21 04/11/2024   NA 139 10/18/2024   K 4.2 10/18/2024   CL 104 10/18/2024   CREATININE 0.80 10/18/2024   BUN 20 10/18/2024   CO2 26 10/18/2024   TSH 1.84 04/11/2024   HGBA1C 6.2 10/18/2024   BP Readings from Last 3 Encounters:  10/24/24 112/68  10/10/24 129/66  09/11/24 113/71    ASSESSMENT AND PLAN:  Discussed the following assessment and plan:  Prediabetes  Medication management  Hyperlipidemia, unspecified hyperlipidemia type  Chronic pain of left knee  Mild intermittent asthma with acute exacerbation - Plan: budesonide -formoterol  (SYMBICORT ) 160-4.5 MCG/ACT inhaler  Stress due to illness of  family member Prediabetes  cont ls adaptation  Reviewed meds  and A1c  optimization ls difficult cause of  joint issues and family obligations  Refill symbicort  for season. Disc use of cymbalta as  used for OA pain other and at low dose less likely to cause se . ? If a candidate of hrt  However has an elevated ct calcium score   would have cardiology opine  on decision to add lipid lowering med . Counsel strategies  -Patient advised to return or notify health care team  if  new concerns arise.  Patient Instructions  Good to see you  today .  Sounds reasonable to try the low dose cymbalta  . May help pain and less se at low dose   Talk  to GYNE  consider   HRT .   Talk with cardiology about  adding lipid lowering medication also .   Roderic Lammert K. Colbe Viviano M.D.

## 2024-10-24 NOTE — Patient Instructions (Addendum)
 Good to see you  today .  Sounds reasonable to try the low dose cymbalta  . May help pain and less se at low dose   Talk  to GYNE  consider   HRT .   Talk with cardiology about  adding lipid lowering medication also .

## 2024-10-28 ENCOUNTER — Ambulatory Visit: Payer: Self-pay | Admitting: Internal Medicine

## 2024-11-06 DIAGNOSIS — M7541 Impingement syndrome of right shoulder: Secondary | ICD-10-CM | POA: Diagnosis not present

## 2024-11-06 DIAGNOSIS — M1712 Unilateral primary osteoarthritis, left knee: Secondary | ICD-10-CM | POA: Diagnosis not present

## 2024-11-15 ENCOUNTER — Encounter: Payer: Self-pay | Admitting: Internal Medicine

## 2024-11-15 ENCOUNTER — Telehealth: Payer: Self-pay | Admitting: *Deleted

## 2024-11-15 ENCOUNTER — Ambulatory Visit: Payer: Self-pay

## 2024-11-15 NOTE — Telephone Encounter (Signed)
 FYI Only or Action Required?: Action required by provider: request for appointment and clinical question for provider.  Patient was last seen in primary care on 10/24/2024 by Panosh, Apolinar POUR, MD.  Called Nurse Triage reporting Covid Positive.  Symptoms began several days ago.  Interventions attempted: Other: humidifier.  Symptoms are: gradually worsening.  Triage Disposition: Call PCP Within 24 Hours  Patient/caregiver understands and will follow disposition?: No, wishes to speak with PCP  Copied from CRM #[id number]. Topic: Clinical - Medical Advice >> Nov 15, 2024 12:43 PM Mercedes MATSU wrote: Reason for CRM: Patient called in stating that she is not feeling good, her parents were diagnosed with covid. Tested positive for covid with at home test. Patient reports sore throat, headache, cough, mucus, and nasal congestion. Patient is requesting medication or speak with a nurse and can be reached at [phone]. Reason for Disposition  [1] HIGH RISK patient (e.g., weak immune system, 65 years and older, obesity with BMI 30 or higher, pregnant, chronic lung disease) AND [2] COVID symptoms (e.g., cough, fever)  (Exceptions: Already seen by PCP and no new or worsening symptoms.)  Answer Assessment - Initial Assessment Questions Pt was at mother's on Saturday. Sunday mother became sick, Monday she was tested, Tuesday notified she was positive. Pt began to have scratchy throat on Tuesday. Then yesterday started with stuffy nose, productive cough, thick nasal congestion. She tested yesterday after work and it was immediately positive for covid. She also has a headache, sinus pressure. She has had one bloody nose but got her humidiifer up and going. Denies chest pain or shortness of breath. RN attempted to schedule with virtual urgent care d/t appt availability and pt declined. Pt states Dr. Charlett knows her well and she'd rather either see her or have her send something in for her. She does have asthma and  has not had to use inhalers yet.     1. SYMPTOMS: What is your main symptom or concern? (e.g., cough, fever, shortness of breath, muscle aches)     Cough, sore throat 2. ONSET: When did the symptoms start?      Tuesday 3. COUGH: Do you have a cough? If Yes, ask: How bad is the cough?       Yes, moderate 4. FEVER: Do you have a fever? If Yes, ask: What is your temperature, how was it measured, and when did it start?     denies 5. BREATHING DIFFICULTY: Are you having any difficulty breathing? (e.g., normal; shortness of breath, wheezing, unable to speak)      denies 6. BETTER-SAME-WORSE: Are you getting better, staying the same or getting worse compared to yesterday?  If getting worse, ask, In what way?     Worse than yesterday 7. OTHER SYMPTOMS: Do you have any other symptoms?  (e.g., chills, fatigue, headache, loss of smell or taste, muscle pain, sore throat)     Sore throat, headache, tired 8. COVID-19 DIAGNOSIS: How do you know that you have COVID? (e.g., positive lab test or self-test, diagnosed by doctor or NP/PA, symptoms after exposure).     Home test 9. COVID-19 EXPOSURE: Was there any known exposure to COVID before the symptoms began?      Yes, saturday 11. HIGH RISK DISEASE: Do you have any chronic medical problems? (e.g., asthma, heart or lung disease, weak immune system, obesity, etc.)       asthma  Protocols used: COVID-19 - Diagnosed or Suspected-A-AH

## 2024-11-15 NOTE — Telephone Encounter (Signed)
 Copied from CRM #[id number]. Topic: Clinical - Medical Advice >> Nov 14, 2024 11:18 AM Gail [name] wrote: Reason for CRM: Patient reports sore throat, headache, cough, and nasal congestion. Notes exposure to mother who tested positive for COVID-19 on Monday. Patient inquires about typical symptom onset timeframe. Patient is requesting a callback at [phone]

## 2024-11-16 ENCOUNTER — Encounter: Payer: Self-pay | Admitting: Family Medicine

## 2024-11-16 ENCOUNTER — Telehealth: Admitting: Family Medicine

## 2024-11-16 VITALS — Ht 60.0 in | Wt 190.0 lb

## 2024-11-16 DIAGNOSIS — U071 COVID-19: Secondary | ICD-10-CM | POA: Diagnosis not present

## 2024-11-16 DIAGNOSIS — R051 Acute cough: Secondary | ICD-10-CM

## 2024-11-16 MED ORDER — HYDROCODONE BIT-HOMATROP MBR 5-1.5 MG/5ML PO SOLN: 5.0000 mL | ORAL | 0 refills | Status: AC | PRN

## 2024-11-16 MED ORDER — NIRMATRELVIR/RITONAVIR (PAXLOVID)TABLET: 3.0000 | ORAL_TABLET | Freq: Two times a day (BID) | ORAL | 0 refills | Status: AC

## 2024-11-16 NOTE — Telephone Encounter (Signed)
 Spoke to pt. Scheduled pt an appt with Dr. Johnny this morning at 915am.

## 2024-11-16 NOTE — Telephone Encounter (Signed)
 Spoke to pt. Please see other phone encounter.

## 2024-11-16 NOTE — Telephone Encounter (Signed)
 Pt was scheduled with Dr. Johnny today.

## 2024-11-16 NOTE — Progress Notes (Signed)
 Subjective:    Patient ID: Gail [name], female    DOB: 04-06-1966, 58 y.o.   MRN: [id number]  HPI Virtual Visit via Video Note  I connected with the patient on 11/16/24 at  9:15 AM EST by a video enabled telemedicine application and verified that I am speaking with the correct person using two identifiers.  Location patient: home Location provider:work or home office Persons participating in the virtual visit: patient, provider  I discussed the limitations of evaluation and management by telemedicine and the availability of in person appointments. The patient expressed understanding and agreed to proceed.   HPI: Here for a Covid infection. She spent time last weekend with her mother who had a Covid infection. Then 4 days ago Pennie began to have low grade fevers, fatigue, stuffy head, and a dry cough. No SOB. She tested positive for Covd 2 days ago.    ROS: See pertinent positives and negatives per HPI.  Past Medical History:  Diagnosis Date   Anticoagulant long-term use    xarelto --- managed by dr g. taylor   Anxiety    Chronic pain of both shoulders    Dysrhythmia    Family history of adverse reaction to anesthesia    mother--- ponv   Heart murmur    History of anal fissures    History of infertility, female    History of kidney stones    Hyperlipidemia    Hypertension    IBS (irritable bowel syndrome)    Left ureteral stone    OA (osteoarthritis)    PAF (paroxysmal atrial fibrillation) (HCC)    PCO (polycystic ovaries)    PONV (postoperative nausea and vomiting)    RLS (restless legs syndrome)    Rosacea    Type 2 diabetes mellitus (HCC)     Past Surgical History:  Procedure Laterality Date   A-FLUTTER ABLATION N/A 07/16/2019   Procedure: A-FLUTTER ABLATION;  Surgeon: Waddell Danelle ORN, MD;  Location: [facility];  Service: Cardiovascular;  Laterality: N/A;   ATRIAL TACH ABLATION N/A 07/16/2019   Procedure: ATRIAL TACH ABLATION;  Surgeon: Waddell Danelle ORN, MD;  Location: [facility];  Service: Cardiovascular;  Laterality: N/A;   CARPAL TUNNEL RELEASE Bilateral 2006   HAND TENDON SURGERY Bilateral    bilateral thumb's   INTRAUTERINE DEVICE (IUD) INSERTION     KNEE ARTHROSCOPY Left 07/08/2010   @[facility]  by dr yvone   SHOULDER ARTHROSCOPY WITH ROTATOR CUFF REPAIR AND OPEN BICEPS TENODESIS Left 12/21/2021   @[facility]    WISDOM TOOTH EXTRACTION      Family History  Problem Relation Age of Onset   Hypertension Mother    Heart disease Mother    Hypertension Father    ALS Father        deceased   Dementia Maternal Grandfather    Heart disease Paternal Grandmother    Dementia Paternal Grandfather    Hypertension Other    Lung cancer Other        uncle   Breast cancer Other        great aunt   Colon cancer Neg Hx    Stomach cancer Neg Hx    Liver cancer Neg Hx    Rectal cancer Neg Hx    Esophageal cancer Neg Hx      Current Outpatient Medications:    albuterol  (VENTOLIN  HFA) 108 (90 Base) MCG/ACT inhaler, INHALE 2 PUFFS INTO THE LUNGS EVERY 6 HOURS AS NEEDED FOR WHEEZING OR SHORTNESS OF  BREATH, Disp: 6.7 g, Rfl: 1   budesonide -formoterol  (SYMBICORT ) 160-4.5 MCG/ACT inhaler, Inhale 2 puffs into the lungs 2 (two) times daily., Disp: 10.2 g, Rfl: 2   diltiazem  (CARDIZEM  CD) 360 MG 24 hr capsule, Take 1 capsule by mouth daily. Please keep upcoming appointment in May 2024 with Dr. Waddell before any more refills., Disp: 90 capsule, Rfl: 1   fluticasone  (FLONASE ) 50 MCG/ACT nasal spray, 2 sprays per nostril daily.  MUST HAVE OFFICE VISIT FOR FURTHER REFILLS. (Patient taking differently: as needed. 2 sprays per nostril daily.), Disp: 16 g, Rfl: 0   gabapentin  (NEURONTIN ) 300 MG capsule, SMARTSIG:1 Capsule(s) By Mouth 1-3 Times Daily PRN, Disp: , Rfl:    HYDROcodone  bit-homatropine (HYCODAN) 5-1.5 MG/5ML syrup, Take 5 mLs by mouth every 6 (six) hours as needed for cough., Disp: 120 mL, Rfl: 0   Lactobacillus (ACIDOPHILUS PO), Take 1  capsule by mouth at bedtime., Disp: , Rfl:    levonorgestrel  (MIRENA , 52 MG,) 20 MCG/24HR IUD, 1 each by Intrauterine route once., Disp: , Rfl:    losartan  (COZAAR ) 50 MG tablet, TAKE 1 TABLET BY MOUTH EVERY DAY, Disp: 90 tablet, Rfl: 2   metFORMIN  (GLUCOPHAGE -XR) 500 MG 24 hr tablet, TAKE 1 TABLET(500 MG) BY MOUTH DAILY WITH BREAKFAST, Disp: 90 tablet, Rfl: 0   metroNIDAZOLE  (METROCREAM ) 0.75 % cream, Apply topically., Disp: , Rfl:    NON FORMULARY, Pt uses a cpap nightly, Disp: , Rfl:    nystatin  ointment (MYCOSTATIN ), Apply 1 Application topically daily. (Patient taking differently: Apply 1 Application topically as needed.), Disp: 30 g, Rfl: 0   Spacer/Aero-Holding Chambers (AEROCHAMBER PLUS) Device, Use bid with inhalers., Disp: 1 each, Rfl: 0   Spacer/Aero-Holding Chambers DEVI, Inhale 1 Inhalation into the lungs 2 (two) times daily., Disp: 1 Device, Rfl: 0   vitamin B-12 (CYANOCOBALAMIN ) 1000 MCG tablet, Take 1,000 mcg by mouth daily., Disp: , Rfl:    XARELTO  20 MG TABS tablet, TAKE 1 TABLET BY MOUTH DAILY WITH SUPPER, Disp: 30 tablet, Rfl: 5   Zinc Oxide 30.6 % CREA, Apply 1 application topically every morning., Disp: , Rfl:    cyclobenzaprine  (FLEXERIL ) 10 MG tablet, Take 10 mg by mouth every 8 (eight) hours as needed. (Patient not taking: Reported on 11/16/2024), Disp: , Rfl:    DULoxetine  (CYMBALTA ) 20 MG capsule, Take 1 capsule (20 mg total) by mouth daily. (Patient not taking: Reported on 11/16/2024), Disp: 30 capsule, Rfl: 2   HYDROcodone -acetaminophen  (NORCO/VICODIN) 5-325 MG tablet, 1 tablet every 6 (six) hours as needed. (Patient not taking: Reported on 11/16/2024), Disp: , Rfl:    hydrOXYzine  (ATARAX ) 25 MG tablet, Take 1 tablet (25 mg total) by mouth at bedtime as needed. (Patient not taking: Reported on 11/16/2024), Disp: 30 tablet, Rfl: 1   traMADol (ULTRAM) 50 MG tablet, Take 50 mg by mouth every 12 (twelve) hours as needed. (Patient not taking: Reported on 11/16/2024), Disp: , Rfl:     triamcinolone cream (KENALOG) 0.1 %, APPLY ON THE SKIN BID PRN (Patient not taking: Reported on 11/16/2024), Disp: , Rfl:   EXAM:  VITALS per patient if applicable:  GENERAL: alert, oriented, appears well and in no acute distress  HEENT: atraumatic, conjunttiva clear, no obvious abnormalities on inspection of external nose and ears  NECK: normal movements of the head and neck  LUNGS: on inspection no signs of respiratory distress, breathing rate appears normal, no obvious gross SOB, gasping or wheezing  CV: no obvious cyanosis  MS: moves all visible extremities without  noticeable abnormality  PSYCH/NEURO: pleasant and cooperative, no obvious depression or anxiety, speech and thought processing grossly intact  ASSESSMENT AND PLAN: Covid infection, treat with 5 days of Paxlovid .  Garnette Olmsted, MD  Discussed the following assessment and plan:  Acute cough     I discussed the assessment and treatment plan with the patient. The patient was provided an opportunity to ask questions and all were answered. The patient agreed with the plan and demonstrated an understanding of the instructions.   The patient was advised to call back or seek an in-person evaluation if the symptoms worsen or if the condition fails to improve as anticipated.      Review of Systems     Objective:   Physical Exam        Assessment & Plan:

## 2024-11-19 ENCOUNTER — Other Ambulatory Visit: Payer: Self-pay | Admitting: Internal Medicine

## 2024-11-19 DIAGNOSIS — I480 Paroxysmal atrial fibrillation: Secondary | ICD-10-CM

## 2024-11-20 ENCOUNTER — Other Ambulatory Visit: Payer: Self-pay | Admitting: Family

## 2024-11-20 DIAGNOSIS — J989 Respiratory disorder, unspecified: Secondary | ICD-10-CM

## 2024-11-20 NOTE — Telephone Encounter (Signed)
 Prescription refill request for Xarelto  received.  Indication:afib Last office visit:7/25 Weight:86.2  kg Age:58 Scr:0.80  11/25 CrCl:106.85  ml/min  Prescription refilled

## 2024-11-22 ENCOUNTER — Telehealth: Payer: Self-pay | Admitting: Internal Medicine

## 2024-11-22 DIAGNOSIS — I480 Paroxysmal atrial fibrillation: Secondary | ICD-10-CM

## 2024-11-22 NOTE — Telephone Encounter (Signed)
 PT called in    Needs refills called in on Dilt CD 360 mg

## 2024-11-23 ENCOUNTER — Other Ambulatory Visit (HOSPITAL_COMMUNITY): Payer: Self-pay

## 2024-11-23 ENCOUNTER — Telehealth: Payer: Self-pay | Admitting: Pharmacy Technician

## 2024-11-23 MED ORDER — DILTIAZEM HCL ER BEADS 360 MG PO CP24: 360.0000 mg | ORAL_CAPSULE | Freq: Every day | ORAL | 3 refills | Status: AC

## 2024-11-23 MED ORDER — DILTIAZEM HCL ER COATED BEADS 360 MG PO CP24: ORAL_CAPSULE | ORAL | 3 refills | Status: DC

## 2024-11-23 NOTE — Telephone Encounter (Signed)
 Cardizem  CD is not covered under patients plan--  Plan prefers Tiazac  at 45.00 for 3 months- asking provider to switch

## 2024-11-23 NOTE — Addendum Note (Signed)
 Addended by: VICCI ROXIE CROME on: 11/23/2024 08:00 AM   Modules accepted: Orders

## 2024-11-23 NOTE — Telephone Encounter (Signed)
 Refill for diltiazem  360 mg daily sent to [facility] pharmacy.

## 2024-11-23 NOTE — Addendum Note (Signed)
 Addended by: Gillermo Poch D on: 11/23/2024 04:24 PM   Modules accepted: Orders

## 2024-11-26 ENCOUNTER — Other Ambulatory Visit: Payer: Self-pay

## 2024-11-26 DIAGNOSIS — Z79899 Other long term (current) drug therapy: Secondary | ICD-10-CM

## 2024-11-26 MED ORDER — ROSUVASTATIN CALCIUM 10 MG PO TABS: 10.0000 mg | ORAL_TABLET | Freq: Every day | ORAL | 3 refills | Status: AC

## 2024-11-26 NOTE — Telephone Encounter (Signed)
 Labs and crestor  ordered

## 2024-12-21 ENCOUNTER — Telehealth: Payer: Self-pay | Admitting: Internal Medicine

## 2024-12-21 NOTE — Telephone Encounter (Signed)
"  ° °  Pre-operative Risk Assessment    Patient Name: [name]  DOB: 1966/07/12 MRN: [id number]   Date of last office visit: 06/28/24 DR. GREGG TAYLOR Date of next office visit: NONE   Request for Surgical Clearance     Procedure:  LEFT TKR  Date of Surgery:  Clearance TBD                                Surgeon:  DR. NORLEEN GAVEL Surgeon's Group or Practice Name:  [facility] Phone number:  [phone] JUDY DANIELS Fax number:  [phone]   Type of Clearance Requested:   - Medical  - Pharmacy:  Hold Rivaroxaban  (Xarelto )     Type of Anesthesia:  Spinal   Additional requests/questions:    Gail [name]   12/21/2024, 4:15 PM  . "

## 2024-12-21 NOTE — Telephone Encounter (Signed)
 Patient needs her preoperative risk assessment completed and faxed over to Dagoberto Kipper at [phone].

## 2024-12-24 NOTE — Telephone Encounter (Signed)
 Spoke with patient regarding pre op assessment. Per Dr. Okey: OK to hold Xarelto  prior to upcoming surgery     Resume when safe after (per surgery) Update given to patient  Pt also asked if form could be faxed over to [facility]. Sent fax at [phone] to [phone] Pt verbalizes understanding of plan.

## 2024-12-24 NOTE — Telephone Encounter (Signed)
 OK to hold Xarelto  prior to upcoming surgery     Resume when safe after (per surgery)

## 2024-12-25 ENCOUNTER — Telehealth: Payer: Self-pay

## 2024-12-25 NOTE — Telephone Encounter (Signed)
 Pt dropped off a surgical clearance form to be fill.   Pt was last seen on 10/24/2024. Does pt needs an appointment.   Pt was clear from cardiology yesterday. Form placed in provider's red folder.

## 2024-12-27 ENCOUNTER — Telehealth: Payer: Self-pay

## 2024-12-27 NOTE — Telephone Encounter (Signed)
Patient has been scheduled for televisit.

## 2024-12-27 NOTE — Telephone Encounter (Signed)
" ° °  Name: Gail [name]  DOB: Mar 13, 1966  MRN: [id number]  Primary Cardiologist: Gail Birmingham, MD   Preoperative team, please contact this patient and set up a phone call appointment for further preoperative risk assessment. Please obtain consent and complete medication review. Thank you for your help.  I confirm that guidance regarding antiplatelet and oral anticoagulation therapy has been completed and, if necessary, noted below.  Patient has not had an Afib/aflutter ablation in the last 3 months, DCCV within the last 4 weeks or a watchman implanted in the last 45 days    Per office protocol, patient can hold Xarelto  for 3 days prior to procedure.    I also confirmed the patient resides in the state of [place] . As per [facility] Medical Board telemedicine laws, the patient must reside in the state in which the provider is licensed.   Gail LITTIE Louis, NP 12/27/2024, 3:10 PM [facility]    "

## 2024-12-27 NOTE — Telephone Encounter (Signed)
 Patient has been scheduled for televisit med red and consent done     Patient Consent for Virtual Visit         Gail [name] has provided verbal consent on 12/27/2024 for a virtual visit (video or telephone).   CONSENT FOR VIRTUAL VISIT FOR:  Gail [name]  By participating in this virtual visit I agree to the following:  I hereby voluntarily request, consent and authorize [facility] and its employed or contracted physicians, physician assistants, nurse practitioners or other licensed health care professionals (the Practitioner), to provide me with telemedicine health care services (the Services) as deemed necessary by the treating Practitioner. I acknowledge and consent to receive the Services by the Practitioner via telemedicine. I understand that the telemedicine visit will involve communicating with the Practitioner through live audiovisual communication technology and the disclosure of certain medical information by electronic transmission. I acknowledge that I have been given the opportunity to request an in-person assessment or other available alternative prior to the telemedicine visit and am voluntarily participating in the telemedicine visit.  I understand that I have the right to withhold or withdraw my consent to the use of telemedicine in the course of my care at any time, without affecting my right to future care or treatment, and that the Practitioner or I may terminate the telemedicine visit at any time. I understand that I have the right to inspect all information obtained and/or recorded in the course of the telemedicine visit and may receive copies of available information for a reasonable fee.  I understand that some of the potential risks of receiving the Services via telemedicine include:  Delay or interruption in medical evaluation due to technological equipment failure or disruption; Information transmitted may not be sufficient (e.g. poor resolution of  images) to allow for appropriate medical decision making by the Practitioner; and/or  In rare instances, security protocols could fail, causing a breach of personal health information.  Furthermore, I acknowledge that it is my responsibility to provide information about my medical history, conditions and care that is complete and accurate to the best of my ability. I acknowledge that Practitioner's advice, recommendations, and/or decision may be based on factors not within their control, such as incomplete or inaccurate data provided by me or distortions of diagnostic images or specimens that may result from electronic transmissions. I understand that the practice of medicine is not an exact science and that Practitioner makes no warranties or guarantees regarding treatment outcomes. I acknowledge that a copy of this consent can be made available to me via my patient portal [facility] MyChart), or I can request a printed copy by calling the office of [facility].    I understand that my insurance will be billed for this visit.   I have read or had this consent read to me. I understand the contents of this consent, which adequately explains the benefits and risks of the Services being provided via telemedicine.  I have been provided ample opportunity to ask questions regarding this consent and the Services and have had my questions answered to my satisfaction. I give my informed consent for the services to be provided through the use of telemedicine in my medical care

## 2024-12-27 NOTE — Telephone Encounter (Signed)
 Patient with diagnosis of aflutter on Xarelto  for anticoagulation.    Procedure: LEFT TKR  Date of procedure: TBD   CHA2DS2-VASc Score = 3   This indicates a 3.2% annual risk of stroke. The patient's score is based upon: CHF History: 0 HTN History: 1 Diabetes History: 1 Stroke History: 0 Vascular Disease History: 0 Age Score: 0 Gender Score: 1      CrCl 76.5 ml/min Platelet count 257  Patient has not had an Afib/aflutter ablation in the last 3 months, DCCV within the last 4 weeks or a watchman implanted in the last 45 days   Per office protocol, patient can hold Xarelto  for 3 days prior to procedure.    **This guidance is not considered finalized until pre-operative APP has relayed final recommendations.**

## 2024-12-28 ENCOUNTER — Telehealth: Payer: Self-pay | Admitting: Internal Medicine

## 2024-12-28 NOTE — Telephone Encounter (Signed)
 Copied from CRM [id number]. Topic: General - Other >> Dec 28, 2024  2:19 PM Delon DASEN wrote: Reason for CRM: Patient checking status of surgical clearance/risk assessment- [phone]

## 2025-01-01 NOTE — Telephone Encounter (Signed)
 Medical clearance signed . Cardiology to advise about xeralto and cardiac status.

## 2025-01-01 NOTE — Telephone Encounter (Signed)
 Reach out to pt. Inform pt of provider's message below. Pt verbalized understanding and states she has f/u with her cardiology tomorrow.  Pt states she would like to discuss medication and constipation with Dr. Charlett before the surgery. A virtual appt is set with Dr. Charlett.   Forwarding to Dr. Charlett

## 2025-01-02 ENCOUNTER — Ambulatory Visit: Admitting: Student

## 2025-01-02 DIAGNOSIS — Z01810 Encounter for preprocedural cardiovascular examination: Secondary | ICD-10-CM

## 2025-01-02 NOTE — Telephone Encounter (Signed)
 I signed   form and gave to you previously

## 2025-01-02 NOTE — Progress Notes (Signed)
 "   Virtual Visit via Telephone Note   Because of Gail [name]'s co-morbid illnesses, she is at least at moderate risk for complications without adequate follow up.  This format is felt to be most appropriate for this patient at this time.  The patient did not have access to video technology/had technical difficulties with video requiring transitioning to audio format only (telephone).  All issues noted in this document were discussed and addressed.  No physical exam could be performed with this format.  Please refer to the patient's chart for her consent to telehealth for [facility]  Evaluation Performed:  Preoperative cardiovascular risk assessment _____________   Date:  01/02/2025   Patient ID:  Gail [name], DOB 03/10/1966, MRN [id number] Patient Location:  Home Provider location:   Office  Primary Care Provider:  Charlett Apolinar POUR, MD  Primary Cardiologist:  [facility] Providers Cardiologist:  Danelle Birmingham, MD Electrophysiologist:  Danelle Birmingham, MD    Chief Complaint / Patient Profile   59 y.o. y/o female with a h/o PAF/a-flutter s/p ablation August 2020 on anticoagulation, hypertension, hyperlipidemia, OSA, asthma who is pending left TKR by Dr. Yvone and presents today for telephonic preoperative cardiovascular risk assessment.  History of Present Illness    Gail [name] is a 59 y.o. female who presents via audio/video conferencing for a telehealth visit today.  Pt was last seen in cardiology clinic on 06/28/2024 by Dr. Birmingham.  At that time Gail [name] was stable from a cardiac standpoint.  The patient is now pending procedure as outlined above. Since her last visit, she is doing well. Patient denies shortness of breath, dyspnea on exertion, orthopnea or PND. She has occasional lower extremity edema that is not present in the morning and progresses throughout the day. No chest pain, pressure, or tightness. She has occasional palpitations that are  brief. She has cardiac awareness of arrhythmia. She checks on her rhythm with her smart watch. She does not participate in formal exercise secondary to knee and back pain. She is independent with ADLs and is able to perform light to moderate household activities. She is a manufacturing systems engineer and spends her days up and down and active with the children.     Past Medical History    Past Medical History:  Diagnosis Date   Anticoagulant long-term use    xarelto --- managed by dr g. taylor   Anxiety    Chronic pain of both shoulders    Dysrhythmia    Family history of adverse reaction to anesthesia    mother--- ponv   Heart murmur    History of anal fissures    History of infertility, female    History of kidney stones    Hyperlipidemia    Hypertension    IBS (irritable bowel syndrome)    Left ureteral stone    OA (osteoarthritis)    PAF (paroxysmal atrial fibrillation) (HCC)    PCO (polycystic ovaries)    PONV (postoperative nausea and vomiting)    RLS (restless legs syndrome)    Rosacea    Type 2 diabetes mellitus (HCC)    Past Surgical History:  Procedure Laterality Date   A-FLUTTER ABLATION N/A 07/16/2019   Procedure: A-FLUTTER ABLATION;  Surgeon: Birmingham Danelle ORN, MD;  Location: [facility];  Service: Cardiovascular;  Laterality: N/A;   ATRIAL TACH ABLATION N/A 07/16/2019   Procedure: ATRIAL TACH ABLATION;  Surgeon: Birmingham Danelle ORN, MD;  Location: [facility];  Service: Cardiovascular;  Laterality: N/A;   CARPAL TUNNEL RELEASE Bilateral 2006   HAND TENDON SURGERY Bilateral    bilateral thumb's   INTRAUTERINE DEVICE (IUD) INSERTION     KNEE ARTHROSCOPY Left 07/08/2010   @[facility]  by dr graves   SHOULDER ARTHROSCOPY WITH ROTATOR CUFF REPAIR AND OPEN BICEPS TENODESIS Left 12/21/2021   @[facility]    WISDOM TOOTH EXTRACTION      Allergies  Allergies[1]  Home Medications    Prior to Admission medications  Medication Sig Start Date End Date Taking? Authorizing Provider   albuterol  (VENTOLIN  HFA) 108 (90 Base) MCG/ACT inhaler INHALE 2 PUFFS INTO THE LUNGS EVERY 6 HOURS AS NEEDED FOR WHEEZING OR SHORTNESS OF BREATH 11/22/24   Panosh, Apolinar POUR, MD  budesonide -formoterol  (SYMBICORT ) 160-4.5 MCG/ACT inhaler Inhale 2 puffs into the lungs 2 (two) times daily. 10/24/24   Panosh, Wanda K, MD  cyclobenzaprine  (FLEXERIL ) 10 MG tablet Take 10 mg by mouth every 8 (eight) hours as needed. Patient not taking: Reported on 11/16/2024 07/20/24   [provider]  diltiazem  (TIAZAC ) 360 MG 24 hr capsule Take 1 capsule (360 mg total) by mouth daily. 11/23/24   Okey Vina GAILS, MD  DULoxetine  (CYMBALTA ) 20 MG capsule Take 1 capsule (20 mg total) by mouth daily. Patient not taking: Reported on 11/16/2024 10/10/24   Jeannetta Lonni ORN, MD  fluticasone  (FLONASE ) 50 MCG/ACT nasal spray 2 sprays per nostril daily.  MUST HAVE OFFICE VISIT FOR FURTHER REFILLS. Patient taking differently: as needed. 2 sprays per nostril daily. 06/29/13   Panosh, Wanda K, MD  gabapentin  (NEURONTIN ) 300 MG capsule SMARTSIG:1 Capsule(s) By Mouth 1-3 Times Daily PRN 05/05/23   [provider]  HYDROcodone  bit-homatropine (HYCODAN) 5-1.5 MG/5ML syrup Take 5 mLs by mouth every 6 (six) hours as needed for cough. 12/12/23   Merlynn Niki FALCON, FNP  HYDROcodone  bit-homatropine (HYCODAN) 5-1.5 MG/5ML syrup Take 5 mLs by mouth every 4 (four) hours as needed. 11/16/24   Johnny Garnette LABOR, MD  HYDROcodone -acetaminophen  (NORCO/VICODIN) 5-325 MG tablet 1 tablet every 6 (six) hours as needed. Patient not taking: Reported on 11/16/2024    [provider]  hydrOXYzine  (ATARAX ) 25 MG tablet Take 1 tablet (25 mg total) by mouth at bedtime as needed. Patient not taking: Reported on 11/16/2024 04/11/24   Panosh, Wanda K, MD  Lactobacillus (ACIDOPHILUS PO) Take 1 capsule by mouth at bedtime.    [provider]  levonorgestrel  (MIRENA , 52 MG,) 20 MCG/24HR IUD 1 each by Intrauterine route once.    [provider]  losartan  (COZAAR ) 50 MG tablet TAKE 1 TABLET BY MOUTH EVERY DAY 10/10/24   Waddell Danelle ORN, MD  metFORMIN  (GLUCOPHAGE -XR) 500 MG 24 hr tablet TAKE 1 TABLET(500 MG) BY MOUTH DAILY WITH BREAKFAST 11/21/24   Panosh, Wanda K, MD  metroNIDAZOLE  (METROCREAM ) 0.75 % cream Apply topically. 04/04/24   [provider]  NON FORMULARY Pt uses a cpap nightly    [provider]  nystatin  ointment (MYCOSTATIN ) Apply 1 Application topically daily. Patient taking differently: Apply 1 Application topically as needed. 12/27/22   Webb, Padonda B, FNP  rosuvastatin  (CRESTOR ) 10 MG tablet Take 1 tablet (10 mg total) by mouth daily. 11/26/24 02/24/25  Waddell Danelle ORN, MD  Spacer/Aero-Holding Chambers (AEROCHAMBER PLUS) Device Use bid with inhalers. 12/21/23   Jordan, Betty G, MD  Spacer/Aero-Holding Chambers DEVI Inhale 1 Inhalation into the lungs 2 (two) times daily. 12/21/23   Jordan, Betty G, MD  traMADol (ULTRAM) 50 MG tablet Take 50  mg by mouth every 12 (twelve) hours as needed. Patient not taking: Reported on 11/16/2024 10/27/22   [provider]  triamcinolone cream (KENALOG) 0.1 % APPLY ON THE SKIN BID PRN Patient not taking: Reported on 11/16/2024    [provider]  vitamin B-12 (CYANOCOBALAMIN ) 1000 MCG tablet Take 1,000 mcg by mouth daily.    [provider]  XARELTO  20 MG TABS tablet TAKE 1 TABLET BY MOUTH DAILY WITH SUPPER 11/20/24   Waddell Danelle ORN, MD  Zinc Oxide 30.6 % CREA Apply 1 application topically every morning.    [provider]    Physical Exam    Vital Signs:  Gail [name] does not have vital signs available for review today.  Given telephonic nature of communication, physical exam is limited. AAOx3. NAD. Normal affect.  Speech and respirations are unlabored.   Assessment & Plan    Preoperative cardiovascular risk assessment. Left TKR by Dr. Yvone  Chart reviewed as part of pre-operative protocol coverage. According to  the RCRI, patient has a 0.4% risk of MACE. Patient reports activity equivalent to 4.4 METS (per DASI).   Given past medical history and time since last visit, based on ACC/AHA guidelines, Gail [name] would be at acceptable risk for the planned procedure without further cardiovascular testing.   Patient was advised that if she develops new symptoms prior to surgery to contact our office to arrange a follow-up appointment.  she verbalized understanding.  Per Pharm D, patient has not had an Afib/aflutter ablation within the last 90 days, DCCV within the last 4 weeks, or Watchman in the last 45 days. Patient may hold Xarelto  for 3 days prior to procedure.    I will route this recommendation to the requesting party via Epic fax function.  Please call with questions.  Time:   Today, I have spent 10 minutes with the patient with telehealth technology discussing medical history, symptoms, and management plan.     Gail Hila, NP  01/02/2025, 8:14 AM     [1]  Allergies Allergen Reactions   Ace Inhibitors Cough   Erythromycin  Ethylsuccinate     Stomach hurts REACTION: unspecified   can take azithromycin    Doxycycline  Rash   Latex Rash   "

## 2025-01-02 NOTE — Telephone Encounter (Signed)
 Form was fax yesterday.

## 2025-01-08 ENCOUNTER — Other Ambulatory Visit: Payer: Self-pay

## 2025-01-08 DIAGNOSIS — J4521 Mild intermittent asthma with (acute) exacerbation: Secondary | ICD-10-CM

## 2025-01-08 NOTE — Telephone Encounter (Signed)
 Contacted pt regards to Symbicort  from [employer]. Pt reports she is good on the refill as for right now. She states she never used [facility] and is using [employer].   Pt states she is needing a refill on Gabapentin  but she get the refills from her Ortho.   Pt does not need anything at this time.

## 2025-01-09 NOTE — Progress Notes (Unsigned)
 "  Office Visit Note  Patient: Gail [name]             Date of Birth: 16-Aug-1966           MRN: [id number]             PCP: Charlett Apolinar POUR, MD Referring: Charlett Apolinar POUR, MD Visit Date: 01/16/2025   Subjective:  No chief complaint on file.   History of Present Illness: Gail [name] is a 59 y.o. female here for follow up for ongoing chronic joint pain Ophteis including both hands and her positive ANA labs.    Previous HPI 10/10/2024 Gail [name] is a 59 y.o. female here for follow up for ongoing chronic joint pain Ophteis including both hands and her positive ANA labs.  Workup at her initial visit was negative for double-stranded DNA Smith antibodies had normal sedimentation rate.  Complement C3 was slightly high at 223 which would not be indicative for systemic lupus.    We had discussed possibly starting a neuropathy agent for chronic widespread osteoarthritis related pain such as Cymbalta  which she would be interested in at this time.  She is limited in other as needed options for joint inflammation and pain due to Xarelto  for A-fib.   She has had multiple trigger finger injections with Dr. Sebastian.  These have not been very definitively effective which she is very reticent to pursue any additional surgical corrections.     Labs reviewed 09/2022 Urinalysis Microscopic hematuria Mixed urogenital flora Increased urine calcium  and calcium  oxalate     Previous HPI 09/11/24 Gail [name] is a 59 year old female with osteoarthritis who presents with hand arthritis, recurrent triggering, and contracture problems. She was referred by her primary care doctor for evaluation of abnormal lab tests positive ANA and elevated sed rate.   She experiences hand arthritis with recurrent triggering and contracture problems. Multiple corticosteroid injections in her fingers provide temporary relief for about three months. She has not undergone surgical intervention yet.   There is a  history of abnormal lab tests, including a low positive ANA test and a slight elevation in sedimentation rate. Additionally, she has microscopic hematuria with trace amounts of red blood cells in urine testing.   She experiences knee pain and acknowledges the need for a knee replacement. She manages knee pain with gabapentin , taken at 300 mg at night. Tingling in her legs and feet occurs, particularly when standing for long periods.   She has a history of dry mouth and dry skin, managed with Betaine mouth spray and lotion. She previously used Restasis for dry eyes but switched to Systane Complete with good results.   She has a history of rosacea and perioral dermatitis, for which she uses topical creams. There is also a history of skin cancer on her face previously excision biopsy.   She has a history of plantar fasciitis and has received injections for this condition with limited benefit. Swelling in her legs occurs, particularly at night.   She has a history of PCOS, diagnosed when she attempted to conceive. She uses a Mirena  IUD to manage menstrual irregularities and has not had a period for the past year.   She experiences hot flashes and joint pain, which she associates with menopause. She has not been on hormone replacement therapy but notes that the Mirena  IUD may help mitigate some symptoms. Labs with GYN office confirmatory with FSH 31.   There is a family history of osteoarthritis,  believed to be inherited from her father's side. She deals with osteoarthritis in her hands and knees, impacting her daily activities.    Labs reviewed 03/2024 ANA 1:80 nucleolar SSA, SSB neg CCP neg ESR 33 CRP wnl   No Rheumatology ROS completed.   PMFS History:  Patient Active Problem List   Diagnosis Date Noted   Generalized osteoarthritis 09/11/2024   Positive ANA (antinuclear antibody) 09/11/2024   Peripheral edema 09/11/2024   Plantar fasciitis 09/11/2024   Menopause 09/11/2024    Paroxysmal atrial fibrillation (HCC) 05/28/2020   Atrial flutter (HCC) 06/26/2019   OSA (obstructive sleep apnea) 05/04/2017   Hypersomnia 01/03/2017   Low normal vitamin B12 level 07/07/2015   Axillary lump 09/18/2014   Intermittent sleepiness 09/18/2014   Flushes 04/17/2014   Anxiety in acute stress reaction 03/13/2014   Teeth grinding 07/06/2013   Head pain left   07/06/2013   Metabolic syndrome 10/08/2012   Carbuncle 09/29/2012   Rosacea 09/29/2012   Chronic cough 05/03/2012   Hearing difficulty 02/25/2012   Eustachian tube dysfunction 02/25/2012   Heart palpitations 07/07/2011   Neck pain 04/01/2011   Back pain 04/01/2011   Obesity 01/05/2011   ASTHMA 12/29/2009   HYPERGLYCEMIA, BORDERLINE 11/25/2009   PALPITATIONS, RECURRENT 09/23/2009   TOBACCO USE, QUIT 09/23/2009   ARTHRITIS 04/30/2008   POLYCYSTIC OVARIES 10/16/2007   FEMALE INFERTILITY 10/16/2007   HYPERLIPIDEMIA 07/18/2007   Essential hypertension 07/18/2007    Past Medical History:  Diagnosis Date   Anticoagulant long-term use    xarelto --- managed by dr g. taylor   Anxiety    Chronic pain of both shoulders    Dysrhythmia    Family history of adverse reaction to anesthesia    mother--- ponv   Heart murmur    History of anal fissures    History of infertility, female    History of kidney stones    Hyperlipidemia    Hypertension    IBS (irritable bowel syndrome)    Left ureteral stone    OA (osteoarthritis)    PAF (paroxysmal atrial fibrillation) (HCC)    PCO (polycystic ovaries)    PONV (postoperative nausea and vomiting)    RLS (restless legs syndrome)    Rosacea    Type 2 diabetes mellitus (HCC)     Family History  Problem Relation Age of Onset   Hypertension Mother    Heart disease Mother    Hypertension Father    ALS Father        deceased   Dementia Maternal Grandfather    Heart disease Paternal Grandmother    Dementia Paternal Grandfather    Hypertension Other    Lung cancer Other         uncle   Breast cancer Other        great aunt   Colon cancer Neg Hx    Stomach cancer Neg Hx    Liver cancer Neg Hx    Rectal cancer Neg Hx    Esophageal cancer Neg Hx    Past Surgical History:  Procedure Laterality Date   A-FLUTTER ABLATION N/A 07/16/2019   Procedure: A-FLUTTER ABLATION;  Surgeon: Waddell Danelle ORN, MD;  Location: [facility];  Service: Cardiovascular;  Laterality: N/A;   ATRIAL TACH ABLATION N/A 07/16/2019   Procedure: ATRIAL TACH ABLATION;  Surgeon: Waddell Danelle ORN, MD;  Location: [facility];  Service: Cardiovascular;  Laterality: N/A;   CARPAL TUNNEL RELEASE Bilateral 2006   HAND TENDON SURGERY Bilateral  bilateral thumb's   INTRAUTERINE DEVICE (IUD) INSERTION     KNEE ARTHROSCOPY Left 07/08/2010   @[facility]  by dr graves   SHOULDER ARTHROSCOPY WITH ROTATOR CUFF REPAIR AND OPEN BICEPS TENODESIS Left 12/21/2021   @[facility]    WISDOM TOOTH EXTRACTION     Social History   Social History Narrative   Married   Pt doesn't get reg exercise   New pet puppy   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   Immunization History  Administered Date(s) Administered   Influenza Split 09/13/2011, 09/12/2012, 09/12/2013, 09/23/2017   Influenza,inj,Quad PF,6+ Mos 09/20/2016, 10/04/2018, 09/13/2019, 09/26/2020   Influenza-Unspecified 09/12/2014, 10/03/2018, 09/12/2019   PFIZER(Purple Top)SARS-COV-2 Vaccination 04/17/2020, 05/08/2020   PPD Test 08/08/2018   Td 12/13/1996, 11/25/2009     Objective: Vital Signs: There were no vitals taken for this visit.   Physical Exam   Musculoskeletal Exam: ***   Investigation: No additional findings.  Imaging: No results found.  Recent Labs: Lab Results  Component Value Date   WBC 7.9 04/11/2024   HGB 15.1 (H) 04/11/2024   PLT 257.0 04/11/2024   NA 139 10/18/2024   K 4.2 10/18/2024   CL 104 10/18/2024   CO2 26 10/18/2024    GLUCOSE 110 (H) 10/18/2024   BUN 20 10/18/2024   CREATININE 0.80 10/18/2024   BILITOT 0.4 04/11/2024   ALKPHOS 99 04/11/2024   AST 21 04/11/2024   ALT 26 04/11/2024   PROT 7.6 04/11/2024   ALBUMIN 4.5 04/11/2024   CALCIUM  9.1 10/18/2024   GFRAA 95 07/13/2019    Speciality Comments: No specialty comments available.  Procedures:  No procedures performed Allergies: Ace inhibitors, Erythromycin  ethylsuccinate, Doxycycline , and Latex   Assessment / Plan:     Visit Diagnoses:  Assessment & Plan Generalized osteoarthritis      ***  Follow-Up Instructions: No follow-ups on file.   Suetta Hoffmeister M Aeson Sawyers, CMA  Note - This record has been created using Animal nutritionist.  Chart creation errors have been sought, but may not always  have been located. Such creation errors do not reflect on  the standard of medical care. "

## 2025-01-09 NOTE — Assessment & Plan Note (Signed)
 Gail [name]

## 2025-01-16 ENCOUNTER — Telehealth: Admitting: Internal Medicine

## 2025-01-16 ENCOUNTER — Ambulatory Visit: Admitting: Internal Medicine

## 2025-01-16 ENCOUNTER — Encounter: Payer: Self-pay | Admitting: Internal Medicine

## 2025-01-16 VITALS — Ht 60.0 in | Wt 193.0 lb

## 2025-01-16 DIAGNOSIS — M159 Polyosteoarthritis, unspecified: Secondary | ICD-10-CM

## 2025-01-16 DIAGNOSIS — R8290 Unspecified abnormal findings in urine: Secondary | ICD-10-CM

## 2025-01-16 DIAGNOSIS — Z7901 Long term (current) use of anticoagulants: Secondary | ICD-10-CM

## 2025-01-16 DIAGNOSIS — K5900 Constipation, unspecified: Secondary | ICD-10-CM

## 2025-01-16 MED ORDER — NYSTATIN 100000 UNIT/GM EX OINT: 1.0000 | TOPICAL_OINTMENT | Freq: Every day | CUTANEOUS | 0 refills | Status: AC

## 2025-01-16 NOTE — Progress Notes (Signed)
 " Virtual Visit via Video Note  I connected with Gail [name] on 01/16/25 at  4:00 PM EST by a video enabled telemedicine application and verified that I am speaking with the correct person using two identifiers. Location patient: vehicle Location provider:work office Persons participating in the virtual visit: patient, provider   Patient aware  of the limitations of evaluation and management by telemedicine and  availability of in person appointments. and agreed to proceed.   HPI: Gail [name] presents for video visit  preparing for  TKA surgery and has some baseline constipation irregularity  using   Miralax  like sludge  utd on colon and  advice from gi but still issues and  concern that pain meds  post op will make thinks worse . Asks for advice  No uti sx but maybe had blood ua  had a miniperiood peri menopause   ok today  ( has urologist that evaluated  blood in urine and idiopathic?)  on  anticoagulant   Seeing counselor for stress and helpful  also suggested trya mg supp for sleep maybe ROS: See pertinent positives and negatives per HPI.  Past Medical History:  Diagnosis Date   Anticoagulant long-term use    xarelto --- managed by dr g. taylor   Anxiety    Chronic pain of both shoulders    Dysrhythmia    Family history of adverse reaction to anesthesia    mother--- ponv   Heart murmur    History of anal fissures    History of infertility, female    History of kidney stones    Hyperlipidemia    Hypertension    IBS (irritable bowel syndrome)    Left ureteral stone    OA (osteoarthritis)    PAF (paroxysmal atrial fibrillation) (HCC)    PCO (polycystic ovaries)    PONV (postoperative nausea and vomiting)    RLS (restless legs syndrome)    Rosacea    Type 2 diabetes mellitus (HCC)     Past Surgical History:  Procedure Laterality Date   A-FLUTTER ABLATION N/A 07/16/2019   Procedure: A-FLUTTER ABLATION;  Surgeon: Waddell Danelle ORN, MD;  Location: [facility];  Service: Cardiovascular;  Laterality: N/A;   ATRIAL TACH ABLATION N/A 07/16/2019   Procedure: ATRIAL TACH ABLATION;  Surgeon: Waddell Danelle ORN, MD;  Location: [facility];  Service: Cardiovascular;  Laterality: N/A;   CARPAL TUNNEL RELEASE Bilateral 2006   HAND TENDON SURGERY Bilateral    bilateral thumb's   INTRAUTERINE DEVICE (IUD) INSERTION     KNEE ARTHROSCOPY Left 07/08/2010   @[facility]  by dr graves   SHOULDER ARTHROSCOPY WITH ROTATOR CUFF REPAIR AND OPEN BICEPS TENODESIS Left 12/21/2021   @[facility]    WISDOM TOOTH EXTRACTION      Family History  Problem Relation Age of Onset   Hypertension Mother    Heart disease Mother    Hypertension Father    ALS Father        deceased   Dementia Maternal Grandfather    Heart disease Paternal Grandmother    Dementia Paternal Grandfather    Hypertension Other    Lung cancer Other        uncle   Breast cancer Other        great aunt   Colon cancer Neg Hx    Stomach cancer Neg Hx    Liver cancer Neg Hx    Rectal cancer Neg Hx    Esophageal cancer Neg Hx  Social History[1]   Current Medications[2]  EXAM: BP Readings from Last 3 Encounters:  10/24/24 112/68  10/10/24 129/66  09/11/24 113/71    VITALS per patient if applicable:  GENERAL: alert, oriented, appears well and in no acute distress  HEENT: atraumatic, conjunttiva clear, no obvious abnormalities on inspection of external nose and ears  NECK: normal movements of the head and neck  LUNGS: on inspection no signs of respiratory distress, breathing rate appears normal, no obvious gross SOB, gasping or wheezing  CV: no obvious cyanosis  PSYCH/NEURO: pleasant and cooperative, no obvious depression or anxiety, speech and thought processing grossly intact Lab Results  Component Value Date   WBC 7.9 04/11/2024   HGB 15.1 (H) 04/11/2024   HCT 44.9 04/11/2024   PLT 257.0 04/11/2024   GLUCOSE 110 (H) 10/18/2024   CHOL 220 (H) 10/18/2024   TRIG 97.0 10/18/2024    HDL 51.80 10/18/2024   LDLDIRECT 160.7 12/10/2010   LDLCALC 148 (H) 10/18/2024   ALT 26 04/11/2024   AST 21 04/11/2024   NA 139 10/18/2024   K 4.2 10/18/2024   CL 104 10/18/2024   CREATININE 0.80 10/18/2024   BUN 20 10/18/2024   CO2 26 10/18/2024   TSH 1.84 04/11/2024   HGBA1C 6.2 10/18/2024    ASSESSMENT AND PLAN:  Discussed the following assessment and plan:    ICD-10-CM   1. Constipation, unspecified constipation type  K59.00    hx of same management    2. Abnormal urine  R82.90     3. Chronic anticoagulation  Z79.01      Ok to use colace stool softener  reg  and and add mag supplement  ok     for sleep and  poss as titrated   (Can use dulcolax supp. if needed )   Has hx of eval for blood  in urine  but if home test of concern for infection and contact urology or us  Counseled.   Expectant management and discussion of plan and treatment with opportunity to ask questions and all were answered. The patient agreed with the plan and demonstrated an understanding of the instructions.   Advised to call back or seek an in-person evaluation if worsening  or having  further concerns  in interim. Return if symptoms worsen or fail to improve, for when planned.    Apolinar Eastern, MD     [1]  Social History Tobacco Use   Smoking status: Former    Current packs/day: 0.00    Average packs/day: 0.5 packs/day for 7.0 years (3.5 ttl pk-yrs)    Types: Cigarettes    Start date: 56    Quit date: 66    Years since quitting: 38.1    Passive exposure: Never   Smokeless tobacco: Never  Vaping Use   Vaping status: Never Used  Substance Use Topics   Alcohol use: No    Alcohol/week: 0.0 standard drinks of alcohol   Drug use: No  [2]  Current Outpatient Medications:    albuterol  (VENTOLIN  HFA) 108 (90 Base) MCG/ACT inhaler, INHALE 2 PUFFS INTO THE LUNGS EVERY 6 HOURS AS NEEDED FOR WHEEZING OR SHORTNESS OF BREATH, Disp: 6.7 g, Rfl: 1   budesonide -formoterol  (SYMBICORT ) 160-4.5  MCG/ACT inhaler, Inhale 2 puffs into the lungs 2 (two) times daily., Disp: 10.2 g, Rfl: 2   diltiazem  (TIAZAC ) 360 MG 24 hr capsule, Take 1 capsule (360 mg total) by mouth daily., Disp: 90 capsule, Rfl: 3   fluticasone  (FLONASE ) 50 MCG/ACT nasal spray,  2 sprays per nostril daily.  MUST HAVE OFFICE VISIT FOR FURTHER REFILLS. (Patient taking differently: as needed. 2 sprays per nostril daily.), Disp: 16 g, Rfl: 0   gabapentin  (NEURONTIN ) 300 MG capsule, SMARTSIG:1 Capsule(s) By Mouth 1-3 Times Daily PRN, Disp: , Rfl:    Lactobacillus (ACIDOPHILUS PO), Take 1 capsule by mouth at bedtime., Disp: , Rfl:    levonorgestrel  (MIRENA , 52 MG,) 20 MCG/24HR IUD, 1 each by Intrauterine route once., Disp: , Rfl:    losartan  (COZAAR ) 50 MG tablet, TAKE 1 TABLET BY MOUTH EVERY DAY, Disp: 90 tablet, Rfl: 2   metFORMIN  (GLUCOPHAGE -XR) 500 MG 24 hr tablet, TAKE 1 TABLET(500 MG) BY MOUTH DAILY WITH BREAKFAST, Disp: 90 tablet, Rfl: 0   metroNIDAZOLE  (METROCREAM ) 0.75 % cream, Apply topically., Disp: , Rfl:    NON FORMULARY, Pt uses a cpap nightly, Disp: , Rfl:    vitamin B-12 (CYANOCOBALAMIN ) 1000 MCG tablet, Take 1,000 mcg by mouth daily., Disp: , Rfl:    XARELTO  20 MG TABS tablet, TAKE 1 TABLET BY MOUTH DAILY WITH SUPPER, Disp: 30 tablet, Rfl: 5   Zinc Oxide 30.6 % CREA, Apply 1 application topically every morning. (Patient taking differently: Apply 1 application  topically every morning. As needed), Disp: , Rfl:    cyclobenzaprine  (FLEXERIL ) 10 MG tablet, Take 10 mg by mouth every 8 (eight) hours as needed. (Patient not taking: Reported on 01/16/2025), Disp: , Rfl:    DULoxetine  (CYMBALTA ) 20 MG capsule, Take 1 capsule (20 mg total) by mouth daily. (Patient not taking: Reported on 01/16/2025), Disp: 30 capsule, Rfl: 2   HYDROcodone  bit-homatropine (HYCODAN) 5-1.5 MG/5ML syrup, Take 5 mLs by mouth every 6 (six) hours as needed for cough. (Patient not taking: Reported on 01/16/2025), Disp: 120 mL, Rfl: 0   HYDROcodone   bit-homatropine (HYCODAN) 5-1.5 MG/5ML syrup, Take 5 mLs by mouth every 4 (four) hours as needed. (Patient not taking: Reported on 01/16/2025), Disp: 240 mL, Rfl: 0   HYDROcodone -acetaminophen  (NORCO/VICODIN) 5-325 MG tablet, 1 tablet every 6 (six) hours as needed. (Patient not taking: Reported on 01/16/2025), Disp: , Rfl:    hydrOXYzine  (ATARAX ) 25 MG tablet, Take 1 tablet (25 mg total) by mouth at bedtime as needed. (Patient not taking: Reported on 01/16/2025), Disp: 30 tablet, Rfl: 1   nystatin  ointment (MYCOSTATIN ), Apply 1 Application topically daily., Disp: 30 g, Rfl: 0   rosuvastatin  (CRESTOR ) 10 MG tablet, Take 1 tablet (10 mg total) by mouth daily. (Patient not taking: Reported on 01/16/2025), Disp: 90 tablet, Rfl: 3   Spacer/Aero-Holding Chambers (AEROCHAMBER PLUS) Device, Use bid with inhalers., Disp: 1 each, Rfl: 0   Spacer/Aero-Holding Chambers DEVI, Inhale 1 Inhalation into the lungs 2 (two) times daily., Disp: 1 Device, Rfl: 0   traMADol (ULTRAM) 50 MG tablet, Take 50 mg by mouth every 12 (twelve) hours as needed. (Patient not taking: Reported on 01/16/2025), Disp: , Rfl:    triamcinolone cream (KENALOG) 0.1 %, APPLY ON THE SKIN BID PRN (Patient not taking: Reported on 01/16/2025), Disp: , Rfl:   "
